# Patient Record
Sex: Female | Born: 1990 | Race: Black or African American | Hispanic: No | Marital: Single | Smoking: Former smoker
Health system: Southern US, Community
[De-identification: ages and names within clinical notes are randomized; demographics above are authoritative.]

## PROBLEM LIST (undated history)

## (undated) ENCOUNTER — Inpatient Hospital Stay (HOSPITAL_COMMUNITY): Payer: Self-pay

## (undated) DIAGNOSIS — T8859XA Other complications of anesthesia, initial encounter: Secondary | ICD-10-CM

## (undated) DIAGNOSIS — F32A Depression, unspecified: Secondary | ICD-10-CM

## (undated) DIAGNOSIS — R000 Tachycardia, unspecified: Secondary | ICD-10-CM

## (undated) DIAGNOSIS — E079 Disorder of thyroid, unspecified: Secondary | ICD-10-CM

## (undated) DIAGNOSIS — E282 Polycystic ovarian syndrome: Secondary | ICD-10-CM

## (undated) DIAGNOSIS — I10 Essential (primary) hypertension: Secondary | ICD-10-CM

## (undated) DIAGNOSIS — G4730 Sleep apnea, unspecified: Secondary | ICD-10-CM

## (undated) DIAGNOSIS — J45909 Unspecified asthma, uncomplicated: Secondary | ICD-10-CM

## (undated) DIAGNOSIS — F419 Anxiety disorder, unspecified: Secondary | ICD-10-CM

## (undated) DIAGNOSIS — J189 Pneumonia, unspecified organism: Secondary | ICD-10-CM

## (undated) DIAGNOSIS — E119 Type 2 diabetes mellitus without complications: Secondary | ICD-10-CM

## (undated) DIAGNOSIS — F329 Major depressive disorder, single episode, unspecified: Secondary | ICD-10-CM

## (undated) HISTORY — PX: TONSILLECTOMY: SUR1361

## (undated) HISTORY — DX: Type 2 diabetes mellitus without complications: E11.9

## (undated) HISTORY — PX: ADENOIDECTOMY: SUR15

## (undated) HISTORY — DX: Polycystic ovarian syndrome: E28.2

## (undated) HISTORY — DX: Disorder of thyroid, unspecified: E07.9

---
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
## 2009-01-05 ENCOUNTER — Ambulatory Visit: Payer: Self-pay | Admitting: Family Medicine

## 2009-01-05 DIAGNOSIS — L538 Other specified erythematous conditions: Secondary | ICD-10-CM | POA: Insufficient documentation

## 2009-01-05 DIAGNOSIS — N644 Mastodynia: Secondary | ICD-10-CM | POA: Insufficient documentation

## 2009-01-05 DIAGNOSIS — F339 Major depressive disorder, recurrent, unspecified: Secondary | ICD-10-CM | POA: Insufficient documentation

## 2009-08-17 ENCOUNTER — Emergency Department (HOSPITAL_COMMUNITY): Admission: EM | Admit: 2009-08-17 | Discharge: 2009-08-17 | Payer: Self-pay | Admitting: Emergency Medicine

## 2009-12-11 ENCOUNTER — Encounter: Payer: Self-pay | Admitting: Family Medicine

## 2010-01-09 ENCOUNTER — Encounter: Payer: Self-pay | Admitting: Family Medicine

## 2010-02-28 NOTE — Miscellaneous (Signed)
Summary: enalapril denied  Clinical Lists Changes Received rx request for enalapril.  As of 12/2008, but was sexually active without contraception.  Will need appt to discuss other options for antihypertensives.  Was also started on Wellbutrin but did not follow up. Will deny enalapril and ask pt to schedule an appt. Sarah Swaziland MD  December 11, 2009 11:17 AM

## 2010-02-28 NOTE — Miscellaneous (Signed)
Summary: enalapril denied  Clinical Lists Changes Receiving continued requests for enalapril.  Last fill was 12/30/08.  Pt must be seen before we can fill this med.  We need to check labs, blood pressure and should also discuss contraception with her before we can fill this medicine. Sarah Swaziland MD  January 09, 2010 8:59 AM

## 2010-03-25 ENCOUNTER — Emergency Department (HOSPITAL_COMMUNITY)
Admission: EM | Admit: 2010-03-25 | Discharge: 2010-03-25 | Disposition: A | Discharge status: Home / Self Care | Payer: BC Managed Care – PPO | Attending: Emergency Medicine | Admitting: Emergency Medicine

## 2010-03-25 ENCOUNTER — Emergency Department (HOSPITAL_COMMUNITY): Payer: BC Managed Care – PPO

## 2010-03-25 DIAGNOSIS — J040 Acute laryngitis: Secondary | ICD-10-CM | POA: Insufficient documentation

## 2010-03-25 DIAGNOSIS — R0989 Other specified symptoms and signs involving the circulatory and respiratory systems: Secondary | ICD-10-CM | POA: Insufficient documentation

## 2010-03-25 DIAGNOSIS — R059 Cough, unspecified: Secondary | ICD-10-CM | POA: Insufficient documentation

## 2010-03-25 DIAGNOSIS — R0602 Shortness of breath: Secondary | ICD-10-CM | POA: Insufficient documentation

## 2010-03-25 DIAGNOSIS — I10 Essential (primary) hypertension: Secondary | ICD-10-CM | POA: Insufficient documentation

## 2010-03-25 DIAGNOSIS — R05 Cough: Secondary | ICD-10-CM | POA: Insufficient documentation

## 2010-03-25 DIAGNOSIS — J3489 Other specified disorders of nose and nasal sinuses: Secondary | ICD-10-CM | POA: Insufficient documentation

## 2010-04-09 ENCOUNTER — Emergency Department (HOSPITAL_COMMUNITY)
Admission: EM | Admit: 2010-04-09 | Discharge: 2010-04-09 | Disposition: A | Discharge status: Home / Self Care | Payer: BC Managed Care – PPO | Attending: Emergency Medicine | Admitting: Emergency Medicine

## 2010-04-09 DIAGNOSIS — M7989 Other specified soft tissue disorders: Secondary | ICD-10-CM | POA: Insufficient documentation

## 2010-04-09 DIAGNOSIS — R609 Edema, unspecified: Secondary | ICD-10-CM | POA: Insufficient documentation

## 2010-04-09 DIAGNOSIS — I10 Essential (primary) hypertension: Secondary | ICD-10-CM | POA: Insufficient documentation

## 2010-04-13 LAB — DIFFERENTIAL
Basophils Relative: 0 % (ref 0–1)
Eosinophils Relative: 0 % (ref 0–5)
Lymphocytes Relative: 26 % (ref 12–46)
Monocytes Absolute: 1.2 10*3/uL — ABNORMAL HIGH (ref 0.1–1.0)
Monocytes Relative: 16 % — ABNORMAL HIGH (ref 3–12)
Neutrophils Relative %: 58 % (ref 43–77)

## 2010-04-13 LAB — POCT I-STAT, CHEM 8
Chloride: 105 mEq/L (ref 96–112)
Creatinine, Ser: 0.9 mg/dL (ref 0.4–1.2)
Glucose, Bld: 98 mg/dL (ref 70–99)
HCT: 42 % (ref 36.0–46.0)
Hemoglobin: 14.3 g/dL (ref 12.0–15.0)
Potassium: 4.1 mEq/L (ref 3.5–5.1)
Sodium: 138 mEq/L (ref 135–145)

## 2010-04-13 LAB — URINE CULTURE
Colony Count: NO GROWTH
Culture: NO GROWTH

## 2010-04-13 LAB — URINALYSIS, ROUTINE W REFLEX MICROSCOPIC
Glucose, UA: NEGATIVE mg/dL
Ketones, ur: NEGATIVE mg/dL
Nitrite: NEGATIVE
Protein, ur: NEGATIVE mg/dL
Urobilinogen, UA: 0.2 mg/dL (ref 0.0–1.0)

## 2010-04-13 LAB — CBC
Hemoglobin: 12.5 g/dL (ref 12.0–15.0)
Platelets: 294 10*3/uL (ref 150–400)
RBC: 5.16 MIL/uL — ABNORMAL HIGH (ref 3.87–5.11)
WBC: 7.2 10*3/uL (ref 4.0–10.5)

## 2010-04-13 LAB — URINE MICROSCOPIC-ADD ON

## 2010-05-03 LAB — CARDIAC PANEL(CRET KIN+CKTOT+MB+TROPI)
CK, MB: 1.1 ng/mL (ref 0.3–4.0)
CK, MB: 1.2 ng/mL (ref 0.3–4.0)
Relative Index: 0.8 (ref 0.0–2.5)
Relative Index: 0.9 (ref 0.0–2.5)
Total CK: 144 U/L (ref 7–177)

## 2010-05-03 LAB — WET PREP, GENITAL
Clue Cells Wet Prep HPF POC: NONE SEEN
Trich, Wet Prep: NONE SEEN

## 2010-05-03 LAB — CBC
HCT: 40 % (ref 36.0–46.0)
Hemoglobin: 12.9 g/dL (ref 12.0–15.0)
MCHC: 31.8 g/dL (ref 30.0–36.0)
MCHC: 32.4 g/dL (ref 30.0–36.0)
MCV: 77.1 fL — ABNORMAL LOW (ref 78.0–100.0)
RDW: 14.5 % (ref 11.5–15.5)
RDW: 14.7 % (ref 11.5–15.5)

## 2010-05-03 LAB — URINALYSIS, ROUTINE W REFLEX MICROSCOPIC
Nitrite: NEGATIVE
Specific Gravity, Urine: 1.023 (ref 1.005–1.030)
Urobilinogen, UA: 0.2 mg/dL (ref 0.0–1.0)
pH: 6.5 (ref 5.0–8.0)

## 2010-05-03 LAB — D-DIMER, QUANTITATIVE: D-Dimer, Quant: 0.45 ug/mL-FEU (ref 0.00–0.48)

## 2010-05-03 LAB — BASIC METABOLIC PANEL
CO2: 24 mEq/L (ref 19–32)
Calcium: 8.3 mg/dL — ABNORMAL LOW (ref 8.4–10.5)
GFR calc Af Amer: >60 mL/min (ref >60)
GFR calc non Af Amer: >60 mL/min (ref >60)
Glucose, Bld: 98 mg/dL (ref 70–99)
Potassium: 3.4 mEq/L — ABNORMAL LOW (ref 3.5–5.1)
Sodium: 133 mEq/L — ABNORMAL LOW (ref 135–145)

## 2010-05-03 LAB — POCT PREGNANCY, URINE: Preg Test, Ur: NEGATIVE

## 2010-05-03 LAB — POCT I-STAT, CHEM 8
Creatinine, Ser: 0.8 mg/dL (ref 0.4–1.2)
Glucose, Bld: 122 mg/dL — ABNORMAL HIGH (ref 70–99)
Hemoglobin: 15 g/dL (ref 12.0–15.0)
Potassium: 4.5 mEq/L (ref 3.5–5.1)

## 2010-05-03 LAB — CK TOTAL AND CKMB (NOT AT ARMC)
Relative Index: 0.6 (ref 0.0–2.5)
Total CK: 174 U/L (ref 7–177)

## 2010-05-03 LAB — DIFFERENTIAL
Basophils Absolute: 0 10*3/uL (ref 0.0–0.1)
Eosinophils Absolute: 0.1 10*3/uL (ref 0.0–0.7)
Eosinophils Relative: 2 % (ref 0–5)
Lymphocytes Relative: 35 % (ref 12–46)
Monocytes Absolute: 0.7 10*3/uL (ref 0.1–1.0)

## 2010-05-03 LAB — URINE MICROSCOPIC-ADD ON

## 2010-05-03 LAB — POCT CARDIAC MARKERS
CKMB, poc: 1.1 ng/mL (ref 1.0–8.0)
Troponin i, poc: <0.05 ng/mL (ref 0.00–0.09)
Troponin i, poc: <0.05 ng/mL (ref 0.00–0.09)

## 2010-05-03 LAB — LIPID PANEL
Cholesterol: 122 mg/dL (ref 0–169)
LDL Cholesterol: 66 mg/dL (ref 0–109)
Triglycerides: 44 mg/dL (ref <150)

## 2010-05-07 LAB — DIFFERENTIAL
Eosinophils Absolute: 0 10*3/uL (ref 0.0–0.7)
Eosinophils Relative: 1 % (ref 0–5)
Lymphocytes Relative: 13 % (ref 12–46)
Lymphs Abs: 0.9 10*3/uL (ref 0.7–4.0)
Monocytes Relative: 9 % (ref 3–12)

## 2010-05-07 LAB — POCT I-STAT, CHEM 8
BUN: 5 mg/dL — ABNORMAL LOW (ref 6–23)
Creatinine, Ser: 0.9 mg/dL (ref 0.4–1.2)
Glucose, Bld: 100 mg/dL — ABNORMAL HIGH (ref 70–99)
Hemoglobin: 13.9 g/dL (ref 12.0–15.0)
TCO2: 23 mmol/L (ref 0–100)

## 2010-05-07 LAB — CBC
HCT: 37.6 % (ref 36.0–46.0)
Hemoglobin: 12.5 g/dL (ref 12.0–15.0)
MCV: 74.9 fL — ABNORMAL LOW (ref 78.0–100.0)
RBC: 5.02 MIL/uL (ref 3.87–5.11)
WBC: 7.4 10*3/uL (ref 4.0–10.5)

## 2010-06-14 NOTE — H&P (Signed)
NAMEINDICA, MARCOTT NO.:  [id number]   MEDICAL RECORD NO.:  [id number]          PATIENT TYPE:  INP   LOCATION:  0103                          [facility]ACILITY:  [facility]   PHYSICIAN:  Lalla Brothers, MDDATE OF BIRTH:  [birth year]   DATE OF ADMISSION:  04/26/2005  DATE OF DISCHARGE:                         PSYCHIATRIC ADMISSION ASSESSMENT   IDENTIFICATION:  This 20 year old female, ninth grade student at [employer], is admitted emergently voluntarily from Access and Intake Crisis at  the [facility] where she was brought by mother for disruptive  behavior and depressed mood and dangerous to the patient and others.  Mother  had contacted the crisis line for advice and, despite constructive  assignments for the interim until next [facility]  appointment, mother brought the patient for further assessment.  The patient  disclosed in the assessment that she was having self-destructive ideation,  progressing to plan to cut her wrist with a knife to commit suicide.  She  did not disclose this to the family until present with the intake counselor  and then did disclose such to mother as well.  The patient has had extremes  of behavior from hopeless giving up to property destruction and shoving her  uncle as well as throwing a chair at her 54-year-old sister.  Although the  family is frightened of the patient, the crisis assessment was frightened  for the patient's self-injurious ideation and intent.   HISTORY OF PRESENT ILLNESS:  The patient has been in therapy with Marcelino Duster  at [facility] for the last month.  She indicates that  they address her loneliness, alienation, and her current obsessive fixation  upon a relationship with a 21 year old female tennant at the residence next  door to the family.  The patient has runaway with this boyfriend figure as  well as running to his residence and then refusing to return home  despite  his insistence.  The patient's first question of me is to ask whether her  boyfriend has been incarcerated.  Apparently, parents may be pursuing  charges for the female's interaction with the patient.  The patient does not  clarify any specific interaction herself.  She does state that she has been  switched from birth control pills to Depo-Provera injections, likely due to  noncompliance and the patient and mother acknowledge that the patient has  been noncompliant the last two nights with her enalapril 5 mg nightly for  hypertension.  The patient is significantly overweight and such along with  other factors has contributed to major medical consequences in the past.  Although tonsillectomy in July of 2002 had hoped to provide the patient  improved upper airway for respiration, the patient apparently experienced  respiratory failure and was air-lifted to [facility] where she states she was in a  coma for two weeks.  The patient may be discussing sedation necessary for  artificial ventilation but she and the family cannot clarify further.  The  patient has weight and other airway dynamic issues that may contribute.  She  was treated for bronchopneumonia in the emergency  department March 26, 2005.  The patient therefore presents numerous risk to pharmacotherapy while  at the same time having significant risk of no treatment.  The patient has  been destroying property at home.  She is threatening others in the home  including with clenched fists, especially as parents prohibit the patient  from seeing the 21 year old man next door.  The patient retaliates and  regresses with suicidality becoming hopeless at the same time that she is  frequently out of control in her behavior.  The patient therefore presents  significant risk in her current mood, agitated behavior and failing family  relations.  She has also had a significant decline in her grades at school  from B's and C's usually to  current F's.  The patient does not acknowledge  definite manic symptoms.  She does not clarify sexual activity status  particularly relative to boyfriend.  The patient is on no medications except  her enalapril and Depo-Provera with enalapril being 5 mg every bedtime.  The  patient reports a 20-pound weight gain recently despite reporting that she  has been eating only one meal daily or less, though this eating pattern  likely predisposes to binge-eating and thereby significant weight gain.  The  patient presents atypical depressive features with hypersensitivity to the  comments or actions of others, rejection sensitivity, easy anger outburst,  overeating in binges, and reactive dysphoria.  The patient presents no  definite manic or psychotic features or diathesis.  She denies the use of  alcohol or illicit drugs.  She has had no other known organic central  nervous system trauma, although she states she was in a coma for two weeks  at [facility] when transferred there for what she considers collapsed lungs  following her tonsillectomy.  The patient does not acknowledge other  specific anxiety nor post-traumatic flashbacks.   PAST MEDICAL HISTORY:  The patient is under the primary care of Dr. Orland Penman  at [facility].  She has longstanding obesity with  significant stria.  She has hypertension, treated currently with enalapril5  mg every bedtime.  She is on Depo-Provera now instead of birth control  pills.  She has mechanical knee pain which may be related to being  overweight.  She had a tonsillectomy in July of 2002, seeming to describe  respiratory failure afterward even though a tonsillectomy was partly for the  indication of improving the upper airway.  The procedure was uneventful  otherwise.  She was in a coma reportedly for two weeks at [facility] which may have  been induced while on mechanical ventilation.  The patient was in the emergency department March 26, 2005 with  complaints of chest pain and  difficulty breathing.  The patient had a chest x-ray at that time that was  largely benign except for some peribronchiolar thickening and apparently was  ultimately concluded to have bronchopneumonia.  Her electrocardiogram  performed at that time as well as a CT scan of the chest apparently revealed  no evidence of pulmonary emboli or other abnormality.  She was treated with  intervenous azithromycin to be followed by oral azithromycin and apparently  did have improvement.  The patient has no medication allergies.  She is on  no other current medical regimen except her enalapril on Depo-Provera.  She  denies seizures or syncope otherwise.  She denies heart murmur or arrhythmia  otherwise.   REVIEW OF SYSTEMS:  The patient denies difficulty with gait, gaze or  continence.  She denies exposure to communicable disease or toxins.  She  denies rash, jaundice or purpura.  There is no chest pain, palpitations or  presyncope.  There is no abdominal pain, nausea, vomiting or diarrhea.  There is no dysuria or arthralgia.  She denies memory difficulties or  coordination difficulties currently.  She has no headache or sensory loss.   IMMUNIZATIONS:  Up-to-date.   FAMILY HISTORY:  The patient lives with both parents and siblings, ages 74  and 41.  She reports support from grandparents.  She perceives that family  may press charges against the 21 year old female living as a tennant in the  residence next door.  The patient and family do not immediately acknowledge  other major psychiatric disorders.   SOCIAL AND DEVELOPMENTAL HISTORY:  The patient is a ninth grade student at  [employer].  High school transition may be stressful.  Grades have  dropped from B's and C's to F's but parents seem to attribute it to her  fixation and obsession with the female next door.  The patient does not  acknowledge alcohol or illicit drug use.  She does not acknowledge  cigarettes.   She denies other legal charges herself.   ASSETS:  The patient can be caring and sensitive.   MENTAL STATUS EXAM:  Height is 65-1/2 inches and weight is 315 pounds.  Blood pressure is 124/83 with heart rate of 100 (sitting).  The patient is  right-handed.  She is alert and oriented with speech intact.  Cranial nerves  2-12 are intact.  Muscle strengths and tone are normal.  AMRs are 0/0.  There are no pathologic reflexes or soft neurologic findings.  There are no  abnormal involuntary movements.  Gait and gaze are intact.  The patient is  most concerned about boyfriend next door becoming incarcerated.  She  acknowledges obsession and fixation with the boyfriend figure but does not  share how or why.  She has significant atypical dysphoria with reactivity to  mood, easy outbursts of anger, impulse dyscontrol and binge overeating, and  hypersensitivity to the comments or actions of others as well as rejection sensitivity.  He has significant secondary oppositional outbursts and  externalization.  She did not present definite impaired concentration or  attention.  She has no misperceptions or delirium.  She has no dissociation  or post-traumatic anxiety.  She has no loose associations or hallucinations.  She has suicide plan to cut her wrist and has been significantly assaultive  and destructive to family and destructive of property.   IMPRESSION:  AXIS I:  Depressive disorder not otherwise specified with  atypical features.  Rule out oppositional defiant disorder (provisional  diagnosis).  Rule out eating disorder not otherwise specified with binge  overeating (provisional diagnosis).  Parent-child problem.  Other specified  family circumstances.  Other interpersonal problem.  AXIS II:  Diagnosis deferred.  AXIS III:  Obesity, hypertension, history of upper airway obstructive  symptoms treated with tonsillectomy, proclivity to respiratory failure and  pneumonia due to above,  Depo-Provera.  AXIS IV:  Stressors:  Family--moderate, acute and chronic; peer relations--  severe to extreme, acute and chronic; school--moderate, acute and chronic;  phase of life--severe, acute and chronic.  AXIS V:  GAF on admission 40; highest in last year estimated at 75.   PLAN:  The patient is admitted for inpatient adolescent psychiatric and  multidisciplinary multimodal behavioral health treatment in a team-based  program at a locked psychiatric unit.  Will be seen for pharmacotherapy if  possible but, if necessary, to use an antidepressant, Wellbutrin or other  activating agent would be essential.  However, will need to monitor blood  pressure and respiratory competence with any medication.  Cognitive  behavioral therapy, anger management, desensitization, family therapy,  object relations therapy, identity consolidation, individuation separation,  and nutrition therapy will be undertaken.  The success of psychotherapy is  important to minimize the medication need and potential side effects.   ESTIMATED LENGTH OF STAY:  Five days with target symptoms for discharge  being stabilization of suicide risk and mood, stabilization of dangerous,  disruptive behavior, and generalization of the capacity for safe, effective  participation in outpatient treatment.      Lalla Brothers, MD  Electronically Signed     GEJ/MEDQ  D:  04/27/2005  T:  04/28/2005  Job:  [id number]

## 2010-06-14 NOTE — Op Note (Signed)
[place]  Patient:    Laura Benson, Laura Benson                     MRN: [id number] Proc. Date: 08/24/00 Attending:  Alfonse Flavors, M.D.                           Operative Report  INDICATIONS/JUSTIFICATIONS FOR PROCEDURE: Laura Benson is a [age]-year-old patient seen in consultation at the request of [facility].  Laura Benson had been seen in the past in March 1998.  At that time she was noted to have tonsillar and adenoid hypertrophy with nocturnal airway obstruction. Tonsillectomy and adenoidectomy was suggested.  Laura Benson continued to have loud snoring at night.  She had choking respirations and intermittent apneic periods.  She was a mouth-breather during the day.  She has obesity.  The physical examination showed 3+ tonsils.  The remainder of the physical examination was normal with the exception of obesity.  Laura Benson was felt to have significant nocturnal airway obstruction.  She was a candidate for tonsillectomy and adenoidectomy, although in view of her obesity she could continue to have nocturnal airway problems.  This was discussed with her mother, who understood that nocturnal airway obstruction could be caused by a number of different factors.  The indications and complications of the procedure were discussed in detail.  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  OPERATION/PROCEDURE: Tonsillectomy and adenoidectomy.  SURGEON: Alfonse Flavors, M.D.  ANESTHESIA: General endotracheal.  DESCRIPTION OF PROCEDURE: Laura Benson was brought to the operating room and placed supine on the operating room table.  She was induced with general anesthesia and intubated with an orotracheal tube.  The face was draped in sterile fashion.  The mouth was opened with a Crowe-Davis mouth gag, the left tonsil grasped with a tenaculum and  retracted medially, and an incision made over the anterior tonsillar pillar with suction cautery, and using a curved Dean knife, curved clamp, and suction cautery the tonsil was dissected free from the tonsillar fossae.  A similar technique was used for removal of the right tonsil.  The tonsillar fossae were debrided with a Kitner dissector and further bleeding areas were cauterized again.  Marcaine 0.50% with 2:100,000 epinephrine was injected circumferentially around the tonsillar fossa, a total of 2 cc of solution used.  The palate was elevated with red rubber catheters under visualization by a mirror, and a large adenoid was removed with adenoid curettes, adenotome, and suction cautery.  Hemostasis was obtained with suction cautery.  The pharynx was suctioned free of debris and a small nasogastric tube passed into the stomach and the gastric contents removed. Laura Benson tolerated the procedure well and was taken to the recovery care unit.  FOLLOW-UP CARE: Laura Benson will be admitted for 23 hour observation for pulmonary edema, tonsillar hemorrhage, and for intravenous hydration and analgesia.  Laura Benson will be seen for follow-up examination in two weeks.  DISCHARGE MEDICATIONS:  1. Amoxicillin.  2. Tylenol with codeine. DD:  08/24/00 TD:  08/24/00 Job: [id number]

## 2010-06-14 NOTE — Discharge Summary (Signed)
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  The patient had not been open with family about these symptoms  prior to arrival but did disclose such in mother's presence as disengagement  from conflicts with family could be temporarily secured.  The patient is  demanding that family not interrupt her relationship with a 21 year old female  who has moved in next door who has the mind of a 21 year old but is  significantly disruptive.  She reports that the 21 year old female has an  uncle who injects heroin and has no other father figure so that she has been  attempting to help his behavior and life at the same time she has fallen in  love with him.  She indicates she has talked with Detective Knott who has  been attempting to intervene and parents are wanting to press charges  against the boyfriend figure.  Whereas the patient sings in the church choir  and is close to the pastor, she has changed significantly to being defiant  and dangerous to others including property destruction, shoving her uncle  and throwing a chair at her 45-year-old sister.  The patient weighs 315  pounds and the family is frightened of her though mother is significantly  heavier than the patient.  For full details please see the typed admission  assessment.   SYNOPSIS OF PRESENT ILLNESS:  The patient has had  outpatient therapy with  Isla Pence at [facility] of the [place] for the last month.  The  patient suggests they work on loneliness, alienation and her current  fixation on the female next door.  The patient has run away with this  boyfriend as well as running to him and refusing to leave him when he  insists.  The patient has been switched from birth control pills to Depo-  Provera injections likely due to noncompliance.  The patient is noncompliant  with her antihypertensive Enalapril 5 mg nightly too often as well.  She had  respiratory failure following tonsillectomy in July2002.  A tonsillectomy  was primarily performed for obstructive airway symptoms.  The patient  indicates that her lungs collapsed but cannot be more specific about the  actual pathophysiology of why she had to be air lifted to [facility] and was in a  coma for 2 weeks.  The patient was treated in the emergency department  March 26, 2005 for bronchopneumonia with negative evaluation at that time  for right heart failure, pulmonary embolus,  or other differential diagnoses.  The patient has not had other symptoms for pulmonary hypertension.  She is  under the primary care of  Dr Alfredo Bach at [facility] family [facility].  Mother  has anxiety.  The patient lives with both parents and sisters ages 57 and  82.  There is maternal family history of diabetes, hypertension and heart  disease.  Paternal grandfather has substance abuse with alcohol.  The  patient's grades have dropped from Bs and Cs currently to [employer].   INITIAL MENTAL STATUS EXAM:  The patient has significant atypical dysphoria  at the time of admission.  She has hypersensitivity to the comments or  actions of others and marked rejection sensitivity.  She has easy outbursts  of anger and impulse dyscontrol including for binge overeating.  Her mood is  reactive with intense dysphoria at times alternating with times of adequate  emotional resource for problem  solving.  However she has become  predominantly fixated in  dysphoria preceding admission.  She is secondary  oppositional outbursts and externalizing destructiveness.  She has no  psychotic symptoms or dissociative symptoms.  She has no manic diathesis  evident clinically.  As she participates in the intake interview and access  and intake crisis, the patient opens up and discloses that she has been  considering suicide with a knife to cut her wrist.   LABORATORY FINDINGS:  CBC was normal except MCV low at 74 with reference  range 78 to 92.  She had a slight monocytosis with 14% monocytes with upper  limit of normal nine and neutrophils were low at 29% with lower limit of  normal 33 so that absolute neutrophil count was 1500 with lower limit of  normal 1600.  White count was normal at 5100, hemoglobin 11.9, reference  range 11-14.6, hematocrit 37 and platelet count 295,000.  Comprehensive  metabolic panel was normal except indirect bilirubin elevated at 1.2 with  upper limit of normal 0.9.  Sodium was normal 141, potassium 3.9, fasting  glucose 98, CO2 24, direct bilirubin 0.3, calcium nine, albumin 3.6, AST 17,  ALT 16 and GGT 23.  10-hour fasting lipid study was normal with total  cholesterol 113, HDL 35, LDL 69 and triglyceride 44.  Free T4 was normal  1.48 and TSH at 0.58.  Serum iron was normal at 40 with reference range 42-  135 but percent saturation was low at 15% with reference range 20-55% with  TIBC 332.  Urine HCG was negative.  Urine drug screen was negative with  creatinine of 344 mg/dL documenting specimen to be adequate.  Urinalysis was  normal except concentrated specimen with specific gravity of 1.031.  RPR was  nonreactive.  Urine probe for gonorrhea and chlamydia trichomatous by DNA  amplification were both negative.   HOSPITAL COURSE AND TREATMENT:  General medical exam by Mallie Darting PA-C  noted previous fractures of the right and left legs.  The patient  reported that she falls asleep in class raising concern for breathing related sleep  disorder.  She has eyeglasses.  She notes some nausea from birth control  pill.  She had menarche at age 84 and menses are regular on birth control  pills.  She has severe obesity.  She is denying any other GYN exam.  Admission height was 65-1/2 inches and weight was 315 pounds.  Initial blood  pressure was 132/85 with heart rate of 83 supine and 157/87 with heart rate  of 90 standing.  Vital signs  were otherwise normal throughout hospital stay,  except standing blood pressure elevated on the day before discharge and  160/92 with heart rate of 93 while supine blood pressure had been 142/68  with heart rate of 81 that day.  On the morning of discharge, supine blood  pressure was 145/78 with heart rate of 84.  Lowest blood pressure was 114/68  with heart rate of 68, three mornings prior to discharge.  The patient's  enalapril was given consistently at 5 mg at bedtime throughout the hospital  stay.  Multivitamin with iron was started and the patient was seen by  nutrition for consultation and education, that could be applied and  implemented during hospital stay and then generalized.  The patient reported  poor eating habits but did express interest in eating more healthily and  losing weight.  General focus was to stop skipping meals and to increase  fruits and milk for dietary history obtained.  The patient was slow to  engage in treatment but did become capable of addressing all issues  including family structure and function, obsessive fixation on boyfriend  next door, and developmental goals and course.  The patient was visited by  pastor as well as family.  The patient wanted to see Detective Knott who was  available to discuss with social work but not to see the patient.  Still  parents and Detective Clydene Pugh were addressing the status of the 21 year old  female next door and how to contain his illegal  behaviors.  Mother in some  ways seemed to expect antidepressant but in other ways acknowledged  noncompliance with antihypertensive.  As the patient gradually began to make  progress in therapy, hope was to abstain from starting antidepressant  considering the patient has had medical complications and multiple arenas  with past treatment.  Weight reduction is most imperative intervention.  The  patient was still controlling in the final family therapy session with  mother and grandmother but gradually worked through Statistician of  accomplishments from inpatient treatment to home.  Still the patient may  benefit from out of home placement if safety cannot be generalized to home  and community.  Wellbutrin would likely be most favorable antidepressant if  needed.  The patient does not have a history of purging or seizures.  Still  the coma she experienced in 2002 is not fully understood.  She is on birth  control pills and anti-hypertensive.  Care management services particularly in structuring aftercare thereby addressed with the patient and family  opportunities for weight reduction camp and program, particularly in the  summer.  The patient's suicidal ideation remitted and she was discharged in  improved condition free of suicidal ideation.  She required no seclusion or  restraint during hospital stay and her aggressiveness gradually subsided so  that she was only resistant by the time of discharge in an oppositional  fashion to mother and grandmother but not aggressive.   FINAL DIAGNOSES:  AXIS I:  1.  Depressive disorder not otherwise specified with atypical features.  2.  Oppositional defiant disorder.  3.  Psychological factors affecting physical condition including obesity      and, hypertension and obstructive respiratory symptoms.  4.  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  Parent child problem.  6.  Other interpersonal problem.  7.  Other  specified family circumstances  8.  Rule out eating disorder not otherwise specified with binge overeating      (provisional diagnosis).  AXIS  II: Diagnosis deferred.  AXIS III:  1.  Obesity.  2.  Hypertension  3.  Respiratory failure of some type following tonsillectomy for obstructive      airway symptoms.  4.  Bronchopneumonia 6 weeks ago.  5.  Depo-Provera  6.  Microcytosis with low iron saturation  AXIS IV: Stressors family moderate acute and chronic; peer relations extreme  acute; school moderate acute; phase of life severe acute and chronic;  medical moderate to severe acute and chronic  AXIS V: Global assessment of functioning on admission 40 with highest in  last year estimated at 75 and discharge global assessment of functioning was  51.   PLAN:  The patient was discharged to mother in improved condition free of  suicidal ideation.  She follows a weight control diet as per nutrition  consult and is considering with the family summer camp and treatment program  for her obesity.  She has no immediate restrictions on activity but has had  problems in the past, particularly when inactive particularly around  surgery.  Crisis and safety plans are outlined if needed.  She is discharged  on her home medications including Enalapril 5 mg every bedtime quantity #30  prescribed, multivitamin with iron daily over-the-counter, and Depo-Provera  already underway.  She continues psychotherapy including with family at  [facility] of the [place] with appointment with Isla Pence on  May 14, 2005 at 1600.  She has ongoing medical care at [facility] family  practice.  Detective Clydene Pugh continues to work with parents at the community  level for protective interventions for [place] from the 21 year old  boyfriend next door, and Fermina is more accepting of these at the time of  discharge.      Lalla Brothers, MD  Electronically Signed     GEJ/MEDQ  D:  05/05/2005  T:   05/05/2005  Job:  [id number]

## 2010-07-23 ENCOUNTER — Inpatient Hospital Stay (INDEPENDENT_AMBULATORY_CARE_PROVIDER_SITE_OTHER)
Admission: RE | Admit: 2010-07-23 | Discharge: 2010-07-23 | Disposition: A | Discharge status: Home / Self Care | Payer: BC Managed Care – PPO | Source: Ambulatory Visit | Attending: Emergency Medicine | Admitting: Emergency Medicine

## 2010-07-23 DIAGNOSIS — N760 Acute vaginitis: Secondary | ICD-10-CM

## 2010-07-23 DIAGNOSIS — A499 Bacterial infection, unspecified: Secondary | ICD-10-CM

## 2010-07-23 DIAGNOSIS — B9689 Other specified bacterial agents as the cause of diseases classified elsewhere: Secondary | ICD-10-CM

## 2010-07-23 LAB — WET PREP, GENITAL

## 2010-07-23 LAB — POCT URINALYSIS DIP (DEVICE)
Ketones, ur: NEGATIVE mg/dL
Nitrite: NEGATIVE
Protein, ur: 100 mg/dL — AB
pH: 5.5 (ref 5.0–8.0)

## 2010-07-24 LAB — URINE CULTURE: Culture  Setup Time: 201206261715

## 2010-07-24 LAB — RPR: RPR Ser Ql: NONREACTIVE

## 2010-07-24 LAB — HIV ANTIBODY (ROUTINE TESTING W REFLEX): HIV: NONREACTIVE

## 2010-07-24 LAB — GC/CHLAMYDIA PROBE AMP, GENITAL
Chlamydia, DNA Probe: POSITIVE — AB
GC Probe Amp, Genital: NEGATIVE

## 2010-07-27 ENCOUNTER — Emergency Department (HOSPITAL_COMMUNITY)
Admission: EM | Admit: 2010-07-27 | Discharge: 2010-07-28 | Disposition: A | Discharge status: Home / Self Care | Payer: BC Managed Care – PPO | Attending: Emergency Medicine | Admitting: Emergency Medicine

## 2010-07-27 DIAGNOSIS — I10 Essential (primary) hypertension: Secondary | ICD-10-CM | POA: Insufficient documentation

## 2010-07-27 DIAGNOSIS — R51 Headache: Secondary | ICD-10-CM | POA: Insufficient documentation

## 2010-07-28 LAB — URINALYSIS, ROUTINE W REFLEX MICROSCOPIC
Nitrite: NEGATIVE
Protein, ur: NEGATIVE mg/dL
Specific Gravity, Urine: 1.025 (ref 1.005–1.030)
Urobilinogen, UA: 0.2 mg/dL (ref 0.0–1.0)

## 2010-07-28 LAB — URINE MICROSCOPIC-ADD ON

## 2011-02-24 ENCOUNTER — Encounter (HOSPITAL_COMMUNITY): Payer: Self-pay | Admitting: *Deleted

## 2011-02-24 ENCOUNTER — Emergency Department (HOSPITAL_COMMUNITY)
Admission: EM | Admit: 2011-02-24 | Discharge: 2011-02-24 | Disposition: A | Discharge status: Home / Self Care | Payer: BC Managed Care – PPO | Attending: Emergency Medicine | Admitting: Emergency Medicine

## 2011-02-24 ENCOUNTER — Emergency Department (HOSPITAL_COMMUNITY): Payer: BC Managed Care – PPO

## 2011-02-24 DIAGNOSIS — J40 Bronchitis, not specified as acute or chronic: Secondary | ICD-10-CM | POA: Insufficient documentation

## 2011-02-24 DIAGNOSIS — R000 Tachycardia, unspecified: Secondary | ICD-10-CM | POA: Insufficient documentation

## 2011-02-24 DIAGNOSIS — R059 Cough, unspecified: Secondary | ICD-10-CM | POA: Insufficient documentation

## 2011-02-24 DIAGNOSIS — R0602 Shortness of breath: Secondary | ICD-10-CM | POA: Insufficient documentation

## 2011-02-24 DIAGNOSIS — G43909 Migraine, unspecified, not intractable, without status migrainosus: Secondary | ICD-10-CM | POA: Insufficient documentation

## 2011-02-24 DIAGNOSIS — R05 Cough: Secondary | ICD-10-CM | POA: Insufficient documentation

## 2011-02-24 HISTORY — DX: Essential (primary) hypertension: I10

## 2011-02-24 MED ORDER — BENZONATATE 100 MG PO CAPS
100.0000 mg | ORAL_CAPSULE | Freq: Three times a day (TID) | ORAL | Status: DC | PRN
  Administered 2011-02-24: 100 mg via ORAL
  Filled 2011-02-24: qty 1

## 2011-02-24 MED ORDER — PROMETHAZINE HCL 25 MG/ML IJ SOLN
25.0000 mg | Freq: Once | INTRAMUSCULAR | Status: AC
  Administered 2011-02-24: 25 mg via INTRAVENOUS
  Filled 2011-02-24: qty 1

## 2011-02-24 MED ORDER — KETOROLAC TROMETHAMINE 60 MG/2ML IM SOLN
60.0000 mg | Freq: Once | INTRAMUSCULAR | Status: AC
  Administered 2011-02-24: 60 mg via INTRAMUSCULAR
  Filled 2011-02-24: qty 2

## 2011-02-24 MED ORDER — PROMETHAZINE HCL 25 MG/ML IJ SOLN
25.0000 mg | Freq: Once | INTRAMUSCULAR | Status: AC
  Administered 2011-02-24: 25 mg via INTRAMUSCULAR
  Filled 2011-02-24: qty 1

## 2011-02-24 NOTE — ED Provider Notes (Cosign Needed)
History     CSN: [id number]  Arrival date & time 02/24/11  1016   First MD Initiated Contact with Patient 02/24/11 1044      Chief Complaint  Patient presents with  . URI  . Headache    (Consider location/radiation/quality/duration/timing/severity/associated sxs/prior treatment) Patient is a 21 y.o. female presenting with URI and headaches. The history is provided by the patient.  URI The primary symptoms include headaches, sore throat, cough and myalgias. Primary symptoms do not include fever, abdominal pain, nausea, vomiting or rash.  The headache is associated with photophobia. The headache is not associated with weakness.  The myalgias are not associated with weakness.  Symptoms associated with the illness include congestion. The illness is not associated with chills or rhinorrhea.  Headache  Associated symptoms include shortness of breath. Pertinent negatives include no fever, no palpitations, no nausea and no vomiting.   the patient is a 21 year old morbidly obese female, with a history of hypertension.  She presents to the emergency department complaining of nasal congestion, sore throat, productive cough with yellow sputum, and shortness of breath.  She denies nausea, vomiting, fevers, chills.  She does have myalgias.  She also complains of a throbbing left-sided headache with photophobia.  She denies fever, rash, neck pain, weakness, or paresthesias.  She does not smoke cigarettes.  Past Medical History  Diagnosis Date  . Hypertension     Past Surgical History  Procedure Date  . Tonsillectomy   . Adenoidectomy     No family history on file.  History  Substance Use Topics  . Smoking status: Former Games developer  . Smokeless tobacco: Not on file  . Alcohol Use: Yes     rarely    OB History    Grav Para Term Preterm Abortions TAB SAB Ect Mult Living                  Review of Systems  Constitutional: Negative for fever and chills.  HENT: Positive for congestion  and sore throat. Negative for rhinorrhea.   Eyes: Positive for photophobia.  Respiratory: Positive for cough and shortness of breath.   Cardiovascular: Negative for chest pain and palpitations.  Gastrointestinal: Negative for nausea, vomiting and abdominal pain.  Genitourinary: Negative for dysuria.  Musculoskeletal: Positive for myalgias.  Skin: Negative for rash.  Neurological: Positive for headaches. Negative for weakness and light-headedness.  Psychiatric/Behavioral: Negative for confusion.  All other systems reviewed and are negative.    Allergies  Review of patient's allergies indicates no known allergies.  Home Medications   Current Outpatient Rx  Name Route Sig Dispense Refill  . CLOTRIMAZOLE-BETAMETHASONE 1-0.05 % EX CREA  Apply under breasts daily, 60 GM       Pulse 111  Temp(Src) 98.7 F (37.1 C) (Oral)  Resp 20  Wt 435 lb (197.315 kg)  SpO2 100%  LMP 01/01/2011  Physical Exam  Constitutional: She is oriented to person, place, and time. No distress.       Morbidly obese  HENT:  Head: Normocephalic and atraumatic.  Mouth/Throat: Oropharynx is clear and moist. No oropharyngeal exudate.  Eyes: Conjunctivae and EOM are normal. Pupils are equal, round, and reactive to light.  Neck: Normal range of motion. Neck supple.       No nuchal rigidity  Cardiovascular: Regular rhythm.   No murmur heard.      Tachycardia  Pulmonary/Chest: Effort normal and breath sounds normal.  Abdominal: Soft. Bowel sounds are normal. There is no tenderness.  Musculoskeletal: Normal  range of motion.  Lymphadenopathy:    She has no cervical adenopathy.  Neurological: She is alert and oriented to person, place, and time. No cranial nerve deficit. Coordination normal.  Skin: Skin is warm and dry. No rash noted.  Psychiatric: She has a normal mood and affect. Judgment and thought content normal.    ED Course  Procedures (including critical care time) 21 year old morbidly obese female,  with migraine and URI type symptoms.  We will treat her with promethazine, and Toradol for her headache, now, perform a chest x-ray, since she does have a productive cough.  Labs Reviewed - No data to display No results found.   No diagnosis found.  1:23 PM Migraine, resolved.  MDM  Migraine headache- no signs of systemic illness.  CNS infection.  Neurological deficit or altered mental status.  Headache, resolved in the emergency department. Bronchitis.  No respiratory distress, hypoxia or pneumonia        Nicholes Stairs, MD 02/24/11 1323

## 2011-02-24 NOTE — ED Notes (Signed)
Patient transported to X-ray 

## 2011-02-24 NOTE — ED Notes (Signed)
Pt states "I've been having the headache, sore throat and hurting all over x 1 wk, I thought I was getting better"

## 2011-09-25 ENCOUNTER — Encounter (HOSPITAL_COMMUNITY): Payer: Self-pay

## 2011-09-25 ENCOUNTER — Emergency Department (HOSPITAL_COMMUNITY)
Admission: EM | Admit: 2011-09-25 | Discharge: 2011-09-26 | Disposition: A | Discharge status: Home / Self Care | Payer: BC Managed Care – PPO | Attending: Emergency Medicine | Admitting: Emergency Medicine

## 2011-09-25 DIAGNOSIS — Z87891 Personal history of nicotine dependence: Secondary | ICD-10-CM | POA: Insufficient documentation

## 2011-09-25 DIAGNOSIS — I10 Essential (primary) hypertension: Secondary | ICD-10-CM | POA: Insufficient documentation

## 2011-09-25 DIAGNOSIS — R071 Chest pain on breathing: Secondary | ICD-10-CM | POA: Insufficient documentation

## 2011-09-25 DIAGNOSIS — R0781 Pleurodynia: Secondary | ICD-10-CM

## 2011-09-25 LAB — CBC
HCT: 38.7 % (ref 36.0–46.0)
Hemoglobin: 12.7 g/dL (ref 12.0–15.0)
MCH: 24.1 pg — ABNORMAL LOW (ref 26.0–34.0)
MCHC: 32.8 g/dL (ref 30.0–36.0)
MCV: 73.4 fL — ABNORMAL LOW (ref 78.0–100.0)
RDW: 14.6 % (ref 11.5–15.5)

## 2011-09-25 LAB — BASIC METABOLIC PANEL
BUN: 9 mg/dL (ref 6–23)
Creatinine, Ser: 0.91 mg/dL (ref 0.50–1.10)
GFR calc non Af Amer: 90 mL/min — ABNORMAL LOW (ref >90)
Glucose, Bld: 99 mg/dL (ref 70–99)
Potassium: 3.6 mEq/L (ref 3.5–5.1)

## 2011-09-25 LAB — URINALYSIS, ROUTINE W REFLEX MICROSCOPIC
Bilirubin Urine: NEGATIVE
Glucose, UA: NEGATIVE mg/dL
Hgb urine dipstick: NEGATIVE
Nitrite: NEGATIVE
Specific Gravity, Urine: 1.036 — ABNORMAL HIGH (ref 1.005–1.030)
pH: 5.5 (ref 5.0–8.0)

## 2011-09-25 LAB — POCT I-STAT TROPONIN I

## 2011-09-25 LAB — POCT PREGNANCY, URINE: Preg Test, Ur: NEGATIVE

## 2011-09-25 MED ORDER — AMOXICILLIN-POT CLAVULANATE 875-125 MG PO TABS: 1.0000 | ORAL_TABLET | Freq: Once | ORAL | Status: DC

## 2011-09-25 NOTE — ED Provider Notes (Signed)
History     CSN: [id number]  Arrival date & time 09/25/11  Laura Benson   First MD Initiated Contact with Patient 09/25/11 2320      Chief Complaint  Patient presents with  . Chest Pain  . Abdominal Pain    (Consider location/radiation/quality/duration/timing/severity/associated sxs/prior treatment) HPI Comments: 21 year old female history of morbid obesity and hypertension who presents with a complaint of 2 weeks of a sharp chest pain which is worse at night, worse in the recumbent position and as of today became worse with deep breathing. It is sharp and stabbing, mid chest to the left side, associated with radiation to the back. She denies nausea vomiting shortness of breath cough fevers chills swelling of her legs rashes or diarrhea. She has no recent travel, no trauma, no recent surgery, she is not take hormone therapy. She has no history of hypercoagulable state nor has she ever had a venous thromboembolism.  Patient is a 21 y.o. female presenting with chest pain and abdominal pain. The history is provided by the patient.  Chest Pain Primary symptoms include abdominal pain. Pertinent negatives for primary symptoms include no fever, no shortness of breath, no cough, no nausea and no vomiting.    Abdominal Pain The primary symptoms of the illness include abdominal pain. The primary symptoms of the illness do not include fever, shortness of breath, nausea, vomiting, diarrhea or dysuria.  Additional symptoms associated with the illness include back pain. Symptoms associated with the illness do not include chills.    Past Medical History  Diagnosis Date  . Hypertension     Past Surgical History  Procedure Date  . Tonsillectomy   . Adenoidectomy     History reviewed. No pertinent family history.  History  Substance Use Topics  . Smoking status: Former Games developer  . Smokeless tobacco: Not on file  . Alcohol Use: Yes     rarely    OB History    Grav Para Term Preterm Abortions TAB  SAB Ect Mult Living                  Review of Systems  Constitutional: Negative for fever and chills.  HENT: Negative for mouth sores and neck pain.   Eyes: Negative for visual disturbance.  Respiratory: Negative for cough and shortness of breath.   Cardiovascular: Positive for chest pain.  Gastrointestinal: Positive for abdominal pain. Negative for nausea, vomiting and diarrhea.  Genitourinary: Negative for dysuria.  Musculoskeletal: Positive for back pain. Negative for joint swelling.  Skin: Negative for rash.  Neurological: Negative for headaches.  Hematological: Negative for adenopathy.    Allergies  Review of patient's allergies indicates no known allergies.  Home Medications   Current Outpatient Rx  Name Route Sig Dispense Refill  . NAPROXEN 500 MG PO TABS Oral Take 1 tablet (500 mg total) by mouth 2 (two) times daily with a meal. 30 tablet 0    BP 151/92  Pulse 103  Temp 98.4 F (36.9 C) (Oral)  Resp 20  SpO2 99%  LMP 06/19/2011  Physical Exam  Constitutional: She appears well-developed and well-nourished. No distress.  HENT:  Head: Normocephalic and atraumatic.  Mouth/Throat: Oropharynx is clear and moist.  Eyes: Conjunctivae are normal. No scleral icterus.  Neck: Normal range of motion. Neck supple. No thyromegaly present.  Cardiovascular: Normal rate, regular rhythm, normal heart sounds and intact distal pulses.   No murmur heard.      No JVD, strong pulses at radial arteries  Pulmonary/Chest: Effort  normal and breath sounds normal. No respiratory distress. She has no wheezes. She has no rales.  Abdominal: Soft. Bowel sounds are normal. She exhibits no distension. There is no tenderness. There is no rebound.  Musculoskeletal: Normal range of motion. She exhibits no edema and no tenderness.  Lymphadenopathy:    She has no cervical adenopathy.  Neurological: She is alert. Coordination normal.  Skin: Skin is warm and dry. No rash noted. She is not  diaphoretic. No erythema.    ED Course  Procedures (including critical care time)  Labs Reviewed  CBC - Abnormal; Notable for the following:    RBC 5.27 (*)     MCV 73.4 (*)     MCH 24.1 (*)     All other components within normal limits  BASIC METABOLIC PANEL - Abnormal; Notable for the following:    GFR calc non Af Amer 90 (*)     All other components within normal limits  URINALYSIS, ROUTINE W REFLEX MICROSCOPIC - Abnormal; Notable for the following:    APPearance CLOUDY (*)     Specific Gravity, Urine 1.036 (*)     Ketones, ur TRACE (*)     All other components within normal limits  POCT I-STAT TROPONIN I  POCT PREGNANCY, URINE  D-DIMER, QUANTITATIVE   Dg Chest 2 View  09/26/2011  *RADIOLOGY REPORT*  Clinical Data: Chest pain.  CHEST - 2 VIEW  Comparison: PA and lateral chest 02/24/2011.  Findings: Lungs are clear.  Heart size is normal.  No pneumothorax or pleural fluid.  IMPRESSION: Negative chest.   Original Report Authenticated By: Bernadene Bell. D'ALESSIO, M.D.      1. Pleuritic chest pain       MDM  Pt has CP which has been present for 2 weeks but much worse There is no acute findings on ECG though there is non specific T wave changes.  She has been a smoker and is morbidly obese with a sedentary lifestyle and thus I would consider pulmonary embolism. She would be low risk thus I will get a d-dimer.  ED ECG REPORT  I personally interpreted this EKG   Date: 09/26/2011   Rate: 96  Rhythm: normal sinus rhythm  QRS Axis: normal  Intervals: normal  ST/T Wave abnormalities: nonspecific T wave changes  Conduction Disutrbances:none  Narrative Interpretation:   Old EKG Reviewed: none available  D-dimer negative, patient stable for discharge, doubt cardiac source.    Laura Roller, MD 09/26/11 [phone]

## 2011-09-25 NOTE — ED Notes (Signed)
Miller, MD at bedside.  

## 2011-09-25 NOTE — ED Notes (Signed)
Pt states chest pain started today.  Abdominal pain x 2 weeks that radiates to back.  Similar to previous episode of chest pain.  Vitals stable

## 2011-09-26 ENCOUNTER — Emergency Department (HOSPITAL_COMMUNITY): Payer: BC Managed Care – PPO

## 2011-09-26 LAB — D-DIMER, QUANTITATIVE: D-Dimer, Quant: 0.31 ug/mL-FEU (ref 0.00–0.48)

## 2011-09-26 MED ORDER — NAPROXEN 500 MG PO TABS: 500.0000 mg | ORAL_TABLET | Freq: Two times a day (BID) | ORAL | Status: DC

## 2011-09-26 NOTE — ED Notes (Signed)
Patient transported to X-ray 

## 2012-02-03 ENCOUNTER — Encounter (HOSPITAL_COMMUNITY): Payer: Self-pay

## 2012-02-03 ENCOUNTER — Emergency Department (HOSPITAL_COMMUNITY)
Admission: EM | Admit: 2012-02-03 | Discharge: 2012-02-03 | Disposition: A | Discharge status: Home / Self Care | Payer: BC Managed Care – PPO | Attending: Emergency Medicine | Admitting: Emergency Medicine

## 2012-02-03 DIAGNOSIS — R3589 Other polyuria: Secondary | ICD-10-CM | POA: Insufficient documentation

## 2012-02-03 DIAGNOSIS — I10 Essential (primary) hypertension: Secondary | ICD-10-CM | POA: Insufficient documentation

## 2012-02-03 DIAGNOSIS — R112 Nausea with vomiting, unspecified: Secondary | ICD-10-CM | POA: Insufficient documentation

## 2012-02-03 DIAGNOSIS — R358 Other polyuria: Secondary | ICD-10-CM | POA: Insufficient documentation

## 2012-02-03 DIAGNOSIS — R350 Frequency of micturition: Secondary | ICD-10-CM | POA: Insufficient documentation

## 2012-02-03 DIAGNOSIS — F17200 Nicotine dependence, unspecified, uncomplicated: Secondary | ICD-10-CM | POA: Insufficient documentation

## 2012-02-03 DIAGNOSIS — R197 Diarrhea, unspecified: Secondary | ICD-10-CM | POA: Insufficient documentation

## 2012-02-03 DIAGNOSIS — E669 Obesity, unspecified: Secondary | ICD-10-CM | POA: Insufficient documentation

## 2012-02-03 DIAGNOSIS — N926 Irregular menstruation, unspecified: Secondary | ICD-10-CM | POA: Insufficient documentation

## 2012-02-03 LAB — CBC WITH DIFFERENTIAL/PLATELET
Eosinophils Relative: 2 % (ref 0–5)
MCH: 23.7 pg — ABNORMAL LOW (ref 26.0–34.0)
Monocytes Absolute: 0.5 10*3/uL (ref 0.1–1.0)
Neutrophils Relative %: 53 % (ref 43–77)
Platelets: 311 10*3/uL (ref 150–400)
RBC: 5.45 MIL/uL — ABNORMAL HIGH (ref 3.87–5.11)
WBC: 5.8 10*3/uL (ref 4.0–10.5)

## 2012-02-03 LAB — COMPREHENSIVE METABOLIC PANEL
ALT: 20 U/L (ref 0–35)
AST: 18 U/L (ref 0–37)
CO2: 28 mEq/L (ref 19–32)
Calcium: 9.2 mg/dL (ref 8.4–10.5)
GFR calc non Af Amer: >90 mL/min (ref >90)
Sodium: 136 mEq/L (ref 135–145)
Total Protein: 7.5 g/dL (ref 6.0–8.3)

## 2012-02-03 LAB — URINALYSIS, ROUTINE W REFLEX MICROSCOPIC
Bilirubin Urine: NEGATIVE
Glucose, UA: NEGATIVE mg/dL
Hgb urine dipstick: NEGATIVE
Specific Gravity, Urine: 1.019 (ref 1.005–1.030)
pH: 6.5 (ref 5.0–8.0)

## 2012-02-03 NOTE — ED Notes (Signed)
Pt c/o constant lower abdominal pain for past [redacted] weeks along with hourly urination without any smell or burning sensation.  Similar s/s occurred  In Oct. 2013.  Pt reports being unable to make it to the restroom in time at night.   Pt took a pregnancy test in Oct '13 and 2 weeks ago- all tests resulted negative.  Reports nausea, 2 episodes of vomiting and 3 episodes of diarrhea today.  Pt reports being unable to eat a whole meal, fatigue, abdominal cramping x1 and sharp intermittent breast pain. LMP was Sept. 26, 2013.

## 2012-02-03 NOTE — ED Notes (Signed)
Pt states for the past 3 weeks she's had urinary freq, denies burning, sharp lower intermittent abdominal pains, 3 episodes of diarrhea today, nausea and vomiting when she smells coffee at work. Pt states last period was in September, states sometimes she has irregular periods and sometimes they are on time, pt has taken multiple pregnancy tests and have been negative but states her mother is obese also and she did not find out she was pregnant until 5 months. Pt states she thinks she has diabetes.

## 2012-02-03 NOTE — ED Provider Notes (Signed)
History     CSN: [id number]  Arrival date & time 02/03/12  7829   First MD Initiated Contact with Patient 02/03/12 2104      Chief Complaint  Patient presents with  . Abdominal Pain    Lower abdominal pain    (Consider location/radiation/quality/duration/timing/severity/associated sxs/prior treatment) HPI Comments: Ms. Cuthrell presents ambulatory for evaluation of an increased urinary frequency and urgency over the last 2 months.  Her mother is a type II diabetic and she is concerned she may also be developing diabetes.  She reports being uncomfortable with her lifelong obesity.  She has considering programs to help her with weight loss and making changes to her diet and lifestyle.  She denies fever, NVD, hematuria, pyuria, flank/back pain, abnormal discharges, and vaginal bleeding.  She reports having irregular periods and states she has not had one since 08/2011.  She reports taking pregnancy tests 3 times over the last month and all have been negative.  Patient is a 22 y.o. female presenting with abdominal pain. The history is provided by the patient. No language interpreter was used.  Abdominal Pain The primary symptoms of the illness include abdominal pain. The primary symptoms of the illness do not include fever, fatigue, shortness of breath, nausea, vomiting, diarrhea, hematemesis, hematochezia or dysuria. The onset of the illness was gradual. The problem has not changed since onset. The patient states that she believes she is currently not pregnant. The patient has not had a change in bowel habit. Additional symptoms associated with the illness include urgency and frequency. Symptoms associated with the illness do not include chills, anorexia, diaphoresis, heartburn, constipation, hematuria or back pain.    Past Medical History  Diagnosis Date  . Hypertension     Past Surgical History  Procedure Date  . Tonsillectomy   . Adenoidectomy     Family History  Problem Relation Age  of Onset  . Hypertension Mother   . Diabetes Mother     History  Substance Use Topics  . Smoking status: Current Some Day Smoker  . Smokeless tobacco: Never Used  . Alcohol Use: Yes     Comment: rarely, special occasions    OB History    Grav Para Term Preterm Abortions TAB SAB Ect Mult Living                  Review of Systems  Constitutional: Negative for fever, chills, diaphoresis and fatigue.  Respiratory: Negative for shortness of breath.   Gastrointestinal: Positive for abdominal pain. Negative for heartburn, nausea, vomiting, diarrhea, constipation, hematochezia, anorexia and hematemesis.  Genitourinary: Positive for urgency and frequency. Negative for dysuria and hematuria.  Musculoskeletal: Negative for back pain.  All other systems reviewed and are negative.    Allergies  Review of patient's allergies indicates no known allergies.  Home Medications  No current outpatient prescriptions on file.  BP 149/84  Pulse 76  Temp 99 F (37.2 C)  Resp 20  SpO2 100%  LMP 10/23/2011  Physical Exam  Nursing note and vitals reviewed. Constitutional: She is oriented to person, place, and time. She appears well-developed and well-nourished. No distress.       Pt is morbidly obese  HENT:  Head: Normocephalic and atraumatic.  Right Ear: External ear normal.  Left Ear: External ear normal.  Nose: Nose normal.  Mouth/Throat: Oropharynx is clear and moist. No oropharyngeal exudate.  Eyes: Conjunctivae normal are normal. Pupils are equal, round, and reactive to light. Right eye exhibits no discharge. Left  eye exhibits no discharge. No scleral icterus.  Neck: Normal range of motion. Neck supple. No JVD present. No tracheal deviation present.  Cardiovascular: Normal rate, regular rhythm, normal heart sounds and intact distal pulses.  Exam reveals no gallop and no friction rub.   No murmur heard. Pulmonary/Chest: Effort normal and breath sounds normal. No stridor. No  respiratory distress. She has no wheezes. She has no rales. She exhibits no tenderness.  Abdominal: Soft. Bowel sounds are normal. She exhibits no distension and no mass. There is no tenderness. There is no rebound and no guarding.  Musculoskeletal: Normal range of motion. She exhibits no edema and no tenderness.  Lymphadenopathy:    She has no cervical adenopathy.  Neurological: She is alert and oriented to person, place, and time. No cranial nerve deficit.  Skin: Skin is warm and dry. No rash noted. She is not diaphoretic. No erythema. No pallor.  Psychiatric: She has a normal mood and affect. Her behavior is normal.    ED Course  Procedures (including critical care time)  Labs Reviewed  CBC WITH DIFFERENTIAL - Abnormal; Notable for the following:    RBC 5.45 (*)     MCV 73.9 (*)     MCH 23.7 (*)     All other components within normal limits  COMPREHENSIVE METABOLIC PANEL - Abnormal; Notable for the following:    Glucose, Bld 119 (*)     All other components within normal limits  URINALYSIS, ROUTINE W REFLEX MICROSCOPIC - Abnormal; Notable for the following:    APPearance CLOUDY (*)     All other components within normal limits   No results found.   No diagnosis found.    MDM  Pt presents for evaluation of frequent urination.  She is concerned she may have type II diabetes.  She appears nontoxic, NAD.  Routine labs obtained during the triage process are essentially normal.  She has no anemia or leukocytosis.  BMP and LFTs are wnl.  The urinalysis demonstrates no evidence of infection and there was no noted glucose in the urine.  Pt appears comfortable and has no abdominal discomfort.  Plan discharge home to follow-up closely with aprimary physician/gyn        Tobin Chad, MD 02/03/12 2251

## 2012-02-03 NOTE — ED Notes (Signed)
Attempted IV access x2, unsuccessful.

## 2012-05-06 ENCOUNTER — Emergency Department (HOSPITAL_COMMUNITY)
Admission: EM | Admit: 2012-05-06 | Discharge: 2012-05-06 | Disposition: A | Discharge status: Home / Self Care | Payer: BC Managed Care – PPO | Attending: Emergency Medicine | Admitting: Emergency Medicine

## 2012-05-06 ENCOUNTER — Encounter (HOSPITAL_COMMUNITY): Payer: Self-pay | Admitting: Family Medicine

## 2012-05-06 DIAGNOSIS — N898 Other specified noninflammatory disorders of vagina: Secondary | ICD-10-CM | POA: Insufficient documentation

## 2012-05-06 DIAGNOSIS — I10 Essential (primary) hypertension: Secondary | ICD-10-CM | POA: Insufficient documentation

## 2012-05-06 DIAGNOSIS — N920 Excessive and frequent menstruation with regular cycle: Secondary | ICD-10-CM | POA: Insufficient documentation

## 2012-05-06 DIAGNOSIS — F17200 Nicotine dependence, unspecified, uncomplicated: Secondary | ICD-10-CM | POA: Insufficient documentation

## 2012-05-06 DIAGNOSIS — F430 Acute stress reaction: Secondary | ICD-10-CM | POA: Insufficient documentation

## 2012-05-06 DIAGNOSIS — N921 Excessive and frequent menstruation with irregular cycle: Secondary | ICD-10-CM

## 2012-05-06 DIAGNOSIS — Z3202 Encounter for pregnancy test, result negative: Secondary | ICD-10-CM | POA: Insufficient documentation

## 2012-05-06 DIAGNOSIS — R109 Unspecified abdominal pain: Secondary | ICD-10-CM | POA: Insufficient documentation

## 2012-05-06 LAB — CBC
Hemoglobin: 13 g/dL (ref 12.0–15.0)
MCH: 23.9 pg — ABNORMAL LOW (ref 26.0–34.0)
MCV: 74 fL — ABNORMAL LOW (ref 78.0–100.0)
RBC: 5.43 MIL/uL — ABNORMAL HIGH (ref 3.87–5.11)
WBC: 6.8 10*3/uL (ref 4.0–10.5)

## 2012-05-06 LAB — URINALYSIS, ROUTINE W REFLEX MICROSCOPIC
Glucose, UA: NEGATIVE mg/dL
Specific Gravity, Urine: 1.021 (ref 1.005–1.030)

## 2012-05-06 LAB — WET PREP, GENITAL
Trich, Wet Prep: NONE SEEN
Yeast Wet Prep HPF POC: NONE SEEN

## 2012-05-06 LAB — PREGNANCY, URINE: Preg Test, Ur: NEGATIVE

## 2012-05-06 LAB — URINE MICROSCOPIC-ADD ON

## 2012-05-06 MED ORDER — NAPROXEN 500 MG PO TABS: 500.0000 mg | ORAL_TABLET | Freq: Two times a day (BID) | ORAL | Status: DC

## 2012-05-06 MED ORDER — NAPROXEN 500 MG PO TABS
500.0000 mg | ORAL_TABLET | Freq: Once | ORAL | Status: AC
  Administered 2012-05-06: 500 mg via ORAL
  Filled 2012-05-06: qty 1

## 2012-05-06 NOTE — ED Notes (Signed)
Patient states that she has had lower abdominal pain and vaginal bleeding for approx 2 weeks. States that she is bleeding thru her clothes and changing pads every 3 hours. States she's also passing clots.

## 2012-05-06 NOTE — ED Notes (Signed)
Pt states she has had vaginal bleeding x 2 weeks. Pt states she does not have regular periods, that she only has a period every 5 months or so. Pt also c/o LLQ abd pain x 1 week.

## 2012-05-06 NOTE — ED Provider Notes (Signed)
History     CSN: [id number]  Arrival date & time 05/06/12  0022   First MD Initiated Contact with Patient 05/06/12 0118      Chief Complaint  Patient presents with  . Abdominal Pain  . Vaginal Bleeding    (Consider location/radiation/quality/duration/timing/severity/associated sxs/prior treatment) HPI Laura Benson is a 22 y.o. female who is presenting with lower abdominal pain, bilaterally, she points to the region just superior to the inguinal ligaments bilaterally, she says this is crampy, intermittent, she says she typically doesn't like to take medicine and so hasn't taken anything for it, she's having increased vaginal bleeding with clots. She says she's changing pads about every 3 hours. He says the cramping is moderate to severe, 8/10 in intensity.  Patient has a history of irregular menstrual periods and typically is having periods about every 5 months however her last "normal" period was in February and then she got another period on 04/25/2012 and is still having that period now.  Patient is followed with OB/GYN at [facility] in the past. She is currently sexually active with her boyfriend, denies any vaginal discharge vaginal itching.  Denies nausea vomiting or diarrhea. Denies dysuria, endorses frequency. No polyuria or polydipsia.  Patient is had some increased stress recently, she has 2 jobs, and when she feels like she's not getting enough hours in the other she feels like she's on her feet too long which is about 10 hours a day.   Past Medical History  Diagnosis Date  . Hypertension     Past Surgical History  Procedure Laterality Date  . Tonsillectomy    . Adenoidectomy      Family History  Problem Relation Age of Onset  . Hypertension Mother   . Diabetes Mother     History  Substance Use Topics  . Smoking status: Current Some Day Smoker  . Smokeless tobacco: Never Used  . Alcohol Use: No    OB History   Grav Para Term Preterm Abortions TAB SAB  Ect Mult Living                  Review of Systems At least 10pt or greater review of systems completed and are negative except where specified in the HPI.  Allergies  Review of patient's allergies indicates no known allergies.  Home Medications  No current outpatient prescriptions on file.  BP 158/91  Pulse 90  Temp(Src) 98.6 F (37 C) (Oral)  Resp 20  SpO2 98%  LMP 03/04/2012  Physical Exam  Nursing notes reviewed.  Electronic medical record reviewed. VITAL SIGNS:   Filed Vitals:   05/06/12 0031 05/06/12 0349  BP: 158/91 132/94  Pulse: 90 90  Temp: 98.6 F (37 C) 98.9 F (37.2 C)  TempSrc: Oral Oral  Resp: 20 20  SpO2: 98% 100%   CONSTITUTIONAL: Awake, oriented, appears non-toxic HENT: Atraumatic, normocephalic, oral mucosa pink and moist, airway patent. Nares patent without drainage. External ears normal. EYES: Conjunctiva clear, EOMI, PERRLA NECK: Trachea midline, non-tender, supple CARDIOVASCULAR: Normal heart rate, Normal rhythm, No murmurs, rubs, gallops PULMONARY/CHEST: Clear to auscultation, no rhonchi, wheezes, or rales. Symmetrical breath sounds. Non-tender. ABDOMINAL: Non-distended, morbidly obese, soft, non-tender - no rebound or guarding.  BS normal. PELVIC EXAM: normal external genitalia, vulva, vagina, cervix, uterus and adnexa are not tender to palpation. There is some blood in the vault.  NEUROLOGIC: Non-focal, moving all four extremities, no gross sensory or motor deficits. EXTREMITIES: No clubbing, cyanosis, or edema SKIN: Warm, Dry, No  erythema, No rash  ED Course  Procedures (including critical care time)  Labs Reviewed  WET PREP, GENITAL - Abnormal; Notable for the following:    WBC, Wet Prep HPF POC RARE (*)    All other components within normal limits  URINALYSIS, ROUTINE W REFLEX MICROSCOPIC - Abnormal; Notable for the following:    Color, Urine AMBER (*)    APPearance CLOUDY (*)    Hgb urine dipstick LARGE (*)    Leukocytes, UA  SMALL (*)    All other components within normal limits  CBC - Abnormal; Notable for the following:    RBC 5.43 (*)    MCV 74.0 (*)    MCH 23.9 (*)    All other components within normal limits  GC/CHLAMYDIA PROBE AMP  PREGNANCY, URINE  URINE MICROSCOPIC-ADD ON   No results found.   1. Menometrorrhagia   2. Abdominal pain       MDM  Laura Benson is a 22 y.o. female presenting with irregular periods, and more frequent periods recently. She previously been on her control pills and is now off of those.  Patient has no urinary tract infection, she's not pregnant, she is nontoxic, afebrile, abdomen is benign. There is some blood in the vaginal vault but otherwise pelvic exam is unremarkable. Suspect patient has menometrorrhagia, we'll refer her back to [facility], patient likely would benefit from oral contraceptives versus an implant she was on before. Patient responded well to naproxen, she has not taken anything for her discomfort previously and sole prescriber when I gave her in the emergency department. She says her cramps are gone at this point.  I explained the diagnosis and have given explicit precautions to return to the ER including for any other new or worsening symptoms. The patient understands and accepts the medical plan as it's been dictated and I have answered their questions. Discharge instructions concerning home care and prescriptions have been given.  The patient is STABLE and is discharged to home in good condition.        Jones Skene, MD 05/06/12 [phone]

## 2012-05-07 LAB — GC/CHLAMYDIA PROBE AMP: CT Probe RNA: POSITIVE — AB

## 2012-05-08 ENCOUNTER — Telehealth (HOSPITAL_COMMUNITY): Payer: Self-pay | Admitting: Emergency Medicine

## 2012-05-08 NOTE — ED Notes (Signed)
+  Chlamydia. Chart sent to EDP office for review. [facility] attached. 

## 2012-05-08 NOTE — ED Notes (Signed)
Patient has +Chlamydia. 

## 2012-05-09 ENCOUNTER — Telehealth (HOSPITAL_COMMUNITY): Payer: Self-pay | Admitting: Emergency Medicine

## 2012-05-09 NOTE — ED Notes (Signed)
Chart returned from EDP office. Per Joni Reining Pisciotta PA-C, Rx Azithromycin 1 gram PO x 1. No refills.

## 2012-10-20 ENCOUNTER — Encounter (HOSPITAL_COMMUNITY): Payer: Self-pay | Admitting: Emergency Medicine

## 2012-10-20 ENCOUNTER — Emergency Department (HOSPITAL_COMMUNITY)
Admission: EM | Admit: 2012-10-20 | Discharge: 2012-10-20 | Disposition: A | Discharge status: Home / Self Care | Payer: BC Managed Care – PPO | Attending: Emergency Medicine | Admitting: Emergency Medicine

## 2012-10-20 DIAGNOSIS — I10 Essential (primary) hypertension: Secondary | ICD-10-CM | POA: Insufficient documentation

## 2012-10-20 DIAGNOSIS — K625 Hemorrhage of anus and rectum: Secondary | ICD-10-CM | POA: Diagnosis present

## 2012-10-20 DIAGNOSIS — F17200 Nicotine dependence, unspecified, uncomplicated: Secondary | ICD-10-CM | POA: Insufficient documentation

## 2012-10-20 DIAGNOSIS — Z791 Long term (current) use of non-steroidal anti-inflammatories (NSAID): Secondary | ICD-10-CM | POA: Insufficient documentation

## 2012-10-20 DIAGNOSIS — R42 Dizziness and giddiness: Secondary | ICD-10-CM | POA: Insufficient documentation

## 2012-10-20 DIAGNOSIS — Z3202 Encounter for pregnancy test, result negative: Secondary | ICD-10-CM | POA: Insufficient documentation

## 2012-10-20 DIAGNOSIS — Z8659 Personal history of other mental and behavioral disorders: Secondary | ICD-10-CM | POA: Insufficient documentation

## 2012-10-20 DIAGNOSIS — K921 Melena: Secondary | ICD-10-CM | POA: Insufficient documentation

## 2012-10-20 HISTORY — DX: Major depressive disorder, single episode, unspecified: F32.9

## 2012-10-20 HISTORY — DX: Depression, unspecified: F32.A

## 2012-10-20 LAB — POCT I-STAT, CHEM 8
BUN: 10 mg/dL (ref 6–23)
Calcium, Ion: 1.19 mmol/L (ref 1.12–1.23)
Chloride: 106 mEq/L (ref 96–112)
Creatinine, Ser: 0.8 mg/dL (ref 0.50–1.10)
Glucose, Bld: 128 mg/dL — ABNORMAL HIGH (ref 70–99)
HCT: 44 % (ref 36.0–46.0)
Hemoglobin: 15 g/dL (ref 12.0–15.0)
Sodium: 142 mEq/L (ref 135–145)

## 2012-10-20 MED ORDER — SODIUM CHLORIDE 0.9 % IV BOLUS (SEPSIS)
1000.0000 mL | INTRAVENOUS | Status: AC
  Administered 2012-10-20: 1000 mL via INTRAVENOUS

## 2012-10-20 NOTE — ED Notes (Signed)
Pt. On cardiac monitor per RN, Cammy Copa.

## 2012-10-20 NOTE — ED Notes (Signed)
Pt states that she has been having blood in her stool x 2 weeks and today when she was at work she was moving a lot in a hot room and became dizzy and SOB. No acute distress at this time. Alert and oriented.

## 2012-10-20 NOTE — ED Provider Notes (Signed)
CSN: [id number]     Arrival date & time 10/20/12  1902 History   First MD Initiated Contact with Patient 10/20/12 1910     Chief Complaint  Patient presents with  . Dizziness  . Rectal Bleeding   (Consider location/radiation/quality/duration/timing/severity/associated sxs/prior Treatment) Patient is a 22 y.o. female presenting with hematochezia. The history is provided by the patient.  Rectal Bleeding Quality:  Bright red Amount:  Scant Duration:  2 weeks Timing:  Intermittent Progression:  Unchanged Chronicity:  New Context: defecation   Context: not constipation   Similar prior episodes: no   Relieved by:  Nothing Worsened by:  Nothing tried Ineffective treatments:  None tried Associated symptoms: no abdominal pain, no dizziness, no fever, no loss of consciousness and no vomiting     Past Medical History  Diagnosis Date  . Hypertension   . Depression    Past Surgical History  Procedure Laterality Date  . Tonsillectomy    . Adenoidectomy     Family History  Problem Relation Age of Onset  . Hypertension Mother   . Diabetes Mother    History  Substance Use Topics  . Smoking status: Current Some Day Smoker  . Smokeless tobacco: Never Used  . Alcohol Use: No   OB History   Grav Para Term Preterm Abortions TAB SAB Ect Mult Living                 Review of Systems  Constitutional: Negative for fever and fatigue.  HENT: Negative for congestion, drooling and neck pain.   Eyes: Negative for pain.  Respiratory: Negative for cough and shortness of breath.   Cardiovascular: Negative for chest pain.  Gastrointestinal: Positive for hematochezia. Negative for nausea, vomiting, abdominal pain and diarrhea.  Genitourinary: Negative for dysuria and hematuria.  Musculoskeletal: Negative for back pain and gait problem.  Skin: Negative for color change.  Neurological: Negative for dizziness, loss of consciousness and headaches.  Hematological: Negative for adenopathy.   Psychiatric/Behavioral: Negative for behavioral problems.  All other systems reviewed and are negative.    Allergies  Review of patient's allergies indicates no known allergies.  Home Medications   Current Outpatient Rx  Name  Route  Sig  Dispense  Refill  . naproxen (NAPROSYN) 500 MG tablet   Oral   Take 1 tablet (500 mg total) by mouth 2 (two) times daily.   30 tablet   0    BP 156/92  Pulse 114  Temp(Src) 98.6 F (37 C) (Oral)  Resp 22  SpO2 100%  LMP 10/01/2012 Physical Exam  Nursing note and vitals reviewed. Constitutional: She is oriented to person, place, and time. She appears well-developed and well-nourished.  HENT:  Head: Normocephalic.  Mouth/Throat: No oropharyngeal exudate.  Eyes: Conjunctivae and EOM are normal. Pupils are equal, round, and reactive to light.  Neck: Normal range of motion. Neck supple.  Cardiovascular: Regular rhythm, normal heart sounds and intact distal pulses.  Exam reveals no gallop and no friction rub.   No murmur heard. HR 114  Pulmonary/Chest: Effort normal and breath sounds normal. No respiratory distress. She has no wheezes.  Abdominal: Soft. Bowel sounds are normal. There is no tenderness. There is no rebound and no guarding.  Genitourinary:  Normal appearing external rectum. No ttp, no stool burden, normal appearing brownish stool. No gross blood.   Musculoskeletal: Normal range of motion. She exhibits no edema and no tenderness.  Neurological: She is alert and oriented to person, place, and time.  Skin:  Skin is warm and dry.  Psychiatric: She has a normal mood and affect. Her behavior is normal.    ED Course  Procedures (including critical care time) Labs Review Labs Reviewed  GLUCOSE, CAPILLARY - Abnormal; Notable for the following:    Glucose-Capillary 124 (*)    All other components within normal limits  POCT I-STAT, CHEM 8 - Abnormal; Notable for the following:    Glucose, Bld 128 (*)    All other components  within normal limits  POCT PREGNANCY, URINE  OCCULT BLOOD, POC DEVICE   Imaging Review No results found.  MDM   1. Rectal bleeding    7:29 PM 22 y.o. female who presents with mild dizziness and presyncopal symptoms. She states that she has been working more at her job in [employer]. She feels like she's been so busy she has not had a lot of sleep as well. She notes her symptoms are worse when she's moving around but she is asymptomatic at rest on exam. She doesn't notes small moderate blood with stooling for the last 2 weeks. She is afebrile and mildly tachycardic per triage vitals. Will get screening labwork, rectal exam.  9:48 PM: Hemeoccult neg, H/H stable, HR has come down to 90's w/ po hydration. Will rec light duty and sleep hygiene for several days. I have discussed the diagnosis/risks/treatment options with the patient and believe the pt to be eligible for discharge home to follow-up with and establish w a pcp. We also discussed returning to the ED immediately if new or worsening sx occur. We discussed the sx which are most concerning (e.g., worsening dizziness, sob, weakness, worsening bloody stools) that necessitate immediate return. Any new prescriptions provided to the patient are listed below.  New Prescriptions   No medications on file     Junius Argyle, MD 10/21/12 1054

## 2012-11-11 ENCOUNTER — Emergency Department (HOSPITAL_COMMUNITY)
Admission: EM | Admit: 2012-11-11 | Discharge: 2012-11-11 | Disposition: A | Discharge status: Home / Self Care | Payer: BC Managed Care – PPO | Attending: Emergency Medicine | Admitting: Emergency Medicine

## 2012-11-11 ENCOUNTER — Encounter (HOSPITAL_COMMUNITY): Payer: Self-pay | Admitting: Emergency Medicine

## 2012-11-11 ENCOUNTER — Emergency Department (HOSPITAL_COMMUNITY): Payer: BC Managed Care – PPO

## 2012-11-11 DIAGNOSIS — R071 Chest pain on breathing: Secondary | ICD-10-CM | POA: Insufficient documentation

## 2012-11-11 DIAGNOSIS — R059 Cough, unspecified: Secondary | ICD-10-CM | POA: Insufficient documentation

## 2012-11-11 DIAGNOSIS — R05 Cough: Secondary | ICD-10-CM | POA: Insufficient documentation

## 2012-11-11 DIAGNOSIS — I10 Essential (primary) hypertension: Secondary | ICD-10-CM | POA: Insufficient documentation

## 2012-11-11 DIAGNOSIS — Z87891 Personal history of nicotine dependence: Secondary | ICD-10-CM | POA: Insufficient documentation

## 2012-11-11 DIAGNOSIS — J3489 Other specified disorders of nose and nasal sinuses: Secondary | ICD-10-CM | POA: Insufficient documentation

## 2012-11-11 DIAGNOSIS — F3289 Other specified depressive episodes: Secondary | ICD-10-CM | POA: Insufficient documentation

## 2012-11-11 DIAGNOSIS — J40 Bronchitis, not specified as acute or chronic: Secondary | ICD-10-CM

## 2012-11-11 DIAGNOSIS — J209 Acute bronchitis, unspecified: Secondary | ICD-10-CM | POA: Insufficient documentation

## 2012-11-11 DIAGNOSIS — F329 Major depressive disorder, single episode, unspecified: Secondary | ICD-10-CM | POA: Insufficient documentation

## 2012-11-11 LAB — POCT I-STAT, CHEM 8
BUN: 5 mg/dL — ABNORMAL LOW (ref 6–23)
Calcium, Ion: 1.2 mmol/L (ref 1.12–1.23)
Chloride: 102 mEq/L (ref 96–112)
Creatinine, Ser: 1 mg/dL (ref 0.50–1.10)
Sodium: 140 mEq/L (ref 135–145)
TCO2: 25 mmol/L (ref 0–100)

## 2012-11-11 MED ORDER — AZITHROMYCIN 250 MG PO TABS: ORAL_TABLET | ORAL | Status: DC

## 2012-11-11 MED ORDER — ALBUTEROL SULFATE HFA 108 (90 BASE) MCG/ACT IN AERS
2.0000 | INHALATION_SPRAY | RESPIRATORY_TRACT | Status: DC | PRN
  Administered 2012-11-11: 2 via RESPIRATORY_TRACT
  Filled 2012-11-11: qty 6.7

## 2012-11-11 NOTE — ED Notes (Signed)
For 2 days now has had a cough and sob, states that she has yellow phlem.

## 2012-11-11 NOTE — ED Provider Notes (Signed)
CSN: [id number]     Arrival date & time 11/11/12  9629 History   First MD Initiated Contact with Patient 11/11/12 1000     Chief Complaint  Patient presents with  . Shortness of Breath  . Cough   (Consider location/radiation/quality/duration/timing/severity/associated sxs/prior Treatment) HPI Comments: Patient presents with one-day history of shortness of breath, chest pain, productive cough. She states her shortness of breath started yesterday but it became worse this morning when she woke up in a.m. Her cough is productive of yellow mucus with streaks of red she thinks is blood. She denies any fever. She has not traveled recently she denies any history of blood clots. She denies any history of asthma or COPD he does not smoke. She denies any sick contacts. She denies any leg pain or swelling. His chest tightness with coughing and feels it is difficult with a deep breath.  The history is provided by the patient.    Past Medical History  Diagnosis Date  . Hypertension   . Depression    Past Surgical History  Procedure Laterality Date  . Tonsillectomy    . Adenoidectomy     Family History  Problem Relation Age of Onset  . Hypertension Mother   . Diabetes Mother    History  Substance Use Topics  . Smoking status: Current Some Day Smoker  . Smokeless tobacco: Never Used  . Alcohol Use: No   OB History   Grav Para Term Preterm Abortions TAB SAB Ect Mult Living                 Review of Systems  Constitutional: Negative for fever, activity change and appetite change.  HENT: Positive for rhinorrhea.   Respiratory: Positive for cough and shortness of breath.   Cardiovascular: Positive for chest pain.  Gastrointestinal: Negative for nausea, vomiting and abdominal pain.  Genitourinary: Negative for dysuria, hematuria, vaginal bleeding and vaginal discharge.  Musculoskeletal: Negative for arthralgias, back pain and myalgias.  Skin: Negative for rash.  Neurological: Negative  for dizziness, weakness and headaches.  A complete 10 system review of systems was obtained and all systems are negative except as noted in the HPI and PMH.    Allergies  Review of patient's allergies indicates no known allergies.  Home Medications   Current Outpatient Rx  Name  Route  Sig  Dispense  Refill  . DM-Doxylamine-Acetaminophen (VICKS NYQUIL COLD & FLU) 15-6.25-325 MG/15ML LIQD   Oral   Take 15 mLs by mouth at bedtime as needed (cold symptoms).         . Phenylephrine-Pheniramine-DM (THERAFLU COLD & COUGH) 11-16-18 MG PACK   Oral   Take 1 packet by mouth daily as needed (cold symptoms).         Marland Kitchen azithromycin (ZITHROMAX Z-PAK) 250 MG tablet      2 tablet PO today then 1 tablet PO daily   6 tablet   0    BP 128/85  Pulse 89  Temp(Src) 98.4 F (36.9 C) (Oral)  Resp 18  SpO2 96%  LMP 10/28/2012 Physical Exam  Constitutional: She is oriented to person, place, and time. She appears well-developed and well-nourished. No distress.  Super morbidly obese  HENT:  Head: Normocephalic and atraumatic.  Mouth/Throat: Oropharynx is clear and moist. No oropharyngeal exudate.  Eyes: Conjunctivae and EOM are normal. Pupils are equal, round, and reactive to light.  Neck: Normal range of motion. Neck supple.  Cardiovascular: Normal rate, regular rhythm and normal heart sounds.  No murmur heard. Pulmonary/Chest: Effort normal and breath sounds normal. No respiratory distress. She has no wheezes. She exhibits tenderness.  TTP anterior chest wall, no bruising.   Abdominal: Soft. There is no tenderness. There is no rebound and no guarding.  Musculoskeletal: Normal range of motion. She exhibits no edema and no tenderness.  Neurological: She is alert and oriented to person, place, and time. No cranial nerve deficit. She exhibits normal muscle tone. Coordination normal.  Skin: Skin is warm.    ED Course  Procedures (including critical care time) Labs Review Labs Reviewed   POCT I-STAT, CHEM 8 - Abnormal; Notable for the following:    BUN 5 (*)    Glucose, Bld 141 (*)    All other components within normal limits  D-DIMER, QUANTITATIVE   Imaging Review Dg Chest 2 View  11/11/2012   CLINICAL DATA:  Shortness of breath and cough  EXAM: CHEST  2 VIEW  COMPARISON:  September 26, 2011  FINDINGS: Lungs are clear. Heart size and pulmonary vascularity are normal. No adenopathy. No bone lesions.  IMPRESSION: No abnormality noted.   Electronically Signed   By: Bretta Bang M.D.   On: 11/11/2012 10:17    EKG Interpretation     Ventricular Rate:  86 PR Interval:  144 QRS Duration: 83 QT Interval:  358 QTC Calculation: 429 R Axis:   63 Text Interpretation:  Sinus rhythm Nonspecific T abnormalities, inferior leads No significant change was found            MDM   1. Bronchitis    Cough, SOB, chest pain. Patient may have seen some blood in her sputum. She most likely has bronchitis but cannot rule out pulmonary embolism given her chest pain, shortness of breath, morbid obesity and apparentt hemoptysis  Chest x-ray negative. D-dimer negative. EKG normal sinus rhythm with unchanged T wave inversions.  Patient without hypoxia or increased work of breathing in the ED. She is given an albuterol inhaler and will be treated for bronchitis. Followup with PCP. Return precautions discussed.   Glynn Octave, MD 11/11/12 1150

## 2012-11-11 NOTE — ED Notes (Signed)
Patient transported to X-ray 

## 2013-02-14 ENCOUNTER — Emergency Department (HOSPITAL_COMMUNITY): Payer: BC Managed Care – PPO

## 2013-02-14 ENCOUNTER — Emergency Department (HOSPITAL_COMMUNITY)
Admission: EM | Admit: 2013-02-14 | Discharge: 2013-02-14 | Disposition: A | Discharge status: Home / Self Care | Payer: BC Managed Care – PPO | Attending: Emergency Medicine | Admitting: Emergency Medicine

## 2013-02-14 DIAGNOSIS — Z8659 Personal history of other mental and behavioral disorders: Secondary | ICD-10-CM | POA: Insufficient documentation

## 2013-02-14 DIAGNOSIS — I10 Essential (primary) hypertension: Secondary | ICD-10-CM | POA: Insufficient documentation

## 2013-02-14 DIAGNOSIS — J111 Influenza due to unidentified influenza virus with other respiratory manifestations: Secondary | ICD-10-CM

## 2013-02-14 DIAGNOSIS — F17200 Nicotine dependence, unspecified, uncomplicated: Secondary | ICD-10-CM | POA: Insufficient documentation

## 2013-02-14 DIAGNOSIS — Z3202 Encounter for pregnancy test, result negative: Secondary | ICD-10-CM | POA: Insufficient documentation

## 2013-02-14 LAB — POCT PREGNANCY, URINE: Preg Test, Ur: NEGATIVE

## 2013-02-14 LAB — GLUCOSE, CAPILLARY: GLUCOSE-CAPILLARY: 143 mg/dL — AB (ref 70–99)

## 2013-02-14 MED ORDER — SODIUM CHLORIDE 0.9 % IV BOLUS (SEPSIS): 1000.0000 mL | Freq: Once | INTRAVENOUS | Status: DC

## 2013-02-14 MED ORDER — OSELTAMIVIR PHOSPHATE 30 MG PO CAPS: 30.0000 mg | ORAL_CAPSULE | Freq: Once | ORAL | Status: DC

## 2013-02-14 MED ORDER — ACETAMINOPHEN 325 MG PO TABS
650.0000 mg | ORAL_TABLET | Freq: Once | ORAL | Status: AC
  Administered 2013-02-14: 650 mg via ORAL
  Filled 2013-02-14: qty 2

## 2013-02-14 MED ORDER — OSELTAMIVIR PHOSPHATE 75 MG PO CAPS
75.0000 mg | ORAL_CAPSULE | Freq: Once | ORAL | Status: AC
  Administered 2013-02-14: 75 mg via ORAL
  Filled 2013-02-14: qty 1

## 2013-02-14 MED ORDER — IBUPROFEN 800 MG PO TABS: 800.0000 mg | ORAL_TABLET | Freq: Three times a day (TID) | ORAL | Status: DC

## 2013-02-14 MED ORDER — KETOROLAC TROMETHAMINE 60 MG/2ML IM SOLN
60.0000 mg | Freq: Once | INTRAMUSCULAR | Status: AC
  Administered 2013-02-14: 60 mg via INTRAMUSCULAR
  Filled 2013-02-14: qty 2

## 2013-02-14 MED ORDER — OSELTAMIVIR PHOSPHATE 75 MG PO CAPS: 75.0000 mg | ORAL_CAPSULE | Freq: Two times a day (BID) | ORAL | Status: DC

## 2013-02-14 MED ORDER — ACETAMINOPHEN 325 MG PO TABS
325.0000 mg | ORAL_TABLET | Freq: Once | ORAL | Status: AC
  Administered 2013-02-14: 325 mg via ORAL
  Filled 2013-02-14: qty 1

## 2013-02-14 NOTE — ED Notes (Signed)
Patient is alert and oriented x3.  She was given DC instructions and follow up visit instructions.  Patient gave verbal understanding. She was DC ambulatory under her own power to home.  V/S stable.  He was not showing any signs of distress on DC 

## 2013-02-14 NOTE — ED Provider Notes (Signed)
CSN: [id number]     Arrival date & time 02/14/13  1752 History   First MD Initiated Contact with Patient 02/14/13 1920     No chief complaint on file.  (Consider location/radiation/quality/duration/timing/severity/associated sxs/prior Treatment) Patient is a 23 y.o. female presenting with flu symptoms. The history is provided by the patient.  Influenza Presenting symptoms: cough, fever and myalgias   Cough:    Cough characteristics:  Non-productive   Sputum characteristics:  Nondescript   Severity:  Moderate   Onset quality:  Sudden   Duration:  2 days   Timing:  Constant   Progression:  Unchanged   Chronicity:  New   Past Medical History  Diagnosis Date  . Hypertension   . Depression    Past Surgical History  Procedure Laterality Date  . Tonsillectomy    . Adenoidectomy     Family History  Problem Relation Age of Onset  . Hypertension Mother   . Diabetes Mother    History  Substance Use Topics  . Smoking status: Current Some Day Smoker  . Smokeless tobacco: Never Used  . Alcohol Use: No   OB History   Grav Para Term Preterm Abortions TAB SAB Ect Mult Living                 Review of Systems  Constitutional: Positive for fever.  Respiratory: Positive for cough.   Musculoskeletal: Positive for myalgias.  All other systems reviewed and are negative.    Allergies  Review of patient's allergies indicates no known allergies.  Home Medications  No current outpatient prescriptions on file. BP 159/93  Pulse 124  Temp(Src) 103 F (39.4 C) (Oral)  Resp 24  SpO2 97% Physical Exam  Nursing note and vitals reviewed. Constitutional: She is oriented to person, place, and time. She appears well-developed and well-nourished. No distress.  HENT:  Head: Normocephalic and atraumatic.  Eyes: EOM are normal. Pupils are equal, round, and reactive to light.  Neck: Normal range of motion. Neck supple.  Cardiovascular: Normal rate and regular rhythm.  Exam reveals no  friction rub.   No murmur heard. Pulmonary/Chest: Effort normal and breath sounds normal. No respiratory distress. She has no wheezes. She has no rales.  Abdominal: Soft. She exhibits no distension. There is no tenderness. There is no rebound.  Musculoskeletal: Normal range of motion. She exhibits no edema.  Neurological: She is alert and oriented to person, place, and time.  Skin: No rash noted. She is not diaphoretic.    ED Course  Procedures (including critical care time) Labs Review Labs Reviewed  GLUCOSE, CAPILLARY - Abnormal; Notable for the following:    Glucose-Capillary 143 (*)    All other components within normal limits   Imaging Review Dg Chest 2 View  02/14/2013   CLINICAL DATA:  Shortness of breath and cough.  EXAM: CHEST  2 VIEW  COMPARISON:  PA and lateral chest 11/11/2012.  FINDINGS: Heart size and mediastinal contours are within normal limits. Both lungs are clear. Visualized skeletal structures are unremarkable.  IMPRESSION: Negative exam.   Electronically Signed   By: Drusilla Kanner M.D.   On: 02/14/2013 20:53    EKG Interpretation   None       MDM   1. Influenza    SUBJECTIVE:  Laura Benson is a 23 y.o. female who present complaining of flu-like symptoms: fevers, chills, myalgias, congestion, sore throat and cough for 2 days. Denies dyspnea or wheezing.  OBJECTIVE: Appears moderately ill but not toxic;  temperature as noted in vitals. Ears normal. Throat and pharynx normal.  Neck supple. No adenopathy in the neck. Sinuses non tender. The chest is clear.  ASSESSMENT: Influenza  PLAN: Tamiflu, CXR to look for possible PNA. CXR clear. Symptomatic therapy suggested: rest, increase fluids and call prn if symptoms persist or worsen. Call or return to clinic prn if these symptoms worsen or fail to improve as anticipated.     Dagmar HaitWilliam Eland Lamantia, MD 02/14/13 [phone]

## 2013-02-14 NOTE — Discharge Instructions (Signed)

## 2013-02-17 ENCOUNTER — Emergency Department (HOSPITAL_COMMUNITY)
Admission: EM | Admit: 2013-02-17 | Discharge: 2013-02-17 | Disposition: A | Discharge status: Home / Self Care | Payer: BC Managed Care – PPO | Attending: Emergency Medicine | Admitting: Emergency Medicine

## 2013-02-17 ENCOUNTER — Encounter (HOSPITAL_COMMUNITY): Payer: Self-pay | Admitting: Emergency Medicine

## 2013-02-17 DIAGNOSIS — J069 Acute upper respiratory infection, unspecified: Secondary | ICD-10-CM

## 2013-02-17 DIAGNOSIS — Z791 Long term (current) use of non-steroidal anti-inflammatories (NSAID): Secondary | ICD-10-CM | POA: Insufficient documentation

## 2013-02-17 DIAGNOSIS — R197 Diarrhea, unspecified: Secondary | ICD-10-CM | POA: Insufficient documentation

## 2013-02-17 DIAGNOSIS — I10 Essential (primary) hypertension: Secondary | ICD-10-CM | POA: Insufficient documentation

## 2013-02-17 DIAGNOSIS — R52 Pain, unspecified: Secondary | ICD-10-CM | POA: Insufficient documentation

## 2013-02-17 DIAGNOSIS — Z8659 Personal history of other mental and behavioral disorders: Secondary | ICD-10-CM | POA: Insufficient documentation

## 2013-02-17 DIAGNOSIS — Z3202 Encounter for pregnancy test, result negative: Secondary | ICD-10-CM | POA: Insufficient documentation

## 2013-02-17 DIAGNOSIS — R112 Nausea with vomiting, unspecified: Secondary | ICD-10-CM

## 2013-02-17 DIAGNOSIS — R0789 Other chest pain: Secondary | ICD-10-CM | POA: Insufficient documentation

## 2013-02-17 DIAGNOSIS — Z79899 Other long term (current) drug therapy: Secondary | ICD-10-CM | POA: Insufficient documentation

## 2013-02-17 DIAGNOSIS — F17200 Nicotine dependence, unspecified, uncomplicated: Secondary | ICD-10-CM | POA: Insufficient documentation

## 2013-02-17 LAB — URINE MICROSCOPIC-ADD ON

## 2013-02-17 LAB — URINALYSIS, ROUTINE W REFLEX MICROSCOPIC
Bilirubin Urine: NEGATIVE
GLUCOSE, UA: NEGATIVE mg/dL
Ketones, ur: NEGATIVE mg/dL
LEUKOCYTES UA: NEGATIVE
NITRITE: NEGATIVE
PROTEIN: 30 mg/dL — AB
Specific Gravity, Urine: 1.008 (ref 1.005–1.030)
UROBILINOGEN UA: 0.2 mg/dL (ref 0.0–1.0)
pH: 6 (ref 5.0–8.0)

## 2013-02-17 LAB — POCT I-STAT, CHEM 8
BUN: 10 mg/dL (ref 6–23)
CALCIUM ION: 1.17 mmol/L (ref 1.12–1.23)
Chloride: 105 mEq/L (ref 96–112)
Creatinine, Ser: 1.1 mg/dL (ref 0.50–1.10)
GLUCOSE: 112 mg/dL — AB (ref 70–99)
HCT: 46 % (ref 36.0–46.0)
HEMOGLOBIN: 15.6 g/dL — AB (ref 12.0–15.0)
POTASSIUM: 4.1 meq/L (ref 3.7–5.3)
Sodium: 143 mEq/L (ref 137–147)
TCO2: 27 mmol/L (ref 0–100)

## 2013-02-17 LAB — GLUCOSE, CAPILLARY: Glucose-Capillary: 110 mg/dL — ABNORMAL HIGH (ref 70–99)

## 2013-02-17 LAB — POCT PREGNANCY, URINE: Preg Test, Ur: NEGATIVE

## 2013-02-17 MED ORDER — ENALAPRIL MALEATE 5 MG PO TABS: 10.0000 mg | ORAL_TABLET | Freq: Every day | ORAL | Status: DC

## 2013-02-17 MED ORDER — PROMETHAZINE HCL 25 MG PO TABS: 25.0000 mg | ORAL_TABLET | Freq: Four times a day (QID) | ORAL | Status: DC | PRN

## 2013-02-17 MED ORDER — HYDROCODONE-ACETAMINOPHEN 5-325 MG PO TABS: 1.0000 | ORAL_TABLET | ORAL | Status: DC | PRN

## 2013-02-17 NOTE — ED Notes (Signed)
Initial contact - pt called this RN to room, sts "i coughed up blood".  Noted to have scant amt blood in emesis bag.  EDPA made aware.  Nad.

## 2013-02-17 NOTE — ED Provider Notes (Signed)
CSN: [id number]     Arrival date & time 02/17/13  1857 History  This chart was scribed for non-physician practitioner Kyung Bacca, PA-C working with Richardean Canal, MD by Joaquin Music, ED Scribe. This patient was seen in room WTR5/WTR5 and the patient's care was started at 8:47 PM .   Chief Complaint  Patient presents with  . Generalized Body Aches  . Sore Throat   The history is provided by the patient. No language interpreter was used.   HPI Comments: Laura Benson is a 23 y.o. female with hx of borderline DM and HTN who presents to the Emergency Department complaining of ongoing chills, generalized body aches, productive cough with yellow sputum and streaks of blood and sore throat with associated nausea, rhinorrhea, and trouble swallowing that began 6 days ago . Pt was recently seen in the ED for cough and chills and states she was discharged with Tamiflu. She states she was unable to fill her prescription due to cost but states she has been taking Ibuprofen and drinking plenty of fluids. Pt states she has been having chest tightness, wheezing, rib pain, and SOB that worsens when walking that began 3 days ago. She states she has also been having frequency and states she needs to urinate about every 15 mins. Pt states her last dose of OTC Ibuprofen was last night. Pt states her cough can be productive at night and may keep her up at night. 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 Pt denies extremity pain, swelling, fever, and abd pain.  Pt states she is "suppose" to be taking BP medication but states she is unable to afford her medications. She states the last time she took her BP medication was about 5 years ago.  Past Medical History  Diagnosis Date  . Hypertension   . Depression    Past Surgical History  Procedure Laterality Date  . Tonsillectomy    . Adenoidectomy     Family History  Problem Relation Age of  Onset  . Hypertension Mother   . Diabetes Mother    History  Substance Use Topics  . Smoking status: Current Some Day Smoker  . Smokeless tobacco: Never Used  . Alcohol Use: No   OB History   Grav Para Term Preterm Abortions TAB SAB Ect Mult Living                 Review of Systems  All other systems reviewed and are negative.   Allergies  Review of patient's allergies indicates no known allergies.  Home Medications   Current Outpatient Rx  Name  Route  Sig  Dispense  Refill  . ibuprofen (ADVIL,MOTRIN) 800 MG tablet   Oral   Take 1 tablet (800 mg total) by mouth 3 (three) times daily.   21 tablet   0   . oseltamivir (TAMIFLU) 75 MG capsule   Oral   Take 1 capsule (75 mg total) by mouth 2 (two) times daily.   10 capsule   0    BP 160/101  Pulse 94  Temp(Src) 98.9 F (37.2 C) (Oral)  Resp 20  SpO2 96%  LMP 02/08/2013  Physical Exam  Nursing note and vitals reviewed. Constitutional: She is oriented to person, place, and time. She appears well-developed and well-nourished. No distress.  Morbidly obese  HENT:  Head: Normocephalic and atraumatic.  Tonsils surgically absent.  Mild erythema posterior pharynx and soft palate.  No trismus.  Uvula mid-line  Eyes:  Normal appearance  Neck: Normal range of motion. Neck supple.  Cardiovascular: Normal rate and regular rhythm.   Pulmonary/Chest: Effort normal and breath sounds normal. No respiratory distress. She has no wheezes.  Abdominal: Soft. Bowel sounds are normal. She exhibits no distension and no mass. There is no rebound and no guarding.  Mildly, diffusely ttp  Genitourinary:  No CVA tenderness  Musculoskeletal: Normal range of motion.  No edema or tenderness of calves  Lymphadenopathy:    She has no cervical adenopathy.  Neurological: She is alert and oriented to person, place, and time.  Skin: Skin is warm and dry. No rash noted.  Psychiatric: She has a normal mood and affect. Her behavior is normal.     ED Course  Procedures DIAGNOSTIC STUDIES: Oxygen Saturation is 96% on RA, normal by my interpretation.    COORDINATION OF CARE: 8:53 PM-Discussed treatment plan which includes advised pt to take OTC Ibuprofen, stay hydrated, and get plenty of rest. Pt agreed to plan.   10:40 PM- Informed pt of lab findings. Will discharge pt with BP medication and cough medicine. Advised pt to F/U in 2 weeks. Reccommended pt to get a PCP. Pt agreed to plan.  Labs Review Labs Reviewed - No data to display Imaging Review No results found.  EKG Interpretation   None      MDM   1. Viral URI   2. Nausea vomiting and diarrhea    22yo morbidly obese F w/ poorly controlled HTN, presents with sore throat, cough w/ mid-line CP, exertional SOB, body aches, nausea, diarrhea and increased urinary freq.  Presented w/ same 4 days ago, had a neg CXR, was diagnosed w/ influenza and d/c'd home w/ tamiflu.  She was unable to afford, but has been taking ibuprofen and drinking plenty of fluids.  On exam today, afebrile, VS otherwise w/in nml range, non-toxic appearing, unremarkable ENT, lungs clear, abd soft/non-distended but diffusely, mildly ttp.  Cbg and urinalysis pending.  I have low suspicion for pna; VS w/in nml range, lungs clear, sx most consistent w/ viral infection.  Will prescribe hydrocodone for pain and cough suppression and  recommended coricidin, fluids and rest.  Will also prescribe promethazine and 2 week supply of enalapril. which is what the patient used to take and its on the $4 list.  Pt referred to primary care and instructed to f/u w/ [facility] for BP and Cr recheck.   Pt does not have diabetes based on current cbg.  U/A negative for infection.  Cr w/in nml range.  All results discussed w/ patient.  BP improved and pt otherwise stable at time of discharge.    I personally performed the services described in this documentation, which was scribed in my presence. The recorded information has been  reviewed and is accurate.    Otilio Miuatherine E Alexius Hangartner, PA-C 02/18/13 [phone]

## 2013-02-17 NOTE — Discharge Instructions (Signed)
Take vicodin as prescribed for severe pain and cough suppression.  Do not drive within four hours of taking this medication (may cause drowsiness or confusion).    Take promethazine as prescribed for nausea.   If you have frequent diarrhea, you can take over the counter imodium.  Try coricidin for nasal congestion and post-nasal drip.  Take ibuprofen for fever and body aches, but only 200mg  three times a day.  You can also take tylenol, but be aware that there is tylenol in your vicodin, and the max amount you can take per day is 3000mg .  Take your blood pressure medication as prescribed.  Follow up with a primary care doctor asap for blood pressure and kidney function recheck.  [facility] [phone]; [address]) if you can not see a doctor within 2 weeks.  Return to the ER if you have increased chest pain or shortness of breath or worsening abdominal pain or uncontrolled vomiting.

## 2013-02-17 NOTE — ED Notes (Signed)
Pt reports that she was recently seen on 02/14/13 and diagnosed with influenza. Pt states that she has now developed a sore throat, which began on Tuesday. Pt has an oxygen saturation of 96%. Pt is A/O x4 and in NAD.

## 2013-02-18 NOTE — ED Provider Notes (Signed)
Medical screening examination/treatment/procedure(s) were performed by non-physician practitioner and as supervising physician I was immediately available for consultation/collaboration.  EKG Interpretation   None         David H Yao, MD 02/18/13 1458 

## 2013-02-19 LAB — URINE CULTURE
COLONY COUNT: NO GROWTH
CULTURE: NO GROWTH

## 2013-02-21 ENCOUNTER — Emergency Department (HOSPITAL_COMMUNITY)
Admission: EM | Admit: 2013-02-21 | Discharge: 2013-02-21 | Disposition: A | Discharge status: Home / Self Care | Payer: BC Managed Care – PPO | Attending: Emergency Medicine | Admitting: Emergency Medicine

## 2013-02-21 ENCOUNTER — Encounter (HOSPITAL_COMMUNITY): Payer: Self-pay | Admitting: Emergency Medicine

## 2013-02-21 DIAGNOSIS — F17200 Nicotine dependence, unspecified, uncomplicated: Secondary | ICD-10-CM | POA: Insufficient documentation

## 2013-02-21 DIAGNOSIS — R059 Cough, unspecified: Secondary | ICD-10-CM | POA: Insufficient documentation

## 2013-02-21 DIAGNOSIS — R05 Cough: Secondary | ICD-10-CM | POA: Insufficient documentation

## 2013-02-21 DIAGNOSIS — F329 Major depressive disorder, single episode, unspecified: Secondary | ICD-10-CM | POA: Insufficient documentation

## 2013-02-21 DIAGNOSIS — Z91199 Patient's noncompliance with other medical treatment and regimen due to unspecified reason: Secondary | ICD-10-CM | POA: Insufficient documentation

## 2013-02-21 DIAGNOSIS — Z3202 Encounter for pregnancy test, result negative: Secondary | ICD-10-CM | POA: Insufficient documentation

## 2013-02-21 DIAGNOSIS — I10 Essential (primary) hypertension: Secondary | ICD-10-CM | POA: Insufficient documentation

## 2013-02-21 DIAGNOSIS — Z9119 Patient's noncompliance with other medical treatment and regimen: Secondary | ICD-10-CM | POA: Insufficient documentation

## 2013-02-21 DIAGNOSIS — F3289 Other specified depressive episodes: Secondary | ICD-10-CM | POA: Insufficient documentation

## 2013-02-21 LAB — URINALYSIS, ROUTINE W REFLEX MICROSCOPIC
BILIRUBIN URINE: NEGATIVE
Glucose, UA: NEGATIVE mg/dL
KETONES UR: NEGATIVE mg/dL
Leukocytes, UA: NEGATIVE
NITRITE: NEGATIVE
Protein, ur: NEGATIVE mg/dL
Specific Gravity, Urine: 1.009 (ref 1.005–1.030)
Urobilinogen, UA: 0.2 mg/dL (ref 0.0–1.0)
pH: 6.5 (ref 5.0–8.0)

## 2013-02-21 LAB — URINE MICROSCOPIC-ADD ON

## 2013-02-21 LAB — PREGNANCY, URINE: Preg Test, Ur: NEGATIVE

## 2013-02-21 MED ORDER — ENALAPRIL MALEATE 5 MG PO TABS: 10.0000 mg | ORAL_TABLET | Freq: Every day | ORAL | Status: DC

## 2013-02-21 NOTE — Discharge Instructions (Signed)
Arterial Hypertension Call today to schedule appointment with the wellness Center for within the next 2 weeks to get your blood pressure recheck. If you're still coughing significantly in 3 days get rechecked at the wellness Center. Return if you feel worse for any reason Arterial hypertension (high blood pressure) is a condition of elevated pressure in your blood vessels. Hypertension over a long period of time is a risk factor for strokes, heart attacks, and heart failure. It is also the leading cause of kidney (renal) failure.  CAUSES   In Adults -- Over 90% of all hypertension has no known cause. This is called essential or primary hypertension. In the other 10% of people with hypertension, the increase in blood pressure is caused by another disorder. This is called secondary hypertension. Important causes of secondary hypertension are:  Heavy alcohol use.  Obstructive sleep apnea.  Hyperaldosterosim (Conn's syndrome).  Steroid use.  Chronic kidney failure.  Hyperparathyroidism.  Medications.  Renal artery stenosis.  Pheochromocytoma.  Cushing's disease.  Coarctation of the aorta.  Scleroderma renal crisis.  Licorice (in excessive amounts).  Drugs (cocaine, methamphetamine). Your caregiver can explain any items above that apply to you.  In Children -- Secondary hypertension is more common and should always be considered.  Pregnancy -- Few women of childbearing age have high blood pressure. However, up to 10% of them develop hypertension of pregnancy. Generally, this will not harm the woman. It may be a sign of 3 complications of pregnancy: preeclampsia, HELLP syndrome, and eclampsia. Follow up and control with medication is necessary. SYMPTOMS   This condition normally does not produce any noticeable symptoms. It is usually found during a routine exam.  Malignant hypertension is a late problem of high blood pressure. It may have the following  symptoms:  Headaches.  Blurred vision.  End-organ damage (this means your kidneys, heart, lungs, and other organs are being damaged).  Stressful situations can increase the blood pressure. If a person with normal blood pressure has their blood pressure go up while being seen by their caregiver, this is often termed "white coat hypertension." Its importance is not known. It may be related with eventually developing hypertension or complications of hypertension.  Hypertension is often confused with mental tension, stress, and anxiety. DIAGNOSIS  The diagnosis is made by 3 separate blood pressure measurements. They are taken at least 1 week apart from each other. If there is organ damage from hypertension, the diagnosis may be made without repeat measurements. Hypertension is usually identified by having blood pressure readings:  Above 140/90 mmHg measured in both arms, at 3 separate times, over a couple weeks.  Over 130/80 mmHg should be considered a risk factor and may require treatment in patients with diabetes. Blood pressure readings over 120/80 mmHg are called "pre-hypertension" even in non-diabetic patients. To get a true blood pressure measurement, use the following guidelines. Be aware of the factors that can alter blood pressure readings.  Take measurements at least 1 hour after caffeine.  Take measurements 30 minutes after smoking and without any stress. This is another reason to quit smoking  it raises your blood pressure.  Use a proper cuff size. Ask your caregiver if you are not sure about your cuff size.  Most home blood pressure cuffs are automatic. They will measure systolic and diastolic pressures. The systolic pressure is the pressure reading at the start of sounds. Diastolic pressure is the pressure at which the sounds disappear. If you are elderly, measure pressures in multiple postures.  Try sitting, lying or standing.  Sit at rest for a minimum of 5 minutes before  taking measurements.  You should not be on any medications like decongestants. These are found in many cold medications.  Record your blood pressure readings and review them with your caregiver. If you have hypertension:  Your caregiver may do tests to be sure you do not have secondary hypertension (see "causes" above).  Your caregiver may also look for signs of metabolic syndrome. This is also called Syndrome X or Insulin Resistance Syndrome. You may have this syndrome if you have type 2 diabetes, abdominal obesity, and abnormal blood lipids in addition to hypertension.  Your caregiver will take your medical and family history and perform a physical exam.  Diagnostic tests may include blood tests (for glucose, cholesterol, potassium, and kidney function), a urinalysis, or an EKG. Other tests may also be necessary depending on your condition. PREVENTION  There are important lifestyle issues that you can adopt to reduce your chance of developing hypertension:  Maintain a normal weight.  Limit the amount of salt (sodium) in your diet.  Exercise often.  Limit alcohol intake.  Get enough potassium in your diet. Discuss specific advice with your caregiver.  Follow a DASH diet (dietary approaches to stop hypertension). This diet is rich in fruits, vegetables, and low-fat dairy products, and avoids certain fats. PROGNOSIS  Essential hypertension cannot be cured. Lifestyle changes and medical treatment can lower blood pressure and reduce complications. The prognosis of secondary hypertension depends on the underlying cause. Many people whose hypertension is controlled with medicine or lifestyle changes can live a normal, healthy life.  RISKS AND COMPLICATIONS  While high blood pressure alone is not an illness, it often requires treatment due to its short- and long-term effects on many organs. Hypertension increases your risk for:  CVAs or strokes (cerebrovascular accident).  Heart failure  due to chronically high blood pressure (hypertensive cardiomyopathy).  Heart attack (myocardial infarction).  Damage to the retina (hypertensive retinopathy).  Kidney failure (hypertensive nephropathy). Your caregiver can explain list items above that apply to you. Treatment of hypertension can significantly reduce the risk of complications. TREATMENT   For overweight patients, weight loss and regular exercise are recommended. Physical fitness lowers blood pressure.  Mild hypertension is usually treated with diet and exercise. A diet rich in fruits and vegetables, fat-free dairy products, and foods low in fat and salt (sodium) can help lower blood pressure. Decreasing salt intake decreases blood pressure in a 1/3 of people.  Stop smoking if you are a smoker. The steps above are highly effective in reducing blood pressure. While these actions are easy to suggest, they are difficult to achieve. Most patients with moderate or severe hypertension end up requiring medications to bring their blood pressure down to a normal level. There are several classes of medications for treatment. Blood pressure pills (antihypertensives) will lower blood pressure by their different actions. Lowering the blood pressure by 10 mmHg may decrease the risk of complications by as much as 25%. The goal of treatment is effective blood pressure control. This will reduce your risk for complications. Your caregiver will help you determine the best treatment for you according to your lifestyle. What is excellent treatment for one person, may not be for you. HOME CARE INSTRUCTIONS   Do not smoke.  Follow the lifestyle changes outlined in the "Prevention" section.  If you are on medications, follow the directions carefully. Blood pressure medications must be taken as prescribed. Skipping  doses reduces their benefit. It also puts you at risk for problems.  Follow up with your caregiver, as directed.  If you are asked to  monitor your blood pressure at home, follow the guidelines in the "Diagnosis" section above. SEEK MEDICAL CARE IF:   You think you are having medication side effects.  You have recurrent headaches or lightheadedness.  You have swelling in your ankles.  You have trouble with your vision. SEEK IMMEDIATE MEDICAL CARE IF:   You have sudden onset of chest pain or pressure, difficulty breathing, or other symptoms of a heart attack.  You have a severe headache.  You have symptoms of a stroke (such as sudden weakness, difficulty speaking, difficulty walking). MAKE SURE YOU:   Understand these instructions.  Will watch your condition.  Will get help right away if you are not doing well or get worse. Document Released: 01/13/2005 Document Revised: 04/07/2011 Document Reviewed: 08/13/2006 [facility] Patient Information 2014 [place] [place].

## 2013-02-21 NOTE — ED Notes (Signed)
Pt diagnosed with flu last wk; had pain/nausea on Thursday--seen again; today c/o blood in urine; denies dysuria; pt states clots in toilet; states has irregular periods-- last one 02/08/13

## 2013-02-21 NOTE — ED Provider Notes (Addendum)
CSN: [id number]     Arrival date & time 02/21/13  0818 History   First MD Initiated Contact with Patient 02/21/13 [phone]     Chief Complaint  Patient presents with  . Hematuria   (Consider location/radiation/quality/duration/timing/severity/associated sxs/prior Treatment) HPI Complaint of blood in urine onset 3 days ago. No other associated symptoms. No burning with urination no fever no vomiting. Patient seen here  Past Medical History  Diagnosis Date  . Hypertension   . Depression    Past Surgical History  Procedure Laterality Date  . Tonsillectomy    . Adenoidectomy     Family History  Problem Relation Age of Onset  . Hypertension Mother   . Diabetes Mother    ex-smoker quit 6 months ago no alcohol no drugs History  Substance Use Topics  . Smoking status: Current Some Day Smoker  . Smokeless tobacco: Never Used  . Alcohol Use: No   history of present illness continued: Patient was seen here 02/17/2013 for cough nausea vomiting diarrhea. She reports that she continues to cough. No other associated symptoms. OB History   Grav Para Term Preterm Abortions TAB SAB Ect Mult Living                 Review of Systems  Respiratory: Positive for cough.   Genitourinary: Positive for hematuria.       Irregular menses  All other systems reviewed and are negative.    Allergies  Review of patient's allergies indicates no known allergies.  Home Medications   Current Outpatient Rx  Name  Route  Sig  Dispense  Refill  . diphenhydrAMINE (BENADRYL) 25 mg capsule   Oral   Take 25 mg by mouth every 6 (six) hours as needed for allergies.         Marland Kitchen guaiFENesin (ROBITUSSIN) 100 MG/5ML liquid   Oral   Take 200 mg by mouth 3 (three) times daily as needed for cough.         Marland Kitchen ibuprofen (ADVIL,MOTRIN) 800 MG tablet   Oral   Take 1 tablet (800 mg total) by mouth 3 (three) times daily.   21 tablet   0   . Pseudoeph-Doxylamine-DM-APAP (NYQUIL PO)   Oral   Take 30 mLs by mouth  at bedtime.         . enalapril (VASOTEC) 5 MG tablet   Oral   Take 2 tablets (10 mg total) by mouth daily.   14 tablet   0   . HYDROcodone-acetaminophen (NORCO/VICODIN) 5-325 MG per tablet   Oral   Take 1 tablet by mouth every 4 (four) hours as needed for moderate pain.   20 tablet   0   . promethazine (PHENERGAN) 25 MG tablet   Oral   Take 1 tablet (25 mg total) by mouth every 6 (six) hours as needed for nausea or vomiting.   20 tablet   0    BP 159/106  Pulse 77  Temp(Src) 97.8 F (36.6 C) (Oral)  Resp 20  SpO2 95%  LMP 02/08/2013 Physical Exam  Nursing note and vitals reviewed. Constitutional: She appears well-developed and well-nourished.  HENT:  Head: Normocephalic and atraumatic.  Eyes: Conjunctivae are normal. Pupils are equal, round, and reactive to light.  Neck: Neck supple. No tracheal deviation present. No thyromegaly present.  Cardiovascular: Normal rate and regular rhythm.   No murmur heard. Pulmonary/Chest: Effort normal and breath sounds normal.  Abdominal: Soft. Bowel sounds are normal. She exhibits no distension. There is no  tenderness.  Morbidly obese  Musculoskeletal: Normal range of motion. She exhibits no edema and no tenderness.  Neurological: She is alert. Coordination normal.  Skin: Skin is warm and dry. No rash noted.  Psychiatric: She has a normal mood and affect.    ED Course  Procedures (including critical care time) Labs Review Labs Reviewed  PREGNANCY, URINE  URINALYSIS, ROUTINE W REFLEX MICROSCOPIC   Imaging Review No results found.  EKG Interpretation    Date/Time:  Monday February 21 2013 09:03:46 EST Ventricular Rate:  76 PR Interval:  140 QRS Duration: 88 QT Interval:  390 QTC Calculation: 438 R Axis:   66 Text Interpretation:  Sinus rhythm Nonspecific T abnormalities, inferior leads No significant change since last tracing Confirmed by Ethelda ChickJACUBOWITZ  MD, Gabriele Zwilling ([id number]) on 02/21/2013 11:32:14 AM           Results  for orders placed during the hospital encounter of 02/21/13  URINALYSIS, ROUTINE W REFLEX MICROSCOPIC      Result Value Range   Color, Urine YELLOW  YELLOW   APPearance CLOUDY (*) CLEAR   Specific Gravity, Urine 1.009  1.005 - 1.030   pH 6.5  5.0 - 8.0   Glucose, UA NEGATIVE  NEGATIVE mg/dL   Hgb urine dipstick LARGE (*) NEGATIVE   Bilirubin Urine NEGATIVE  NEGATIVE   Ketones, ur NEGATIVE  NEGATIVE mg/dL   Protein, ur NEGATIVE  NEGATIVE mg/dL   Urobilinogen, UA 0.2  0.0 - 1.0 mg/dL   Nitrite NEGATIVE  NEGATIVE   Leukocytes, UA NEGATIVE  NEGATIVE  PREGNANCY, URINE      Result Value Range   Preg Test, Ur NEGATIVE  NEGATIVE  URINE MICROSCOPIC-ADD ON      Result Value Range   Squamous Epithelial / LPF FEW (*) RARE   WBC, UA 0-2  <3 WBC/hpf   RBC / HPF 0-2  <3 RBC/hpf   Bacteria, UA FEW (*) RARE   Urine-Other AMORPHOUS URATES/PHOSPHATES     Dg Chest 2 View  02/14/2013   CLINICAL DATA:  Shortness of breath and cough.  EXAM: CHEST  2 VIEW  COMPARISON:  PA and lateral chest 11/11/2012.  FINDINGS: Heart size and mediastinal contours are within normal limits. Both lungs are clear. Visualized skeletal structures are unremarkable.  IMPRESSION: Negative exam.   Electronically Signed   By: Drusilla Kannerhomas  Dalessio M.D.   On: 02/14/2013 20:53    Patient has run out of her enalapril. MDM  No diagnosis found. There is no evidence of hematuria. Plan prescription enalapril she will [facility] if continues to cough in 3 days. Diagnosis #1 hypertension #2 medication noncompliance #3 morbid obesity #4 cough    Doug SouSam Alexzandria Massman, MD 02/21/13 1117  Doug SouSam Maral Lampe, MD 02/21/13 1119  Doug SouSam Caidence Kaseman, MD 02/21/13 [id number]

## 2013-02-21 NOTE — ED Notes (Signed)
Pt c/o chest pain from coughing

## 2013-06-28 ENCOUNTER — Ambulatory Visit (INDEPENDENT_AMBULATORY_CARE_PROVIDER_SITE_OTHER): Payer: BC Managed Care – PPO | Admitting: Nurse Practitioner

## 2013-06-28 ENCOUNTER — Encounter: Payer: Self-pay | Admitting: Nurse Practitioner

## 2013-06-28 VITALS — BP 132/82 | HR 88 | Ht 65.5 in | Wt >= 6400 oz

## 2013-06-28 DIAGNOSIS — R739 Hyperglycemia, unspecified: Secondary | ICD-10-CM

## 2013-06-28 DIAGNOSIS — Z113 Encounter for screening for infections with a predominantly sexual mode of transmission: Secondary | ICD-10-CM

## 2013-06-28 DIAGNOSIS — N912 Amenorrhea, unspecified: Secondary | ICD-10-CM

## 2013-06-28 DIAGNOSIS — I10 Essential (primary) hypertension: Secondary | ICD-10-CM

## 2013-06-28 DIAGNOSIS — R7309 Other abnormal glucose: Secondary | ICD-10-CM

## 2013-06-28 DIAGNOSIS — Z0000 Encounter for general adult medical examination without abnormal findings: Secondary | ICD-10-CM

## 2013-06-28 DIAGNOSIS — N390 Urinary tract infection, site not specified: Secondary | ICD-10-CM

## 2013-06-28 DIAGNOSIS — Z01419 Encounter for gynecological examination (general) (routine) without abnormal findings: Secondary | ICD-10-CM

## 2013-06-28 LAB — POCT URINALYSIS DIPSTICK
BILIRUBIN UA: NEGATIVE
GLUCOSE UA: 100
Ketones, UA: NEGATIVE
Nitrite, UA: POSITIVE
PH UA: 5
Protein, UA: NEGATIVE
RBC UA: NEGATIVE
UROBILINOGEN UA: NEGATIVE

## 2013-06-28 LAB — POCT URINE PREGNANCY: Preg Test, Ur: NEGATIVE

## 2013-06-28 LAB — HEMOGLOBIN, FINGERSTICK: HEMOGLOBIN, FINGERSTICK: 13 g/dL (ref 12.0–16.0)

## 2013-06-28 MED ORDER — METFORMIN HCL 500 MG PO TABS: 500.0000 mg | ORAL_TABLET | Freq: Every day | ORAL | Status: DC

## 2013-06-28 MED ORDER — NITROFURANTOIN MONOHYD MACRO 100 MG PO CAPS: 100.0000 mg | ORAL_CAPSULE | Freq: Two times a day (BID) | ORAL | Status: DC

## 2013-06-28 MED ORDER — ENALAPRIL MALEATE 5 MG PO TABS: 10.0000 mg | ORAL_TABLET | Freq: Every day | ORAL | Status: DC

## 2013-06-28 NOTE — Progress Notes (Signed)
Patient ID: Laura RossettiKimberly Benson, female   DOB: Jan 20, 1991, 23 y.o.   MRN: [id number] 23 y.o. G0P0 Single African American Fe here for NGYN annual exam.  Menses have always been irregular since age 89.  Went on OCP for about a year.  Then was placed on Depo Provera from age 23 22-16 and stopped secondary to  weight gain.  Since then she has not used any method of birth control and has very irregular cycles at every 2-3 months.  SA with same partner X 2 years.  No method of birth control.  2 years ago wt in 400 lb range. Menses is heavy and longer after a period of amenorrhea.   Example:  Jan 13-18 normal heavy menses        Jan 31- March 11 th - heavy the entire time         April 11 - May 7 th.          Spotting on May 11-14 She feels that she is not well in that she can sleep all the time.  Feels fatigued.  When she is at work she has the energy to do her normal job at [employer]. She has nocturia 3-4 times a night.  [facility] in the past for HTN and now off med's. (I think secondary to cost).     Patient's last menstrual period was 06/06/2013.          Sexually active: yes  The current method of family planning is none.    Exercising: yes  walking everyday with dog Smoker:  no  Health Maintenance: Pap:  2 years ago but not really sure-  ASCUS pap TDaP:  Unknown Gardasil:  Does not ever remember getting this Labs: HB:  13.0  Urine:  Trace WBC, nitrate pos, glucose 100 POCT UPT:  Negative  Random BS = 201   reports that she has quit smoking. She has never used smokeless tobacco. She reports that she does not drink alcohol or use illicit drugs.  Past Medical History  Diagnosis Date  . Hypertension   . Depression     Past Surgical History  Procedure Laterality Date  . Tonsillectomy    . Adenoidectomy      Current Outpatient Prescriptions  Medication Sig Dispense Refill  . enalapril (VASOTEC) 5 MG tablet Take 2 tablets (10 mg total) by mouth  daily.  60 tablet  1  . metFORMIN (GLUCOPHAGE) 500 MG tablet Take 1 tablet (500 mg total) by mouth daily with breakfast.  30 tablet  1  . nitrofurantoin, macrocrystal-monohydrate, (MACROBID) 100 MG capsule Take 1 capsule (100 mg total) by mouth 2 (two) times daily.  14 capsule  0   No current facility-administered medications for this visit.    Family History  Problem Relation Age of Onset  . Hypertension Mother   . Diabetes Mother     ROS:  Pertinent items are noted in HPI.  Otherwise, a comprehensive ROS was negative.  Exam:   BP 132/82  Pulse 88  Ht 5' 5.5" (1.664 m)  Wt 485 lb (219.995 kg)  BMI 79.45 kg/m2  LMP 06/06/2013 Height: 5' 5.5" (166.4 cm)  Ht Readings from Last 3 Encounters:  06/28/13 5' 5.5" (1.664 m)  01/05/09 5' 5.25" (1.657 m) (65%*, Z = 0.38)  09/20/08 5' 5.25" (1.657 m) (65%*, Z = 0.38)   * Growth percentiles are based on CDC 2-20 Years data.    General  appearance: alert, cooperative and appears stated age Head: Normocephalic, without obvious abnormality, atraumatic Neck: no adenopathy, supple, symmetrical, trachea midline and thyroid normal to inspection and palpation Lungs: clear to auscultation bilaterally Breasts: normal appearance, no masses or tenderness, large and dense Heart: regular rate and rhythm Abdomen: soft, non-tender; no masses,  no organomegaly Extremities: extremities normal, atraumatic, no cyanosis or edema Skin: Skin color, texture, turgor normal. No rashes or lesions, no hirsutism Lymph nodes: Cervical, supraclavicular, and axillary nodes normal. No abnormal inguinal nodes palpated Neurologic: Grossly normal   Pelvic: External genitalia:  no lesions              Urethra:  normal appearing urethra with no masses, tenderness or lesions              Bartholin's and Skene's: normal                 Vagina: normal appearing vagina with normal color and discharge, no lesions              Cervix: anteverted              Pap taken:  yes Bimanual Exam:  Uterus:  normal size, contour, position, consistency, mobility, non-tender and very limited exam due to body habitus              Adnexa: no mass, fullness, tenderness               Rectovaginal: Confirms               Anus:  normal sphincter tone, no lesions  A:  Well Woman with normal exam  History of irregular menses - most likely PCOS  History of HTN - noncompliant  Glycosuria and elevated random glucose   History of menorrhagia with current amenorrhea  No method of birth control  R/O STD's  R/O UTI  History of ASCUS pap  P:   Reviewed health and wellness pertinent to exam  Pap smear taken today  Refill on Vasotec 5 mg daily initially, then may need to increase back up to her usual dose of 10 mg.  RX of Glucophage 500 mg daily with breakfast  Rx for Macrobid 100 mg and follow with urine culture  She will return for fasting labs  Discussed  that she may need bariatric surgery   After labs will also get consult with Dr. Carmelina Dane on breast self exam, STD prevention, family planning choices, adequate intake of calcium and vitamin D, diet and exercise return annually or prn  An After Visit Summary was printed and given to the patient.  Mother is Meg Juanetta Gosling

## 2013-06-29 ENCOUNTER — Other Ambulatory Visit (INDEPENDENT_AMBULATORY_CARE_PROVIDER_SITE_OTHER): Payer: BC Managed Care – PPO

## 2013-06-29 DIAGNOSIS — N912 Amenorrhea, unspecified: Secondary | ICD-10-CM

## 2013-06-29 LAB — URINALYSIS, MICROSCOPIC ONLY
Bacteria, UA: NONE SEEN
Casts: NONE SEEN
Crystals: NONE SEEN

## 2013-06-29 LAB — HEMOGLOBIN A1C
HEMOGLOBIN A1C: 9.2 % — AB (ref <5.7)
MEAN PLASMA GLUCOSE: 217 mg/dL — AB (ref <117)

## 2013-06-29 LAB — IPS PAP TEST WITH REFLEX TO HPV

## 2013-06-29 LAB — STD PANEL
HEP B S AG: NEGATIVE
HIV 1&2 Ab, 4th Generation: NONREACTIVE

## 2013-06-29 LAB — HCG, SERUM, QUALITATIVE: PREG SERUM: NEGATIVE

## 2013-06-29 MED ORDER — ENALAPRIL MALEATE 5 MG PO TABS: 10.0000 mg | ORAL_TABLET | Freq: Every day | ORAL | Status: DC

## 2013-06-30 LAB — IPS N GONORRHOEA AND CHLAMYDIA BY PCR

## 2013-06-30 LAB — LIPID PANEL
Cholesterol: 166 mg/dL (ref 0–200)
HDL: 40 mg/dL (ref >39)
LDL CALC: 104 mg/dL — AB (ref 0–99)
Total CHOL/HDL Ratio: 4.2 Ratio
Triglycerides: 108 mg/dL (ref <150)
VLDL: 22 mg/dL (ref 0–40)

## 2013-06-30 LAB — THYROID PANEL WITH TSH
Free Thyroxine Index: 4.2 — ABNORMAL HIGH (ref 1.0–3.9)
T3 Uptake: 38.9 % — ABNORMAL HIGH (ref 22.5–37.0)
T4, Total: 10.9 ug/dL (ref 5.0–12.5)
TSH: 1.059 u[IU]/mL (ref 0.350–4.500)

## 2013-06-30 LAB — URINE CULTURE

## 2013-06-30 LAB — FSH/LH
FSH: 4.8 m[IU]/mL
LH: 5.9 m[IU]/mL

## 2013-06-30 LAB — DHEA-SULFATE: DHEA SO4: 341 ug/dL (ref 35–430)

## 2013-06-30 LAB — INSULIN, FASTING: Insulin fasting, serum: 64 u[IU]/mL — ABNORMAL HIGH (ref 3–28)

## 2013-07-01 ENCOUNTER — Telehealth: Payer: Self-pay | Admitting: Emergency Medicine

## 2013-07-01 ENCOUNTER — Other Ambulatory Visit: Payer: Self-pay | Admitting: Nurse Practitioner

## 2013-07-01 MED ORDER — AZITHROMYCIN 250 MG PO TABS: ORAL_TABLET | ORAL | Status: DC

## 2013-07-01 NOTE — Progress Notes (Signed)
Encounter reviewed by Dr. Conley Simmonds. Our office will check to see if there is a [facility] that offers comprehensive care for morbidly obese patients.

## 2013-07-03 LAB — 17-HYDROXYPROGESTERONE: 17-OH-Progesterone, LC/MS/MS: 17 ng/dL

## 2013-07-04 ENCOUNTER — Telehealth: Payer: Self-pay | Admitting: Emergency Medicine

## 2013-07-04 DIAGNOSIS — N949 Unspecified condition associated with female genital organs and menstrual cycle: Secondary | ICD-10-CM

## 2013-07-04 DIAGNOSIS — N912 Amenorrhea, unspecified: Secondary | ICD-10-CM

## 2013-07-04 DIAGNOSIS — N925 Other specified irregular menstruation: Secondary | ICD-10-CM

## 2013-07-04 DIAGNOSIS — N938 Other specified abnormal uterine and vaginal bleeding: Secondary | ICD-10-CM

## 2013-07-04 NOTE — Telephone Encounter (Signed)
Message copied by Joeseph Amor on Mon Jul 04, 2013  9:51 AM ------      Message from: Ria Comment R      Created: Fri Jul 01, 2013  8:12 AM       Let patient know positive chlamydia and needs treatment with Zithromax 250 mg X 4 tabs.  Health department card, notify partner(S).  Also TOC in 8 weeks.  Please verify that she also got e-mail about her labs and plan to find her a specialist to help with diabetes, HTN, obesity, PCOS.  Order is placed for meds. ------

## 2013-07-04 NOTE — Telephone Encounter (Signed)
Message left to return call to Dianna Ewald at [phone].    

## 2013-07-04 NOTE — Telephone Encounter (Signed)
Message copied by Joeseph Amor on Mon Jul 04, 2013  8:48 AM ------      Message from: Ria Comment R      Created: Fri Jul 01, 2013  8:12 AM       Let patient know positive chlamydia and needs treatment with Zithromax 250 mg X 4 tabs.  Health department card, notify partner(S).  Also TOC in 8 weeks.  Please verify that she also got e-mail about her labs and plan to find her a specialist to help with diabetes, HTN, obesity, PCOS.  Order is placed for meds. ------

## 2013-07-04 NOTE — Telephone Encounter (Signed)
Message from provider given and advised of positive Chlamydia Results.  Advised rx was sent to pharmacy, should take treatment as directed, ensure to take with food. Azithromycin 1 gram PO take at once with food sent to pharmacy of choice.   Advised will need to complete treatment and notify partner(s). Stressed need to notify partner and abstain until treatment. To wait seven days after they have completed treatment. Patient states she has had positive chlamydia testing before, 05/06/12 but she did not complete treatment, but that her partner did. She felt that was adequate. Advised patient that treating only one partner was not adequate and that both partners needed to complete treatment as directed and abstain until 7 days after both have completed treatment. Patient denies symptoms at this time. Lauro Franklin, FNP notified after phone call of this with no further orders received.   Scheduled appointment for 8 week test of cure.  Rescreen scheduled for 08/30/13, patient is agreeable.  Advised reportable condition and that report would be sent to health department.Confidential communicable disease report-Part one completed and faxed to [facility] Department, form [facility]124, faxed with fax confirmation received and original sent to medical records.   Advised to call back with any, she is agreeable and verbalized understanding for very important need for treatment and follow up.   At the time of this call, patient states she has received emails from [place], FNP and understands need for follow up for chronic health conditions. Advised I spoke with [facility], Consuella Lose, and that it was suggested that patient start with primary care and appointment received for new patient appointment with Dr. Dimple Nanas at [facility] at [facility] for 07/21/13 at 11:45, phone and address given, advised patient she will need to call if and r/s if cannot make this  appointment. She states she placed the appointment on her calendar and is agreeable to obtain this primary care appointment.  Routing to provider for final review. Patient agreeable to disposition. Will close encounter

## 2013-07-11 ENCOUNTER — Telehealth: Payer: Self-pay | Admitting: Emergency Medicine

## 2013-07-11 NOTE — Telephone Encounter (Signed)
Calling patient to schedule Pelvic Ultrasound. She is agreeable to scheduled for evaluation of irregular periods. Currently on her cycle at this time and would like to wait until she is done with cycle to schedule.  She is on her way to the pharmacy now to obtain treatment for her recent chlamydia dx. Stressed importance of completing medication as instructed and importance of abstaining until 7 days after treatment.  She has a new PCP appointment with [facility] for 6/25 at 1145 and she is agreeable to scheduling pelvic ultrasound with Dr. Hyacinth MeekerMiller for 07/21/13 at 1400. Pelvic U/S scheduled and patient aware/agreeable to time.  Patient verbalized understanding of the U/S appointment cancellation policy. Advised will need to cancel within 72 business hours (3 business days) or will have $100.00 no show fee placed to account.    Routing to provider for final review. Patient agreeable to disposition. Will close encounter

## 2013-07-21 ENCOUNTER — Encounter: Payer: Self-pay | Admitting: Obstetrics & Gynecology

## 2013-07-21 ENCOUNTER — Ambulatory Visit (INDEPENDENT_AMBULATORY_CARE_PROVIDER_SITE_OTHER): Payer: BC Managed Care – PPO | Admitting: Obstetrics & Gynecology

## 2013-07-21 ENCOUNTER — Other Ambulatory Visit: Payer: Self-pay | Admitting: Obstetrics & Gynecology

## 2013-07-21 ENCOUNTER — Ambulatory Visit (INDEPENDENT_AMBULATORY_CARE_PROVIDER_SITE_OTHER): Payer: BC Managed Care – PPO

## 2013-07-21 VITALS — BP 118/72 | HR 72 | Ht 65.5 in | Wt >= 6400 oz

## 2013-07-21 DIAGNOSIS — N949 Unspecified condition associated with female genital organs and menstrual cycle: Secondary | ICD-10-CM

## 2013-07-21 DIAGNOSIS — A749 Chlamydial infection, unspecified: Secondary | ICD-10-CM

## 2013-07-21 DIAGNOSIS — N912 Amenorrhea, unspecified: Secondary | ICD-10-CM

## 2013-07-21 DIAGNOSIS — N938 Other specified abnormal uterine and vaginal bleeding: Secondary | ICD-10-CM

## 2013-07-21 DIAGNOSIS — N925 Other specified irregular menstruation: Secondary | ICD-10-CM

## 2013-07-21 MED ORDER — NORETHINDRONE 0.35 MG PO TABS: 1.0000 | ORAL_TABLET | Freq: Every day | ORAL | Status: DC

## 2013-07-21 MED ORDER — BUPROPION HCL ER (XL) 150 MG PO TB24: 150.0000 mg | ORAL_TABLET | Freq: Every day | ORAL | Status: DC

## 2013-07-21 MED ORDER — ENALAPRIL MALEATE 5 MG PO TABS: 5.0000 mg | ORAL_TABLET | Freq: Every day | ORAL | Status: DC

## 2013-07-21 NOTE — Progress Notes (Addendum)
23 y.o.Singlefemale here for a pelvic ultrasound.   Pt with hx of morbid obesity and DUB.  Has been on OCPs and DepoProvera.  Had worsening weight gain with DepoProvera so stopped it.  Pt states she would like to have a baby as her boyfriend is 1430 and he is interested in having children.  VERY FRANK discussion with pt regarding her weight and risks with pregnancy.  I HIGHLY recommend that she proceed with evaluation for bariatric surgery.  She is young and has lots of time for pregnancy.  Risks of hypertension with pregnancy, preterm delivery, NICU stay, preeclampsia and eclampsia all d/w pt.  Pregnancy is really not the best option for her.  She is going to the next bariatric information session.  Advised pt she really needs to loose 200 pounds before contemplating pregnancy.  She is receptive to the idea of surgically achieving weight loss.  She does have some fear of boyfriend leaving if she doesn't get pregnant.  D/W pt her current positive STD status and whether partner is best person for her anyway.  Reports she knew about the +STD.  She just never went and got it treated, where he did.  Highly advised, pt for him to be retreated.  She states he has.  D/W pt risks of tubal disease with chronic gonorrhea or chlamydia.  She voices clear understanding.  She has taken the medication for treatment.  Recommend testing today.  Aware this is [facility] but I feel appropriate.  Patient's last menstrual period was 07/07/2013.  Sexually active:  yes  Contraception: no method  FINDINGS: UTERUS: 6.5 x 4.6 x 2.9cm EMS: 3.798mm ADNEXA:   Left ovary 3.0 x 1.9 x 1.8cm   Right ovary 3.4 x 1.7 x 1.4cm CUL DE SAC: no free fluid  Assessment: DUB due to morbid obesity and probable anovulation H/O + chalmydia Hypertension Diabetes  Plan: Highly recommend some treatment for cycles.  As pt has both hypertension and diabetes, I feel should start with progestin only pills.  As pt is  bleeding now, this is a good time to start.  Instructions provided.  Risks/side effects discussed.  GC/CHL testing today.  If positive, will retreat.  If not, can cancel f/u appt  Highly encouraged pt to proceed with getting information regarding bariatric surgery and weight loss options but to also make a decision and proceed.  She states she is motivated to proceed.  ~30 minutes spent with patient >50% of time was in face to face discussion of above.

## 2013-07-22 LAB — GC/CHLAMYDIA PROBE AMP, URINE
CHLAMYDIA, SWAB/URINE, PCR: NEGATIVE
GC Probe Amp, Urine: NEGATIVE

## 2013-07-25 NOTE — Telephone Encounter (Signed)
Encounter opened erroneously.   Closed encounter.   

## 2013-08-11 ENCOUNTER — Ambulatory Visit: Payer: BC Managed Care – PPO

## 2013-08-18 ENCOUNTER — Ambulatory Visit: Payer: BC Managed Care – PPO

## 2013-08-22 LAB — LAB REPORT - SCANNED
Free T4: 0.93 ng/dL
TSH: 2.44 (ref 0.41–5.90)

## 2013-08-23 ENCOUNTER — Encounter: Payer: BC Managed Care – PPO | Attending: Family Medicine

## 2013-08-23 VITALS — Ht 66.0 in | Wt >= 6400 oz

## 2013-08-23 DIAGNOSIS — E119 Type 2 diabetes mellitus without complications: Secondary | ICD-10-CM

## 2013-08-23 DIAGNOSIS — Z713 Dietary counseling and surveillance: Secondary | ICD-10-CM | POA: Insufficient documentation

## 2013-08-23 NOTE — Progress Notes (Signed)
Patient was seen on 08/23/13 for the first of a series of three diabetes self-management courses at [facility].  Patient Education Plan per assessed needs and concerns is to attend four course education program for Diabetes Self Management Education.  Current HbA1c: 9.2%  The following learning objectives were met by the patient during this class:  Describe diabetes  State some common risk factors for diabetes  Defines the role of glucose and insulin  Identifies type of diabetes and pathophysiology  Describe the relationship between diabetes and cardiovascular risk  State the members of the Healthcare Team  States the rationale for glucose monitoring  State when to test glucose  State their individual Target Range  State the importance of logging glucose readings  Describe how to interpret glucose readings  Identifies A1C target  Explain the correlation between A1c and eAG values  State symptoms and treatment of high blood glucose  State symptoms and treatment of low blood glucose  Explain proper technique for glucose testing  Identifies proper sharps disposal  Handouts given during class include:  Living Well with Diabetes book  Carb Counting and Meal Planning book  Meal Plan Card  Carbohydrate guide  Meal planning worksheet  Low Sodium Flavoring Tips  The diabetes portion plate  T2T to eAG Conversion Chart  Diabetes Medications  Diabetes Recommended Care Schedule  Support Group  Diabetes Success Plan  Core Class Satisfaction Survey  Follow-Up Plan:  Attend core 2

## 2013-08-25 ENCOUNTER — Ambulatory Visit: Payer: BC Managed Care – PPO

## 2013-08-30 ENCOUNTER — Encounter: Payer: BC Managed Care – PPO | Attending: Family Medicine

## 2013-08-30 ENCOUNTER — Ambulatory Visit: Payer: BC Managed Care – PPO | Admitting: Nurse Practitioner

## 2013-08-30 DIAGNOSIS — E119 Type 2 diabetes mellitus without complications: Secondary | ICD-10-CM

## 2013-08-30 DIAGNOSIS — Z713 Dietary counseling and surveillance: Secondary | ICD-10-CM | POA: Insufficient documentation

## 2013-08-30 NOTE — Progress Notes (Signed)

## 2013-09-06 ENCOUNTER — Telehealth: Payer: Self-pay | Admitting: Obstetrics & Gynecology

## 2013-09-06 ENCOUNTER — Ambulatory Visit: Payer: BC Managed Care – PPO

## 2013-09-06 DIAGNOSIS — N938 Other specified abnormal uterine and vaginal bleeding: Secondary | ICD-10-CM

## 2013-09-06 NOTE — Telephone Encounter (Signed)
Spoke with patient. She states she has been having vaginal bleeding for 62 days. She was seen by her pcp [facility] two weeks ago and advised her of ongoing vaginal bleeding as she was having some abdominal pain at that time as well. She states that Dr. Ihor DowNnodi advised her to stop her Micronor and start on a new medication. Patient is unsure of that medication but states it starts with an M, patient states she does not feel that medication is helping like it should. Patient states that the bleeding has increased since stopping her Micoronor and is having to change pads 4 times per day, but wearing multiple pads. Denies pain at this time. Taking iron supplementation per patient report that was ordered by Dr. Leward QuanNdnodi's office.   Reviewed with Dr. Hyacinth MeekerMiller, orders received for scheduling appointment on Thursday and order endometrial biopsy.    Spoke with patient and she is agreeable to the appointment with Dr. Hyacinth MeekerMiller for Thursday at 1415. Advised Dr. Hyacinth MeekerMiller is planning for endometrial biopsy and instructions to take Motrin 800 mg po x 1, one hour before appointment with food and water.  Order placed for precert.   Called [facility] and requested records from last visit. [facility]

## 2013-09-06 NOTE — Telephone Encounter (Signed)
Pt says she has been bleeding for 62 days and on birth control.

## 2013-09-06 NOTE — Telephone Encounter (Signed)
Records received. To file in Dr. Rondel BatonMiller's Workstation.

## 2013-09-06 NOTE — Telephone Encounter (Signed)
Pt says she has been bleeding for 62 days on birth control pill.

## 2013-09-08 ENCOUNTER — Ambulatory Visit (INDEPENDENT_AMBULATORY_CARE_PROVIDER_SITE_OTHER): Payer: BC Managed Care – PPO | Admitting: Obstetrics & Gynecology

## 2013-09-08 VITALS — BP 124/78 | HR 80 | Resp 20 | Wt >= 6400 oz

## 2013-09-08 DIAGNOSIS — N949 Unspecified condition associated with female genital organs and menstrual cycle: Secondary | ICD-10-CM

## 2013-09-08 DIAGNOSIS — N925 Other specified irregular menstruation: Secondary | ICD-10-CM

## 2013-09-08 DIAGNOSIS — N938 Other specified abnormal uterine and vaginal bleeding: Secondary | ICD-10-CM

## 2013-09-08 DIAGNOSIS — N920 Excessive and frequent menstruation with regular cycle: Secondary | ICD-10-CM

## 2013-09-08 DIAGNOSIS — N921 Excessive and frequent menstruation with irregular cycle: Secondary | ICD-10-CM

## 2013-09-08 MED ORDER — MEGESTROL ACETATE 40 MG PO TABS
ORAL_TABLET | ORAL | Status: DC
Start: 2013-09-08 — End: 2013-09-08

## 2013-09-08 MED ORDER — MEGESTROL ACETATE 40 MG PO TABS: ORAL_TABLET | ORAL | Status: DC

## 2013-09-12 ENCOUNTER — Other Ambulatory Visit: Payer: Self-pay | Admitting: *Deleted

## 2013-09-12 MED ORDER — METFORMIN HCL 500 MG PO TABS: 500.0000 mg | ORAL_TABLET | Freq: Every day | ORAL | Status: DC

## 2013-09-12 NOTE — Telephone Encounter (Signed)
Fax From: [facility] for Metformin HCL 500 mg  Last Refilled/AEX: 06/28/13 #30/1 refill Recheck scheduled for 10/21/13 Aex Scheduled: 06/30/14 with Ms. Patty  Please advise.

## 2013-09-12 NOTE — Telephone Encounter (Signed)
Faxed fax stating that rx has been sent electronically. 

## 2013-09-13 ENCOUNTER — Telehealth: Payer: Self-pay

## 2013-09-13 NOTE — Telephone Encounter (Signed)
Left message to call Laura Benson at [phone]. 

## 2013-09-13 NOTE — Telephone Encounter (Signed)
Message copied by Jannet AskewHINES, KAITLYN E on Tue Sep 13, 2013  3:35 PM ------      Message from: Jerene BearsMILLER, MARY S      Created: Tue Sep 13, 2013  5:18 AM       Please call pt and inform endometrial biopsy was negative for abnormal cells.  Can you see how she is doing from a bleeding standpoint?  Also, she was going to let me know what her hemoglobin was and the date from her PCP. ------

## 2013-09-15 ENCOUNTER — Encounter: Payer: Self-pay | Admitting: Obstetrics & Gynecology

## 2013-09-15 NOTE — Progress Notes (Addendum)
Subjective:     Patient ID: Laura RossettiKimberly Benson, female   DOB: 05-Sep-1990, 23 y.o.   MRN: [id number]  HPI 23 yo G0 morbidly obese AAF here with increased bleeding.  Pt started on micronor at last visit without much change in bleeding.  It wasn't heavy but didn't stop, either.  Pt recently saw PCP who told her to stop the micronor and do a trial of (probably) Provera.  Pt is unsure of name of medication.  She was advised to take this for 10 days.  Bleeding improved a little but within a couple of days of starting the provera, she starting having much heavier bleeding.  Is wearing two pads at a time, passing some clots, and is worried about how much bleeding she is having.    Although, she has a boyfriend, she states she hasn't been sexually active in several weeks due to all the bleeding.  Denies feeling weak.  Reports she had a hemoglobin with her PCP and the lab result is in her car.  She can't remember the result but was told it was "just a little low".  No palpitations or SOB.   Review of Systems  All other systems reviewed and are negative.      Objective:   Physical Exam  Constitutional: She appears well-developed and well-nourished.  Abdominal: Soft. Bowel sounds are normal. She exhibits no distension. There is no tenderness. There is no rebound and no guarding.  Genitourinary: There is no rash, tenderness or lesion on the right labia. There is no rash, tenderness or lesion on the left labia. Uterus is not deviated, not enlarged and not fixed. Right adnexum displays no mass and no tenderness. Left adnexum displays no mass and no tenderness. There is bleeding (dark and moderate amount, minimal clots) around the vagina.  Lymphadenopathy:       Right: No inguinal adenopathy present.       Left: No inguinal adenopathy present.  Skin: Skin is warm and dry.   Endometrial biopsy recommended due to amount of bleeding she has experienced.  Discussed with patient.  Verbal and written consent  obtained.    Procedure:  Speculum placed.  Cervix visualized and cleansed with betadine prep.  A single toothed tenaculum was applied to the anterior lip of the cervix.  Endometrial pipelle was advanced through the cervix into the endometrial cavity without difficulty.  Pipelle passed to 9cm.  Suction applied and pipelle removed with good tissue sample obtained.  Tenculum removed.  No bleeding noted.  Patient tolerated procedure well.      Assessment:     Menorrhagia without regular cycles     Plan:     Biopsy pending Pt is going to call right back and give me hb value.  May need to repeat blood work. Megace 40mg  TID until bleeding stops, then take bid, then daily.  F/U 1 month.  Already scheduled from prior visit. Daily iron if does not have constipation with this. May be able to use Mirena IUD in this pt as I can see cervix easily on physical exam.      10/06/13 labs obtained from Dr. Campbell StallAkaku Nnodi.  Hb 12.4  Scanned into EPIC.

## 2013-09-15 NOTE — Telephone Encounter (Signed)
Spoke with patient. Results given. Patient agreeable. Patient states that her bleeding has stopped. Patient has hemoglobin level written down in car. Is unable to get to it before our office closes for the day. Patient would like to send Dr.Miller a mychart message later tonight with that information. Advised patient would let Dr.Miller know. Patient agreeable.  Routing to provider for final review. Patient agreeable to disposition. Will close encounter

## 2013-10-07 ENCOUNTER — Encounter: Payer: Self-pay | Admitting: Obstetrics & Gynecology

## 2013-10-21 ENCOUNTER — Ambulatory Visit: Payer: BC Managed Care – PPO | Admitting: Obstetrics & Gynecology

## 2013-10-31 ENCOUNTER — Encounter: Payer: Self-pay | Admitting: Obstetrics & Gynecology

## 2013-10-31 ENCOUNTER — Ambulatory Visit: Payer: BC Managed Care – PPO | Admitting: Obstetrics & Gynecology

## 2013-10-31 ENCOUNTER — Telehealth: Payer: Self-pay | Admitting: Obstetrics & Gynecology

## 2013-10-31 NOTE — Telephone Encounter (Signed)
[name] her appointment with Dr. Hyacinth MeekerMiller today. I called the patient and left message for her to call back to reschedule her 3-4 month recheck.

## 2013-11-03 NOTE — Telephone Encounter (Signed)
Calling patient to r/s missed appointment for follow up dub and +Chlamydia.   Message left to return call to [place] at [phone].

## 2013-11-04 NOTE — Telephone Encounter (Signed)
Message left to return call to Chayson Charters at [phone].    

## 2013-11-15 NOTE — Telephone Encounter (Signed)
OK to close encounter. 

## 2013-11-15 NOTE — Telephone Encounter (Signed)
Message left to return call to [place] at [phone].   Dr. Hyacinth MeekerMiller, patient has be contacted x 4 with no response.  Was to have follow up for DUB and Chlamydia.   Okay to close or needs letter?

## 2013-12-08 ENCOUNTER — Telehealth: Payer: Self-pay | Admitting: Nurse Practitioner

## 2013-12-08 NOTE — Telephone Encounter (Signed)
Return call to patient. Complains of increased heavy bleeding and pain over last two weeks. Midol no longer helps. Advil and Tylenol have never helped. Pain 9/10 but patient is at work. States pad change twice per shift at work from 1045a-5p. Not using contraception and initially denies possibility of pregnancy. Upon questioning, admits she is sexually active and not preventing pregnancy. Menses 10-24 to 10-30 and 11-2 to present. Requesting appointment in am or Monday before she goes to work.  Upon review of previous notes, patient was given Megace at last OV to take 3 times daily till bleeding stops then QD and return in one month. Patient states she ran out of Megace in September and could not come in due to work. Advised will need to review with Dr Hyacinth MeekerMiller and call her back.  Instructs me to leave detailed message on VM because she may not be able to answer phone since she is at work.

## 2013-12-08 NOTE — Telephone Encounter (Signed)
OK to put on schedule at 8:15am tomorrow.

## 2013-12-08 NOTE — Telephone Encounter (Signed)
Patient notified and agreeable to appointment in am. Encounter closed.

## 2013-12-08 NOTE — Telephone Encounter (Signed)
Pt is having a lot of pain and has been bleeding for 2 weeks. Pt wants to see Dr. Hyacinth MeekerMiller.

## 2013-12-09 ENCOUNTER — Ambulatory Visit (INDEPENDENT_AMBULATORY_CARE_PROVIDER_SITE_OTHER): Payer: BC Managed Care – PPO | Admitting: Obstetrics & Gynecology

## 2013-12-09 VITALS — BP 128/80 | HR 78 | Resp 16 | Ht 66.0 in | Wt >= 6400 oz

## 2013-12-09 DIAGNOSIS — N946 Dysmenorrhea, unspecified: Secondary | ICD-10-CM

## 2013-12-09 MED ORDER — MEGESTROL ACETATE 40 MG PO TABS: ORAL_TABLET | ORAL | Status: DC

## 2013-12-09 NOTE — Patient Instructions (Signed)
  Patient will need to call [phone] to schedule in person seminar for bariatrics, also online can schedule online seminar  [email or web address]

## 2013-12-09 NOTE — Progress Notes (Signed)
Megace rx printed.  Called in Megace 40 mg #60/3 rfs Take BID until bleeding stops and then continue daily.   Patient is aware.

## 2013-12-09 NOTE — Progress Notes (Signed)
Subjective:     Patient ID: Laura Benson, female   DOB: 17-May-1990, 23 y.o.   MRN: [id number]  HPI 23 yo G0 morbidly obese (>475#) AA female here for follow up of irregular cycles and menorrhagia.  Pt aware I feel her obesity is contributing to anovulation but when she does cycle, her flow is very heavy and long and associated with clotting and cramping.  Pt was treated with micronor which did not help her bleeding and then megace which did actually keep her from bleeding, until she ran out.  Reports when she was on it, she did not bleed at all.  Denies side effects.  Pt wants to talk about bariatric surgery.  I have spoken very seriously to pt about this in the past.  She really needs to loose a significant amount of weight that I think may be too difficult for her to do on her own.  She admits her family does not want her to do the surgery but she is beginning to have knee and hip issues and she worries about the future.  I support her with this decision.  Bariatric pathway discussed.  Information provided.  Pt aware must first go to an information session.  Registration, on-line or via phone, discussed with pt.  Review of Systems  All other systems reviewed and are negative.      Objective:   Physical Exam  Constitutional: She is oriented to person, place, and time. She appears well-developed and well-nourished.  Abdominal: Soft. Bowel sounds are normal. She exhibits no distension and no mass. There is no tenderness. There is no rebound and no guarding.  Genitourinary: Uterus normal. There is no rash or tenderness on the right labia. There is no rash or tenderness on the left labia. Cervix exhibits no discharge. Right adnexum displays no mass and no tenderness. Left adnexum displays no mass and no tenderness. There is bleeding (with small clots) in the vagina.  Exam difficult due to obesity.  Lymphadenopathy:       Right: No inguinal adenopathy present.       Left: No inguinal adenopathy  present.  Neurological: She is alert and oriented to person, place, and time.  Skin: Skin is warm and dry.  Psychiatric: She has a normal mood and affect.       Assessment:     Dysmenorrhea DUB  Morbid obesity    Plan:     Megace 40mg .  Take BID until bleeding stops then daily.  Pt knows to just stay on this.  She is clearly aware this does NOT give her contraception so she needs to use condoms.   Information regarding bariatric surgery and the bariatric pathway discussed.  Pt voices understanding.  Grateful that I was willing to talk with her again today about this.     ~30 minutes spent with patient >50% of time was in face to face discussion of above.

## 2013-12-25 ENCOUNTER — Encounter: Payer: Self-pay | Admitting: Obstetrics & Gynecology

## 2013-12-25 DIAGNOSIS — N946 Dysmenorrhea, unspecified: Secondary | ICD-10-CM | POA: Insufficient documentation

## 2014-01-11 ENCOUNTER — Encounter: Payer: Self-pay | Admitting: Obstetrics & Gynecology

## 2014-04-03 ENCOUNTER — Encounter: Payer: Self-pay | Admitting: Obstetrics & Gynecology

## 2014-06-30 ENCOUNTER — Ambulatory Visit: Payer: BC Managed Care – PPO | Admitting: Nurse Practitioner

## 2014-07-24 ENCOUNTER — Other Ambulatory Visit: Payer: Self-pay

## 2015-02-13 ENCOUNTER — Encounter (HOSPITAL_COMMUNITY): Payer: Self-pay | Admitting: Emergency Medicine

## 2015-02-13 ENCOUNTER — Emergency Department (HOSPITAL_COMMUNITY)
Admission: EM | Admit: 2015-02-13 | Discharge: 2015-02-14 | Disposition: A | Discharge status: Home / Self Care | Payer: BLUE CROSS/BLUE SHIELD | Attending: Emergency Medicine | Admitting: Emergency Medicine

## 2015-02-13 DIAGNOSIS — N939 Abnormal uterine and vaginal bleeding, unspecified: Secondary | ICD-10-CM | POA: Diagnosis not present

## 2015-02-13 DIAGNOSIS — Z7984 Long term (current) use of oral hypoglycemic drugs: Secondary | ICD-10-CM | POA: Insufficient documentation

## 2015-02-13 DIAGNOSIS — E119 Type 2 diabetes mellitus without complications: Secondary | ICD-10-CM | POA: Insufficient documentation

## 2015-02-13 DIAGNOSIS — I10 Essential (primary) hypertension: Secondary | ICD-10-CM | POA: Insufficient documentation

## 2015-02-13 DIAGNOSIS — N760 Acute vaginitis: Secondary | ICD-10-CM | POA: Insufficient documentation

## 2015-02-13 DIAGNOSIS — N898 Other specified noninflammatory disorders of vagina: Secondary | ICD-10-CM | POA: Diagnosis present

## 2015-02-13 DIAGNOSIS — Z87891 Personal history of nicotine dependence: Secondary | ICD-10-CM | POA: Insufficient documentation

## 2015-02-13 DIAGNOSIS — Z8659 Personal history of other mental and behavioral disorders: Secondary | ICD-10-CM | POA: Diagnosis not present

## 2015-02-13 DIAGNOSIS — Z79899 Other long term (current) drug therapy: Secondary | ICD-10-CM | POA: Insufficient documentation

## 2015-02-13 DIAGNOSIS — B9689 Other specified bacterial agents as the cause of diseases classified elsewhere: Secondary | ICD-10-CM

## 2015-02-13 LAB — COMPREHENSIVE METABOLIC PANEL
ALBUMIN: 4.2 g/dL (ref 3.5–5.0)
ALT: 24 U/L (ref 14–54)
AST: 23 U/L (ref 15–41)
Alkaline Phosphatase: 76 U/L (ref 38–126)
Anion gap: 12 (ref 5–15)
BUN: 8 mg/dL (ref 6–20)
CHLORIDE: 105 mmol/L (ref 101–111)
CO2: 24 mmol/L (ref 22–32)
CREATININE: 0.72 mg/dL (ref 0.44–1.00)
Calcium: 9.4 mg/dL (ref 8.9–10.3)
GFR calc Af Amer: >60 mL/min (ref >60)
GLUCOSE: 171 mg/dL — AB (ref 65–99)
Potassium: 4 mmol/L (ref 3.5–5.1)
Sodium: 141 mmol/L (ref 135–145)
Total Bilirubin: 0.8 mg/dL (ref 0.3–1.2)
Total Protein: 7.8 g/dL (ref 6.5–8.1)

## 2015-02-13 LAB — WET PREP, GENITAL
Sperm: NONE SEEN
Trich, Wet Prep: NONE SEEN
Yeast Wet Prep HPF POC: NONE SEEN

## 2015-02-13 LAB — URINE MICROSCOPIC-ADD ON

## 2015-02-13 LAB — URINALYSIS, ROUTINE W REFLEX MICROSCOPIC
Bilirubin Urine: NEGATIVE
GLUCOSE, UA: 100 mg/dL — AB
KETONES UR: NEGATIVE mg/dL
LEUKOCYTES UA: NEGATIVE
Nitrite: NEGATIVE
PH: 5.5 (ref 5.0–8.0)
Protein, ur: NEGATIVE mg/dL
Specific Gravity, Urine: 1.025 (ref 1.005–1.030)

## 2015-02-13 LAB — I-STAT BETA HCG BLOOD, ED (MC, WL, AP ONLY): I-stat hCG, quantitative: <5 m[IU]/mL (ref <5)

## 2015-02-13 LAB — CBC
HCT: 41.4 % (ref 36.0–46.0)
Hemoglobin: 13.1 g/dL (ref 12.0–15.0)
MCH: 24 pg — AB (ref 26.0–34.0)
MCHC: 31.6 g/dL (ref 30.0–36.0)
MCV: 76 fL — AB (ref 78.0–100.0)
Platelets: 288 10*3/uL (ref 150–400)
RBC: 5.45 MIL/uL — ABNORMAL HIGH (ref 3.87–5.11)
RDW: 14.5 % (ref 11.5–15.5)
WBC: 6.1 10*3/uL (ref 4.0–10.5)

## 2015-02-13 LAB — LIPASE, BLOOD: Lipase: 23 U/L (ref 11–51)

## 2015-02-13 NOTE — ED Notes (Signed)
Pt states left sided flank/abdominal pain with pink vaginal discharge x 2 days. Pt states there is a chance she could be pregnant and has had sex with new unprotected partner recently. Denies any difficulty with urination. States her last period was two weeks ago and was much shorter than her normal period. Also complaining of diarrhea, denies N/V, fever/chills.

## 2015-02-13 NOTE — ED Provider Notes (Signed)
CSN: [id number]     Arrival date & time 02/13/15  1710 History   First MD Initiated Contact with Patient 02/13/15 2144     Chief Complaint  Patient presents with  . Flank Pain  . Abdominal Pain  . Vaginal Discharge     (Consider location/radiation/quality/duration/timing/severity/associated sxs/prior Treatment) HPI Comments: Patient presents to the emergency department with chief complaint of lower abdominal pain and pink vaginal discharge 2 days. She states that the pain radiates to her left flank. She denies any dysuria. She denies any fevers chills. Denies any nausea or vomiting. She states that she has had unprotected sex with new partner recently. She states that she has a history of abnormal uterine bleeding, and takes Megace for this. She states that her last period was about 2 weeks ago, but was much shorter than her normal period. She has tried taking ibuprofen with no relief. There are no aggravating or alleviating factors.  The history is provided by the patient. No language interpreter was used.    Past Medical History  Diagnosis Date  . Hypertension   . Depression   . Diabetes mellitus without complication [facility])    Past Surgical History  Procedure Laterality Date  . Tonsillectomy    . Adenoidectomy     Family History  Problem Relation Age of Onset  . Hypertension Mother   . Diabetes Mother    Social History  Substance Use Topics  . Smoking status: Former Games developer  . Smokeless tobacco: Never Used  . Alcohol Use: No   OB History    Gravida Para Term Preterm AB TAB SAB Ectopic Multiple Living   0              Review of Systems  Constitutional: Negative for fever and chills.  Respiratory: Negative for shortness of breath.   Cardiovascular: Negative for chest pain.  Gastrointestinal: Negative for nausea, vomiting, diarrhea and constipation.  Genitourinary: Positive for vaginal bleeding. Negative for dysuria.  All other systems reviewed and are  negative.     Allergies  Review of patient's allergies indicates no known allergies.  Home Medications   Prior to Admission medications   Medication Sig Start Date End Date Taking? Authorizing Provider  enalapril (VASOTEC) 5 MG tablet Take 1 tablet (5 mg total) by mouth daily. 07/21/13  Yes Jerene Bears, MD  guaiFENesin-dextromethorphan (ROBITUSSIN DM) 100-10 MG/5ML syrup Take 5 mLs by mouth every 4 (four) hours as needed for cough.   Yes Historical Provider, MD  metFORMIN (GLUCOPHAGE) 500 MG tablet Take 1 tablet (500 mg total) by mouth daily with breakfast. 09/12/13  Yes Ria Comment, FNP  megestrol (MEGACE) 40 MG tablet Take BID until bleeding stops and then continue daily. Patient taking differently: 40 mg daily.  12/09/13   Jerene Bears, MD   BP 129/92 mmHg  Pulse 101  Temp(Src) 98.1 F (36.7 C) (Oral)  Resp 24  SpO2 94%  LMP 01/30/2015 Physical Exam  Constitutional: She is oriented to person, place, and time. She appears well-developed and well-nourished.  HENT:  Head: Normocephalic and atraumatic.  Eyes: Conjunctivae and EOM are normal. Pupils are equal, round, and reactive to light.  Neck: Normal range of motion. Neck supple.  Cardiovascular: Normal rate and regular rhythm.  Exam reveals no gallop and no friction rub.   No murmur heard. Pulmonary/Chest: Effort normal and breath sounds normal. No respiratory distress. She has no wheezes. She has no rales. She exhibits no tenderness.  Abdominal: Soft. Bowel sounds  are normal. She exhibits no distension and no mass. There is no tenderness. There is no rebound and no guarding.  Musculoskeletal: Normal range of motion. She exhibits no edema or tenderness.  Neurological: She is alert and oriented to person, place, and time.  Skin: Skin is warm and dry.  Psychiatric: She has a normal mood and affect. Her behavior is normal. Judgment and thought content normal.  Nursing note and vitals reviewed.   ED Course  Procedures  (including critical care time) Results for orders placed or performed during the hospital encounter of 02/13/15  Wet prep, genital  Result Value Ref Range   Yeast Wet Prep HPF POC NONE SEEN NONE SEEN   Trich, Wet Prep NONE SEEN NONE SEEN   Clue Cells Wet Prep HPF POC FEW (A) NONE SEEN   WBC, Wet Prep HPF POC RARE (A) NONE SEEN   Sperm NONE SEEN   Lipase, blood  Result Value Ref Range   Lipase 23 11 - 51 U/L  Comprehensive metabolic panel  Result Value Ref Range   Sodium 141 135 - 145 mmol/L   Potassium 4.0 3.5 - 5.1 mmol/L   Chloride 105 101 - 111 mmol/L   CO2 24 22 - 32 mmol/L   Glucose, Bld 171 (H) 65 - 99 mg/dL   BUN 8 6 - 20 mg/dL   Creatinine, Ser 1.61 0.44 - 1.00 mg/dL   Calcium 9.4 8.9 - 09.6 mg/dL   Total Protein 7.8 6.5 - 8.1 g/dL   Albumin 4.2 3.5 - 5.0 g/dL   AST 23 15 - 41 U/L   ALT 24 14 - 54 U/L   Alkaline Phosphatase 76 38 - 126 U/L   Total Bilirubin 0.8 0.3 - 1.2 mg/dL   GFR calc non Af Amer >60 >60 mL/min   GFR calc Af Amer >60 >60 mL/min   Anion gap 12 5 - 15  CBC  Result Value Ref Range   WBC 6.1 4.0 - 10.5 K/uL   RBC 5.45 (H) 3.87 - 5.11 MIL/uL   Hemoglobin 13.1 12.0 - 15.0 g/dL   HCT 04.5 40.9 - 81.1 %   MCV 76.0 (L) 78.0 - 100.0 fL   MCH 24.0 (L) 26.0 - 34.0 pg   MCHC 31.6 30.0 - 36.0 g/dL   RDW 91.4 78.2 - 95.6 %   Platelets 288 150 - 400 K/uL  Urinalysis, Routine w reflex microscopic (not at [facility])  Result Value Ref Range   Color, Urine AMBER (A) YELLOW   APPearance CLOUDY (A) CLEAR   Specific Gravity, Urine 1.025 1.005 - 1.030   pH 5.5 5.0 - 8.0   Glucose, UA 100 (A) NEGATIVE mg/dL   Hgb urine dipstick LARGE (A) NEGATIVE   Bilirubin Urine NEGATIVE NEGATIVE   Ketones, ur NEGATIVE NEGATIVE mg/dL   Protein, ur NEGATIVE NEGATIVE mg/dL   Nitrite NEGATIVE NEGATIVE   Leukocytes, UA NEGATIVE NEGATIVE  Urine microscopic-add on  Result Value Ref Range   Squamous Epithelial / LPF 6-30 (A) NONE SEEN   WBC, UA 0-5 0 - 5 WBC/hpf   RBC / HPF TOO  NUMEROUS TO COUNT 0 - 5 RBC/hpf   Bacteria, UA MANY (A) NONE SEEN   Urine-Other MUCOUS PRESENT   I-Stat beta hCG blood, ED ([facility], [facility], [facility] only)  Result Value Ref Range   I-stat hCG, quantitative <5.0 <5 mIU/mL   Comment 3           No results found.  I have personally reviewed and  evaluated these images and lab results as part of my medical decision-making.    MDM   Final diagnoses:  Abnormal uterine bleeding  Vaginal discharge  BV (bacterial vaginosis)    Patient with lower abdominal discomfort. Abdomen is soft and nontender on exam, no evidence of acute abdomen. Vital signs are stable, labs are reassuring. Doubt any emergent or acute process requiring admission to the hospital. Patient does report recent unprotected sex. She is having some vaginal discharge. Will check pelvic exam. Anticipate discharge to home with OB/GYN follow-up.  Clue cells seen on wet prep. Will treat with Flagyl. Also given recent unprotected sex contact, will give Rocephin and azithromycin.     Roxy Horseman, PA-C 02/14/15 0015  Arby Barrette, MD 02/14/15 [id number]

## 2015-02-14 LAB — GC/CHLAMYDIA PROBE AMP (~~LOC~~) NOT AT ARMC
Chlamydia: NEGATIVE
Neisseria Gonorrhea: NEGATIVE

## 2015-02-14 MED ORDER — METRONIDAZOLE 500 MG PO TABS: 500.0000 mg | ORAL_TABLET | Freq: Two times a day (BID) | ORAL | Status: DC

## 2015-02-14 MED ORDER — CEFTRIAXONE SODIUM 250 MG IJ SOLR
250.0000 mg | Freq: Once | INTRAMUSCULAR | Status: AC
  Administered 2015-02-14: 250 mg via INTRAMUSCULAR
  Filled 2015-02-14: qty 250

## 2015-02-14 MED ORDER — AZITHROMYCIN 250 MG PO TABS
1000.0000 mg | ORAL_TABLET | Freq: Once | ORAL | Status: AC
  Administered 2015-02-14: 1000 mg via ORAL
  Filled 2015-02-14: qty 4

## 2015-02-14 MED ORDER — STERILE WATER FOR INJECTION IJ SOLN
INTRAMUSCULAR | Status: AC
  Filled 2015-02-14: qty 10

## 2015-02-14 NOTE — Discharge Instructions (Signed)
Abnormal Uterine Bleeding °Abnormal uterine bleeding can affect women at various stages in life, including teenagers, women in their reproductive years, pregnant women, and women who have reached menopause. Several kinds of uterine bleeding are considered abnormal, including: °· Bleeding or spotting between periods.   °· Bleeding after sexual intercourse.   °· Bleeding that is heavier or more than normal.   °· Periods that last longer than usual. °· Bleeding after menopause.   °Many cases of abnormal uterine bleeding are minor and simple to treat, while others are more serious. Any type of abnormal bleeding should be evaluated by your health care provider. Treatment will depend on the cause of the bleeding. °HOME CARE INSTRUCTIONS °Monitor your condition for any changes. The following actions may help to alleviate any discomfort you are experiencing: °· Avoid the use of tampons and douches as directed by your health care provider. °· Change your pads frequently. °You should get regular pelvic exams and Pap tests. Keep all follow-up appointments for diagnostic tests as directed by your health care provider.  °SEEK MEDICAL CARE IF:  °· Your bleeding lasts more than 1 week.   °· You feel dizzy at times.   °SEEK IMMEDIATE MEDICAL CARE IF:  °· You pass out.   °· You are changing pads every 15 to 30 minutes.   °· You have abdominal pain. °· You have a fever.   °· You become sweaty or weak.   °· You are passing large blood clots from the vagina.   °· You start to feel nauseous and vomit. °MAKE SURE YOU:  °· Understand these instructions. °· Will watch your condition. °· Will get help right away if you are not doing well or get worse. °  °This information is not intended to replace advice given to you by your health care provider. Make sure you discuss any questions you have with your health care provider. °  °Document Released: 01/13/2005 Document Revised: 01/18/2013 Document Reviewed: 08/12/2012 °Elsevier Interactive  Patient Education ©2016 [employer] Inc[employer] °Bacterial Vaginosis °Bacterial vaginosis is a vaginal infection that occurs when the normal balance of bacteria in the vagina is disrupted. It results from an overgrowth of certain bacteria. This is the most common vaginal infection in women of childbearing age. Treatment is important to prevent complications, especially in pregnant women, as it can cause a premature delivery. °CAUSES  °Bacterial vaginosis is caused by an increase in harmful bacteria that are normally present in smaller amounts in the vagina. Several different kinds of bacteria can cause bacterial vaginosis. However, the reason that the condition develops is not fully understood. °RISK FACTORS °Certain activities or behaviors can put you at an increased risk of developing bacterial vaginosis, including: °· Having a new sex partner or multiple sex partners. °· Douching. °· Using an intrauterine device (IUD) for contraception. °Women do not get bacterial vaginosis from toilet seats, bedding, swimming pools, or contact with objects around them. °SIGNS AND SYMPTOMS  °Some women with bacterial vaginosis have no signs or symptoms. Common symptoms include: °· Grey vaginal discharge. °· A fishlike odor with discharge, especially after sexual intercourse. °· Itching or burning of the vagina and vulva. °· Burning or pain with urination. °DIAGNOSIS  °Your health care provider will take a medical history and examine the vagina for signs of bacterial vaginosis. A sample of vaginal fluid may be taken. Your health care provider will look at this sample under a microscope to check for bacteria and abnormal cells. A vaginal pH test may also be done.  °TREATMENT  °Bacterial vaginosis may be treated   with antibiotic medicines. These may be given in the form of a pill or a vaginal cream. A second round of antibiotics may be prescribed if the condition comes back after treatment. Because bacterial vaginosis increases your risk for  sexually transmitted diseases, getting treated can help reduce your risk for chlamydia, gonorrhea, HIV, and herpes. °HOME CARE INSTRUCTIONS  °· Only take over-the-counter or prescription medicines as directed by your health care provider. °· If antibiotic medicine was prescribed, take it as directed. Make sure you finish it even if you start to feel better. °· Tell all sexual partners that you have a vaginal infection. They should see their health care provider and be treated if they have problems, such as a mild rash or itching. °· During treatment, it is important that you follow these instructions: °¨ Avoid sexual activity or use condoms correctly. °¨ Do not douche. °¨ Avoid alcohol as directed by your health care provider. °¨ Avoid breastfeeding as directed by your health care provider. °SEEK MEDICAL CARE IF:  °· Your symptoms are not improving after 3 days of treatment. °· You have increased discharge or pain. °· You have a fever. °MAKE SURE YOU:  °· Understand these instructions. °· Will watch your condition. °· Will get help right away if you are not doing well or get worse. °FOR MORE INFORMATION  °Centers for Disease Control and Prevention, Division of STD Prevention: www.cdc.gov/std °American Sexual Health Association (ASHA): www.ashastd.org  °  °This information is not intended to replace advice given to you by your health care provider. Make sure you discuss any questions you have with your health care provider. °  °Document Released: 01/13/2005 Document Revised: 02/03/2014 Document Reviewed: 08/25/2012 °Elsevier Interactive Patient Education ©2016 Elsevier Inc. ° °

## 2015-05-23 ENCOUNTER — Inpatient Hospital Stay (EMERGENCY_DEPARTMENT_HOSPITAL)
Admission: AD | Admit: 2015-05-23 | Discharge: 2015-05-23 | Disposition: A | Discharge status: Home / Self Care | Payer: BLUE CROSS/BLUE SHIELD | Source: Ambulatory Visit | Attending: Family Medicine | Admitting: Family Medicine

## 2015-05-23 ENCOUNTER — Encounter (HOSPITAL_COMMUNITY): Payer: Self-pay | Admitting: *Deleted

## 2015-05-23 ENCOUNTER — Emergency Department (HOSPITAL_COMMUNITY): Payer: BLUE CROSS/BLUE SHIELD

## 2015-05-23 ENCOUNTER — Emergency Department (HOSPITAL_COMMUNITY)
Admission: EM | Admit: 2015-05-23 | Discharge: 2015-05-23 | Disposition: A | Discharge status: Home / Self Care | Payer: BLUE CROSS/BLUE SHIELD | Attending: Emergency Medicine | Admitting: Emergency Medicine

## 2015-05-23 DIAGNOSIS — J029 Acute pharyngitis, unspecified: Secondary | ICD-10-CM | POA: Insufficient documentation

## 2015-05-23 DIAGNOSIS — R197 Diarrhea, unspecified: Secondary | ICD-10-CM | POA: Insufficient documentation

## 2015-05-23 DIAGNOSIS — F329 Major depressive disorder, single episode, unspecified: Secondary | ICD-10-CM | POA: Insufficient documentation

## 2015-05-23 DIAGNOSIS — R0602 Shortness of breath: Secondary | ICD-10-CM | POA: Diagnosis not present

## 2015-05-23 DIAGNOSIS — I10 Essential (primary) hypertension: Secondary | ICD-10-CM

## 2015-05-23 DIAGNOSIS — A084 Viral intestinal infection, unspecified: Secondary | ICD-10-CM

## 2015-05-23 DIAGNOSIS — Z87891 Personal history of nicotine dependence: Secondary | ICD-10-CM | POA: Insufficient documentation

## 2015-05-23 DIAGNOSIS — R1010 Upper abdominal pain, unspecified: Secondary | ICD-10-CM | POA: Insufficient documentation

## 2015-05-23 DIAGNOSIS — R42 Dizziness and giddiness: Secondary | ICD-10-CM

## 2015-05-23 DIAGNOSIS — F419 Anxiety disorder, unspecified: Secondary | ICD-10-CM | POA: Diagnosis not present

## 2015-05-23 DIAGNOSIS — Z7984 Long term (current) use of oral hypoglycemic drugs: Secondary | ICD-10-CM | POA: Diagnosis not present

## 2015-05-23 DIAGNOSIS — Z9889 Other specified postprocedural states: Secondary | ICD-10-CM | POA: Insufficient documentation

## 2015-05-23 DIAGNOSIS — R4781 Slurred speech: Secondary | ICD-10-CM | POA: Insufficient documentation

## 2015-05-23 DIAGNOSIS — E119 Type 2 diabetes mellitus without complications: Secondary | ICD-10-CM | POA: Insufficient documentation

## 2015-05-23 DIAGNOSIS — Z79899 Other long term (current) drug therapy: Secondary | ICD-10-CM | POA: Insufficient documentation

## 2015-05-23 LAB — COMPREHENSIVE METABOLIC PANEL
ALBUMIN: 3.9 g/dL (ref 3.5–5.0)
ALT: 18 U/L (ref 14–54)
ANION GAP: 6 (ref 5–15)
AST: 16 U/L (ref 15–41)
Alkaline Phosphatase: 72 U/L (ref 38–126)
BUN: 9 mg/dL (ref 6–20)
CALCIUM: 9 mg/dL (ref 8.9–10.3)
CHLORIDE: 105 mmol/L (ref 101–111)
CO2: 27 mmol/L (ref 22–32)
Creatinine, Ser: 0.79 mg/dL (ref 0.44–1.00)
GFR calc non Af Amer: >60 mL/min (ref >60)
GLUCOSE: 191 mg/dL — AB (ref 65–99)
POTASSIUM: 3.8 mmol/L (ref 3.5–5.1)
SODIUM: 138 mmol/L (ref 135–145)
Total Bilirubin: 0.8 mg/dL (ref 0.3–1.2)
Total Protein: 7.5 g/dL (ref 6.5–8.1)

## 2015-05-23 LAB — CBC WITH DIFFERENTIAL/PLATELET
BASOS PCT: 0 %
Basophils Absolute: 0 10*3/uL (ref 0.0–0.1)
EOS ABS: 0.1 10*3/uL (ref 0.0–0.7)
EOS PCT: 2 %
HCT: 40.6 % (ref 36.0–46.0)
HEMOGLOBIN: 13.1 g/dL (ref 12.0–15.0)
LYMPHS ABS: 2.3 10*3/uL (ref 0.7–4.0)
Lymphocytes Relative: 39 %
MCH: 23.9 pg — AB (ref 26.0–34.0)
MCHC: 32.3 g/dL (ref 30.0–36.0)
MCV: 74.2 fL — ABNORMAL LOW (ref 78.0–100.0)
MONO ABS: 0.5 10*3/uL (ref 0.1–1.0)
MONOS PCT: 8 %
NEUTROS PCT: 51 %
Neutro Abs: 3 10*3/uL (ref 1.7–7.7)
PLATELETS: 270 10*3/uL (ref 150–400)
RBC: 5.47 MIL/uL — ABNORMAL HIGH (ref 3.87–5.11)
RDW: 14.4 % (ref 11.5–15.5)
WBC: 5.7 10*3/uL (ref 4.0–10.5)

## 2015-05-23 LAB — URINALYSIS, ROUTINE W REFLEX MICROSCOPIC
Bilirubin Urine: NEGATIVE
GLUCOSE, UA: NEGATIVE mg/dL
Hgb urine dipstick: NEGATIVE
KETONES UR: NEGATIVE mg/dL
LEUKOCYTES UA: NEGATIVE
Nitrite: NEGATIVE
PH: 6 (ref 5.0–8.0)
Protein, ur: NEGATIVE mg/dL
SPECIFIC GRAVITY, URINE: 1.02 (ref 1.005–1.030)

## 2015-05-23 LAB — WET PREP, GENITAL
Clue Cells Wet Prep HPF POC: NONE SEEN
SPERM: NONE SEEN
TRICH WET PREP: NONE SEEN
Yeast Wet Prep HPF POC: NONE SEEN

## 2015-05-23 LAB — POCT PREGNANCY, URINE: Preg Test, Ur: NEGATIVE

## 2015-05-23 LAB — GLUCOSE, CAPILLARY: Glucose-Capillary: 155 mg/dL — ABNORMAL HIGH (ref 65–99)

## 2015-05-23 LAB — HIV ANTIBODY (ROUTINE TESTING W REFLEX): HIV Screen 4th Generation wRfx: NONREACTIVE

## 2015-05-23 LAB — RPR: RPR: NONREACTIVE

## 2015-05-23 MED ORDER — METFORMIN HCL 500 MG PO TABS: 500.0000 mg | ORAL_TABLET | Freq: Every day | ORAL | Status: DC

## 2015-05-23 MED ORDER — HYDROMORPHONE HCL 1 MG/ML IJ SOLN
1.0000 mg | Freq: Once | INTRAMUSCULAR | Status: AC
  Administered 2015-05-23: 1 mg via INTRAMUSCULAR
  Filled 2015-05-23: qty 1

## 2015-05-23 MED ORDER — OXYCODONE-ACETAMINOPHEN 5-325 MG PO TABS: 1.0000 | ORAL_TABLET | Freq: Four times a day (QID) | ORAL | Status: DC | PRN

## 2015-05-23 MED ORDER — ENALAPRIL MALEATE 5 MG PO TABS: 5.0000 mg | ORAL_TABLET | Freq: Every day | ORAL | Status: DC

## 2015-05-23 MED ORDER — ENALAPRIL MALEATE 5 MG PO TABS
5.0000 mg | ORAL_TABLET | Freq: Once | ORAL | Status: AC
  Administered 2015-05-23: 5 mg via ORAL
  Filled 2015-05-23: qty 1

## 2015-05-23 NOTE — MAU Note (Addendum)
PT SAYS  SHE STARTED FEELING  SHARP PAIN 1 WEEK AGO  ON 1 SIDE LOWER ABD- THEN MOVES  TO OTHER SIDE.   HAS NAUSEA- X2 WEEKS-   NO VOMITING.     DIARRHEA-  WATERY  - STARTED Tuesday AM.  NO BIRTH CONTROL.  LAST SEX-  4-16.   ALSO DIABETIC- TAKES METAFORMIN-   STOPPED   2 MTHS  AGO.-  HAS NOT CHECKED   BS

## 2015-05-23 NOTE — ED Notes (Signed)
Patient transported to X-ray 

## 2015-05-23 NOTE — Discharge Instructions (Signed)
Food Choices to Help Relieve Diarrhea, Adult °When you have diarrhea, the foods you eat and your eating habits are very important. Choosing the right foods and drinks can help relieve diarrhea. Also, because diarrhea can last up to 7 days, you need to replace lost fluids and electrolytes (such as sodium, potassium, and chloride) in order to help prevent dehydration.  °WHAT GENERAL GUIDELINES DO I NEED TO FOLLOW? °· Slowly drink 1 cup (8 oz) of fluid for each episode of diarrhea. If you are getting enough fluid, your urine will be clear or pale yellow. °· Eat starchy foods. Some good choices include white rice, white toast, pasta, low-fiber cereal, baked potatoes (without the skin), saltine crackers, and bagels. °· Avoid large servings of any cooked vegetables. °· Limit fruit to two servings per day. A serving is ½ cup or 1 small piece. °· Choose foods with less than 2 g of fiber per serving. °· Limit fats to less than 8 tsp (38 g) per day. °· Avoid fried foods. °· Eat foods that have probiotics in them. Probiotics can be found in certain dairy products. °· Avoid foods and beverages that may increase the speed at which food moves through the stomach and intestines (gastrointestinal tract). Things to avoid include: °¨ High-fiber foods, such as dried fruit, raw fruits and vegetables, nuts, seeds, and whole grain foods. °¨ Spicy foods and high-fat foods. °¨ Foods and beverages sweetened with high-fructose corn syrup, honey, or sugar alcohols such as xylitol, sorbitol, and mannitol. °WHAT FOODS ARE RECOMMENDED? °Grains °White rice. White, French, or pita breads (fresh or toasted), including plain rolls, buns, or bagels. White pasta. Saltine, soda, or graham crackers. Pretzels. Low-fiber cereal. Cooked cereals made with water (such as cornmeal, farina, or cream cereals). Plain muffins. Matzo. Melba toast. Zwieback.  °Vegetables °Potatoes (without the skin). Strained tomato and vegetable juices. Most well-cooked and canned  vegetables without seeds. Tender lettuce. °Fruits °Cooked or canned applesauce, apricots, cherries, fruit cocktail, grapefruit, peaches, pears, or plums. Fresh bananas, apples without skin, cherries, grapes, cantaloupe, grapefruit, peaches, oranges, or plums.  °Meat and Other Protein Products °Baked or boiled chicken. Eggs. Tofu. Fish. Seafood. Smooth peanut butter. Ground or well-cooked tender beef, ham, veal, lamb, pork, or poultry.  °Dairy °Plain yogurt, kefir, and unsweetened liquid yogurt. Lactose-free milk, buttermilk, or soy milk. Plain hard cheese. °Beverages °Sport drinks. Clear broths. Diluted fruit juices (except prune). Regular, caffeine-free sodas such as ginger ale. Water. Decaffeinated teas. Oral rehydration solutions. Sugar-free beverages not sweetened with sugar alcohols. °Other °Bouillon, broth, or soups made from recommended foods.  °The items listed above may not be a complete list of recommended foods or beverages. Contact your dietitian for more options. °WHAT FOODS ARE NOT RECOMMENDED? °Grains °Whole grain, whole wheat, bran, or rye breads, rolls, pastas, crackers, and cereals. Wild or brown rice. Cereals that contain more than 2 g of fiber per serving. Corn tortillas or taco shells. Cooked or dry oatmeal. Granola. Popcorn. °Vegetables °Raw vegetables. Cabbage, broccoli, Brussels sprouts, artichokes, baked beans, beet greens, corn, kale, legumes, peas, sweet potatoes, and yams. Potato skins. Cooked spinach and cabbage. °Fruits °Dried fruit, including raisins and dates. Raw fruits. Stewed or dried prunes. Fresh apples with skin, apricots, mangoes, pears, raspberries, and strawberries.  °Meat and Other Protein Products °Chunky peanut butter. Nuts and seeds. Beans and lentils. Bacon.  °Dairy °High-fat cheeses. Milk, chocolate milk, and beverages made with milk, such as milk shakes. Cream. Ice cream. °Sweets and Desserts °Sweet rolls, doughnuts, and sweet breads.   Pancakes and waffles. Fats and  Oils Butter. Cream sauces. Margarine. Salad oils. Plain salad dressings. Olives. Avocados.  Beverages Caffeinated beverages (such as coffee, tea, soda, or energy drinks). Alcoholic beverages. Fruit juices with pulp. Prune juice. Soft drinks sweetened with high-fructose corn syrup or sugar alcohols. Other Coconut. Hot sauce. Chili powder. Mayonnaise. Gravy. Cream-based or milk-based soups.  The items listed above may not be a complete list of foods and beverages to avoid. Contact your dietitian for more information. WHAT SHOULD I DO IF I BECOME DEHYDRATED? Diarrhea can sometimes lead to dehydration. Signs of dehydration include dark urine and dry mouth and skin. If you think you are dehydrated, you should rehydrate with an oral rehydration solution. These solutions can be purchased at pharmacies, retail stores, or online.  Drink -1 cup (120-240 mL) of oral rehydration solution each time you have an episode of diarrhea. If drinking this amount makes your diarrhea worse, try drinking smaller amounts more often. For example, drink 1-3 tsp (5-15 mL) every 5-10 minutes.  A general rule for staying hydrated is to drink 1-2 L of fluid per day. Talk to your health care provider about the specific amount you should be drinking each day. Drink enough fluids to keep your urine clear or pale yellow.   This information is not intended to replace advice given to you by your health care provider. Make sure you discuss any questions you have with your health care provider.   Document Released: 04/05/2003 Document Revised: 02/03/2014 Document Reviewed: 12/06/2012 Elsevier Interactive Patient Education 2016 [employer]  DASH Eating Plan DASH stands for "Dietary Approaches to Stop Hypertension." The DASH eating plan is a healthy eating plan that has been shown to reduce high blood pressure (hypertension). Additional health benefits may include reducing the risk of type 2 diabetes mellitus, heart disease, and  stroke. The DASH eating plan may also help with weight loss. WHAT DO I NEED TO KNOW ABOUT THE DASH EATING PLAN? For the DASH eating plan, you will follow these general guidelines:  Choose foods with a percent daily value for sodium of less than 5% (as listed on the food label).  Use salt-free seasonings or herbs instead of table salt or sea salt.  Check with your health care provider or pharmacist before using salt substitutes.  Eat lower-sodium products, often labeled as "lower sodium" or "no salt added."  Eat fresh foods.  Eat more vegetables, fruits, and low-fat dairy products.  Choose whole grains. Look for the word "whole" as the first word in the ingredient list.  Choose fish and skinless chicken or [place] more often than red meat. Limit fish, poultry, and meat to 6 oz (170 g) each day.  Limit sweets, desserts, sugars, and sugary drinks.  Choose heart-healthy fats.  Limit cheese to 1 oz (28 g) per day.  Eat more home-cooked food and less restaurant, buffet, and fast food.  Limit fried foods.  Cook foods using methods other than frying.  Limit canned vegetables. If you do use them, rinse them well to decrease the sodium.  When eating at a restaurant, ask that your food be prepared with less salt, or no salt if possible. WHAT FOODS CAN I EAT? Seek help from a dietitian for individual calorie needs. Grains Whole grain or whole wheat bread. Brown rice. Whole grain or whole wheat pasta. Quinoa, bulgur, and whole grain cereals. Low-sodium cereals. Corn or whole wheat flour tortillas. Whole grain cornbread. Whole grain crackers. Low-sodium crackers. Vegetables Fresh or frozen vegetables (  raw, steamed, roasted, or grilled). Low-sodium or reduced-sodium tomato and vegetable juices. Low-sodium or reduced-sodium tomato sauce and paste. Low-sodium or reduced-sodium canned vegetables.  Fruits All fresh, canned (in natural juice), or frozen fruits. Meat and Other Protein  Products Ground beef (85% or leaner), grass-fed beef, or beef trimmed of fat. Skinless chicken or [place]. Ground chicken or [place]. Pork trimmed of fat. All fish and seafood. Eggs. Dried beans, peas, or lentils. Unsalted nuts and seeds. Unsalted canned beans. Dairy Low-fat dairy products, such as skim or 1% milk, 2% or reduced-fat cheeses, low-fat ricotta or cottage cheese, or plain low-fat yogurt. Low-sodium or reduced-sodium cheeses. Fats and Oils Tub margarines without trans fats. Light or reduced-fat mayonnaise and salad dressings (reduced sodium). Avocado. Safflower, olive, or canola oils. Natural peanut or almond butter. Other Unsalted popcorn and pretzels. The items listed above may not be a complete list of recommended foods or beverages. Contact your dietitian for more options. WHAT FOODS ARE NOT RECOMMENDED? Grains White bread. White pasta. White rice. Refined cornbread. Bagels and croissants. Crackers that contain trans fat. Vegetables Creamed or fried vegetables. Vegetables in a cheese sauce. Regular canned vegetables. Regular canned tomato sauce and paste. Regular tomato and vegetable juices. Fruits Dried fruits. Canned fruit in light or heavy syrup. Fruit juice. Meat and Other Protein Products Fatty cuts of meat. Ribs, chicken wings, bacon, sausage, bologna, salami, chitterlings, fatback, hot dogs, bratwurst, and packaged luncheon meats. Salted nuts and seeds. Canned beans with salt. Dairy Whole or 2% milk, cream, half-and-half, and cream cheese. Whole-fat or sweetened yogurt. Full-fat cheeses or blue cheese. Nondairy creamers and whipped toppings. Processed cheese, cheese spreads, or cheese curds. Condiments Onion and garlic salt, seasoned salt, table salt, and sea salt. Canned and packaged gravies. Worcestershire sauce. Tartar sauce. Barbecue sauce. Teriyaki sauce. Soy sauce, including reduced sodium. Steak sauce. Fish sauce. Oyster sauce. Cocktail sauce. Horseradish. Ketchup and  mustard. Meat flavorings and tenderizers. Bouillon cubes. Hot sauce. Tabasco sauce. Marinades. Taco seasonings. Relishes. Fats and Oils Butter, stick margarine, lard, shortening, ghee, and bacon fat. Coconut, palm kernel, or palm oils. Regular salad dressings. Other Pickles and olives. Salted popcorn and pretzels. The items listed above may not be a complete list of foods and beverages to avoid. Contact your dietitian for more information. WHERE CAN I FIND MORE INFORMATION? National Heart, Lung, and Blood Institute: CableProm[employer].itwww.nhlbi.nih.gov/health/health-topics/topics/dash/   This information is not intended to replace advice given to you by your health care provider. Make sure you discuss any questions you have with your health care provider.   Document Released: 01/02/2011 Document Revised: 02/03/2014 Document Reviewed: 11/17/2012 Elsevier Interactive Patient Education 2016 [employer] How to Avoid Diabetes Problems You can do a lot to prevent or slow down diabetes problems. Following your diabetes plan and taking care of yourself can reduce your risk of serious or life-threatening complications. Below, you will find certain things you can do to prevent diabetes problems. MANAGE YOUR DIABETES Follow your health care provider's, nurse educator's, and dietitian's instructions for managing your diabetes. They will teach you the basics of diabetes care. They can help answer questions you may have. Learn about diabetes and make healthy choices regarding eating and physical activity. Monitor your blood glucose level regularly. Your health care provider will help you decide how often to check your blood glucose level depending on your treatment goals and how well you are meeting them.  DO NOT USE NICOTINE Nicotine and diabetes are a dangerous combination. Nicotine raises your risk for diabetes  problems. If you quit using nicotine, you will lower your risk for heart attack, stroke, nerve disease, and kidney  disease. Your cholesterol and your blood pressure levels may improve. Your blood circulation will also improve. Do not use any tobacco products, including cigarettes, chewing tobacco, or electronic cigarettes. If you need help quitting, ask your health care provider. KEEP YOUR BLOOD PRESSURE UNDER CONTROL Your health care provider will determine your individualized target blood pressure based on your age, your medicines, how long you have had diabetes, and any other medical conditions you have. Blood pressure consists of two numbers. Generally, the goal is to keep your top number (systolic pressure) at or below 130, and your bottom number (diastolic pressure) at or below 80. Your health care provider may recommend a lower target blood pressure reading, if appropriate. Meal planning, medicines, and exercise can help you reach your target blood pressure. Make sure your health care provider checks your blood pressure at every visit. KEEP YOUR CHOLESTEROL UNDER CONTROL Normal cholesterol levels will help prevent heart disease and stroke. These are the biggest health problems for people with diabetes. Keeping cholesterol levels under control can also help with blood flow. Have your cholesterol level checked at least once a year. Your health care provider may prescribe a medicine known as a statin. Statins lower your cholesterol. If you are not taking a statin, ask your health care provider if you should be. Meal planning, exercise, and medicines can help you reach your cholesterol targets.  SCHEDULE AND KEEP YOUR ANNUAL PHYSICAL EXAMS AND EYE EXAMS Your health care provider will tell you how often he or she wants to see you depending on your plan of treatment. It is important that you keep these appointments so that possible problems can be identified early and complications can be avoided or treated.  Every visit with your health care provider should include your weight, blood pressure, and an evaluation of your  blood glucose control.  Your hemoglobin A1c should be checked:  At least twice a year if you are at your goal.  Every 3 months if there are changes in treatment.  If you are not meeting your goals.  Your blood lipids should be checked yearly. You should also be checked yearly to see if you have protein in your urine (microalbumin).  Schedule a dilated eye exam within 5 years of your diagnosis if you have type 1 diabetes, and then yearly. Schedule a dilated eye exam at diagnosis if you have type 2 diabetes, and then yearly. All exams thereafter can be extended to every 2 to 3 years if one or more exams have been normal. KEEP YOUR VACCINES CURRENT It is recommended that you receive a flu (influenza) vaccine every year. It is also recommended that you receive a pneumonia (pneumococcal) vaccine. If you are 46 years of age or older and have never received a pneumonia vaccine, this vaccine may be given as a series of two separate shots. Ask your health care provider which additional vaccines may be recommended. TAKE CARE OF YOUR FEET  Diabetes may cause you to have a poor blood supply (circulation) to your legs and feet. Because of this, the skin may be thinner, break easier, and heal more slowly. You also may have nerve damage in your legs and feet, causing decreased feeling. You may not notice minor injuries to your feet that could lead to serious problems or infections. Taking care of your feet is very important. Visual foot exams are performed at  every routine medical visit. The exams check for cuts, injuries, or other problems with the feet. A comprehensive foot exam should be done yearly. This includes visual inspection as well as assessing foot pulses and testing for loss of sensation. You should also do the following:  Inspect your feet daily for cuts, calluses, blisters, ingrown toenails, and signs of infection, such as redness, swelling, or pus.  Wash and dry your feet thoroughly, especially  between the toes.  Avoid soaking your feet regularly in hot water baths.  Moisturize dry skin with lotion, avoiding areas between your toes.  Cut toenails straight across and file the edges.  Avoid shoes that do not fit well or have areas that irritate your skin.  Avoid going barefooted or wearing only socks. Your feet need protection. TAKE CARE OF YOUR TEETH People with poorly controlled diabetes are more likely to have gum (periodontal) disease. These infections make diabetes harder to control. Periodontal diseases, if left untreated, can lead to tooth loss. Brush your teeth twice a day, floss, and see your dentist for checkups and cleaning every 6 months, or 2 times a year. ASK YOUR HEALTH CARE PROVIDER ABOUT TAKING ASPIRIN Taking aspirin daily is recommended to help prevent cardiovascular disease in people with and without diabetes. Ask your health care provider if this would benefit you and what dose he or she would recommend. DRINK RESPONSIBLY Moderate amounts of alcohol (less than 1 drink per day for adult women and less than 2 drinks per day for adult men) have a minimal effect on blood glucose if ingested with food. It is important to eat food with alcohol to avoid hypoglycemia. People should avoid alcohol if they have a history of alcohol abuse or dependence, if they are pregnant, and if they have liver disease, pancreatitis, advanced neuropathy, or severe hypertriglyceridemia. LESSEN STRESS Living with diabetes can be stressful. When you are under stress, your blood glucose may be affected in two ways:  Stress hormones may cause your blood glucose to rise.  You may be distracted from taking good care of yourself. It is a good idea to be aware of your stress level and make changes that are necessary to help you better manage challenging situations. Support groups, planned relaxation, a hobby you enjoy, meditation, healthy relationships, and exercise all work to lower your stress  level. If your efforts do not seem to be helping, get help from your health care provider or a trained mental health professional.   This information is not intended to replace advice given to you by your health care provider. Make sure you discuss any questions you have with your health care provider.   Document Released: 10/01/2010 Document Revised: 02/03/2014 Document Reviewed: 03/09/2013 Elsevier Interactive Patient Education [employer]

## 2015-05-23 NOTE — ED Provider Notes (Signed)
CSN: [id number]     Arrival date & time 05/23/15  0911 History   First MD Initiated Contact with Patient 05/23/15 1107     Chief Complaint  Patient presents with  . Dizziness  . Anxiety     (Consider location/radiation/quality/duration/timing/severity/associated sxs/prior Treatment) HPI Comments: Patient is a 25 year old female history of hypertension and depression who presents today with fatigue and lightheadedness. Patient states she was [facility] for abdominal pain and diarrhea. At that time she was told her blood pressure was high and that it was a stroke level. The patient's blood pressure was treated with enalapril and did decrease before discharge. The patient was discharged with a refill of her medicines, that she has not been taking. [facility] and began feeling lightheaded and she noticed a difference in her speech, any type of slurring or stuttering. The patient was not able to completely explain it. The patient has also been short of breath since Thursday when she states she has had a sore throat and nasal congestion. She denies cough. She's been taking DayQuil and NyQuil. Patient states the illness improved and was better Monday but now it is back and worse than before. She states she has a metallic taste in her mouth. She also reports right foot paresthesias that began after her visit forms hospital today. Patient states she has been doing a lot of stress in her life and it is concerned she may have anxiety. Patient denies chest pain, abdominal pain, nausea, vomiting, dysuria. Patient has history of tonsillectomy and adenoidectomy.  Patient is a 25 y.o. female presenting with dizziness and anxiety. The history is provided by the patient.  Dizziness Associated symptoms: shortness of breath   Associated symptoms: no chest pain, no headaches, no nausea and no vomiting   Anxiety Associated symptoms include congestion and a sore throat.  Pertinent negatives include no abdominal pain, chest pain, chills, coughing, fever, headaches, nausea, rash or vomiting.    Past Medical History  Diagnosis Date  . Hypertension   . Depression   . Diabetes mellitus without complication [facility](HCC)    Past Surgical History  Procedure Laterality Date  . Tonsillectomy    . Adenoidectomy     Family History  Problem Relation Age of Onset  . Hypertension Mother   . Diabetes Mother    Social History  Substance Use Topics  . Smoking status: Former Games developermoker  . Smokeless tobacco: Never Used  . Alcohol Use: No   OB History    Gravida Para Term Preterm AB TAB SAB Ectopic Multiple Living   0              Review of Systems  Constitutional: Negative for fever and chills.  HENT: Positive for congestion and sore throat. Negative for facial swelling.   Respiratory: Positive for shortness of breath. Negative for cough.   Cardiovascular: Negative for chest pain.  Gastrointestinal: Negative for nausea, vomiting and abdominal pain.  Genitourinary: Negative for dysuria.  Musculoskeletal: Negative for back pain.  Skin: Negative for rash and wound.  Neurological: Positive for light-headedness. Negative for dizziness and headaches.  Psychiatric/Behavioral: The patient is not nervous/anxious.       Allergies  Review of patient's allergies indicates no known allergies.  Home Medications   Prior to Admission medications   Medication Sig Start Date End Date Taking? Authorizing Provider  albuterol (PROVENTIL HFA;VENTOLIN HFA) 108 (90 Base) MCG/ACT inhaler Inhale 1-2 puffs into the lungs every 6 (six)  hours as needed for wheezing or shortness of breath.   Yes Historical Provider, MD  DM-Doxylamine-Acetaminophen (VICKS NYQUIL COLD & FLU) 15-6.25-325 MG CAPS Take 2 capsules by mouth at bedtime as needed (for cold/sleep).   Yes Historical Provider, MD  DM-Phenylephrine-Acetaminophen (VICKS DAYQUIL COLD & FLU) 10-5-325 MG CAPS Take 2 capsules by mouth daily  as needed (for cold).   Yes Historical Provider, MD  ibuprofen (ADVIL,MOTRIN) 200 MG tablet Take 400 mg by mouth every 6 (six) hours as needed for fever, headache, mild pain, moderate pain or cramping.   Yes Historical Provider, MD  megestrol (MEGACE) 40 MG tablet Take BID until bleeding stops and then continue daily. Patient taking differently: Take 40 mg by mouth daily as needed (for heavy bleeding).  12/09/13  Yes Jerene Bears, MD  enalapril (VASOTEC) 5 MG tablet Take 1 tablet (5 mg total) by mouth daily. 05/23/15   Marny Lowenstein, PA-C  metFORMIN (GLUCOPHAGE) 500 MG tablet Take 1 tablet (500 mg total) by mouth daily with breakfast. 05/23/15   Marny Lowenstein, PA-C  metroNIDAZOLE (FLAGYL) 500 MG tablet Take 1 tablet (500 mg total) by mouth 2 (two) times daily. Patient not taking: Reported on 05/23/2015 02/14/15   Roxy Horseman, PA-C  oxyCODONE-acetaminophen (PERCOCET/ROXICET) 5-325 MG tablet Take 1 tablet by mouth every 6 (six) hours as needed for severe pain. 05/23/15   Marny Lowenstein, PA-C   BP 130/68 mmHg  Pulse 98  Temp(Src) 98.4 F (36.9 C) (Oral)  Resp 18  Ht  (1.702 m)  Wt 223.17 kg  BMI 77.04 kg/m2  SpO2 98%  LMP 03/11/2015 Physical Exam  Constitutional: She appears well-developed and well-nourished. No distress.  Morbidly obese  HENT:  Head: Normocephalic and atraumatic.  Mouth/Throat: Mucous membranes are normal. Posterior oropharyngeal erythema present. No oropharyngeal exudate or posterior oropharyngeal edema.  Eyes: Conjunctivae and EOM are normal. Pupils are equal, round, and reactive to light. Right eye exhibits no discharge. Left eye exhibits no discharge. No scleral icterus.  Neck: Normal range of motion. Neck supple. No thyromegaly present.  Cardiovascular: Normal rate, regular rhythm, normal heart sounds and intact distal pulses.  Exam reveals no gallop and no friction rub.   No murmur heard. Pulmonary/Chest: Effort normal and breath sounds normal. No stridor. No  respiratory distress. She has no wheezes.  Difficult to auscultate due to patient's morbid obesity, possible rales heard in the left middle lung  Abdominal: Soft. Bowel sounds are normal. She exhibits no distension. There is no tenderness. There is no rebound and no guarding.  Musculoskeletal: She exhibits no edema.  Lymphadenopathy:    She has no cervical adenopathy.  Neurological: She is alert. Coordination normal.  CN 3-12 intact, normal sensation throughout with the exception of paresthesia of the right foot; 5/5 strength throughout; unaware of baseline, patient does seem to have mild speech stuttering  Skin: Skin is warm and dry. No rash noted. She is not diaphoretic. No pallor.  Psychiatric: She has a normal mood and affect.  Nursing note and vitals reviewed.   ED Course  Procedures (including critical care time) Labs Review Labs Reviewed - No data to display  Imaging Review Dg Chest 2 View  05/23/2015  CLINICAL DATA:  New onset of shortness of breath. Mid upper abdominal pain this morning. EXAM: CHEST  2 VIEW COMPARISON:  02/14/2013 FINDINGS: The heart size and mediastinal contours are within normal limits. Both lungs are clear. The visualized skeletal structures are unremarkable. IMPRESSION: No active cardiopulmonary  disease. Electronically Signed   By: Richarda Overlie M.D.   On: 05/23/2015 12:03   I have personally reviewed and evaluated these images and lab results as part of my medical decision-making.   EKG Interpretation   Date/Time:    Ventricular Rate:    PR Interval:    QRS Duration:   QT Interval:    QTC Calculation:   R Axis:     Text Interpretation:        MDM   Patient noted to be hypertensive in the emergency departmentBut improved throughout visit.  No signs of hypertensive urgency. Patient's lightheadedness and speech change resolved throughout ED stay. Lightheadedness likely due to and enalapril administration at [facility]. Labs were done at [facility], and patient noted to have high blood glucose, 191. Chest x-ray negative. EKG shows NSR. Discussed with patient to follow-up with a primary care physician if her shortness of breath does not resolve with her upper respiratory infection. Discussed with patient the need for close follow-up and management by a primary care physician and the importance of taking her blood pressure medication, weight loss, management of diabetes. Patient also evaluated by Dr. Donnetta Hutching who is in agreement with plan. Patient vitals stable and discharged in satisfactory condition. Patient blood pressure 130/68 at discharge, with resolved lightheadedness and speech change. Patient understands and is in agreement with plan.   Final diagnoses:  Essential hypertension  Lightheadedness       Emi Holes, PA-C 05/23/15 1440  Donnetta Hutching, MD 05/24/15 [phone]

## 2015-05-23 NOTE — MAU Provider Note (Signed)
History     CSN: [id number]  Arrival date and time: 05/23/15 0600   First Provider Initiated Contact with Patient 05/23/15 [phone]      Chief Complaint  Patient presents with  . Abdominal Pain   HPI Laura Benson is a 25 y.o. G0P0 who presents to MAU today with complaint of abdominal pain x 2 weeks. The patient has a history of HTN and DM and has discontinued her medications without consulting a MD. She states that she has not been checking her glucose at home since stopping the Metformin. She endorses weakness, fatigue, polydipsia and polyuria. She was taking Enalavil for HTN. She denies headache, vision changes or chest pain today. She states abdominal pain rated at 7/10 now. She was taking Aleve without relief. She denies vaginal bleeding, abnormal discharge or fever. She has had occasional nausea without vomiting. She also endorses watery diarrhea since yesterday. She has BCBS but does not have PCP. She states that she can't afford the copays.   OB History    Gravida Para Term Preterm AB TAB SAB Ectopic Multiple Living   0               Past Medical History  Diagnosis Date  . Hypertension   . Depression   . Diabetes mellitus without complication [facility](HCC)     Past Surgical History  Procedure Laterality Date  . Tonsillectomy    . Adenoidectomy      Family History  Problem Relation Age of Onset  . Hypertension Mother   . Diabetes Mother     Social History  Substance Use Topics  . Smoking status: Former Games developermoker  . Smokeless tobacco: Never Used  . Alcohol Use: No    Allergies: No Known Allergies  Prescriptions prior to admission  Medication Sig Dispense Refill Last Dose  . enalapril (VASOTEC) 5 MG tablet Take 1 tablet (5 mg total) by mouth daily. 60 tablet 1 Over a month ago at Unknown time  . guaiFENesin-dextromethorphan (ROBITUSSIN DM) 100-10 MG/5ML syrup Take 5 mLs by mouth every 4 (four) hours as needed for cough.   Past Month at Unknown time  . megestrol  (MEGACE) 40 MG tablet Take BID until bleeding stops and then continue daily. (Patient taking differently: 40 mg daily. ) 60 tablet 3   . metFORMIN (GLUCOPHAGE) 500 MG tablet Take 1 tablet (500 mg total) by mouth daily with breakfast. 90 tablet 0 Over a month ago  . metroNIDAZOLE (FLAGYL) 500 MG tablet Take 1 tablet (500 mg total) by mouth 2 (two) times daily. 14 tablet 0     Review of Systems  Constitutional: Negative for fever and malaise/fatigue.  Gastrointestinal: Positive for nausea, abdominal pain and diarrhea. Negative for vomiting and constipation.  Genitourinary: Negative for dysuria, urgency and frequency.       Neg - vaginal bleeding, discharge   Physical Exam   Blood pressure 153/80, pulse 89, temperature 99 F (37.2 C), temperature source Oral, resp. rate 18, height 5\' 7"  (1.702 m), weight 492 lb 8 oz (223.397 kg), last menstrual period 03/11/2015, SpO2 97 %.  Physical Exam  Nursing note and vitals reviewed. Constitutional: She is oriented to person, place, and time. She appears well-developed and well-nourished. No distress.  HENT:  Head: Normocephalic and atraumatic.  Cardiovascular: Normal rate.   Respiratory: Effort normal.  GI: Soft. Bowel sounds are normal. She exhibits no distension and no mass. There is tenderness (diffuse mild abdominal tenderness to palpation). There is no rebound and no  guarding.  Genitourinary: There is no rash or tenderness on the right labia. There is no rash or tenderness on the left labia. No bleeding in the vagina. Vaginal discharge (small amount of thin, white discharge noted) found.  Exam limited by patient body habitus  Neurological: She is alert and oriented to person, place, and time.  Skin: Skin is warm and dry. No erythema.  Psychiatric: She has a normal mood and affect.    Results for orders placed or performed during the hospital encounter of 05/23/15 (from the past 24 hour(s))  Urinalysis, Routine w reflex microscopic (not at  [facility])     Status: None   Collection Time: 05/23/15  6:17 AM  Result Value Ref Range   Color, Urine YELLOW YELLOW   APPearance CLEAR CLEAR   Specific Gravity, Urine 1.020 1.005 - 1.030   pH 6.0 5.0 - 8.0   Glucose, UA NEGATIVE NEGATIVE mg/dL   Hgb urine dipstick NEGATIVE NEGATIVE   Bilirubin Urine NEGATIVE NEGATIVE   Ketones, ur NEGATIVE NEGATIVE mg/dL   Protein, ur NEGATIVE NEGATIVE mg/dL   Nitrite NEGATIVE NEGATIVE   Leukocytes, UA NEGATIVE NEGATIVE  Pregnancy, urine POC     Status: None   Collection Time: 05/23/15  6:21 AM  Result Value Ref Range   Preg Test, Ur NEGATIVE NEGATIVE  CBC with Differential/Platelet     Status: Abnormal (Preliminary result)   Collection Time: 05/23/15  7:02 AM  Result Value Ref Range   WBC 5.7 4.0 - 10.5 K/uL   RBC 5.47 (H) 3.87 - 5.11 MIL/uL   Hemoglobin 13.1 12.0 - 15.0 g/dL   HCT 45.4 09.8 - 11.9 %   MCV 74.2 (L) 78.0 - 100.0 fL   MCH 23.9 (L) 26.0 - 34.0 pg   MCHC 32.3 30.0 - 36.0 g/dL   RDW 14.7 82.9 - 56.2 %   Platelets 270 150 - 400 K/uL   Other PENDING %   Neutrophils Relative % 51 %   Neutro Abs 3.0 1.7 - 7.7 K/uL   Lymphocytes Relative 39 %   Lymphs Abs 2.3 0.7 - 4.0 K/uL   Monocytes Relative 8 %   Monocytes Absolute 0.5 0.1 - 1.0 K/uL   Eosinophils Relative 2 %   Eosinophils Absolute 0.1 0.0 - 0.7 K/uL   Basophils Relative 0 %   Basophils Absolute 0.0 0.0 - 0.1 K/uL  Comprehensive metabolic panel     Status: Abnormal   Collection Time: 05/23/15  7:02 AM  Result Value Ref Range   Sodium 138 135 - 145 mmol/L   Potassium 3.8 3.5 - 5.1 mmol/L   Chloride 105 101 - 111 mmol/L   CO2 27 22 - 32 mmol/L   Glucose, Bld 191 (H) 65 - 99 mg/dL   BUN 9 6 - 20 mg/dL   Creatinine, Ser 1.30 0.44 - 1.00 mg/dL   Calcium 9.0 8.9 - 86.5 mg/dL   Total Protein 7.5 6.5 - 8.1 g/dL   Albumin 3.9 3.5 - 5.0 g/dL   AST 16 15 - 41 U/L   ALT 18 14 - 54 U/L   Alkaline Phosphatase 72 38 - 126 U/L   Total Bilirubin 0.8 0.3 - 1.2 mg/dL   GFR calc non Af  Amer >60 >60 mL/min   GFR calc Af Amer >60 >60 mL/min   Anion gap 6 5 - 15  Wet prep, genital     Status: Abnormal   Collection Time: 05/23/15  7:15 AM  Result Value Ref Range  Yeast Wet Prep HPF POC NONE SEEN NONE SEEN   Trich, Wet Prep NONE SEEN NONE SEEN   Clue Cells Wet Prep HPF POC NONE SEEN NONE SEEN   WBC, Wet Prep HPF POC FEW (A) NONE SEEN   Sperm NONE SEEN   Glucose, capillary     Status: Abnormal   Collection Time: 05/23/15  7:27 AM  Result Value Ref Range   Glucose-Capillary 155 (H) 65 - 99 mg/dL    MAU Course  Procedures None  MDM UPT - negative UA, wet prep, GC/[place], CBC, HIV and RPR today  CBG ordered 5 mg Vasotec given for BP, 1 mg Dilaudid given for pain.  Blood pressure improved significantly with Vasotec Pain is improving after Dilaudid as well.  Patient is stable for discharge at this time.  Assessment and Plan  A: Abdominal pain, most likely secondary to GI symptoms CHTN, uncontrolled DM II   P:  Discharge home Patient advised to restart Vasotec and Metformin Patient advised to start checking CBG at home regularly Warning signs for worsening condition discussed Diet for diarrhea included on AVS, patient advised to try Imodium OTC PRN for diarrhea Patient advised to follow-up with PCP of choice for continued care of chronic medical problems Patient may return to MAU as needed or if her condition were to change or worsen, however since these issues are not GYN and patient is not pregnant, worsening condition would be best evaluated at [facility], [facility] or Urgent Care  Marny Lowenstein, PA-C  05/23/2015, 7:56 AM

## 2015-05-23 NOTE — Discharge Instructions (Signed)
Your lightheadedness today was most likely caused by your drop in blood pressure from the treatment you received [facility]. Begin taking your en[facility]april that was [facility] to control your blood pressure. Please follow-up with a primary care provider that can be found by calling the number listed on your discharge paperwork to establish care and further evaluation of your high blood pressure and diabetes. Please return to emergency department if he develop any new or worsening symptoms, such as fever, chest pain, changes in mental status, or your shortness of breath does not improve when your upper respiratory illness has resolved. You may take ibuprofen or Tylenol for your throat pain, or use over-the-counter throat spray.   Hypertension Hypertension, commonly called high blood pressure, is when the force of blood pumping through your arteries is too strong. Your arteries are the blood vessels that carry blood from your heart throughout your body. A blood pressure reading consists of a higher number over a lower number, such as 110/72. The higher number (systolic) is the pressure inside your arteries when your heart pumps. The lower number (diastolic) is the pressure inside your arteries when your heart relaxes. Ideally you want your blood pressure below 120/80. Hypertension forces your heart to work harder to pump blood. Your arteries may become narrow or stiff. Having untreated or uncontrolled hypertension can cause heart attack, stroke, kidney disease, and other problems. RISK FACTORS Some risk factors for high blood pressure are controllable. Others are not.  Risk factors you cannot control include:   Race. You may be at higher risk if you are African American.  Age. Risk increases with age.  Gender. Men are at higher risk than women before age 26 years. After age 26, women are at higher risk than men. Risk factors you can control include:  Not getting enough  exercise or physical activity.  Being overweight.  Getting too much fat, sugar, calories, or salt in your diet.  Drinking too much alcohol. SIGNS AND SYMPTOMS Hypertension does not usually cause signs or symptoms. Extremely high blood pressure (hypertensive crisis) may cause headache, anxiety, shortness of breath, and nosebleed. DIAGNOSIS To check if you have hypertension, your health care provider will measure your blood pressure while you are seated, with your arm held at the level of your heart. It should be measured at least twice using the same arm. Certain conditions can cause a difference in blood pressure between your right and left arms. A blood pressure reading that is higher than normal on one occasion does not mean that you need treatment. If it is not clear whether you have high blood pressure, you may be asked to return on a different day to have your blood pressure checked again. Or, you may be asked to monitor your blood pressure at home for 1 or more weeks. TREATMENT Treating high blood pressure includes making lifestyle changes and possibly taking medicine. Living a healthy lifestyle can help lower high blood pressure. You may need to change some of your habits. Lifestyle changes may include:  Following the DASH diet. This diet is high in fruits, vegetables, and whole grains. It is low in salt, red meat, and added sugars.  Keep your sodium intake below 2,300 mg per day.  Getting at least 30-45 minutes of aerobic exercise at least 4 times per week.  Losing weight if necessary.  Not smoking.  Limiting alcoholic beverages.  Learning ways to reduce stress. Your health care provider may prescribe medicine if lifestyle  changes are not enough to get your blood pressure under control, and if one of the following is true:  You are 38-6 years of age and your systolic blood pressure is above 140.  You are 77 years of age or older, and your systolic blood pressure is above  150.  Your diastolic blood pressure is above 90.  You have diabetes, and your systolic blood pressure is over 140 or your diastolic blood pressure is over 90.  You have kidney disease and your blood pressure is above 140/90.  You have heart disease and your blood pressure is above 140/90. Your personal target blood pressure may vary depending on your medical conditions, your age, and other factors. HOME CARE INSTRUCTIONS  Have your blood pressure rechecked as directed by your health care provider.   Take medicines only as directed by your health care provider. Follow the directions carefully. Blood pressure medicines must be taken as prescribed. The medicine does not work as well when you skip doses. Skipping doses also puts you at risk for problems.  Do not smoke.   Monitor your blood pressure at home as directed by your health care provider. SEEK MEDICAL CARE IF:   You think you are having a reaction to medicines taken.  You have recurrent headaches or feel dizzy.  You have swelling in your ankles.  You have trouble with your vision. SEEK IMMEDIATE MEDICAL CARE IF:  You develop a severe headache or confusion.  You have unusual weakness, numbness, or feel faint.  You have severe chest or abdominal pain.  You vomit repeatedly.  You have trouble breathing. MAKE SURE YOU:   Understand these instructions.  Will watch your condition.  Will get help right away if you are not doing well or get worse.   This information is not intended to replace advice given to you by your health care provider. Make sure you discuss any questions you have with your health care provider.   Document Released: 01/13/2005 Document Revised: 05/30/2014 Document Reviewed: 11/05/2012 Elsevier Interactive Patient Education [employer]

## 2015-05-23 NOTE — ED Notes (Addendum)
Patient states she ws having abdominal pain and [facility]. Patient states she began having dizziness and weakness, anxiety, and abdominal pain. Patient tearful in triage.

## 2015-05-24 LAB — GC/CHLAMYDIA PROBE AMP (~~LOC~~) NOT AT ARMC
Chlamydia: NEGATIVE
Neisseria Gonorrhea: NEGATIVE

## 2015-09-01 ENCOUNTER — Inpatient Hospital Stay (HOSPITAL_COMMUNITY)
Admission: AD | Admit: 2015-09-01 | Discharge: 2015-09-02 | Disposition: A | Discharge status: Home / Self Care | Payer: BLUE CROSS/BLUE SHIELD | Source: Ambulatory Visit | Attending: Family Medicine | Admitting: Family Medicine

## 2015-09-01 ENCOUNTER — Encounter (HOSPITAL_COMMUNITY): Payer: Self-pay | Admitting: *Deleted

## 2015-09-01 DIAGNOSIS — E119 Type 2 diabetes mellitus without complications: Secondary | ICD-10-CM | POA: Insufficient documentation

## 2015-09-01 DIAGNOSIS — N946 Dysmenorrhea, unspecified: Secondary | ICD-10-CM | POA: Diagnosis not present

## 2015-09-01 DIAGNOSIS — N938 Other specified abnormal uterine and vaginal bleeding: Secondary | ICD-10-CM | POA: Diagnosis not present

## 2015-09-01 DIAGNOSIS — N939 Abnormal uterine and vaginal bleeding, unspecified: Secondary | ICD-10-CM

## 2015-09-01 DIAGNOSIS — Z87891 Personal history of nicotine dependence: Secondary | ICD-10-CM | POA: Insufficient documentation

## 2015-09-01 DIAGNOSIS — Z7984 Long term (current) use of oral hypoglycemic drugs: Secondary | ICD-10-CM | POA: Insufficient documentation

## 2015-09-01 LAB — URINE MICROSCOPIC-ADD ON

## 2015-09-01 LAB — CBC
HCT: 41.1 % (ref 36.0–46.0)
Hemoglobin: 13.4 g/dL (ref 12.0–15.0)
MCH: 23.8 pg — AB (ref 26.0–34.0)
MCHC: 32.6 g/dL (ref 30.0–36.0)
MCV: 73 fL — AB (ref 78.0–100.0)
PLATELETS: 291 10*3/uL (ref 150–400)
RBC: 5.63 MIL/uL — AB (ref 3.87–5.11)
RDW: 14.5 % (ref 11.5–15.5)
WBC: 6.9 10*3/uL (ref 4.0–10.5)

## 2015-09-01 LAB — URINALYSIS, ROUTINE W REFLEX MICROSCOPIC
GLUCOSE, UA: 250 mg/dL — AB
Ketones, ur: 15 mg/dL — AB
Leukocytes, UA: NEGATIVE
NITRITE: NEGATIVE
PH: 5.5 (ref 5.0–8.0)
Protein, ur: 30 mg/dL — AB

## 2015-09-01 LAB — POCT PREGNANCY, URINE: Preg Test, Ur: NEGATIVE

## 2015-09-01 NOTE — MAU Note (Signed)
Vag bleeding for 5 days. Severe lower abd cramping. Blood clots size of fifty cent piece

## 2015-09-01 NOTE — MAU Note (Signed)
Patient endorses not having taken medications today for her blood pressure or diabetes. NP made aware.

## 2015-09-01 NOTE — MAU Provider Note (Signed)
History     CSN: [id number]  Arrival date and time: 09/01/15 2229   None     Chief Complaint  Patient presents with  . Vaginal Bleeding   Gweneth Fredlund is a 25 y.o. G0P0 female who presents for heavy vaginal bleeding. PMH significant for chronic hypertension, T2DM, & AUB. Pt reports irregular periods that are heavy. Current bleeding started 5 days ago. Also reports menstrual like abdominal cramps that are worse than normal. Rates pain 7/10. Has not treated pain. No aggravating or alleviating factors. Currently not going to PCP or gyn for treatment of any of her chronic illnesses.    Vaginal Bleeding  The patient's primary symptoms include pelvic pain and vaginal bleeding. The patient's pertinent negatives include no genital itching, genital lesions or genital odor. This is a recurrent problem. Episode onset: x 5 days. The problem occurs constantly. The problem has been unchanged. The pain is moderate. The problem affects both sides. She is not pregnant. Associated symptoms include abdominal pain. Pertinent negatives include no anorexia, chills, constipation, diarrhea, dysuria, fever, nausea or vomiting. The vaginal bleeding is heavier than menses. She has not been passing clots. She has not been passing tissue. Nothing aggravates the symptoms. She has tried nothing for the symptoms. She is sexually active. No, her partner does not have an STD. She uses nothing for contraception. Her menstrual history has been irregular (hx of AUB). Her past medical history is significant for menorrhagia and metrorrhagia. There is no history of PID or an STD.    OB History    Gravida Para Term Preterm AB Living   0             SAB TAB Ectopic Multiple Live Births                  Past Medical History:  Diagnosis Date  . Depression   . Diabetes mellitus without complication (HCC)   . Hypertension     Past Surgical History:  Procedure Laterality Date  . ADENOIDECTOMY    . TONSILLECTOMY       Family History  Problem Relation Age of Onset  . Hypertension Mother   . Diabetes Mother     Social History  Substance Use Topics  . Smoking status: Former Games developer  . Smokeless tobacco: Never Used  . Alcohol use No    Allergies: No Known Allergies  Prescriptions Prior to Admission  Medication Sig Dispense Refill Last Dose  . albuterol (PROVENTIL HFA;VENTOLIN HFA) 108 (90 Base) MCG/ACT inhaler Inhale 1-2 puffs into the lungs every 6 (six) hours as needed for wheezing or shortness of breath.   unknown  . DM-Doxylamine-Acetaminophen (VICKS NYQUIL COLD & FLU) 15-6.25-325 MG CAPS Take 2 capsules by mouth at bedtime as needed (for cold/sleep).   Past Week at Unknown time  . DM-Phenylephrine-Acetaminophen (VICKS DAYQUIL COLD & FLU) 10-5-325 MG CAPS Take 2 capsules by mouth daily as needed (for cold).   05/21/2015  . enalapril (VASOTEC) 5 MG tablet Take 1 tablet (5 mg total) by mouth daily. 60 tablet 0 hasn't started back yet  . ibuprofen (ADVIL,MOTRIN) 200 MG tablet Take 400 mg by mouth every 6 (six) hours as needed for fever, headache, mild pain, moderate pain or cramping.   05/22/2015 at Unknown time  . megestrol (MEGACE) 40 MG tablet Take BID until bleeding stops and then continue daily. (Patient taking differently: Take 40 mg by mouth daily as needed (for heavy bleeding). ) 60 tablet 3 01/2015  . metFORMIN (  GLUCOPHAGE) 500 MG tablet Take 1 tablet (500 mg total) by mouth daily with breakfast. 90 tablet 0 hasn't start back yet  . metroNIDAZOLE (FLAGYL) 500 MG tablet Take 1 tablet (500 mg total) by mouth 2 (two) times daily. (Patient not taking: Reported on 05/23/2015) 14 tablet 0 Not Taking at Unknown time  . oxyCODONE-acetaminophen (PERCOCET/ROXICET) 5-325 MG tablet Take 1 tablet by mouth every 6 (six) hours as needed for severe pain. 6 tablet 0 hasn't picked up yet    Review of Systems  Constitutional: Negative.  Negative for chills and fever.  HENT: Negative.   Respiratory: Negative  for shortness of breath.   Cardiovascular: Negative for chest pain.  Gastrointestinal: Positive for abdominal pain. Negative for anorexia, constipation, diarrhea, nausea and vomiting.  Genitourinary: Positive for menorrhagia, pelvic pain and vaginal bleeding. Negative for dysuria.       + vaginal bleeding. Has used 5 large pads today.    Physical Exam   Blood pressure 152/95, pulse 92, temperature 98.3 F (36.8 C), resp. rate 18, height  (1.702 m), weight (!) 484 lb (219.5 kg), last menstrual period 06/17/2015.  Physical Exam  Nursing note and vitals reviewed. Constitutional: She is oriented to person, place, and time. She appears well-developed and well-nourished. She is cooperative. No distress.  Morbidly obese  HENT:  Head: Normocephalic and atraumatic.  Eyes: Conjunctivae are normal. Right eye exhibits no discharge. Left eye exhibits no discharge. No scleral icterus.  Neck: Normal range of motion.  Cardiovascular: Normal rate, regular rhythm and normal heart sounds.   No murmur heard. Respiratory: Effort normal and breath sounds normal. No respiratory distress. She has no wheezes.  Genitourinary:  Genitourinary Comments: Large pad in place for 2+ hours half filled with dark red blood.   Neurological: She is alert and oriented to person, place, and time.  Skin: Skin is warm and dry. She is not diaphoretic.  Psychiatric: She has a normal mood and affect. Her behavior is normal. Judgment and thought content normal.    MAU Course  Procedures Results for orders placed or performed during the hospital encounter of 09/01/15 (from the past 72 hour(s))  Urinalysis, Routine w reflex microscopic (not at [facility])     Status: Abnormal   Collection Time: 09/01/15 10:51 PM  Result Value Ref Range   Color, Urine BROWN (A) YELLOW    Comment: BIOCHEMICALS MAY BE AFFECTED BY COLOR   APPearance TURBID (A) CLEAR   Specific Gravity, Urine >1.030 (H) 1.005 - 1.030   pH 5.5 5.0 - 8.0   Glucose,  UA 250 (A) NEGATIVE mg/dL   Hgb urine dipstick LARGE (A) NEGATIVE   Bilirubin Urine SMALL (A) NEGATIVE   Ketones, ur 15 (A) NEGATIVE mg/dL   Protein, ur 30 (A) NEGATIVE mg/dL   Nitrite NEGATIVE NEGATIVE   Leukocytes, UA NEGATIVE NEGATIVE  Urine microscopic-add on     Status: Abnormal   Collection Time: 09/01/15 10:51 PM  Result Value Ref Range   Squamous Epithelial / LPF 0-5 (A) NONE SEEN   WBC, UA 0-5 0 - 5 WBC/hpf   RBC / HPF TOO NUMEROUS TO COUNT 0 - 5 RBC/hpf   Bacteria, UA FEW (A) NONE SEEN   Urine-Other MUCOUS PRESENT   Pregnancy, urine POC     Status: None   Collection Time: 09/01/15 11:10 PM  Result Value Ref Range   Preg Test, Ur NEGATIVE NEGATIVE    Comment:        THE SENSITIVITY OF THIS METHODOLOGY  IS >24 mIU/mL   CBC     Status: Abnormal   Collection Time: 09/01/15 11:13 PM  Result Value Ref Range   WBC 6.9 4.0 - 10.5 K/uL   RBC 5.63 (H) 3.87 - 5.11 MIL/uL   Hemoglobin 13.4 12.0 - 15.0 g/dL   HCT 89.2 11.9 - 41.7 %   MCV 73.0 (L) 78.0 - 100.0 fL   MCH 23.8 (L) 26.0 - 34.0 pg   MCHC 32.6 30.0 - 36.0 g/dL   RDW 40.8 14.4 - 81.8 %   Platelets 291 150 - 400 K/uL   MDM UPT negative BP initially elevated; pt denies chest pain or SOB. Known chronic HTN not on meds, repeat BP lower.  CBC -- hemoglobin WNL Pt declines pelvic exam or pain medication  Discussed importance of regular visits with PCP to manage her HTN & DM.  Assessment and Plan  A: 1. Abnormal uterine bleeding (AUB)   2. Dysmenorrhea     P: Discharge home Ob/gyn provider list given -- make appointment to manage AUB Start routine care with previous PCP or find new PCP & establish care ASAP Discussed reasons to return to MAU vs ED   Judeth Horn 09/01/2015, 11:39 PM

## 2015-09-02 DIAGNOSIS — N939 Abnormal uterine and vaginal bleeding, unspecified: Secondary | ICD-10-CM

## 2015-09-02 NOTE — Discharge Instructions (Signed)
Abnormal Uterine Bleeding °Abnormal uterine bleeding can affect women at various stages in life, including teenagers, women in their reproductive years, pregnant women, and women who have reached menopause. Several kinds of uterine bleeding are considered abnormal, including: °· Bleeding or spotting between periods.   °· Bleeding after sexual intercourse.   °· Bleeding that is heavier or more than normal.   °· Periods that last longer than usual. °· Bleeding after menopause.   °Many cases of abnormal uterine bleeding are minor and simple to treat, while others are more serious. Any type of abnormal bleeding should be evaluated by your health care provider. Treatment will depend on the cause of the bleeding. °HOME CARE INSTRUCTIONS °Monitor your condition for any changes. The following actions may help to alleviate any discomfort you are experiencing: °· Avoid the use of tampons and douches as directed by your health care provider. °· Change your pads frequently. °You should get regular pelvic exams and Pap tests. Keep all follow-up appointments for diagnostic tests as directed by your health care provider.  °SEEK MEDICAL CARE IF:  °· Your bleeding lasts more than 1 week.   °· You feel dizzy at times.   °SEEK IMMEDIATE MEDICAL CARE IF:  °· You pass out.   °· You are changing pads every 15 to 30 minutes.   °· You have abdominal pain. °· You have a fever.   °· You become sweaty or weak.   °· You are passing large blood clots from the vagina.   °· You start to feel nauseous and vomit. °MAKE SURE YOU:  °· Understand these instructions. °· Will watch your condition. °· Will get help right away if you are not doing well or get worse. °  °This information is not intended to replace advice given to you by your health care provider. Make sure you discuss any questions you have with your health care provider. °  °Document Released: 01/13/2005 Document Revised: 01/18/2013 Document Reviewed: 08/12/2012 °Elsevier Interactive  Patient Education ©2016 [employer] ° °Dysmenorrhea °Menstrual cramps (dysmenorrhea) are caused by the muscles of the uterus tightening (contracting) during a menstrual period. For some women, this discomfort is merely bothersome. For others, dysmenorrhea can be severe enough to interfere with everyday activities for a few days each month. °Primary dysmenorrhea is menstrual cramps that last a couple of days when you start having menstrual periods or soon after. This often begins after a teenager starts having her period. As a woman gets older or has a baby, the cramps will usually lessen or disappear. Secondary dysmenorrhea begins later in life, lasts longer, and the pain may be stronger than primary dysmenorrhea. The pain may start before the period and last a few days after the period.  °CAUSES  °Dysmenorrhea is usually caused by an underlying problem, such as: °· The tissue lining the uterus grows outside of the uterus in other areas of the body (endometriosis). °· The endometrial tissue, which normally lines the uterus, is found in or grows into the muscular walls of the uterus (adenomyosis). °· The pelvic blood vessels are engorged with blood just before the menstrual period (pelvic congestive syndrome). °· Overgrowth of cells (polyps) in the lining of the uterus or cervix. °· Falling down of the uterus (prolapse) because of loose or stretched ligaments. °· Depression. °· Bladder problems, infection, or inflammation. °· Problems with the intestine, a tumor, or irritable bowel syndrome. °· Cancer of the female organs or bladder. °· A severely tipped uterus. °· A very tight opening or closed cervix. °· Noncancerous tumors of the uterus (  fibroids). °· Pelvic inflammatory disease (PID). °· Pelvic scarring (adhesions) from a previous surgery. °· Ovarian cyst. °· An intrauterine device (IUD) used for birth control. °RISK FACTORS °You may be at greater risk of dysmenorrhea if: °· You are younger than age 30. °· You  started puberty early. °· You have irregular or heavy bleeding. °· You have never given birth. °· You have a family history of this problem. °· You are a smoker. °SIGNS AND SYMPTOMS  °· Cramping or throbbing pain in your lower abdomen. °· Headaches. °· Lower back pain. °· Nausea or vomiting. °· Diarrhea. °· Sweating or dizziness. °· Loose stools. °DIAGNOSIS  °A diagnosis is based on your history, symptoms, physical exam, diagnostic tests, or procedures. Diagnostic tests or procedures may include: °· Blood tests. °· Ultrasonography. °· An examination of the lining of the uterus (dilation and curettage, D&C). °· An examination inside your abdomen or pelvis with a scope (laparoscopy). °· X-rays. °· CT scan. °· MRI. °· An examination inside the bladder with a scope (cystoscopy). °· An examination inside the intestine or stomach with a scope (colonoscopy, gastroscopy). °TREATMENT  °Treatment depends on the cause of the dysmenorrhea. Treatment may include: °· Pain medicine prescribed by your health care provider. °· Birth control pills or an IUD with progesterone hormone in it. °· Hormone replacement therapy. °· Nonsteroidal anti-inflammatory drugs (NSAIDs). These may help stop the production of prostaglandins. °· Surgery to remove adhesions, endometriosis, ovarian cyst, or fibroids. °· Removal of the uterus (hysterectomy). °· Progesterone shots to stop the menstrual period. °· Cutting the nerves on the sacrum that go to the female organs (presacral neurectomy). °· Electric current to the sacral nerves (sacral nerve stimulation). °· Antidepressant medicine. °· Psychiatric therapy, counseling, or group therapy. °· Exercise and physical therapy. °· Meditation and yoga therapy. °· Acupuncture. °HOME CARE INSTRUCTIONS  °· Only take over-the-counter or prescription medicines as directed by your health care provider. °· Place a heating pad or hot water bottle on your lower back or abdomen. Do not sleep with the heating  pad. °· Use aerobic exercises, walking, swimming, biking, and other exercises to help lessen the cramping. °· Massage to the lower back or abdomen may help. °· Stop smoking. °· Avoid alcohol and caffeine. °SEEK MEDICAL CARE IF:  °· Your pain does not get better with medicine. °· You have pain with sexual intercourse. °· Your pain increases and is not controlled with medicines. °· You have abnormal vaginal bleeding with your period. °· You develop nausea or vomiting with your period that is not controlled with medicine. °SEEK IMMEDIATE MEDICAL CARE IF:  °You pass out.  °  °This information is not intended to replace advice given to you by your health care provider. Make sure you discuss any questions you have with your health care provider. °  °Document Released: 01/13/2005 Document Revised: 09/15/2012 Document Reviewed: 07/01/2012 °Elsevier Interactive Patient Education ©2016 [employer] Inc. ° °

## 2015-12-04 ENCOUNTER — Emergency Department (HOSPITAL_COMMUNITY): Payer: BLUE CROSS/BLUE SHIELD

## 2015-12-04 ENCOUNTER — Emergency Department (HOSPITAL_COMMUNITY)
Admission: EM | Admit: 2015-12-04 | Discharge: 2015-12-04 | Disposition: A | Discharge status: Home / Self Care | Payer: BLUE CROSS/BLUE SHIELD | Attending: Emergency Medicine | Admitting: Emergency Medicine

## 2015-12-04 ENCOUNTER — Encounter (HOSPITAL_COMMUNITY): Payer: Self-pay

## 2015-12-04 DIAGNOSIS — R0789 Other chest pain: Secondary | ICD-10-CM | POA: Diagnosis not present

## 2015-12-04 DIAGNOSIS — Z7984 Long term (current) use of oral hypoglycemic drugs: Secondary | ICD-10-CM | POA: Insufficient documentation

## 2015-12-04 DIAGNOSIS — E1165 Type 2 diabetes mellitus with hyperglycemia: Secondary | ICD-10-CM | POA: Diagnosis not present

## 2015-12-04 DIAGNOSIS — Z87891 Personal history of nicotine dependence: Secondary | ICD-10-CM | POA: Insufficient documentation

## 2015-12-04 DIAGNOSIS — H9201 Otalgia, right ear: Secondary | ICD-10-CM | POA: Diagnosis not present

## 2015-12-04 DIAGNOSIS — R0602 Shortness of breath: Secondary | ICD-10-CM | POA: Diagnosis present

## 2015-12-04 DIAGNOSIS — R739 Hyperglycemia, unspecified: Secondary | ICD-10-CM

## 2015-12-04 DIAGNOSIS — I10 Essential (primary) hypertension: Secondary | ICD-10-CM | POA: Insufficient documentation

## 2015-12-04 LAB — I-STAT CHEM 8, ED
BUN: 12 mg/dL (ref 6–20)
CALCIUM ION: 1.11 mmol/L — AB (ref 1.15–1.40)
CHLORIDE: 101 mmol/L (ref 101–111)
CREATININE: 0.7 mg/dL (ref 0.44–1.00)
GLUCOSE: 313 mg/dL — AB (ref 65–99)
HCT: 44 % (ref 36.0–46.0)
Hemoglobin: 15 g/dL (ref 12.0–15.0)
Potassium: 3.8 mmol/L (ref 3.5–5.1)
Sodium: 137 mmol/L (ref 135–145)
TCO2: 24 mmol/L (ref 0–100)

## 2015-12-04 LAB — I-STAT BETA HCG BLOOD, ED (MC, WL, AP ONLY)

## 2015-12-04 MED ORDER — KETOROLAC TROMETHAMINE 30 MG/ML IJ SOLN
10.0000 mg | Freq: Once | INTRAMUSCULAR | Status: DC
  Filled 2015-12-04: qty 1

## 2015-12-04 MED ORDER — KETOROLAC TROMETHAMINE 30 MG/ML IJ SOLN: 10.0000 mg | Freq: Once | INTRAMUSCULAR | Status: DC

## 2015-12-04 MED ORDER — ALBUTEROL SULFATE (2.5 MG/3ML) 0.083% IN NEBU
5.0000 mg | INHALATION_SOLUTION | Freq: Once | RESPIRATORY_TRACT | Status: AC
  Administered 2015-12-04: 5 mg via RESPIRATORY_TRACT
  Filled 2015-12-04: qty 6

## 2015-12-04 MED ORDER — CIPROFLOXACIN-DEXAMETHASONE 0.3-0.1 % OT SUSP
4.0000 [drp] | Freq: Once | OTIC | Status: AC
  Administered 2015-12-04: 4 [drp] via OTIC
  Filled 2015-12-04: qty 7.5

## 2015-12-04 MED ORDER — ONDANSETRON 8 MG PO TBDP
8.0000 mg | ORAL_TABLET | Freq: Once | ORAL | Status: AC
  Administered 2015-12-04: 8 mg via ORAL
  Filled 2015-12-04: qty 1

## 2015-12-04 NOTE — ED Provider Notes (Signed)
[facility]-EMERGENCY DEPT Provider Note   CSN: [id number] Arrival date & time: 12/04/15  [id number]     History   Chief Complaint Chief Complaint  Patient presents with  . Shortness of Breath  . Generalized Body Aches    HPI Laura Benson is a 25 y.o. female.  HPI Patient presents with multiple complaints. Patient has history of hypertension, diabetes, denies history of respiratory disease. Laura Benson notes that after having a social event 3 nights ago, Laura Benson awoke with "hangover" symptoms.  Laura Benson was transiently better the following day, but subsequently has developed dyspnea, weakness, body aches, dizziness, right ear discomfort, nausea. No relief with OTC medication. No confusion, no disorientation, no fever.    Past Medical History:  Diagnosis Date  . Depression   . Diabetes mellitus without complication (HCC)   . Hypertension     Patient Active Problem List   Diagnosis Date Noted  . Dysmenorrhea 12/25/2013  . Rectal bleeding 10/20/2012  . DEPRESSION 01/05/2009  . BREAST PAIN, BILATERAL 01/05/2009  . INTERTRIGO, CANDIDAL 01/05/2009  . ABSENCE OF MENSTRUATION 09/20/2008  . DELAYED MENSES 08/25/2008  . ASCUS PAP 05/05/2008  . OBESITY, CLASS III 03/26/2006  . HYPERTENSION, BENIGN SYSTEMIC 03/26/2006    Past Surgical History:  Procedure Laterality Date  . ADENOIDECTOMY    . TONSILLECTOMY      OB History    Gravida Para Term Preterm AB Living   0             SAB TAB Ectopic Multiple Live Births                   Home Medications    Prior to Admission medications   Medication Sig Start Date End Date Taking? Authorizing Provider  albuterol (PROVENTIL HFA;VENTOLIN HFA) 108 (90 Base) MCG/ACT inhaler Inhale 1-2 puffs into the lungs every 6 (six) hours as needed for wheezing or shortness of breath.   Yes Historical Provider, MD  ibuprofen (ADVIL,MOTRIN) 200 MG tablet Take 400 mg by mouth every 6 (six) hours as needed for fever, headache, mild pain, moderate pain or cramping.    Yes Historical Provider, MD  enalapril (VASOTEC) 5 MG tablet Take 1 tablet (5 mg total) by mouth daily. Patient not taking: Reported on 12/04/2015 05/23/15   Marny LowensteinJulie N Wenzel, PA-C  metFORMIN (GLUCOPHAGE) 500 MG tablet Take 1 tablet (500 mg total) by mouth daily with breakfast. Patient not taking: Reported on 12/04/2015 05/23/15   Marny LowensteinJulie N Wenzel, PA-C  oxyCODONE-acetaminophen (PERCOCET/ROXICET) 5-325 MG tablet Take 1 tablet by mouth every 6 (six) hours as needed for severe pain. Patient not taking: Reported on 12/04/2015 05/23/15   Marny LowensteinJulie N Wenzel, PA-C    Family History Family History  Problem Relation Age of Onset  . Hypertension Mother   . Diabetes Mother     Social History Social History  Substance Use Topics  . Smoking status: Former Games developermoker  . Smokeless tobacco: Never Used  . Alcohol use No     Allergies   Patient has no known allergies.   Review of Systems Review of Systems  Constitutional:       Per HPI, otherwise negative  HENT:       Per HPI, otherwise negative  Respiratory:       Per HPI, otherwise negative  Cardiovascular:       Per HPI, otherwise negative  Gastrointestinal: Positive for nausea and vomiting.  Endocrine:       Negative aside from HPI  Genitourinary:  Neg aside from HPI   Musculoskeletal:       Per HPI, otherwise negative  Skin: Negative.   Allergic/Immunologic: Negative for immunocompromised state.  Neurological: Positive for weakness. Negative for syncope.     Physical Exam Updated Vital Signs BP 152/72   Pulse 95   Temp 98 F (36.7 C) (Oral)   Resp 24   SpO2 96%   Physical Exam  Constitutional: Laura Benson is oriented to person, place, and time. Laura Benson appears well-developed and well-nourished. No distress.  Morbidly obese young female awake, alert, speaking clearly  HENT:  Head: Normocephalic and atraumatic.  Right Ear: External ear normal.  Left Ear: Tympanic membrane, external ear and ear canal normal.  Ears:  Eyes: Conjunctivae  and EOM are normal.  Cardiovascular: Normal rate and regular rhythm.   Pulmonary/Chest: Effort normal and breath sounds normal. No stridor. No respiratory distress.  Abdominal: Laura Benson exhibits no distension.  Musculoskeletal: Laura Benson exhibits no edema.  Neurological: Laura Benson is alert and oriented to person, place, and time. No cranial nerve deficit.  Skin: Skin is warm and dry.  Psychiatric: Laura Benson has a normal mood and affect.  Nursing note and vitals reviewed.    ED Treatments / Results  Labs (all labs ordered are listed, but only abnormal results are displayed) Labs Reviewed  I-STAT CHEM 8, ED - Abnormal; Notable for the following:       Result Value   Glucose, Bld 313 (*)    Calcium, Ion 1.11 (*)    All other components within normal limits  I-STAT BETA HCG BLOOD, ED ([facility], [facility], [facility] ONLY)    EKG with sinus rhythm, rate 92, T-wave inversions in multiple leads, unchanged from prior, abnormal   Radiology Dg Chest 2 View  Result Date: 12/04/2015 CLINICAL DATA:  Shortness of breath and dizziness EXAM: CHEST  2 VIEW COMPARISON:  May 23, 2015 FINDINGS: Lungs are clear. Heart size and pulmonary vascularity are normal. No adenopathy. No bone lesions. IMPRESSION: No edema or consolidation. Electronically Signed   By: Bretta BangWilliam  Woodruff III M.D.   On: 12/04/2015 07:19    Procedures Procedures (including critical care time)  Medications Ordered in ED Medications  ketorolac (TORADOL) 30 MG/ML injection 9.9 mg (9.9 mg Intramuscular Refused 12/04/15 0830)  albuterol (PROVENTIL) (2.5 MG/3ML) 0.083% nebulizer solution 5 mg (5 mg Nebulization Given 12/04/15 0731)  ondansetron (ZOFRAN-ODT) disintegrating tablet 8 mg (8 mg Oral Given 12/04/15 0830)  ciprofloxacin-dexamethasone (CIPRODEX) 0.3-0.1 % otic suspension 4 drop (4 drops Right Ear Given 12/04/15 0830)     Initial Impression / Assessment and Plan / ED Course  I have reviewed the triage vital signs and the nursing notes.  Pertinent labs & imaging  results that were available during my care of the patient were reviewed by me and considered in my medical decision making (see chart for details). On repeat exam the patient is in no distress. We discussed all findings, including hyperglycemia. We discussed the importance of following up with primary care, using the provided otic medication as directed.  This obese young female presents with several days of generalized discomfort, nausea. Patient also has chest pain. Evaluation here reassuring, with no evidence for pneumonia, ACS, hemodynamically instability, bacteremia, sepsis.  Symptoms maybe secondary to the patient's medical condition versus recent alcohol intake. Patient does have some evidence for early ear infection, and was provided appropriate medication for this.     Gerhard Munchobert Asenath Balash, MD 12/04/15 [id number]

## 2015-12-04 NOTE — ED Triage Notes (Signed)
Pt complains of being short of breath, dizzy, and generalized body aches for two days

## 2015-12-04 NOTE — Discharge Instructions (Signed)
As discussed, your evaluation today has been largely reassuring.  But, it is important that you monitor your condition carefully, and do not hesitate to return to the ED if you develop new, or concerning changes in your condition.  Please use the provided ear drops twice daily for the next 5 days.    Otherwise, please follow-up with your physician for appropriate ongoing care.

## 2015-12-31 ENCOUNTER — Encounter (HOSPITAL_COMMUNITY): Payer: Self-pay | Admitting: Oncology

## 2015-12-31 ENCOUNTER — Emergency Department (HOSPITAL_COMMUNITY)
Admission: EM | Admit: 2015-12-31 | Discharge: 2015-12-31 | Disposition: A | Discharge status: Home / Self Care | Payer: BLUE CROSS/BLUE SHIELD | Attending: Emergency Medicine | Admitting: Emergency Medicine

## 2015-12-31 DIAGNOSIS — N390 Urinary tract infection, site not specified: Secondary | ICD-10-CM

## 2015-12-31 DIAGNOSIS — I10 Essential (primary) hypertension: Secondary | ICD-10-CM | POA: Insufficient documentation

## 2015-12-31 DIAGNOSIS — A5901 Trichomonal vulvovaginitis: Secondary | ICD-10-CM

## 2015-12-31 DIAGNOSIS — Z87891 Personal history of nicotine dependence: Secondary | ICD-10-CM | POA: Insufficient documentation

## 2015-12-31 DIAGNOSIS — Z7984 Long term (current) use of oral hypoglycemic drugs: Secondary | ICD-10-CM | POA: Insufficient documentation

## 2015-12-31 DIAGNOSIS — E119 Type 2 diabetes mellitus without complications: Secondary | ICD-10-CM | POA: Insufficient documentation

## 2015-12-31 DIAGNOSIS — Z79899 Other long term (current) drug therapy: Secondary | ICD-10-CM | POA: Insufficient documentation

## 2015-12-31 HISTORY — DX: Morbid (severe) obesity due to excess calories: E66.01

## 2015-12-31 LAB — CBC WITH DIFFERENTIAL/PLATELET
BASOS ABS: 0 10*3/uL (ref 0.0–0.1)
BASOS PCT: 0 %
EOS ABS: 0.1 10*3/uL (ref 0.0–0.7)
Eosinophils Relative: 1 %
HCT: 41.4 % (ref 36.0–46.0)
Hemoglobin: 13.4 g/dL (ref 12.0–15.0)
LYMPHS PCT: 41 %
Lymphs Abs: 2.9 10*3/uL (ref 0.7–4.0)
MCH: 24.1 pg — AB (ref 26.0–34.0)
MCHC: 32.4 g/dL (ref 30.0–36.0)
MCV: 74.5 fL — ABNORMAL LOW (ref 78.0–100.0)
MONO ABS: 0.6 10*3/uL (ref 0.1–1.0)
Monocytes Relative: 8 %
NEUTROS PCT: 50 %
Neutro Abs: 3.4 10*3/uL (ref 1.7–7.7)
PLATELETS: 272 10*3/uL (ref 150–400)
RBC: 5.56 MIL/uL — AB (ref 3.87–5.11)
RDW: 14.1 % (ref 11.5–15.5)
WBC: 7 10*3/uL (ref 4.0–10.5)

## 2015-12-31 LAB — COMPREHENSIVE METABOLIC PANEL
ALBUMIN: 3.9 g/dL (ref 3.5–5.0)
ALT: 18 U/L (ref 14–54)
AST: 17 U/L (ref 15–41)
Alkaline Phosphatase: 70 U/L (ref 38–126)
Anion gap: 9 (ref 5–15)
BUN: 7 mg/dL (ref 6–20)
CHLORIDE: 102 mmol/L (ref 101–111)
CO2: 26 mmol/L (ref 22–32)
CREATININE: 0.76 mg/dL (ref 0.44–1.00)
Calcium: 8.8 mg/dL — ABNORMAL LOW (ref 8.9–10.3)
GFR calc non Af Amer: >60 mL/min (ref >60)
GLUCOSE: 191 mg/dL — AB (ref 65–99)
Potassium: 3.8 mmol/L (ref 3.5–5.1)
SODIUM: 137 mmol/L (ref 135–145)
Total Bilirubin: 1 mg/dL (ref 0.3–1.2)
Total Protein: 7.5 g/dL (ref 6.5–8.1)

## 2015-12-31 LAB — I-STAT BETA HCG BLOOD, ED (MC, WL, AP ONLY)

## 2015-12-31 LAB — WET PREP, GENITAL
Clue Cells Wet Prep HPF POC: NONE SEEN
SPERM: NONE SEEN
Yeast Wet Prep HPF POC: NONE SEEN

## 2015-12-31 LAB — URINALYSIS, ROUTINE W REFLEX MICROSCOPIC
Bilirubin Urine: NEGATIVE
Glucose, UA: NEGATIVE mg/dL
HGB URINE DIPSTICK: NEGATIVE
Ketones, ur: NEGATIVE mg/dL
NITRITE: NEGATIVE
Protein, ur: NEGATIVE mg/dL
SPECIFIC GRAVITY, URINE: 1.023 (ref 1.005–1.030)
pH: 5.5 (ref 5.0–8.0)

## 2015-12-31 LAB — URINE MICROSCOPIC-ADD ON

## 2015-12-31 LAB — LIPASE, BLOOD: Lipase: 16 U/L (ref 11–51)

## 2015-12-31 MED ORDER — CEPHALEXIN 500 MG PO CAPS: 500.0000 mg | ORAL_CAPSULE | Freq: Four times a day (QID) | ORAL | 0 refills | Status: DC

## 2015-12-31 MED ORDER — METRONIDAZOLE 500 MG PO TABS
2000.0000 mg | ORAL_TABLET | Freq: Once | ORAL | Status: AC
  Administered 2015-12-31: 2000 mg via ORAL
  Filled 2015-12-31: qty 4

## 2015-12-31 NOTE — ED Provider Notes (Signed)
[facility]-EMERGENCY DEPT Provider Note   CSN: [id number] Arrival date & time: 12/31/15  0214     History   Chief Complaint Chief Complaint  Patient presents with  . Abdominal Pain    HPI Laura RossettiKimberly Klostermann is a 25 y.o. female.  Patient with history of DM, HTN, morbid obesity presents with lower abdominal pain x 1 day. No fever, nausea, vomiting or change in bowel habits. She reports vaginal irritation and a vaginal discharge. No dysuria or hematuria. She states her menses has been irregular for the past several months, which has been evaluated without finding an answer. She states it is unknown if she is pregnant.    The history is provided by the patient. No language interpreter was used.    Past Medical History:  Diagnosis Date  . Depression   . Diabetes mellitus without complication (HCC)   . Hypertension   . Morbid obesity [facility](HCC)     Patient Active Problem List   Diagnosis Date Noted  . Dysmenorrhea 12/25/2013  . Rectal bleeding 10/20/2012  . DEPRESSION 01/05/2009  . BREAST PAIN, BILATERAL 01/05/2009  . INTERTRIGO, CANDIDAL 01/05/2009  . ABSENCE OF MENSTRUATION 09/20/2008  . DELAYED MENSES 08/25/2008  . ASCUS PAP 05/05/2008  . OBESITY, CLASS III 03/26/2006  . HYPERTENSION, BENIGN SYSTEMIC 03/26/2006    Past Surgical History:  Procedure Laterality Date  . ADENOIDECTOMY    . TONSILLECTOMY      OB History    Gravida Para Term Preterm AB Living   0             SAB TAB Ectopic Multiple Live Births                   Home Medications    Prior to Admission medications   Medication Sig Start Date End Date Taking? Authorizing Provider  albuterol (PROVENTIL HFA;VENTOLIN HFA) 108 (90 Base) MCG/ACT inhaler Inhale 1-2 puffs into the lungs every 6 (six) hours as needed for wheezing or shortness of breath.   Yes Historical Provider, MD  ibuprofen (ADVIL,MOTRIN) 200 MG tablet Take 400 mg by mouth every 6 (six) hours as needed for fever, headache, mild pain, moderate pain  or cramping.   Yes Historical Provider, MD  enalapril (VASOTEC) 5 MG tablet Take 1 tablet (5 mg total) by mouth daily. Patient not taking: Reported on 12/31/2015 05/23/15   Marny LowensteinJulie N Wenzel, PA-C  metFORMIN (GLUCOPHAGE) 500 MG tablet Take 1 tablet (500 mg total) by mouth daily with breakfast. Patient not taking: Reported on 12/31/2015 05/23/15   Marny LowensteinJulie N Wenzel, PA-C    Family History Family History  Problem Relation Age of Onset  . Hypertension Mother   . Diabetes Mother     Social History Social History  Substance Use Topics  . Smoking status: Former Games developermoker  . Smokeless tobacco: Never Used  . Alcohol use No     Allergies   Patient has no known allergies.   Review of Systems Review of Systems  Constitutional: Negative for chills and fever.  HENT: Negative.   Respiratory: Negative.  Negative for shortness of breath.   Cardiovascular: Negative.  Negative for chest pain.  Gastrointestinal: Positive for abdominal pain. Negative for nausea and vomiting.  Genitourinary: Positive for menstrual problem, vaginal discharge and vaginal pain. Negative for dysuria and hematuria.  Musculoskeletal: Negative.  Negative for myalgias.  Skin: Negative.   Neurological: Negative.      Physical Exam Updated Vital Signs BP (!) 156/106 (BP Location: Right Arm)  Pulse 91   Temp 98.4 F (36.9 C) (Oral)   Resp 22   Ht 5\' 7"  (1.702 m)   Wt (!) 219.5 kg   LMP 10/25/2015 (Exact Date)   SpO2 97%   BMI 75.81 kg/m   Physical Exam  Constitutional: She is oriented to person, place, and time. She appears well-developed and well-nourished.  Neck: Normal range of motion.  Pulmonary/Chest: Effort normal.  Abdominal: Soft.  Morbidly obese abdomen is non-tender to light and deep palpation.  Genitourinary:  Genitourinary Comments: External vagina unremarkable, without redness, rash or blistering. There is a small amount of white, thin vaginal discharge. No CMT, adnexal mass or tenderness.     Musculoskeletal: Normal range of motion.  Neurological: She is alert and oriented to person, place, and time.  Skin: Skin is warm and dry.     ED Treatments / Results  Labs (all labs ordered are listed, but only abnormal results are displayed) Labs Reviewed  WET PREP, GENITAL - Abnormal; Notable for the following:       Result Value   Trich, Wet Prep PRESENT (*)    WBC, Wet Prep HPF POC FEW (*)    All other components within normal limits  CBC WITH DIFFERENTIAL/PLATELET - Abnormal; Notable for the following:    RBC 5.56 (*)    MCV 74.5 (*)    MCH 24.1 (*)    All other components within normal limits  COMPREHENSIVE METABOLIC PANEL - Abnormal; Notable for the following:    Glucose, Bld 191 (*)    Calcium 8.8 (*)    All other components within normal limits  URINALYSIS, ROUTINE W REFLEX MICROSCOPIC (NOT AT [facility]) - Abnormal; Notable for the following:    APPearance CLOUDY (*)    Leukocytes, UA LARGE (*)    All other components within normal limits  URINE MICROSCOPIC-ADD ON - Abnormal; Notable for the following:    Squamous Epithelial / LPF 6-30 (*)    Bacteria, UA RARE (*)    All other components within normal limits  LIPASE, BLOOD  I-STAT BETA HCG BLOOD, ED ([facility], [facility], [facility] ONLY)  GC/CHLAMYDIA PROBE AMP ([place]) NOT AT [facility]    EKG  EKG Interpretation None       Radiology No results found.  Procedures Procedures (including critical care time)  Medications Ordered in ED Medications - No data to display   Initial Impression / Assessment and Plan / ED Course  I have reviewed the triage vital signs and the nursing notes.  Pertinent labs & imaging results that were available during my care of the patient were reviewed by me and considered in my medical decision making (see chart for details).  Clinical Course     Patient presents to ED for evaluation of abdominal and vaginal pain. Does not know if she's pregnant. No fever, N, V.   On exam, abdomen is benign  and non-tender. There is evidence of trichomonas and a UTI on lab testing. She is well appearing and comfortable. Will treat with abx, Flagyl.   The patient states she comes to the emergency department for routine medical care and treatment for her diabetes. Discussed the importance of establishing with an outpatient PCP.   Final Clinical Impressions(s) / ED Diagnoses   Final diagnoses:  None   1. UTI 2. Trichomonas vaginalis  New Prescriptions New Prescriptions   No medications on file     Danne HarborShari Lanique Gonzalo, PA-C 12/31/15 [phone]    April Palumbo, MD 12/31/15 [phone]

## 2015-12-31 NOTE — ED Triage Notes (Signed)
Pt c/o lower abdominal pain as well as vaginal "discomfort."  Pt states sx have been present x 3 days.  Possibility pt may be pregnant however all the home tests she has taken were negative.  +frequency & urgency.

## 2016-01-01 LAB — GC/CHLAMYDIA PROBE AMP (~~LOC~~) NOT AT ARMC
Chlamydia: NEGATIVE
Neisseria Gonorrhea: NEGATIVE

## 2016-01-07 ENCOUNTER — Ambulatory Visit: Payer: BLUE CROSS/BLUE SHIELD | Admitting: Family Medicine

## 2016-01-23 ENCOUNTER — Encounter: Payer: Self-pay | Admitting: Family Medicine

## 2016-01-23 ENCOUNTER — Ambulatory Visit: Payer: BLUE CROSS/BLUE SHIELD | Attending: Family Medicine | Admitting: Family Medicine

## 2016-01-23 VITALS — BP 151/76 | HR 97 | Temp 98.5°F | Resp 18 | Ht 67.0 in | Wt >= 6400 oz

## 2016-01-23 DIAGNOSIS — E119 Type 2 diabetes mellitus without complications: Secondary | ICD-10-CM | POA: Diagnosis not present

## 2016-01-23 DIAGNOSIS — Z7689 Persons encountering health services in other specified circumstances: Secondary | ICD-10-CM

## 2016-01-23 DIAGNOSIS — Z79899 Other long term (current) drug therapy: Secondary | ICD-10-CM | POA: Insufficient documentation

## 2016-01-23 DIAGNOSIS — O119 Pre-existing hypertension with pre-eclampsia, unspecified trimester: Secondary | ICD-10-CM | POA: Insufficient documentation

## 2016-01-23 DIAGNOSIS — O10919 Unspecified pre-existing hypertension complicating pregnancy, unspecified trimester: Secondary | ICD-10-CM | POA: Insufficient documentation

## 2016-01-23 DIAGNOSIS — Z8759 Personal history of other complications of pregnancy, childbirth and the puerperium: Secondary | ICD-10-CM | POA: Insufficient documentation

## 2016-01-23 DIAGNOSIS — I10 Essential (primary) hypertension: Secondary | ICD-10-CM | POA: Diagnosis not present

## 2016-01-23 DIAGNOSIS — Z7984 Long term (current) use of oral hypoglycemic drugs: Secondary | ICD-10-CM | POA: Insufficient documentation

## 2016-01-23 LAB — GLUCOSE, POCT (MANUAL RESULT ENTRY): POC GLUCOSE: 181 mg/dL — AB (ref 70–99)

## 2016-01-23 LAB — POCT URINE PREGNANCY: PREG TEST UR: NEGATIVE

## 2016-01-23 LAB — POCT HEMOGLOBIN: Hemoglobin: 10.3 g/dL — AB (ref 12.2–16.2)

## 2016-01-23 MED ORDER — LISINOPRIL 10 MG PO TABS: 10.0000 mg | ORAL_TABLET | Freq: Every day | ORAL | 2 refills | Status: DC

## 2016-01-23 MED ORDER — METFORMIN HCL 500 MG PO TABS: 500.0000 mg | ORAL_TABLET | Freq: Two times a day (BID) | ORAL | 2 refills | Status: DC

## 2016-01-23 MED ORDER — HYDROCHLOROTHIAZIDE 25 MG PO TABS: 25.0000 mg | ORAL_TABLET | Freq: Every day | ORAL | 2 refills | Status: DC

## 2016-01-23 MED ORDER — ALBUTEROL SULFATE HFA 108 (90 BASE) MCG/ACT IN AERS: 2.0000 | INHALATION_SPRAY | Freq: Four times a day (QID) | RESPIRATORY_TRACT | 2 refills | Status: DC | PRN

## 2016-01-23 MED ORDER — TRUE METRIX METER W/DEVICE KIT: 1.0000 | PACK | Freq: Once | 0 refills | Status: AC

## 2016-01-23 MED ORDER — GLUCOSE BLOOD VI STRP: ORAL_STRIP | 12 refills | Status: DC

## 2016-01-23 MED ORDER — TRUEPLUS LANCETS 28G MISC: 1.0000 | Freq: Once | 12 refills | Status: AC

## 2016-01-23 MED FILL — TRUEplus LANCETS 28G MISC: 25 days supply | Qty: 100 | Fill #0

## 2016-01-23 MED FILL — TRUE METRIX BLOOD GLUCOSE M: W/DEVICE | 1 days supply | Qty: 1 | Fill #0

## 2016-01-23 MED FILL — HYDROCHLOROTHIAZIDE 25 MG T: 25 | 30 days supply | Qty: 30 | Fill #0

## 2016-01-23 MED FILL — !VENTOLIN HFA INHALER: 108 (90 BAS | 25 days supply | Qty: 18 | Fill #0

## 2016-01-23 MED FILL — LISINOPRIL 10 MG TABLET: 10 | 30 days supply | Qty: 30 | Fill #0

## 2016-01-23 MED FILL — ?METFORMIN HCL 500MG TABLET: 500 | 30 days supply | Qty: 60 | Fill #0

## 2016-01-23 NOTE — Progress Notes (Signed)
Patient is here for F/up on Diabetes and Hypertension  Patient declined the flu shot today.

## 2016-01-23 NOTE — Patient Instructions (Addendum)
Come back in 1 week for BP check with the nurse.  Type 2 Diabetes Mellitus, Self Care, Adult When you have type 2 diabetes (type 2 diabetes mellitus), you must keep your blood sugar (glucose) under control. You can do this with:  Nutrition.  Exercise.  Lifestyle changes.  Medicines or insulin, if needed.  Support from your doctors and others. How do I manage my blood sugar?  Check your blood sugar level every day, as often as told.  Call your doctor if your blood sugar is above your goal numbers for 2 tests in a row.  Have your A1c (hemoglobin A1c) level checked at least two times a year. Have it checked more often if your doctor tells you to. Your doctor will set treatment goals for you. Generally, you should have these blood sugar levels:  Before meals (preprandial): 80-130 mg/dL (4.4-7.2 mmol/L).  After meals (postprandial): lower than 180 mg/dL (10 mmol/L).  A1c level: less than 7%. What do I need to know about high blood sugar? High blood sugar is called hyperglycemia. Know the signs of high blood sugar. Signs may include:  Feeling:  Thirsty.  Hungry.  Very tired.  Needing to pee (urinate) more than usual.  Blurry vision. What do I need to know about low blood sugar? Low blood sugar is called hypoglycemia. This is when blood sugar is at or below 70 mg/dL (3.9 mmol/L). Symptoms may include:  Feeling:  Hungry.  Worried or nervous (anxious).  Sweaty and clammy.  Confused.  Dizzy.  Sleepy.  Sick to your stomach (nauseous).  Having:  A fast heartbeat (palpitations).  A headache.  A change in your vision.  Jerky movements that you cannot control (seizure).  Nightmares.  Tingling or no feeling (numbness) around the mouth, lips, or tongue.  Having trouble with:  Talking.  Paying attention (concentrating).  Moving (coordination).  Sleeping.  Shaking.  Passing out (fainting).  Getting upset easily (irritability). Treating low blood  sugar  To treat low blood sugar, eat or drink something sugary right away. If you can think clearly and swallow safely, follow the 15:15 rule:  Take 15 grams of a fast-acting carb (carbohydrate). Some fast-acting carbs are:  1 tube of glucose gel.  3 sugar tablets (glucose pills).  6-8 pieces of hard candy.  4 oz (120 mL) of fruit juice.  4 oz (120 mL) regular (not diet) soda.  Check your blood sugar 15 minutes after you take the carb.  If your blood sugar is still at or below 70 mg/dL (3.9 mmol/L), take 15 grams of a carb again.  If your blood sugar does not go above 70 mg/dL (3.9 mmol/L) after 3 tries, get help right away.  After your blood sugar goes back to normal, eat a meal or a snack within 1 hour. Treating very low blood sugar  If your blood sugar is at or below 54 mg/dL (3 mmol/L), you have very low blood sugar (severe hypoglycemia). This is an emergency. Do not wait to see if the symptoms will go away. Get medical help right away. Call your local emergency services (911 in the [place] Do not drive yourself to the hospital. If you have very low blood sugar and you cannot eat or drink, you may need a glucagon shot (injection). A family member or friend should learn how to check your blood sugar and how to give you a glucagon shot. Ask your doctor if you need to have a glucagon shot kit at home. What  else is important to manage my diabetes? Medicine  Follow these instructions about insulin and diabetes medicines:  Take them as told by your doctor.  Adjust them as told by your doctor.  Do not run out of them. Having diabetes can raise your risk for other long-term conditions. These include heart or kidney disease. Your doctor may prescribe medicines to help prevent problems from diabetes. Food   Make healthy food choices. These include:  Chicken, fish, egg whites, and beans.  Oats, whole wheat, bulgur, brown rice, quinoa, and millet.  Fresh fruits and  vegetables.  Low-fat dairy products.  Nuts, avocado, olive oil, and canola oil.  Make a food plan with a specialist (dietitian).  Follow instructions from your doctor about what you cannot eat or drink.  Drink enough fluid to keep your pee (urine) clear or pale yellow.  Eat healthy snacks between healthy meals.  Keep track of carbs that you eat. Read food labels. Learn food serving sizes.  Follow your sick day plan when you cannot eat or drink normally. Make this plan with your doctor so it is ready to use. Activity  Exercise at least 3 times a week.  Do not go more than 2 days without exercising.  Talk with your doctor before you start a new exercise. Your doctor may need to adjust your insulin, medicines, or food. Lifestyle   Do not use any tobacco products. These include cigarettes, chewing tobacco, and e-cigarettes.If you need help quitting, ask your doctor.  Ask your doctor how much alcohol is safe for you.  Learn to deal with stress. If you need help with this, ask your doctor. Body care  Stay up to date with your shots (immunizations).  Have your eyes and feet checked by a doctor as often as told.  Check your skin and feet every day. Check for cuts, bruises, redness, blisters, or sores.  Brush your teeth and gums two times a day.  Floss at least one time a day.  Go to the dentist least one time every 6 months.  Stay at a healthy weight. General instructions   Take over-the-counter and prescription medicines only as told by your doctor.  Share your diabetes care plan with:  Your work or school.  People you live with.  Check your pee (urine) for ketones:  When you are sick.  As told by your doctor.  Carry a card or wear jewelry that says that you have diabetes.  Ask your doctor:  Do I need to meet with a diabetes educator?  Where can I find a support group for people with diabetes?  Keep all follow-up visits as told by your doctor. This is  important. Where to find more information: To learn more about diabetes, visit:  American Diabetes Association: www.diabetes.org  American Association of Diabetes Educators: www.diabeteseducator.org/patient-resources This information is not intended to replace advice given to you by your health care provider. Make sure you discuss any questions you have with your health care provider. Document Released: 05/07/2015 Document Revised: 06/21/2015 Document Reviewed: 02/16/2015 Elsevier Interactive Patient Education  2017 [employer]. Exercising to [employer] Introduction Exercising can help you to lose weight. In order to lose weight through exercise, you need to do vigorous-intensity exercise. You can tell that you are exercising with vigorous intensity if you are breathing very hard and fast and cannot hold a conversation while exercising. Moderate-intensity exercise helps to maintain your current weight. You can tell that you are exercising at a moderate level  if you have a higher heart rate and faster breathing, but you are still able to hold a conversation. How often should I exercise? Choose an activity that you enjoy and set realistic goals. Your health care provider can help you to make an activity plan that works for you. Exercise regularly as directed by your health care provider. This may include:  Doing resistance training twice each week, such as:  Push-ups.  Sit-ups.  Lifting weights.  Using resistance bands.  Doing a given intensity of exercise for a given amount of time. Choose from these options:  150 minutes of moderate-intensity exercise every week.  75 minutes of vigorous-intensity exercise every week.  A mix of moderate-intensity and vigorous-intensity exercise every week. Children, pregnant women, people who are out of shape, people who are overweight, and older adults may need to consult a health care provider for individual recommendations. If you have any sort  of medical condition, be sure to consult your health care provider before starting a new exercise program. What are some activities that can help me to lose weight?  Walking at a rate of at least 4.5 miles an hour.  Jogging or running at a rate of 5 miles per hour.  Biking at a rate of at least 10 miles per hour.  Lap swimming.  Roller-skating or in-line skating.  Cross-country skiing.  Vigorous competitive sports, such as football, basketball, and soccer.  Jumping rope.  Aerobic dancing. How can I be more active in my day-to-day activities?  Use the stairs instead of the elevator.  Take a walk during your lunch break.  If you drive, park your car farther away from work or school.  If you take public transportation, get off one stop early and walk the rest of the way.  Make all of your phone calls while standing up and walking around.  Get up, stretch, and walk around every 30 minutes throughout the day. What guidelines should I follow while exercising?  Do not exercise so much that you hurt yourself, feel dizzy, or get very short of breath.  Consult your health care provider prior to starting a new exercise program.  Wear comfortable clothes and shoes with good support.  Drink plenty of water while you exercise to prevent dehydration or heat stroke. Body water is lost during exercise and must be replaced.  Work out until you breathe faster and your heart beats faster. This information is not intended to replace advice given to you by your health care provider. Make sure you discuss any questions you have with your health care provider. Document Released: 02/15/2010 Document Revised: 06/21/2015 Document Reviewed: 06/16/2013  2017 Elsevier Managing Your Hypertension Hypertension is commonly called high blood pressure. Blood pressure is a measurement of how strongly your blood is pressing against the walls of your arteries. Arteries are blood vessels that carry blood  from your heart throughout your body. Blood pressure does not stay the same. It rises when you are active, excited, or nervous. It lowers when you are sleeping or relaxed. If the numbers that measure your blood pressure stay above normal most of the time, you are at risk for health problems. Hypertension is a long-term (chronic) condition in which blood pressure is elevated. This condition often has no signs or symptoms. The cause of the condition is usually not known. What are blood pressure readings? A blood pressure reading is recorded as two numbers, such as "120 over 80" (or 120/80). The first ("top") number is called  the systolic pressure. It is a measure of the pressure in your arteries as the heart beats. The second ("bottom") number is called the diastolic pressure. It is a measure of the pressure in your arteries as the heart relaxes between beats. What does my blood pressure reading mean? Blood pressure is classified into four stages. Based on your blood pressure reading, your health care provider may use the following stages to determine what type of treatment, if any, is needed. Systolic pressure and diastolic pressure are measured in a unit called mm Hg. Normal  Systolic pressure: below 342.  Diastolic pressure: below 80. Prehypertension  Systolic pressure: 876-811.  Diastolic pressure: 57-26. Hypertension stage 1  Systolic pressure: 203-559.  Diastolic pressure: 74-16. Hypertension stage 2  Systolic pressure: 384 or above.  Diastolic pressure: 536 or above. What health risks are associated with hypertension? Managing your hypertension is an important responsibility. Uncontrolled hypertension can lead to:  A heart attack.  A stroke.  A weakened blood vessel (aneurysm).  Heart failure.  Kidney damage.  Eye damage.  Metabolic syndrome.  Memory and concentration problems. What changes can I make to manage my hypertension? Hypertension can be managed effectively  by making lifestyle changes and possibly by taking medicines. Your health care provider will help you come up with a plan to bring your blood pressure within a normal range. Your plan should include the following: Monitoring  Monitor your blood pressure at home as told by your health care provider. Your personal target blood pressure may vary depending on your medical conditions, your age, and other factors.  Have your blood pressure rechecked as told by your health care provider. Lifestyle  Lose weight if necessary.  Get at least 30-45 minutes of aerobic exercise at least 4 times a week.  Do not use any products that contain nicotine or tobacco, such as cigarettes and e-cigarettes. If you need help quitting, ask your health care provider.  Learn ways to reduce stress.  Control any chronic conditions, such as high cholesterol or diabetes. Eating and drinking  Follow the DASH diet. This diet is high in fruits, vegetables, and whole grains. It is low in salt, red meat, and added sugars.  Keep your sodium intake below 2,300 mg per day.  Limit alcoholic beverages. Communication  Review all the medicines you take with your health care provider because there may be side effects or interactions.  Talk with your health care provider about your diet, exercise habits, and other lifestyle factors that may be contributing to hypertension.  See your health care provider regularly. Your health care provider can help you create and adjust your plan for managing hypertension. Will I need medicine to control my blood pressure? Your health care provider may prescribe medicine if lifestyle changes are not enough to get your blood pressure under control, and if one of the following is true:  You are 29-60 years of age, and your systolic blood pressure is 140 or higher.  You are 85 years of age or older, and your systolic blood pressure is 150 or higher.  Your diastolic blood pressure is 90 or  higher.  You have diabetes, and your systolic blood pressure is over 468 or your diastolic blood pressure is over 90.  You have kidney disease, and your blood pressure is above 140/90.  You have heart disease or a history of stroke, and your blood pressure is 140/90 or higher. Take medicines only as told by your health care provider. Follow the directions  carefully. Blood pressure medicines must be taken as prescribed. The medicine does not work as well when you skip doses. Skipping doses also puts you at risk for problems. Contact a health care provider if:  You think you are having a reaction to medicines you have taken.  You have repeated (recurrent) headaches.  You feel dizzy.  You have swelling in your ankles.  You have trouble with your vision. Get help right away if:  You develop a severe headache or confusion.  You have unusual weakness or numbness, or you feel faint.  You have severe pain in your chest or abdomen.  You vomit repeatedly.  You have trouble breathing. This information is not intended to replace advice given to you by your health care provider. Make sure you discuss any questions you have with your health care provider. Document Released: 10/08/2011 Document Revised: 09/18/2015 Document Reviewed: 04/13/2015 Elsevier Interactive Patient Education  2017 [employer]. Lisinopril tablets What is this medicine? LISINOPRIL (lyse [place] pril) is an ACE inhibitor. This medicine is used to treat high blood pressure and heart failure. It is also used to protect the heart immediately after a heart attack. This medicine may be used for other purposes; ask your health care provider or pharmacist if you have questions. COMMON BRAND NAME(S): Prinivil, Zestril What should I tell my health care provider before I take this medicine? They need to know if you have any of these conditions: -diabetes -heart or blood vessel disease -kidney disease -low blood  pressure -previous swelling of the tongue, face, or lips with difficulty breathing, difficulty swallowing, hoarseness, or tightening of the throat -an unusual or allergic reaction to lisinopril, other ACE inhibitors, insect venom, foods, dyes, or preservatives -pregnant or trying to get pregnant -breast-feeding How should I use this medicine? Take this medicine by mouth with a glass of water. Follow the directions on your prescription label. You may take this medicine with or without food. If it upsets your stomach, take it with food. Take your medicine at regular intervals. Do not take it more often than directed. Do not stop taking except on your doctor's advice. Talk to your pediatrician regarding the use of this medicine in children. Special care may be needed. While this drug may be prescribed for children as young as 28 years of age for selected conditions, precautions do apply. Overdosage: If you think you have taken too much of this medicine contact a poison control center or emergency room at once. NOTE: This medicine is only for you. Do not share this medicine with others. What if I miss a dose? If you miss a dose, take it as soon as you can. If it is almost time for your next dose, take only that dose. Do not take double or extra doses. What may interact with this medicine? Do not take this medicine with any of the following medications: -hymenoptera venom -sacubitril; valsartan This medicines may also interact with the following medications: -aliskiren -angiotensin receptor blockers, like losartan or valsartan -certain medicines for diabetes -diuretics -everolimus -gold compounds -lithium -NSAIDs, medicines for pain and inflammation, like ibuprofen or naproxen -potassium salts or supplements -salt substitutes -sirolimus -temsirolimus This list may not describe all possible interactions. Give your health care provider a list of all the medicines, herbs, non-prescription drugs,  or dietary supplements you use. Also tell them if you smoke, drink alcohol, or use illegal drugs. Some items may interact with your medicine. What should I watch for while using this medicine?  Visit your doctor or health care professional for regular check ups. Check your blood pressure as directed. Ask your doctor what your blood pressure should be, and when you should contact him or her. Do not treat yourself for coughs, colds, or pain while you are using this medicine without asking your doctor or health care professional for advice. Some ingredients may increase your blood pressure. Women should inform their doctor if they wish to become pregnant or think they might be pregnant. There is a potential for serious side effects to an unborn child. Talk to your health care professional or pharmacist for more information. Check with your doctor or health care professional if you get an attack of severe diarrhea, nausea and vomiting, or if you sweat a lot. The loss of too much body fluid can make it dangerous for you to take this medicine. You may get drowsy or dizzy. Do not drive, use machinery, or do anything that needs mental alertness until you know how this drug affects you. Do not stand or sit up quickly, especially if you are an older patient. This reduces the risk of dizzy or fainting spells. Alcohol can make you more drowsy and dizzy. Avoid alcoholic drinks. Avoid salt substitutes unless you are told otherwise by your doctor or health care professional. What side effects may I notice from receiving this medicine? Side effects that you should report to your doctor or health care professional as soon as possible: -allergic reactions like skin rash, itching or hives, swelling of the hands, feet, face, lips, throat, or tongue -breathing problems -signs and symptoms of kidney injury like trouble passing urine or change in the amount of urine -signs and symptoms of increased potassium like muscle  weakness; chest pain; or fast, irregular heartbeat -signs and symptoms of liver injury like dark yellow or brown urine; general ill feeling or flu-like symptoms; light-colored stools; loss of appetite; nausea; right upper belly pain; unusually weak or tired; yellowing of the eyes or skin -signs and symptoms of low blood pressure like dizziness; feeling faint or lightheaded, falls; unusually weak or tired -stomach pain with or without nausea and vomiting Side effects that usually do not require medical attention (report to your doctor or health care professional if they continue or are bothersome): -changes in taste -cough -dizziness -fever -headache -sensitivity to light This list may not describe all possible side effects. Call your doctor for medical advice about side effects. You may report side effects to FDA at 1-800-FDA-1088. Where should I keep my medicine? Keep out of the reach of children. Store at room temperature between 15 and 30 degrees C (59 and 86 degrees F). Protect from moisture. Keep container tightly closed. Throw away any unused medicine after the expiration date. NOTE: This sheet is a summary. It may not cover all possible information. If you have questions about this medicine, talk to your doctor, pharmacist, or health care provider.  2017 Elsevier/[employer] Standard (2015-03-05 12:52:35)

## 2016-01-23 NOTE — Progress Notes (Signed)
Subjective:  Patient ID: Laura Benson, female    DOB: 22-May-1990  Age: 25 y.o. MRN: [id number]  CC: New Patient (Initial Visit)  HPI Laura Benson presents establish care for diabetes, hypertension, and medication refills. She reports not being able to get her medications due to losing her job and health insurance in the past. She denies any CP, SOB, or swelling of the extremities.  She does report increased thirst and increased urination.She denies having access to a glucose meter or supplies at home. She reports a desire to lose weight to become  pregnant in the future. She already reports losing 28 lbs with eliminating fried food from her diet.  Outpatient Medications Prior to Visit  Medication Sig Dispense Refill  . albuterol (PROVENTIL HFA;VENTOLIN HFA) 108 (90 Base) MCG/ACT inhaler Inhale 1-2 puffs into the lungs every 6 (six) hours as needed for wheezing or shortness of breath.    . cephALEXin (KEFLEX) 500 MG capsule Take 1 capsule (500 mg total) by mouth 4 (four) times daily. 20 capsule 0  . enalapril (VASOTEC) 5 MG tablet Take 1 tablet (5 mg total) by mouth daily. (Patient not taking: Reported on 12/31/2015) 60 tablet 0  . ibuprofen (ADVIL,MOTRIN) 200 MG tablet Take 400 mg by mouth every 6 (six) hours as needed for fever, headache, mild pain, moderate pain or cramping.    . metFORMIN (GLUCOPHAGE) 500 MG tablet Take 1 tablet (500 mg total) by mouth daily with breakfast. (Patient not taking: Reported on 12/31/2015) 90 tablet 0   No facility-administered medications prior to visit.    ROS Review of Systems  Respiratory: Negative.   Cardiovascular: Negative.   Endocrine: Positive for polydipsia and polyuria.  Skin: Negative.   Neurological: Negative for dizziness and light-headedness.   Objective:  LMP 10/25/2015 (Exact Date)   BP/Weight 12/31/2015 41/07/4079 05/01/8183  Systolic BP 631 497 026  Diastolic BP 68 72 93  Wt. (Lbs) 484 - -  BMI 75.81 - -   Physical Exam    Constitutional: She is oriented to person, place, and time. She appears well-developed and well-nourished.  Eyes: Pupils are equal, round, and reactive to light.  Neck: Normal range of motion. No JVD present.  Cardiovascular: Normal rate, regular rhythm, normal heart sounds and intact distal pulses.   Pulmonary/Chest: Breath sounds normal. No respiratory distress.  Abdominal: Soft. Bowel sounds are normal. She exhibits no distension and no mass. There is no tenderness.  Obese abdomen.  Neurological: She is alert and oriented to person, place, and time.  Skin: Skin is warm and dry.  Monofilament exam wnl.   Assessment & Plan:   Problem List Items Addressed This Visit      Cardiovascular and Mediastinum   Hypertension   Relevant Medications   hydrochlorothiazide (HYDRODIURIL) 25 MG tablet   lisinopril (PRINIVIL,ZESTRIL) 10 MG tablet     Endocrine   Diabetes mellitus without complication (HCC)   Relevant Medications   lisinopril (PRINIVIL,ZESTRIL) 10 MG tablet   metFORMIN (GLUCOPHAGE) 500 MG tablet   glucose blood (TRUE METRIX BLOOD GLUCOSE TEST) test strip   -Patient advised that if she plans on becoming pregnant in the near future to notify     provider so that alternative to lisinopril can be Benson.    Other Relevant Orders   Hemoglobin (Completed)   Glucose (CBG) (Completed)   Microalbumin / creatinine urine ratio (Completed)   Ambulatory referral to Nutrition and Diabetic Education   Lipid panel    Other Visit Diagnoses  Encounter before starting medication    -  Primary   Relevant Orders   POCT urine pregnancy (Completed)     Meds ordered this encounter  Medications  . hydrochlorothiazide (HYDRODIURIL) 25 MG tablet    Sig: Take 1 tablet (25 mg total) by mouth daily.    Dispense:  90 tablet    Refill:  2    Order Specific Question:   Supervising Provider    Answer:   Tresa Garter [id number]  . lisinopril (PRINIVIL,ZESTRIL) 10 MG tablet    Sig: Take 1  tablet (10 mg total) by mouth daily.    Dispense:  90 tablet    Refill:  2    Order Specific Question:   Supervising Provider    Answer:   Tresa Garter [id number]  . metFORMIN (GLUCOPHAGE) 500 MG tablet    Sig: Take 1 tablet (500 mg total) by mouth 2 (two) times daily with a meal.    Dispense:  60 tablet    Refill:  2    Order Specific Question:   Supervising Provider    Answer:   Tresa Garter [id number]  . Blood Glucose Monitoring Suppl (TRUE METRIX METER) w/Device KIT    Sig: 1 Device by Does not apply route once.    Dispense:  1 kit    Refill:  0    Order Specific Question:   Supervising Provider    Answer:   Tresa Garter [id number]  . glucose blood (TRUE METRIX BLOOD GLUCOSE TEST) test strip    Sig: Use as instructed    Dispense:  100 each    Refill:  12    Order Specific Question:   Supervising Provider    Answer:   Tresa Garter [[id number]]  . TRUEPLUS LANCETS 28G MISC    Sig: 1 kit by Does not apply route once.    Dispense:  100 each    Refill:  12    Order Specific Question:   Supervising Provider    Answer:   Tresa Garter [id number]  . albuterol (PROVENTIL HFA;VENTOLIN HFA) 108 (90 Base) MCG/ACT inhaler    Sig: Inhale 2 puffs into the lungs every 6 (six) hours as needed for wheezing or shortness of breath.    Dispense:  1 Inhaler    Refill:  2    Order Specific Question:   Supervising Provider    Answer:   Tresa Garter [[id number]]    Follow-up: Return in about 3 months (around 04/22/2016) for Diabetes & Hypertension.   Alfonse Spruce FNP

## 2016-01-24 LAB — MICROALBUMIN / CREATININE URINE RATIO
CREATININE, URINE: 275 mg/dL (ref 20–320)
MICROALB UR: 2.6 mg/dL
Microalb Creat Ratio: 9 mcg/mg creat (ref <30)

## 2016-01-25 ENCOUNTER — Telehealth: Payer: Self-pay | Admitting: *Deleted

## 2016-01-25 NOTE — Telephone Encounter (Signed)
-----   Message from Mandesia R Hairston, FNP sent at 01/25/2016  9:12 AM EST ----- Microalbumin/creatinine ratio level was normal. This tests for protein in your urine that can indicate early signs of kidney damage.  Continue to take your diabetic medications. Start eating a carbohydrate modified diet. Limit your intake of starches, white sugar, white rice and white bread.  Follow up for lipid (cholesterol) screening.    

## 2016-01-25 NOTE — Telephone Encounter (Signed)
-----   Message from Lizbeth BarkMandesia R Hairston, [place]NP sent at 01/25/2016  9:12 AM EST ----- Microalbumin/creatinine ratio level was normal. This tests for protein in your urine that can indicate early signs of kidney damage.  Continue to take your diabetic medications. Start eating a carbohydrate modified diet. Limit your intake of starches, white sugar, white rice and white bread.  Follow up for lipid (cholesterol) screening.

## 2016-01-25 NOTE — Telephone Encounter (Signed)
Mailbox is full and MA in unable to leave a voice message.

## 2016-01-25 NOTE — Telephone Encounter (Signed)
MA informed patient of kidney function being normal. MA also informed patient of needing to implement some healthy diet changes and to continue taking her diabetic medications as prescribed. MA requested patient come in for fasting lipid check. Any questions patient may contact the office at [phone].

## 2016-03-13 ENCOUNTER — Encounter (HOSPITAL_COMMUNITY): Payer: Self-pay | Admitting: Emergency Medicine

## 2016-03-13 ENCOUNTER — Emergency Department (HOSPITAL_COMMUNITY)
Admission: EM | Admit: 2016-03-13 | Discharge: 2016-03-14 | Disposition: A | Discharge status: Home / Self Care | Payer: Self-pay | Attending: Emergency Medicine | Admitting: Emergency Medicine

## 2016-03-13 DIAGNOSIS — J45909 Unspecified asthma, uncomplicated: Secondary | ICD-10-CM | POA: Insufficient documentation

## 2016-03-13 DIAGNOSIS — L03311 Cellulitis of abdominal wall: Secondary | ICD-10-CM | POA: Insufficient documentation

## 2016-03-13 DIAGNOSIS — E119 Type 2 diabetes mellitus without complications: Secondary | ICD-10-CM | POA: Insufficient documentation

## 2016-03-13 DIAGNOSIS — Z7984 Long term (current) use of oral hypoglycemic drugs: Secondary | ICD-10-CM | POA: Insufficient documentation

## 2016-03-13 DIAGNOSIS — Z87891 Personal history of nicotine dependence: Secondary | ICD-10-CM | POA: Insufficient documentation

## 2016-03-13 DIAGNOSIS — Z79899 Other long term (current) drug therapy: Secondary | ICD-10-CM | POA: Insufficient documentation

## 2016-03-13 DIAGNOSIS — I10 Essential (primary) hypertension: Secondary | ICD-10-CM | POA: Insufficient documentation

## 2016-03-13 HISTORY — DX: Unspecified asthma, uncomplicated: J45.909

## 2016-03-13 LAB — CBC
HEMATOCRIT: 38.2 % (ref 36.0–46.0)
Hemoglobin: 12.2 g/dL (ref 12.0–15.0)
MCH: 23.3 pg — AB (ref 26.0–34.0)
MCHC: 31.9 g/dL (ref 30.0–36.0)
MCV: 72.9 fL — AB (ref 78.0–100.0)
PLATELETS: 251 10*3/uL (ref 150–400)
RBC: 5.24 MIL/uL — ABNORMAL HIGH (ref 3.87–5.11)
RDW: 15.5 % (ref 11.5–15.5)
WBC: 4.5 10*3/uL (ref 4.0–10.5)

## 2016-03-13 LAB — COMPREHENSIVE METABOLIC PANEL
ALBUMIN: 4 g/dL (ref 3.5–5.0)
ALT: 26 U/L (ref 14–54)
AST: 25 U/L (ref 15–41)
Alkaline Phosphatase: 55 U/L (ref 38–126)
Anion gap: 5 (ref 5–15)
BUN: 10 mg/dL (ref 6–20)
CHLORIDE: 107 mmol/L (ref 101–111)
CO2: 26 mmol/L (ref 22–32)
CREATININE: 0.79 mg/dL (ref 0.44–1.00)
Calcium: 9.3 mg/dL (ref 8.9–10.3)
GFR calc Af Amer: >60 mL/min (ref >60)
Glucose, Bld: 180 mg/dL — ABNORMAL HIGH (ref 65–99)
POTASSIUM: 3.9 mmol/L (ref 3.5–5.1)
Sodium: 138 mmol/L (ref 135–145)
Total Bilirubin: 1.1 mg/dL (ref 0.3–1.2)
Total Protein: 7.2 g/dL (ref 6.5–8.1)

## 2016-03-13 LAB — I-STAT BETA HCG BLOOD, ED (MC, WL, AP ONLY): I-stat hCG, quantitative: <5 m[IU]/mL (ref <5)

## 2016-03-13 LAB — LIPASE, BLOOD: LIPASE: 19 U/L (ref 11–51)

## 2016-03-13 NOTE — ED Provider Notes (Signed)
[facility]-EMERGENCY DEPT Provider Note   CSN: [id number] Arrival date & time: 03/13/16  2050 By signing my name below, I, Bridgette Habermann, attest that this documentation has been prepared under the direction and in the presence of Fayrene Helper, PA-C. Electronically Signed: Bridgette Habermann, ED Scribe. 03/13/16. 11:32 PM.  History   Chief Complaint Chief Complaint  Patient presents with  . Abdominal Pain    HPI The history is provided by the patient. No language interpreter was used.   HPI Comments: Laura Benson is a 26 y.o. female with h/o asthma, DM, and HTN, who presents to the Emergency Department complaining of swelling and rash to lower abdomen onset 2 weeks ago, worsening the past couple of days. Pt also has associated subjective fever, diarrhea, vomiting, and mild headache. She states that she has abdominal pain that is reproducible with palpation to the area. She has not tried any OTC medications PTA. Pt has h/o DM which she states is hard to control, she does not check her CBG regularly. Denies any new sexual partners, denies h/o STDs.  Pt further denies chills, dysuria, hematuria, polydipsia, or any other associated symptoms.   Past Medical History:  Diagnosis Date  . Asthma   . Depression   . Diabetes mellitus without complication (HCC)   . Hypertension   . Morbid obesity [facility])     Patient Active Problem List   Diagnosis Date Noted  . Diabetes mellitus without complication (HCC) 01/23/2016  . Hypertension 01/23/2016  . Dysmenorrhea 12/25/2013  . Rectal bleeding 10/20/2012  . DEPRESSION 01/05/2009  . BREAST PAIN, BILATERAL 01/05/2009  . INTERTRIGO, CANDIDAL 01/05/2009  . ABSENCE OF MENSTRUATION 09/20/2008  . DELAYED MENSES 08/25/2008  . ASCUS PAP 05/05/2008  . OBESITY, CLASS III 03/26/2006  . HYPERTENSION, BENIGN SYSTEMIC 03/26/2006    Past Surgical History:  Procedure Laterality Date  . ADENOIDECTOMY    . TONSILLECTOMY      OB History    Gravida Para Term Preterm AB  Living   0             SAB TAB Ectopic Multiple Live Births                   Home Medications    Prior to Admission medications   Medication Sig Start Date End Date Taking? Authorizing Provider  albuterol (PROVENTIL HFA;VENTOLIN HFA) 108 (90 Base) MCG/ACT inhaler Inhale 2 puffs into the lungs every 6 (six) hours as needed for wheezing or shortness of breath. 01/23/16  Yes Lizbeth Bark, FNP  hydrochlorothiazide (HYDRODIURIL) 25 MG tablet Take 1 tablet (25 mg total) by mouth daily. 01/23/16  Yes Lizbeth Bark, FNP  ibuprofen (ADVIL,MOTRIN) 200 MG tablet Take 400 mg by mouth every 6 (six) hours as needed for fever, headache, mild pain, moderate pain or cramping.   Yes Historical Provider, MD  lisinopril (PRINIVIL,ZESTRIL) 10 MG tablet Take 1 tablet (10 mg total) by mouth daily. 01/23/16  Yes Lizbeth Bark, FNP  metFORMIN (GLUCOPHAGE) 500 MG tablet Take 1 tablet (500 mg total) by mouth 2 (two) times daily with a meal. 01/23/16  Yes Mandesia R Hairston, FNP  cephALEXin (KEFLEX) 500 MG capsule Take 1 capsule (500 mg total) by mouth 4 (four) times daily. Patient not taking: Reported on 01/23/2016 12/31/15   Elpidio Anis, PA-C  glucose blood (TRUE METRIX BLOOD GLUCOSE TEST) test strip Use as instructed 01/23/16   Lizbeth Bark, FNP    Family History Family History  Problem Relation Age  of Onset  . Hypertension Mother   . Diabetes Mother     Social History Social History  Substance Use Topics  . Smoking status: Former Smoker    Types: Cigarettes  . Smokeless tobacco: Never Used  . Alcohol use No     Allergies   Patient has no known allergies.   Review of Systems Review of Systems  Constitutional: Positive for fever. Negative for chills.  Gastrointestinal: Positive for abdominal distention, abdominal pain, diarrhea and vomiting.  Endocrine: Negative for polydipsia.  Genitourinary: Negative for dysuria and hematuria.  Skin: Positive for rash.    Neurological: Positive for headaches.     Physical Exam Updated Vital Signs BP (!) 181/116 (BP Location: Left Arm)   Pulse 105   Temp 98.9 F (37.2 C) (Oral)   Resp 20   SpO2 96%   Physical Exam  Constitutional: She appears well-developed and well-nourished.  Morbidly obese female nontoxic in appearance  HENT:  Head: Normocephalic.  Eyes: Conjunctivae are normal.  Cardiovascular: Normal rate, regular rhythm and normal heart sounds.  Exam reveals no gallop and no friction rub.   No murmur heard. Pulmonary/Chest: Effort normal and breath sounds normal. No respiratory distress. She has no wheezes. She has no rales.  Abdominal: Soft. Bowel sounds are normal. She exhibits no distension. There is tenderness.  Musculoskeletal: Normal range of motion.  Neurological: She is alert.  Skin: Skin is warm and dry. Rash (Lower panus of abdomen: Moderate amount of faint skin erythema with warmth and mild induration noted to the anterior lower panus nontender to palpation with without any obvious abscess.) noted.  Psychiatric: She has a normal mood and affect. Her behavior is normal.  Nursing note and vitals reviewed.  ED Treatments / Results  DIAGNOSTIC STUDIES: Oxygen Saturation is 96% on RA, adequate by my interpretation.    COORDINATION OF CARE: 11:30 PM Discussed treatment plan with pt at bedside and pt agreed to plan.  Labs (all labs ordered are listed, but only abnormal results are displayed) Labs Reviewed  COMPREHENSIVE METABOLIC PANEL - Abnormal; Notable for the following:       Result Value   Glucose, Bld 180 (*)    All other components within normal limits  CBC - Abnormal; Notable for the following:    RBC 5.24 (*)    MCV 72.9 (*)    MCH 23.3 (*)    All other components within normal limits  LIPASE, BLOOD  URINALYSIS, ROUTINE W REFLEX MICROSCOPIC  I-STAT BETA HCG BLOOD, ED ([facility], [facility], [facility] ONLY)    EKG  EKG Interpretation None       Radiology No results  found.  Procedures Procedures (including critical care time)  Medications Ordered in ED Medications - No data to display   Initial Impression / Assessment and Plan / ED Course  I have reviewed the triage vital signs and the nursing notes.  Pertinent labs & imaging results that were available during my care of the patient were reviewed by me and considered in my medical decision making (see chart for details).     BP 168/89 (BP Location: Right Arm) Comment (BP Location): Lower arm with large cuff   Pulse 82   Temp 98.7 F (37.1 C) (Oral)   Resp 20   SpO2 97%  The patient was noted to be hypertensive today in the emergency department. I have spoken with the patient regarding hypertension and the need for improved management. I instructed the patient to followup with the Primary  care doctor within 4 days to improve the management of the patient's hypertension. I also counseled the patient regarding the signs and symptoms which would require an emergent visit to an emergency department for hypertensive urgency and/or hypertensive emergency. The patient understood the need for improved hypertensive management.   Final Clinical Impressions(s) / ED Diagnoses   Final diagnoses:  Cellulitis of abdominal wall    New Prescriptions New Prescriptions   SULFAMETHOXAZOLE-TRIMETHOPRIM (BACTRIM DS,SEPTRA DS) 800-160 MG TABLET    Take 1 tablet by mouth 2 (two) times daily.   I personally performed the services described in this documentation, which was scribed in my presence. The recorded information has been reviewed and is accurate.     12:02 AM This is a morbidly obese female with history of diabetes here with abdominal wall discomfort. It appears she is developing cutaneous cellulitis to the lower panus without any obvious abscess. Doubt syphilis or meningococcal rash.  Patient will benefit from antibiotic. Her blood pressure is elevated at 181/116. Does have a mild headache but no  meningismal sign concerning for meningitis. Encourage patient to have it rechecked by her PCP promptly. I also encouraged patient to take her medication as prescribed. Today CBG is 180 without any signs of DKA. Her pregnancy test is normal. Normal white count. Encourage patient to return in 48 hours after taking abx for reassessment of her cellulitis.    Fayrene HelperBowie Cadince Hilscher, PA-C 03/14/16 0112    April Palumbo, MD 03/14/16 [id number]

## 2016-03-13 NOTE — ED Triage Notes (Signed)
Pt reports abdominal swelling and rash to lower abdomen. Reports pain when touching for past 2 weeks. No vomiting or diarrhea. Reports minor headache.

## 2016-03-14 MED ORDER — CEPHALEXIN 500 MG PO CAPS: 1000.0000 mg | ORAL_CAPSULE | Freq: Two times a day (BID) | ORAL | 0 refills | Status: DC

## 2016-03-14 MED ORDER — SULFAMETHOXAZOLE-TRIMETHOPRIM 800-160 MG PO TABS: 1.0000 | ORAL_TABLET | Freq: Two times a day (BID) | ORAL | 0 refills | Status: AC

## 2016-03-14 NOTE — Discharge Instructions (Signed)
Your abdominal pain is likely due to skin infection (cellulitis).  Please take antibiotics as prescribed for the full duration.  Follow up with your doctor or return in 48 hrs for recheck if no improvement.

## 2016-09-12 ENCOUNTER — Ambulatory Visit: Payer: Self-pay | Admitting: Family Medicine

## 2016-10-01 ENCOUNTER — Encounter: Payer: Self-pay | Admitting: Family Medicine

## 2016-10-01 ENCOUNTER — Ambulatory Visit (INDEPENDENT_AMBULATORY_CARE_PROVIDER_SITE_OTHER): Payer: Self-pay | Admitting: Family Medicine

## 2016-10-01 VITALS — BP 132/100 | HR 90 | Temp 97.9°F | Ht 65.5 in | Wt >= 6400 oz

## 2016-10-01 DIAGNOSIS — R0683 Snoring: Secondary | ICD-10-CM

## 2016-10-01 DIAGNOSIS — R5383 Other fatigue: Secondary | ICD-10-CM | POA: Diagnosis not present

## 2016-10-01 DIAGNOSIS — J45909 Unspecified asthma, uncomplicated: Secondary | ICD-10-CM

## 2016-10-01 DIAGNOSIS — I10 Essential (primary) hypertension: Secondary | ICD-10-CM | POA: Diagnosis not present

## 2016-10-01 DIAGNOSIS — Z6841 Body Mass Index (BMI) 40.0 and over, adult: Secondary | ICD-10-CM

## 2016-10-01 DIAGNOSIS — E119 Type 2 diabetes mellitus without complications: Secondary | ICD-10-CM

## 2016-10-01 DIAGNOSIS — Z7689 Persons encountering health services in other specified circumstances: Secondary | ICD-10-CM | POA: Diagnosis not present

## 2016-10-01 LAB — COMPREHENSIVE METABOLIC PANEL
ALBUMIN: 4.1 g/dL (ref 3.5–5.2)
ALK PHOS: 65 U/L (ref 39–117)
ALT: 16 U/L (ref 0–35)
AST: 13 U/L (ref 0–37)
BUN: 7 mg/dL (ref 6–23)
CHLORIDE: 101 meq/L (ref 96–112)
CO2: 26 mEq/L (ref 19–32)
Calcium: 9.4 mg/dL (ref 8.4–10.5)
Creatinine, Ser: 0.7 mg/dL (ref 0.40–1.20)
GFR: 129.31 mL/min (ref >60.00)
Glucose, Bld: 272 mg/dL — ABNORMAL HIGH (ref 70–99)
POTASSIUM: 4.3 meq/L (ref 3.5–5.1)
Sodium: 136 mEq/L (ref 135–145)
TOTAL PROTEIN: 7 g/dL (ref 6.0–8.3)
Total Bilirubin: 0.6 mg/dL (ref 0.2–1.2)

## 2016-10-01 LAB — TSH: TSH: 1.82 u[IU]/mL (ref 0.35–4.50)

## 2016-10-01 LAB — LIPID PANEL
CHOLESTEROL: 192 mg/dL (ref 0–200)
HDL: 38.3 mg/dL — ABNORMAL LOW (ref >39.00)
LDL Cholesterol: 133 mg/dL — ABNORMAL HIGH (ref 0–99)
NonHDL: 153.38
TRIGLYCERIDES: 102 mg/dL (ref 0.0–149.0)
Total CHOL/HDL Ratio: 5
VLDL: 20.4 mg/dL (ref 0.0–40.0)

## 2016-10-01 LAB — CBC WITH DIFFERENTIAL/PLATELET
BASOS PCT: 0.6 % (ref 0.0–3.0)
Basophils Absolute: 0 10*3/uL (ref 0.0–0.1)
EOS ABS: 0.1 10*3/uL (ref 0.0–0.7)
Eosinophils Relative: 1.3 % (ref 0.0–5.0)
HEMATOCRIT: 44.8 % (ref 36.0–46.0)
Hemoglobin: 14.1 g/dL (ref 12.0–15.0)
Lymphocytes Relative: 30.9 % (ref 12.0–46.0)
Lymphs Abs: 1.6 10*3/uL (ref 0.7–4.0)
MCHC: 31.4 g/dL (ref 30.0–36.0)
MCV: 75.7 fl — ABNORMAL LOW (ref 78.0–100.0)
MONOS PCT: 8.7 % (ref 3.0–12.0)
Monocytes Absolute: 0.5 10*3/uL (ref 0.1–1.0)
Neutro Abs: 3.1 10*3/uL (ref 1.4–7.7)
Neutrophils Relative %: 58.5 % (ref 43.0–77.0)
PLATELETS: 282 10*3/uL (ref 150.0–400.0)
RBC: 5.92 Mil/uL — ABNORMAL HIGH (ref 3.87–5.11)
RDW: 15.2 % (ref 11.5–15.5)
WBC: 5.3 10*3/uL (ref 4.0–10.5)

## 2016-10-01 LAB — HEMOGLOBIN A1C: HEMOGLOBIN A1C: 10.3 % — AB (ref 4.6–6.5)

## 2016-10-01 LAB — HCG, QUANTITATIVE, PREGNANCY

## 2016-10-01 LAB — T4, FREE: FREE T4: 1.06 ng/dL (ref 0.60–1.60)

## 2016-10-01 MED ORDER — HYDROCHLOROTHIAZIDE 25 MG PO TABS: 25.0000 mg | ORAL_TABLET | Freq: Every day | ORAL | 2 refills | Status: DC

## 2016-10-01 MED ORDER — METFORMIN HCL 500 MG PO TABS: 500.0000 mg | ORAL_TABLET | Freq: Two times a day (BID) | ORAL | 2 refills | Status: DC

## 2016-10-01 NOTE — Progress Notes (Signed)
Patient presents to clinic today to establish care.  SUBJECTIVE: PMH: Pt is a 26 yo AAF, with pmh sig for asthma, DM II, HTN, here to establish care.  She was formerly seen by [facility] on [address]  Pt endorses noncompliance with all meds, but states she is here to do better/take control of her health.  Asthma:  Pt endorses having an albuterol inhaler she uses when wheezing.  Wheezing noted when walking long distances or when "sick".  Pt states she may use albuterol 5 x per wk, but also attributes her SOB to her weight.  DM II:  Pt was formerly on Metformin 500 mg BID.  She endorsed diarrhea when taking the med.  Pt states she has never checked her fsbs at home 2/2 not wanting to have to stick herself/not having all the supplies.  HTN:  H/o HTN.  Was on HCTZ, but is not taking.  Does not take bp at home.  Tired:   Pt endorses snoring and apnea while sleeping.  States had a sleep study done when she was a child.  She then had a tonsillectomy and adenoidectomy.  She endorses feeling tired all the time and having difficulty sleeping.  Pt works 3rd shift 2/2 trouble sleeping.  Weight:  Pt concerned about her weight.  States was scared she would be 500lbs today.  Pt does endorse loosing 15lbs after decreasing sodas and not eating after a certain time.  Allergies: NKDA  PSurgHx: -tonsillectomy and adenoidectomy  Social Hx: -Pt is a single female who lives with her dog prince.  Pt works 3 jobs.  She is employed by [employer] and [employer].  Pt is a former smoker.  She quit 8 months ago.  She started smoking at 19 when stressed.  She does not drink EtOH or use drugs.  FMHx: Mom- HTN, DM, 2 back surgeries Dad-Colon Ca Older sister: DM, asthma Younger sister: possible DM PGM: DM, Parkinson's dz.  Health Maintenance: Vision -- needs vision check Immunizations -- declines flu shot    Past Medical History:  Diagnosis Date  . Asthma   .  Depression   . Diabetes mellitus without complication (HCC)   . Hypertension   . Morbid obesity (HCC)   . Thyroid disease     Past Surgical History:  Procedure Laterality Date  . ADENOIDECTOMY    . TONSILLECTOMY      Current Outpatient Prescriptions on File Prior to Visit  Medication Sig Dispense Refill  . albuterol (PROVENTIL HFA;VENTOLIN HFA) 108 (90 Base) MCG/ACT inhaler Inhale 2 puffs into the lungs every 6 (six) hours as needed for wheezing or shortness of breath. 1 Inhaler 2  . ibuprofen (ADVIL,MOTRIN) 200 MG tablet Take 400 mg by mouth every 6 (six) hours as needed for fever, headache, mild pain, moderate pain or cramping.    Marland Kitchen glucose blood (TRUE METRIX BLOOD GLUCOSE TEST) test strip Use as instructed (Patient not taking: Reported on 10/01/2016) 100 each 12  . lisinopril (PRINIVIL,ZESTRIL) 10 MG tablet Take 1 tablet (10 mg total) by mouth daily. (Patient not taking: Reported on 10/01/2016) 90 tablet 2   No current facility-administered medications on file prior to visit.     No Known Allergies  Family History  Problem Relation Age of Onset  . Hypertension Mother   . Diabetes Mother   . Colon cancer Father   . Hypertension Maternal Grandmother   . Hypertension Paternal Grandmother   .  Diabetes Paternal Grandmother   . Breast cancer Cousin     Social History   Social History  . Marital status: Single    Spouse name: N/A  . Number of children: 0  . Years of education: N/A   Occupational History  .  [employer]   Social History Main Topics  . Smoking status: Former Smoker    Types: Cigarettes  . Smokeless tobacco: Never Used  . Alcohol use No  . Drug use: No  . Sexual activity: Yes    Birth control/ protection: None     Comment: No form of birth control used.   Other Topics Concern  . Not on file   Social History Narrative  . No narrative on file    ROS  General: Denies fever, chills, night sweats, changes in appetite  +wt gain HEENT: Denies headaches, ear  pain, changes in vision, rhinorrhea, sore throat CV: Denies CP, palpitations, orthopnea  +SOB Pulm: Denies cough     +SOB, wheezing, snoring GI: Denies abdominal pain, nausea, vomiting, diarrhea, constipation GU: Denies dysuria, hematuria, frequency, vaginal discharge Msk: Denies muscle cramps, joint pains Neuro: Denies weakness, numbness, tingling Skin: Denies rashes, bruising Psych: Denies depression, anxiety, hallucinations   BP (!) 132/100 (BP Location: Right Arm, Patient Position: Sitting, Cuff Size: Large) Comment: Forearm  Pulse 90   Temp 97.9 F (36.6 C) (Oral)   Ht 5' 5.5" (1.664 m)   Wt (!) 484 lb 6.4 oz (219.7 kg)   LMP 08/19/2016 (Exact Date)   SpO2 97%   BMI 79.38 kg/m   Physical Exam  Gen. Pleasant, well developed, well-nourished, in NAD, obese HEENT - [place]/AT, PERRL, EOMI, no scleral icterus, no nasal drainage, pharynx without erythema or exudate. Neck: No JVD, no thyromegaly, no carotid bruits Lungs: no accessory muscle use, posterior lower lung fields difficult to assess 2/2 body habitus,  otherwise CTAB, no wheezes, rales or rhonchi Cardiovascular: RRR, No r/g/m, no peripheral edema Abdomen: BS present, soft, nontender,nondistended, no hepatosplenomegaly Musculoskeletal: No deformities, moves all four extremities, no cyanosis or clubbing, normal tone Neuro:  A&Ox3, CN II-XII intact, normal gait Skin:  Warm, dry, intact, no lesions, tattoos on chest Psych: normal affect, mood appropriate  No results found for this or any previous visit (from the past 2160 hour(s)).  Assessment/Plan: Diabetes mellitus without complication (HCC)  -noncompliant - Plan: Hemoglobin A1c, Protein / Creatinine Ratio, Urine, Ambulatory referral to diabetic education, metFORMIN (GLUCOPHAGE) 500 MG tablet -lifestyle modifications encouraged. -F/U in 2 wks  Essential hypertension  - Plan: Comprehensive metabolic panel, hydrochlorothiazide (HYDRODIURIL) 25 MG tablet  BMI 70 and over,  adult (HCC)  -Pt congratulated on 15 lb wt loss. -Encouraged to increase physical activity and make dietary changes. -will refer to nutrition - Plan: CBC with Differential/Platelet, HCG, Quant, Pregnancy, Lipid panel, TSH, T4, Free  Snoring -STOP BANG score: 6= High risk of OSA - Plan: Nocturnal polysomnography (NPSG)   Uncomplicated asthma, unspecified asthma severity, unspecified whether persistent -Discussed some sx of SOB likely related to wt/deconditioning. -continue Albuterol inhaler prn. -consider PFTs in future.  Encounter to establish care with new doctor -pt to sign records release  Lethargy  - Plan: Vitamin D, 25-hydroxy, sleep study

## 2016-10-01 NOTE — Patient Instructions (Addendum)
Diabetes Mellitus and Exercise Exercising regularly is important for your overall health, especially when you have diabetes (diabetes mellitus). Exercising is not only about losing weight. It has many health benefits, such as increasing muscle strength and bone density and reducing body fat and stress. This leads to improved fitness, flexibility, and endurance, all of which result in better overall health. Exercise has additional benefits for people with diabetes, including:  Reducing appetite.  Helping to lower and control blood glucose.  Lowering blood pressure.  Helping to control amounts of fatty substances (lipids) in the blood, such as cholesterol and triglycerides.  Helping the body to respond better to insulin (improving insulin sensitivity).  Reducing how much insulin the body needs.  Decreasing the risk for heart disease by: ? Lowering cholesterol and triglyceride levels. ? Increasing the levels of good cholesterol. ? Lowering blood glucose levels.  What is my activity plan? Your health care provider or certified diabetes educator can help you make a plan for the type and frequency of exercise (activity plan) that works for you. Make sure that you:  Do at least 150 minutes of moderate-intensity or vigorous-intensity exercise each week. This could be brisk walking, biking, or water aerobics. ? Do stretching and strength exercises, such as yoga or weightlifting, at least 2 times a week. ? Spread out your activity over at least 3 days of the week.  Get some form of physical activity every day. ? Do not go more than 2 days in a row without some kind of physical activity. ? Avoid being inactive for more than 90 minutes at a time. Take frequent breaks to walk or stretch.  Choose a type of exercise or activity that you enjoy, and set realistic goals.  Start slowly, and gradually increase the intensity of your exercise over time.  What do I need to know about managing my  diabetes?  Check your blood glucose before and after exercising. ? If your blood glucose is higher than 240 mg/dL (13.3 mmol/L) before you exercise, check your urine for ketones. If you have ketones in your urine, do not exercise until your blood glucose returns to normal.  Know the symptoms of low blood glucose (hypoglycemia) and how to treat it. Your risk for hypoglycemia increases during and after exercise. Common symptoms of hypoglycemia can include: ? Hunger. ? Anxiety. ? Sweating and feeling clammy. ? Confusion. ? Dizziness or feeling light-headed. ? Increased heart rate or palpitations. ? Blurry vision. ? Tingling or numbness around the mouth, lips, or tongue. ? Tremors or shakes. ? Irritability.  Keep a rapid-acting carbohydrate snack available before, during, and after exercise to help prevent or treat hypoglycemia.  Avoid injecting insulin into areas of the body that are going to be exercised. For example, avoid injecting insulin into: ? The arms, when playing tennis. ? The legs, when jogging.  Keep records of your exercise habits. Doing this can help you and your health care provider adjust your diabetes management plan as needed. Write down: ? Food that you eat before and after you exercise. ? Blood glucose levels before and after you exercise. ? The type and amount of exercise you have done. ? When your insulin is expected to peak, if you use insulin. Avoid exercising at times when your insulin is peaking.  When you start a new exercise or activity, work with your health care provider to make sure the activity is safe for you, and to adjust your insulin, medicines, or food intake as needed.    Drink plenty of water while you exercise to prevent dehydration or heat stroke. Drink enough fluid to keep your urine clear or pale yellow. This information is not intended to replace advice given to you by your health care provider. Make sure you discuss any questions you have with  your health care provider. Document Released: 04/05/2003 Document Revised: 08/03/2015 Document Reviewed: 06/25/2015 Elsevier Interactive Patient Education  2018 [employer]  Hypertension Hypertension, commonly called high blood pressure, is when the force of blood pumping through the arteries is too strong. The arteries are the blood vessels that carry blood from the heart throughout the body. Hypertension forces the heart to work harder to pump blood and may cause arteries to become narrow or stiff. Having untreated or uncontrolled hypertension can cause heart attacks, strokes, kidney disease, and other problems. A blood pressure reading consists of a higher number over a lower number. Ideally, your blood pressure should be below 120/80. The first ("top") number is called the systolic pressure. It is a measure of the pressure in your arteries as your heart beats. The second ("bottom") number is called the diastolic pressure. It is a measure of the pressure in your arteries as the heart relaxes. What are the causes? The cause of this condition is not known. What increases the risk? Some risk factors for high blood pressure are under your control. Others are not. Factors you can change  Smoking.  Having type 2 diabetes mellitus, high cholesterol, or both.  Not getting enough exercise or physical activity.  Being overweight.  Having too much fat, sugar, calories, or salt (sodium) in your diet.  Drinking too much alcohol. Factors that are difficult or impossible to change  Having chronic kidney disease.  Having a family history of high blood pressure.  Age. Risk increases with age.  Race. You may be at higher risk if you are African-American.  Gender. Men are at higher risk than women before age 27. After age 27, women are at higher risk than men.  Having obstructive sleep apnea.  Stress. What are the signs or symptoms? Extremely high blood pressure (hypertensive crisis) may  cause:  Headache.  Anxiety.  Shortness of breath.  Nosebleed.  Nausea and vomiting.  Severe chest pain.  Jerky movements you cannot control (seizures).  How is this diagnosed? This condition is diagnosed by measuring your blood pressure while you are seated, with your arm resting on a surface. The cuff of the blood pressure monitor will be placed directly against the skin of your upper arm at the level of your heart. It should be measured at least twice using the same arm. Certain conditions can cause a difference in blood pressure between your right and left arms. Certain factors can cause blood pressure readings to be lower or higher than normal (elevated) for a short period of time:  When your blood pressure is higher when you are in a health care provider's office than when you are at home, this is called white coat hypertension. Most people with this condition do not need medicines.  When your blood pressure is higher at home than when you are in a health care provider's office, this is called masked hypertension. Most people with this condition may need medicines to control blood pressure.  If you have a high blood pressure reading during one visit or you have normal blood pressure with other risk factors:  You may be asked to return on a different day to have your blood pressure checked  again.  You may be asked to monitor your blood pressure at home for 1 week or longer.  If you are diagnosed with hypertension, you may have other blood or imaging tests to help your health care provider understand your overall risk for other conditions. How is this treated? This condition is treated by making healthy lifestyle changes, such as eating healthy foods, exercising more, and reducing your alcohol intake. Your health care provider may prescribe medicine if lifestyle changes are not enough to get your blood pressure under control, and if:  Your systolic blood pressure is above  130.  Your diastolic blood pressure is above 80.  Your personal target blood pressure may vary depending on your medical conditions, your age, and other factors. Follow these instructions at home: Eating and drinking  Eat a diet that is high in fiber and potassium, and low in sodium, added sugar, and fat. An example eating plan is called the DASH (Dietary Approaches to Stop Hypertension) diet. To eat this way: ? Eat plenty of fresh fruits and vegetables. Try to fill half of your plate at each meal with fruits and vegetables. ? Eat whole grains, such as whole wheat pasta, brown rice, or whole grain bread. Fill about one quarter of your plate with whole grains. ? Eat or drink low-fat dairy products, such as skim milk or low-fat yogurt. ? Avoid fatty cuts of meat, processed or cured meats, and poultry with skin. Fill about one quarter of your plate with lean proteins, such as fish, chicken without skin, beans, eggs, and tofu. ? Avoid premade and processed foods. These tend to be higher in sodium, added sugar, and fat.  Reduce your daily sodium intake. Most people with hypertension should eat less than 1,500 mg of sodium a day.  Limit alcohol intake to no more than 1 drink a day for nonpregnant women and 2 drinks a day for men. One drink equals 12 oz of beer, 5 oz of wine, or 1 oz of hard liquor. Lifestyle  Work with your health care provider to maintain a healthy body weight or to lose weight. Ask what an ideal weight is for you.  Get at least 30 minutes of exercise that causes your heart to beat faster (aerobic exercise) most days of the week. Activities may include walking, swimming, or biking.  Include exercise to strengthen your muscles (resistance exercise), such as pilates or lifting weights, as part of your weekly exercise routine. Try to do these types of exercises for 30 minutes at least 3 days a week.  Do not use any products that contain nicotine or tobacco, such as cigarettes and  e-cigarettes. If you need help quitting, ask your health care provider.  Monitor your blood pressure at home as told by your health care provider.  Keep all follow-up visits as told by your health care provider. This is important. Medicines  Take over-the-counter and prescription medicines only as told by your health care provider. Follow directions carefully. Blood pressure medicines must be taken as prescribed.  Do not skip doses of blood pressure medicine. Doing this puts you at risk for problems and can make the medicine less effective.  Ask your health care provider about side effects or reactions to medicines that you should watch for. Contact a health care provider if:  You think you are having a reaction to a medicine you are taking.  You have headaches that keep coming back (recurring).  You feel dizzy.  You have swelling in your  ankles.  You have trouble with your vision. Get help right away if:  You develop a severe headache or confusion.  You have unusual weakness or numbness.  You feel faint.  You have severe pain in your chest or abdomen.  You vomit repeatedly.  You have trouble breathing. Summary  Hypertension is when the force of blood pumping through your arteries is too strong. If this condition is not controlled, it may put you at risk for serious complications.  Your personal target blood pressure may vary depending on your medical conditions, your age, and other factors. For most people, a normal blood pressure is less than 120/80.  Hypertension is treated with lifestyle changes, medicines, or a combination of both. Lifestyle changes include weight loss, eating a healthy, low-sodium diet, exercising more, and limiting alcohol. This information is not intended to replace advice given to you by your health care provider. Make sure you discuss any questions you have with your health care provider. Document Released: 01/13/2005 Document Revised: 12/12/2015  Document Reviewed: 12/12/2015 Elsevier Interactive Patient Education  2018 [employer] Inc.  Sleep Apnea Sleep apnea is a condition that affects breathing. People with sleep apnea have moments during sleep when their breathing pauses briefly or gets shallow. Sleep apnea can cause these symptoms:  Trouble staying asleep.  Sleepiness or tiredness during the day.  Irritability.  Loud snoring.  Morning headaches.  Trouble concentrating.  Forgetting things.  Less interest in sex.  Being sleepy for no reason.  Mood swings.  Personality changes.  Depression.  Waking up a lot during the night to pee (urinate).  Dry mouth.  Sore throat.  Follow these instructions at home:  Make any changes in your routine that your doctor recommends.  Eat a healthy, well-balanced diet.  Take over-the-counter and prescription medicines only as told by your doctor.  Avoid using alcohol, calming medicines (sedatives), and narcotic medicines.  Take steps to lose weight if you are overweight.  If you were given a machine (device) to use while you sleep, use it only as told by your doctor.  Do not use any tobacco products, such as cigarettes, chewing tobacco, and e-cigarettes. If you need help quitting, ask your doctor.  Keep all follow-up visits as told by your doctor. This is important. Contact a doctor if:  The machine that you were given to use during sleep is uncomfortable or does not seem to be working.  Your symptoms do not get better.  Your symptoms get worse. Get help right away if:  Your chest hurts.  You have trouble breathing in enough air (shortness of breath).  You have an uncomfortable feeling in your back, arms, or stomach.  You have trouble talking.  One side of your body feels weak.  A part of your face is hanging down (drooping). These symptoms may be an emergency. Do not wait to see if the symptoms will go away. Get medical help right away. Call your local  emergency services (911 in the [place] Do not drive yourself to the hospital. This information is not intended to replace advice given to you by your health care provider. Make sure you discuss any questions you have with your health care provider. Document Released: 10/23/2007 Document Revised: 09/09/2015 Document Reviewed: 10/23/2014 Elsevier Interactive Patient Education  [employer].

## 2016-10-02 ENCOUNTER — Telehealth: Payer: Self-pay | Admitting: Family Medicine

## 2016-10-02 LAB — PROTEIN / CREATININE RATIO, URINE
Creatinine, Urine: 201 mg/dL (ref 20–320)
Protein/Creat Ratio: 134 mg/g creat (ref 21–161)
Total Protein, Urine: 27 mg/dL — ABNORMAL HIGH (ref 5–24)

## 2016-10-02 LAB — EXTRA URINE SPECIMEN

## 2016-10-02 NOTE — Telephone Encounter (Signed)
Pt would like blood work results °

## 2016-10-13 ENCOUNTER — Encounter: Payer: Self-pay | Admitting: Family Medicine

## 2016-10-15 ENCOUNTER — Ambulatory Visit (INDEPENDENT_AMBULATORY_CARE_PROVIDER_SITE_OTHER): Payer: 59 | Admitting: Family Medicine

## 2016-10-15 ENCOUNTER — Encounter: Payer: Self-pay | Admitting: Family Medicine

## 2016-10-15 VITALS — BP 140/100 | HR 109 | Temp 98.6°F | Wt >= 6400 oz

## 2016-10-15 DIAGNOSIS — E119 Type 2 diabetes mellitus without complications: Secondary | ICD-10-CM

## 2016-10-15 DIAGNOSIS — F321 Major depressive disorder, single episode, moderate: Secondary | ICD-10-CM | POA: Diagnosis not present

## 2016-10-15 DIAGNOSIS — I10 Essential (primary) hypertension: Secondary | ICD-10-CM | POA: Diagnosis not present

## 2016-10-15 LAB — GLUCOSE, POCT (MANUAL RESULT ENTRY): POC GLUCOSE: 233 mg/dL — AB (ref 70–99)

## 2016-10-15 MED ORDER — PEN NEEDLES 31G X 8 MM MISC: 2 refills | Status: DC

## 2016-10-15 MED ORDER — BLOOD GLUCOSE MONITOR KIT: PACK | 0 refills | Status: DC

## 2016-10-15 MED ORDER — FLUOXETINE HCL 20 MG PO TABS: 20.0000 mg | ORAL_TABLET | Freq: Every morning | ORAL | 3 refills | Status: DC

## 2016-10-15 MED ORDER — DULAGLUTIDE 0.75 MG/0.5ML ~~LOC~~ SOAJ: 0.7500 mg | SUBCUTANEOUS | 5 refills | Status: DC

## 2016-10-15 MED ORDER — BLOOD PRESSURE MONITOR/L CUFF MISC: 0 refills | Status: DC

## 2016-10-15 NOTE — Progress Notes (Signed)
Subjective:    Patient ID: Laura Benson, female    DOB: 1990-10-01, 26 y.o.   MRN: [id number]  Chief Complaint  Patient presents with  . Follow-up    HPI Patient was seen today for f/u on DM and HTN.  DM: -Patient has not been taking metformin 2/2 diarrhea. -Last hemoglobin A1c 10.3. -Patient has not been checking FSBS.  States does not know where her meter is. -Patient had a [employer]. -Patient states she got a Physiological scientist. To start working out later this week.  HTN: -Patient has not been taking hydrochlorothiazide. -Patient states after last appointment felt bad and did not want to take anything. -Does not check blood pressure at home.  Depression: -Patient states had issues in teenage years with depression. -Was on Wellbutrin in the past. Not sure it helped. -Patient states has been feeling down, not wanting to be around people. -At times patient has had suicidal ideations. Had a plan to take all of her medications at work. -Patient denies current SI/HI. States does not feel like she needs to be hospitalized. -Patient was working 3 jobs but quit 2 of them.  R leg soreness: -pt states she bumped into something at work -small bruise on R Lateral calf -Tried Ibuprofen for discomfort   Outpatient Medications Prior to Visit  Medication Sig Dispense Refill  . albuterol (PROVENTIL HFA;VENTOLIN HFA) 108 (90 Base) MCG/ACT inhaler Inhale 2 puffs into the lungs every 6 (six) hours as needed for wheezing or shortness of breath. 1 Inhaler 2  . ibuprofen (ADVIL,MOTRIN) 200 MG tablet Take 400 mg by mouth every 6 (six) hours as needed for fever, headache, mild pain, moderate pain or cramping.    Marland Kitchen glucose blood (TRUE METRIX BLOOD GLUCOSE TEST) test strip Use as instructed (Patient not taking: Reported on 10/01/2016) 100 each 12  . hydrochlorothiazide (HYDRODIURIL) 25 MG tablet Take 1 tablet (25 mg total) by mouth daily. (Patient not taking: Reported on 10/15/2016) 90 tablet 2   . lisinopril (PRINIVIL,ZESTRIL) 10 MG tablet Take 1 tablet (10 mg total) by mouth daily. (Patient not taking: Reported on 10/01/2016) 90 tablet 2  . metFORMIN (GLUCOPHAGE) 500 MG tablet Take 1 tablet (500 mg total) by mouth 2 (two) times daily with a meal. (Patient not taking: Reported on 10/15/2016) 60 tablet 2   No facility-administered medications prior to visit.     No Known Allergies  ROS General: Denies fever, chills, night sweats, changes in weight, changes in appetite HEENT: Denies headaches, ear pain, changes in vision, rhinorrhea, sore throat CV: Denies CP, palpitations, SOB, orthopnea Pulm: Denies SOB, cough, wheezing GI: Denies abdominal pain, nausea, vomiting, diarrhea, constipation GU: Denies dysuria, hematuria, frequency, vaginal discharge Msk: Denies muscle cramps, joint pains Neuro: Denies weakness, numbness, tingling Skin: Denies rashes, bruising Psych: Denies anxiety, hallucinations + depression     Objective:    Blood pressure (!) 140/100, pulse (!) 109, temperature 98.6 F (37 C), temperature source Oral, weight (!) 496 lb 11.2 oz (225.3 kg).   Gen. Pleasant, well-nourished, obese, in no distress, normal affect. HEENT: [place]/AT, face symmetric, no scleral icterus, PERRLA Lungs: no accessory muscle use, CTAB, no wheezes or rales Cardiovascular: RRR, no m/r/g, no peripheral edema Abdomen: soft and non-tender, no hepatosplenomegaly, BS normal Neuro:  A&Ox3, CN II-XII intact, normal gait Skin:  Warm, no lesions/ rash.  Faint bruise on R lateral calf. Psych: Decreased mood, normal affect.   Lab Results  Component Value Date   WBC 5.3 10/01/2016   HGB  14.1 10/01/2016   HCT 44.8 10/01/2016   PLT 282.0 10/01/2016   GLUCOSE 272 (H) 10/01/2016   CHOL 192 10/01/2016   TRIG 102.0 10/01/2016   HDL 38.30 (L) 10/01/2016   LDLCALC 133 (H) 10/01/2016   ALT 16 10/01/2016   AST 13 10/01/2016   NA 136 10/01/2016   K 4.3 10/01/2016   CL 101 10/01/2016   CREATININE  0.70 10/01/2016   BUN 7 10/01/2016   CO2 26 10/01/2016   TSH 1.82 10/01/2016   INR 1.0 10/11/2008   HGBA1C 10.3 (H) 10/01/2016   MICROALBUR 2.6 01/23/2016    Assessment/Plan:  Diabetes mellitus without complication ([place])  -Patient to attempt metformin 500 mg daily -Emphasized diet, exercise, weight loss -Patient to check fsbs daily, more frequently for hyper or hypoglycemia -Discussed eliminating sodas. -Patient will to try trulicity.  Will likely need additional medication. Patient understanding. - Plan: POCT glucose (manual entry), blood glucose meter kit and supplies KIT, Dulaglutide (TRULICITY) 4.58 PF/2.9WK SOPN -Follow-up in 2 weeks.  Essential hypertension  -Patient advised to start hydrochlorothiazide. -Patient to check blood pressure at home, provided with Rx for monitor - Plan: Blood Pressure Monitoring (BLOOD PRESSURE MONITOR/L CUFF) MISC  Depression, major, single episode, moderate (HCC) -PHQ9 score 20 -Discussed starting counseling -Return to clinic precautions Benson.  Advised medication may take several weeks before effect seen. Patient expressed understanding.  - Plan: FLUoxetine (PROZAC) 20 MG tablet

## 2016-10-16 ENCOUNTER — Encounter: Payer: Self-pay | Admitting: Family Medicine

## 2016-11-05 ENCOUNTER — Other Ambulatory Visit: Payer: Self-pay | Admitting: Emergency Medicine

## 2016-11-05 ENCOUNTER — Ambulatory Visit: Payer: 59 | Admitting: Registered"

## 2016-11-05 MED ORDER — ONETOUCH DELICA LANCETS FINE MISC: 11 refills | Status: DC

## 2016-11-26 ENCOUNTER — Ambulatory Visit (HOSPITAL_BASED_OUTPATIENT_CLINIC_OR_DEPARTMENT_OTHER): Payer: 59 | Attending: Family Medicine | Admitting: Internal Medicine

## 2016-11-26 VITALS — Ht 65.5 in | Wt >= 6400 oz

## 2016-11-26 DIAGNOSIS — R0683 Snoring: Secondary | ICD-10-CM

## 2016-11-26 DIAGNOSIS — G4733 Obstructive sleep apnea (adult) (pediatric): Secondary | ICD-10-CM | POA: Insufficient documentation

## 2016-11-27 LAB — HM DIABETES EYE EXAM

## 2016-12-03 ENCOUNTER — Encounter: Payer: Self-pay | Admitting: Family Medicine

## 2016-12-06 DIAGNOSIS — R0683 Snoring: Secondary | ICD-10-CM

## 2016-12-06 NOTE — Procedures (Signed)
Patient Name: Laura Benson, [place] Study Date: 11/26/2016 Gender: Female D.O.B: [birth year] Age (years): 25 Referring Provider: Abbe AmsterdamShannon Banks MD Height (inches): 66 Interpreting Physician: Jetty Duhamellinton Young MD, ABSM Weight (lbs): 496 RPSGT: Shelah LewandowskyGregory, [place] BMI: 81 MRN: [id number] Neck Size: 20.00 CLINICAL INFORMATION Sleep Study Type: NPSG  Indication for sleep study: Daytime Fatigue, Depression, Diabetes, Fatigue, Hypertension, Insomnia, Morbid Obesity, Non-refreshing Sleep, Obesity, Re-Evaluation, Snoring, Witnesses Apnea / Gasping During Sleep  Epworth Sleepiness Score: 14  SLEEP STUDY TECHNIQUE As per the AASM Manual for the Scoring of Sleep and Associated Events v2.3 (April 2016) with a hypopnea requiring 4% desaturations.  The channels recorded and monitored were frontal, central and occipital EEG, electrooculogram (EOG), submentalis EMG (chin), nasal and oral airflow, thoracic and abdominal wall motion, anterior tibialis EMG, snore microphone, electrocardiogram, and pulse oximetry.  MEDICATIONS Medications self-administered by patient taken the night of the study : none reported  SLEEP ARCHITECTURE The study was initiated at 10:45:29 PM and ended at 5:23:03 AM.  Sleep onset time was 45.0 minutes and the sleep efficiency was 80.0%. The total sleep time was 318.0 minutes.  Stage REM latency was 97.0 minutes.  The patient spent 5.03% of the night in stage N1 sleep, 75.63% in stage N2 sleep, 0.31% in stage N3 and 19.03% in REM.  Alpha intrusion was absent.  Supine sleep was 77.82%.  RESPIRATORY PARAMETERS The overall apnea/hypopnea index (AHI) was 20.8 per hour. There were 12 total apneas, including 11 obstructive, 1 central and 0 mixed apneas. There were 98 hypopneas and 2 RERAs.  The AHI during Stage REM sleep was 60.5 per hour.  AHI while supine was 26.2 per hour.  The mean oxygen saturation was 87.42%. The minimum SpO2 during sleep was 64.00%.  moderate snoring was  noted during this study.  CARDIAC DATA The 2 lead EKG demonstrated sinus rhythm. The mean heart rate was 91.02 beats per minute. Other EKG findings include: PVCs.  LEG MOVEMENT DATA The total PLMS were 0 with a resulting PLMS index of 0.00. Associated arousal with leg movement index was 0.0 .  IMPRESSIONS - Moderate obstructive sleep apnea occurred during this study (AHI = 20.8/h). - No significant central sleep apnea occurred during this study (CAI = 0.2/h). - Severe oxygen desaturation was noted during this study (Min O2 = 64.00%, Mean 87.4%). - The patient snored with moderate snoring volume. - EKG findings include PVCs. - Clinically significant periodic limb movements did not occur during sleep. No significant associated arousals.  DIAGNOSIS - Obstructive Sleep Apnea (327.23 [G47.33 ICD-10]) - Nocturnal Hypoxemia (327.26 [G47.36 ICD-10])  RECOMMENDATIONS - CPAP titration to determine therapeutic pressure, and to assess correction of hypoxemia. - Positional therapy avoiding supine position during sleep. - Be careful with alcohol, sedatives and other CNS depressants that may worsen sleep apnea and disrupt normal sleep architecture. - Sleep hygiene should be reviewed to assess factors that may improve sleep quality. - Weight management and regular exercise should be initiated or continued if appropriate.  [Electronically signed] 12/06/2016 03:03 PM  Jetty Duhamellinton Young MD, ABSM Diplomate, American Board of Sleep Medicine   NPI: [id number]

## 2016-12-10 ENCOUNTER — Encounter: Payer: Self-pay | Admitting: Family Medicine

## 2016-12-10 NOTE — Progress Notes (Signed)
error 

## 2016-12-11 ENCOUNTER — Encounter: Payer: Self-pay | Admitting: Internal Medicine

## 2016-12-11 ENCOUNTER — Ambulatory Visit (INDEPENDENT_AMBULATORY_CARE_PROVIDER_SITE_OTHER): Payer: 59 | Admitting: Internal Medicine

## 2016-12-11 VITALS — BP 130/76 | HR 102 | Ht 65.0 in | Wt >= 6400 oz

## 2016-12-11 DIAGNOSIS — G4733 Obstructive sleep apnea (adult) (pediatric): Secondary | ICD-10-CM | POA: Diagnosis not present

## 2016-12-11 NOTE — Patient Instructions (Signed)
Order- new DME (moving to [facility]), new CPAP auto 5-20, mask of choice, humidifier, supplies, AirView  Dx OSA  Please call as needed  Phone number for Bariatric Program:

## 2016-12-11 NOTE — Assessment & Plan Note (Signed)
Moderately severe obstructive sleep apnea.  We discussed basic sleep habits, medical concerns of sleep apnea, treatment options, responsibility to drive safely and importance of body weight. Plan-begin with CPAP auto titration

## 2016-12-11 NOTE — Assessment & Plan Note (Signed)
Sadly overweight with significant impact on her quality of life and medical health issues. Plan-she is given phone number for bariatric program

## 2016-12-11 NOTE — Progress Notes (Signed)
12/11/16-26 year old female former smoker for sleep evaluation. NPSG 11/26/16-AHI 20.8/hour, oxygen saturation mean 87%, minimum 64%, body weight 496 pounds Medical problem list includes DM 2, depression, HBP, morbid obesity Complains of not breathing in her sleep, restless and nonrestorative sleep, wakes tired and often with morning headache. ENT surgery-tonsils, septoplasty.  Denies lung or heart problems.  Father uses CPAP. Epworth Score 13  Prior to Admission medications   Medication Sig Start Date End Date Taking? Authorizing Provider  albuterol (PROVENTIL HFA;VENTOLIN HFA) 108 (90 Base) MCG/ACT inhaler Inhale 2 puffs into the lungs every 6 (six) hours as needed for wheezing or shortness of breath. 01/23/16  Yes Hairston, Maylon Peppers, FNP  blood glucose meter kit and supplies KIT Dispense based on patient and insurance preference. Use up to four times daily as directed. (FOR ICD-9 250.00, 250.01). 10/15/16  Yes Billie Ruddy, MD  FLUoxetine (PROZAC) 20 MG tablet Take 1 tablet (20 mg total) by mouth every morning. 10/15/16  Yes Billie Ruddy, MD  glucose blood (TRUE METRIX BLOOD GLUCOSE TEST) test strip Use as instructed 01/23/16  Yes Hairston, Mandesia R, FNP  hydrochlorothiazide (HYDRODIURIL) 25 MG tablet Take 1 tablet (25 mg total) by mouth daily. 10/01/16  Yes Billie Ruddy, MD  ibuprofen (ADVIL,MOTRIN) 200 MG tablet Take 400 mg by mouth every 6 (six) hours as needed for fever, headache, mild pain, moderate pain or cramping.   Yes [provider]  lisinopril (PRINIVIL,ZESTRIL) 10 MG tablet Take 1 tablet (10 mg total) by mouth daily. 01/23/16  Yes Hairston, Maylon Peppers, FNP  metFORMIN (GLUCOPHAGE) 500 MG tablet Take 1 tablet (500 mg total) by mouth 2 (two) times daily with a meal. Patient taking differently: Take 500 mg daily with breakfast by mouth.  10/01/16  Yes Billie Ruddy, MD  [place] Patient is to test up to four times daily. Dx E11.9 11/05/16   Yes Billie Ruddy, MD  Blood Pressure Monitoring (BLOOD PRESSURE MONITOR/L CUFF) MISC Check your blood pressure daily. Patient not taking: Reported on 12/11/2016 10/15/16   Billie Ruddy, MD  Dulaglutide (TRULICITY) 6.16 WV/3.7TG SOPN Inject 0.75 mg into the skin once a week. Patient not taking: Reported on 12/11/2016 10/15/16   Billie Ruddy, MD   Past Surgical History:  Procedure Laterality Date  . ADENOIDECTOMY    . TONSILLECTOMY     Past Medical History:  Diagnosis Date  . Asthma   . Depression   . Diabetes mellitus without complication ([place])   . Hypertension   . Morbid obesity ([place])   . Thyroid disease    Social History   Socioeconomic History  . Marital status: Single    Spouse name: Not on file  . Number of children: 0  . Years of education: Not on file  . Highest education level: Not on file  Social Needs  . Financial resource strain: Not on file  . Food insecurity - worry: Not on file  . Food insecurity - inability: Not on file  . Transportation needs - medical: Not on file  . Transportation needs - non-medical: Not on file  Occupational History    Employer: [employer]  Tobacco Use  . Smoking status: Former Smoker    Types: Cigarettes  . Smokeless tobacco: Never Used  Substance and Sexual Activity  . Alcohol use: No  . Drug use: No  . Sexual activity: Yes    Birth control/protection: None    Comment: No form of birth control used.  Other Topics Concern  . Not on file  Social History Narrative  . Not on file   Breast Cancer-relatedfamily history includes Breast cancer in her cousin.   ROS-see HPI   Negative unless + = positive Constitutional:    weight loss, night sweats, fevers, chills, + fatigue, lassitude. HEENT:    headaches, difficulty swallowing, tooth/dental problems, sore throat,       sneezing, itching, ear ache, nasal congestion, post nasal drip, snoring CV:    chest pain, orthopnea, PND, swelling in lower extremities, anasarca,                                   dizziness, palpitations Resp:   shortness of breath with exertion or at rest.                productive cough,   non-productive cough, coughing up of blood.              change in color of mucus.  wheezing.   Skin:    rash or lesions. GI:  No-   heartburn, indigestion, abdominal pain, nausea, vomiting, diarrhea,                 change in bowel habits, loss of appetite GU: dysuria, change in color of urine, no urgency or frequency.   flank pain. MS:   joint pain, stiffness, decreased range of motion, back pain. Neuro-     nothing unusual Psych:  change in mood or affect.  depression or anxiety.   memory loss.  OBJ- Physical Exam General- Alert, Oriented, Affect-appropriate, Distress- none acute + morbidly obese Skin- rash-none, lesions- none, excoriation- none Lymphadenopathy- none Head- atraumatic            Eyes- Gross vision intact, PERRLA, conjunctivae and secretions clear            Ears- Hearing, canals-normal            Nose- Clear, no-Septal dev, mucus, polyps, erosion, perforation             Throat- Mallampati III , mucosa clear , drainage- none, tonsils- atrophic Neck- flexible , trachea midline, no stridor , thyroid nl, carotid no bruit Chest - symmetrical excursion , unlabored           Heart/CV- RRR , no murmur , no gallop  , no rub, nl s1 s2                           - JVD- none , edema- none, stasis changes- none, varices- none           Lung- clear to P&A, wheeze- none, cough- none , dullness-none, rub- none           Chest wall-  Abd-  Br/ Gen/ Rectal- Not done, not indicated Extrem- cyanosis- none, clubbing, none, atrophy- none, strength- nl Neuro- grossly intact to observation

## 2016-12-12 ENCOUNTER — Telehealth: Payer: Self-pay | Admitting: Family Medicine

## 2016-12-12 MED ORDER — ALBUTEROL SULFATE HFA 108 (90 BASE) MCG/ACT IN AERS: 2.0000 | INHALATION_SPRAY | Freq: Four times a day (QID) | RESPIRATORY_TRACT | 2 refills | Status: DC | PRN

## 2016-12-12 NOTE — Telephone Encounter (Signed)
Copied from CRM [phone]. Topic: General - Other >> Dec 12, 2016 11:16 AM Raquel SarnaHayes, Teresa G wrote: Inhaler - Buterol medication - 2 pumps left

## 2016-12-26 ENCOUNTER — Telehealth: Payer: Self-pay | Admitting: Emergency Medicine

## 2016-12-26 NOTE — Telephone Encounter (Signed)
Left a VM for patient to give the office a call back regarding making a follow up appointment so she can receive a refill for the Fluoxetine. No further refills until appointment is made per Dr. Salomon FickBanks.

## 2016-12-30 ENCOUNTER — Telehealth: Payer: Self-pay | Admitting: Emergency Medicine

## 2016-12-30 DIAGNOSIS — F321 Major depressive disorder, single episode, moderate: Secondary | ICD-10-CM

## 2016-12-30 MED ORDER — FLUOXETINE HCL 20 MG PO TABS: 20.0000 mg | ORAL_TABLET | Freq: Every morning | ORAL | 0 refills | Status: DC

## 2016-12-30 NOTE — Telephone Encounter (Signed)
Patient has been scheduled to come in for a f/u visit with PCP. 90 day supply for Prozac has been sent to patient pharmacy.

## 2017-01-09 ENCOUNTER — Ambulatory Visit: Payer: 59 | Admitting: Family Medicine

## 2017-01-22 ENCOUNTER — Other Ambulatory Visit: Payer: Self-pay | Admitting: Emergency Medicine

## 2017-01-22 DIAGNOSIS — E119 Type 2 diabetes mellitus without complications: Secondary | ICD-10-CM

## 2017-01-22 MED ORDER — METFORMIN HCL 500 MG PO TABS: 500.0000 mg | ORAL_TABLET | Freq: Two times a day (BID) | ORAL | 0 refills | Status: DC

## 2017-02-16 ENCOUNTER — Telehealth: Payer: Self-pay | Admitting: Emergency Medicine

## 2017-02-16 NOTE — Telephone Encounter (Signed)
Please would like to switch PCP. Please if okay with Dr. Carmelia RollerWendling.

## 2017-02-16 NOTE — Telephone Encounter (Signed)
Copied from CRM [phone]. Topic: Appointment Scheduling - Scheduling Inquiry for Clinic >> Feb 16, 2017  8:05 AM Crist InfanteHarrald, Kathy J wrote: Reason for CRM: pt states she moved to [place] and requesting to switch to the [place] office. Would like to switch to Dr Carmelia RollerWendling, if OK. OK with you DR Salomon FickBanks?

## 2017-02-16 NOTE — Telephone Encounter (Signed)
That's fine

## 2017-02-16 NOTE — Telephone Encounter (Signed)
Spoke with patient is she aware that Dr. Carmelia RollerWendling did say it was ok to transfer care. Patient is aware to call the office and set up an appointment. Nothing further needed.

## 2017-02-16 NOTE — Telephone Encounter (Signed)
OK w me.  

## 2017-02-21 ENCOUNTER — Emergency Department (HOSPITAL_COMMUNITY): Payer: 59

## 2017-02-21 ENCOUNTER — Encounter (HOSPITAL_COMMUNITY): Payer: Self-pay | Admitting: Emergency Medicine

## 2017-02-21 ENCOUNTER — Emergency Department (HOSPITAL_COMMUNITY)
Admission: EM | Admit: 2017-02-21 | Discharge: 2017-02-21 | Disposition: A | Discharge status: Home / Self Care | Payer: 59 | Attending: Emergency Medicine | Admitting: Emergency Medicine

## 2017-02-21 DIAGNOSIS — K429 Umbilical hernia without obstruction or gangrene: Secondary | ICD-10-CM | POA: Insufficient documentation

## 2017-02-21 DIAGNOSIS — Z87891 Personal history of nicotine dependence: Secondary | ICD-10-CM | POA: Diagnosis not present

## 2017-02-21 DIAGNOSIS — R1084 Generalized abdominal pain: Secondary | ICD-10-CM | POA: Diagnosis present

## 2017-02-21 DIAGNOSIS — Z79899 Other long term (current) drug therapy: Secondary | ICD-10-CM | POA: Diagnosis not present

## 2017-02-21 DIAGNOSIS — J45909 Unspecified asthma, uncomplicated: Secondary | ICD-10-CM | POA: Diagnosis not present

## 2017-02-21 DIAGNOSIS — I10 Essential (primary) hypertension: Secondary | ICD-10-CM | POA: Diagnosis not present

## 2017-02-21 DIAGNOSIS — E119 Type 2 diabetes mellitus without complications: Secondary | ICD-10-CM | POA: Insufficient documentation

## 2017-02-21 DIAGNOSIS — Z7984 Long term (current) use of oral hypoglycemic drugs: Secondary | ICD-10-CM | POA: Insufficient documentation

## 2017-02-21 LAB — I-STAT BETA HCG BLOOD, ED (MC, WL, AP ONLY): I-stat hCG, quantitative: <5 m[IU]/mL (ref <5)

## 2017-02-21 LAB — COMPREHENSIVE METABOLIC PANEL
ALBUMIN: 4 g/dL (ref 3.5–5.0)
ALT: 21 U/L (ref 14–54)
AST: 16 U/L (ref 15–41)
Alkaline Phosphatase: 56 U/L (ref 38–126)
Anion gap: 8 (ref 5–15)
BUN: 12 mg/dL (ref 6–20)
CHLORIDE: 101 mmol/L (ref 101–111)
CO2: 26 mmol/L (ref 22–32)
Calcium: 9.1 mg/dL (ref 8.9–10.3)
Creatinine, Ser: 0.75 mg/dL (ref 0.44–1.00)
GFR calc Af Amer: >60 mL/min (ref >60)
GLUCOSE: 227 mg/dL — AB (ref 65–99)
Potassium: 3.4 mmol/L — ABNORMAL LOW (ref 3.5–5.1)
SODIUM: 135 mmol/L (ref 135–145)
Total Bilirubin: 1 mg/dL (ref 0.3–1.2)
Total Protein: 7.7 g/dL (ref 6.5–8.1)

## 2017-02-21 LAB — CBC
HEMATOCRIT: 39.8 % (ref 36.0–46.0)
Hemoglobin: 13.1 g/dL (ref 12.0–15.0)
MCH: 24.5 pg — AB (ref 26.0–34.0)
MCHC: 32.9 g/dL (ref 30.0–36.0)
MCV: 74.5 fL — AB (ref 78.0–100.0)
PLATELETS: 288 10*3/uL (ref 150–400)
RBC: 5.34 MIL/uL — ABNORMAL HIGH (ref 3.87–5.11)
RDW: 14.4 % (ref 11.5–15.5)
WBC: 10 10*3/uL (ref 4.0–10.5)

## 2017-02-21 LAB — URINALYSIS, ROUTINE W REFLEX MICROSCOPIC
BILIRUBIN URINE: NEGATIVE
Glucose, UA: >=500 mg/dL — AB
Hgb urine dipstick: NEGATIVE
Ketones, ur: 5 mg/dL — AB
LEUKOCYTES UA: NEGATIVE
Nitrite: NEGATIVE
Protein, ur: NEGATIVE mg/dL
SPECIFIC GRAVITY, URINE: 1.025 (ref 1.005–1.030)
pH: 5 (ref 5.0–8.0)

## 2017-02-21 LAB — LIPASE, BLOOD: LIPASE: 26 U/L (ref 11–51)

## 2017-02-21 MED ORDER — DICYCLOMINE HCL 20 MG PO TABS: 20.0000 mg | ORAL_TABLET | Freq: Two times a day (BID) | ORAL | 0 refills | Status: DC

## 2017-02-21 MED ORDER — IOPAMIDOL (ISOVUE-300) INJECTION 61%
INTRAVENOUS | Status: AC
  Administered 2017-02-21: 100 mL
  Filled 2017-02-21: qty 100

## 2017-02-21 NOTE — Discharge Instructions (Signed)
Your CT scan today shows that you have a small umbilical hernia. You will need to follow up with your doctor for the hernia and for your blood sugar and abdominal pain. We are giving you medication to take but if the pain increases before your follow up return here.

## 2017-02-21 NOTE — ED Triage Notes (Signed)
Patient c/o abd pain that started yesterday and been constant. Reports this morning had dysuria and urine was dark color. Denies any n/v/d since abd started but vomited couple days ago at work. Patient reports that pain makes her walk different

## 2017-02-21 NOTE — ED Provider Notes (Signed)
[facility] DEPT Provider Note   CSN: [id number] Arrival date & time: 02/21/17  1154     History   Chief Complaint Chief Complaint  Patient presents with  . Abdominal Pain  . Dysuria    HPI Laura Benson is a 27 y.o.morbidly obese female who presents to the ED for abdominal pain. Patient reports that the pain started yesterday and has been constant.  This morning patient reports that her urine was dark. Patient denies n/v/d today but reports vomiting once a couple days ago while at work. Patient unsure if she has had fever.  The history is provided by the patient. No language interpreter was used.  Abdominal Pain   This is a new problem. The current episode started yesterday. The problem occurs constantly. The problem has not changed since onset.The pain is located in the generalized abdominal region. The pain is at a severity of 10/10. Associated symptoms include nausea and frequency. Pertinent negatives include dysuria. Fever: ? Nothing aggravates the symptoms. Nothing relieves the symptoms.  Dysuria   Associated symptoms include nausea and frequency.    Past Medical History:  Diagnosis Date  . Asthma   . Depression   . Diabetes mellitus without complication ([place])   . Hypertension   . Morbid obesity ([place])   . Thyroid disease     Patient Active Problem List   Diagnosis Date Noted  . Obstructive sleep apnea 12/11/2016  . Diabetes mellitus without complication ([place]) 65/03/5463  . Hypertension 01/23/2016  . Dysmenorrhea 12/25/2013  . Rectal bleeding 10/20/2012  . DEPRESSION 01/05/2009  . BREAST PAIN, BILATERAL 01/05/2009  . INTERTRIGO, CANDIDAL 01/05/2009  . ABSENCE OF MENSTRUATION 09/20/2008  . DELAYED MENSES 08/25/2008  . ASCUS PAP 05/05/2008  . OBESITY, CLASS III 03/26/2006  . HYPERTENSION, BENIGN SYSTEMIC 03/26/2006    Past Surgical History:  Procedure Laterality Date  . ADENOIDECTOMY    . TONSILLECTOMY      OB History    Gravida Para Term Preterm AB Living   0             SAB TAB Ectopic Multiple Live Births                   Home Medications    Prior to Admission medications   Medication Sig Start Date End Date Taking? Authorizing Provider  albuterol (PROVENTIL HFA;VENTOLIN HFA) 108 (90 Base) MCG/ACT inhaler Inhale 2 puffs every 6 (six) hours as needed into the lungs for wheezing or shortness of breath. 12/12/16   Billie Ruddy, MD  blood glucose meter kit and supplies KIT Dispense based on patient and insurance preference. Use up to four times daily as directed. (FOR ICD-9 250.00, 250.01). 10/15/16   Billie Ruddy, MD  Blood Pressure Monitoring (BLOOD PRESSURE MONITOR/L CUFF) MISC Check your blood pressure daily. Patient not taking: Reported on 12/11/2016 10/15/16   Billie Ruddy, MD  dicyclomine (BENTYL) 20 MG tablet Take 1 tablet (20 mg total) by mouth 2 (two) times daily. 02/21/17   Ashley Murrain, NP  Dulaglutide (TRULICITY) 6.81 EX/5.1ZG SOPN Inject 0.75 mg into the skin once a week. Patient not taking: Reported on 12/11/2016 10/15/16   Billie Ruddy, MD  FLUoxetine (PROZAC) 20 MG tablet Take 1 tablet (20 mg total) by mouth every morning. 12/30/16   Billie Ruddy, MD  glucose blood (TRUE METRIX BLOOD GLUCOSE TEST) test strip Use as instructed 01/23/16   Alfonse Spruce, FNP  hydrochlorothiazide (HYDRODIURIL) 25 MG  tablet Take 1 tablet (25 mg total) by mouth daily. 10/01/16   Billie Ruddy, MD  ibuprofen (ADVIL,MOTRIN) 200 MG tablet Take 400 mg by mouth every 6 (six) hours as needed for fever, headache, mild pain, moderate pain or cramping.    [provider]  lisinopril (PRINIVIL,ZESTRIL) 10 MG tablet Take 1 tablet (10 mg total) by mouth daily. 01/23/16   Alfonse Spruce, FNP  metFORMIN (GLUCOPHAGE) 500 MG tablet Take 1 tablet (500 mg total) by mouth 2 (two) times daily with a meal. 01/22/17   Billie Ruddy, MD  [place] Patient is to test up to  four times daily. Dx E11.9 11/05/16   Billie Ruddy, MD    Family History Family History  Problem Relation Age of Onset  . Hypertension Mother   . Diabetes Mother   . Colon cancer Father   . Hypertension Maternal Grandmother   . Hypertension Paternal Grandmother   . Diabetes Paternal Grandmother   . Breast cancer Cousin     Social History Social History   Tobacco Use  . Smoking status: Former Smoker    Types: Cigarettes  . Smokeless tobacco: Never Used  Substance Use Topics  . Alcohol use: No  . Drug use: No     Allergies   Patient has no known allergies.   Review of Systems Review of Systems  Constitutional: Fever: ?  Gastrointestinal: Positive for abdominal pain and nausea.  Genitourinary: Positive for frequency. Negative for dysuria.  All other systems reviewed and are negative.    Physical Exam Updated Vital Signs BP (!) 160/113 (BP Location: Right Arm)   Pulse 100   Temp 98.2 F (36.8 C) (Oral)   Resp 19   LMP 02/07/2017   SpO2 99%   Physical Exam  Constitutional: No distress.  Morbidly obese  HENT:  Head: Normocephalic and atraumatic.  Eyes: EOM are normal.  Neck: Neck supple.  Cardiovascular: Normal rate and regular rhythm.  Pulmonary/Chest: Effort normal and breath sounds normal.  Abdominal: Soft. Bowel sounds are normal. There is generalized tenderness. There is no rebound, no guarding and no CVA tenderness.  Musculoskeletal: Normal range of motion.  Neurological: She is alert.  Skin: Skin is warm and dry.  Psychiatric: She has a normal mood and affect.  Nursing note and vitals reviewed.    ED Treatments / Results  Labs (all labs ordered are listed, but only abnormal results are displayed) Labs Reviewed  COMPREHENSIVE METABOLIC PANEL - Abnormal; Notable for the following components:      Result Value   Potassium 3.4 (*)    Glucose, Bld 227 (*)    All other components within normal limits  CBC - Abnormal; Notable for the  following components:   RBC 5.34 (*)    MCV 74.5 (*)    MCH 24.5 (*)    All other components within normal limits  URINALYSIS, ROUTINE W REFLEX MICROSCOPIC - Abnormal; Notable for the following components:   Glucose, UA >=500 (*)    Ketones, ur 5 (*)    Bacteria, UA FEW (*)    Squamous Epithelial / LPF 0-5 (*)    All other components within normal limits  LIPASE, BLOOD  I-STAT BETA HCG BLOOD, ED ([facility], [facility], [facility] ONLY)  Radiology Ct Abdomen Pelvis W Contrast  Result Date: 02/21/2017 CLINICAL DATA:  Abdominal pain since yesterday, constant, associated dysuria with dark-colored urine, history of asthma, diabetes mellitus, hypertension, former smoker EXAM: CT ABDOMEN AND PELVIS  WITH CONTRAST TECHNIQUE: Multidetector CT imaging of the abdomen and pelvis was performed using the standard protocol following bolus administration of intravenous contrast. Sagittal and coronal MPR images reconstructed from axial data set. CONTRAST:  189m ISOVUE-300 IOPAMIDOL (ISOVUE-300) INJECTION 61% IV. No oral contrast administered. COMPARISON:  None FINDINGS: Lower chest: Lung bases clear Hepatobiliary: Gallbladder and liver normal appearance Pancreas: Normal appearance Spleen: Normal appearance Adrenals/Urinary Tract: Adrenal glands, kidneys, ureters, and bladder normal appearance Stomach/Bowel: Normal appendix. Stomach and bowel loops normal appearance for technique Vascular/Lymphatic: Vascular structures normal appearance. No adenopathy. Reproductive: Uterus and adnexa normal appearance Other: Small umbilical hernia containing fat. No free air, free fluid, or acute inflammatory process. Musculoskeletal: Normal appearance IMPRESSION: Small umbilical hernia containing fat. Otherwise negative exam. Electronically Signed   By: MLavonia DanaM.D.   On: 02/21/2017 20:27    Procedures Procedures (including critical care time)  Medications Ordered in ED Medications  iopamidol (ISOVUE-300) 61 % injection (100 mLs  Contrast Given  02/21/17 1941)     Initial Impression / Assessment and Plan / ED Course  I have reviewed the triage vital signs and the nursing notes.  27y.o. female with generalized abdominal pain stable for d/c with only small hernia visualized on CT scan. No obstruction, appendicitis or other acute findings. No guarding or rebound on exam. Discussed with the patient clinical and CT and lab findings and plan of care. Discussed f/u for elevated blood sugar and for umbilical hernia. Patient agrees with plan. Return precautions discussed. Patient appears comfortable at d/c.  Final Clinical Impressions(s) / ED Diagnoses   Final diagnoses:  Generalized abdominal pain  Umbilical hernia without obstruction and without gangrene    ED Discharge Orders        Ordered    dicyclomine (BENTYL) 20 MG tablet  2 times daily     02/21/17 2054       NDebroah BallerMPark Center NWisconsin01/26/19 2107    [place], CGwenyth Allegra MD 02/21/17 [phone]

## 2017-02-25 ENCOUNTER — Ambulatory Visit: Payer: Self-pay | Admitting: *Deleted

## 2017-02-25 ENCOUNTER — Ambulatory Visit (INDEPENDENT_AMBULATORY_CARE_PROVIDER_SITE_OTHER): Payer: 59 | Admitting: Family Medicine

## 2017-02-25 ENCOUNTER — Encounter: Payer: Self-pay | Admitting: Family Medicine

## 2017-02-25 VITALS — BP 128/90 | HR 96 | Temp 98.4°F | Ht 65.5 in | Wt >= 6400 oz

## 2017-02-25 DIAGNOSIS — K429 Umbilical hernia without obstruction or gangrene: Secondary | ICD-10-CM | POA: Diagnosis not present

## 2017-02-25 NOTE — Telephone Encounter (Signed)
FYI

## 2017-02-25 NOTE — Progress Notes (Signed)
Chief Complaint  Patient presents with  . Abdominal Pain  . Nausea  . Dizziness       New Patient Visit SUBJECTIVE: HPI: Laura Benson is an 27 y.o.female who is being seen for establishing care.  The patient was previously seen at [facility]- she just moved into area.  8 days has been having dizziness, nausea, and umbilical hernia. It is painful over belly button region. She has to lift soda crates for work and thinks this may have started it. She does not feel a bulge. +nausea and decreased PO intake. She has subsequent light headedness and HA. No numbness, tingling, weakness, fevers, bowel changes, bleeding.  No Known Allergies  Past Medical History:  Diagnosis Date  . Asthma   . Depression   . Diabetes mellitus without complication ([place])   . Hypertension   . Morbid obesity ([place])   . Thyroid disease    Past Surgical History:  Procedure Laterality Date  . ADENOIDECTOMY    . TONSILLECTOMY     Social History   Socioeconomic History  . Marital status: Single  Occupational History    Employer: [employer]  Tobacco Use  . Smoking status: Former Smoker    Types: Cigarettes  . Smokeless tobacco: Never Used  Substance and Sexual Activity  . Alcohol use: No  . Drug use: No  . Sexual activity: Yes    Birth control/protection: None    Comment: No form of birth control used.   Family History  Problem Relation Age of Onset  . Hypertension Mother   . Diabetes Mother   . Colon cancer Father   . Hypertension Maternal Grandmother   . Hypertension Paternal Grandmother   . Diabetes Paternal Grandmother   . Breast cancer Cousin     Current Outpatient Medications:  .  albuterol (PROVENTIL HFA;VENTOLIN HFA) 108 (90 Base) MCG/ACT inhaler, Inhale 2 puffs every 6 (six) hours as needed into the lungs for wheezing or shortness of breath., Disp: 1 Inhaler, Rfl: 2 .  blood glucose meter kit and supplies KIT, Dispense based on patient and insurance preference. Use up to four times  daily as directed. (FOR ICD-9 250.00, 250.01)., Disp: 1 each, Rfl: 0 .  Blood Pressure Monitoring (BLOOD PRESSURE MONITOR/L CUFF) MISC, Check your blood pressure daily., Disp: 1 each, Rfl: 0 .  dicyclomine (BENTYL) 20 MG tablet, Take 1 tablet (20 mg total) by mouth 2 (two) times daily., Disp: 20 tablet, Rfl: 0 .  FLUoxetine (PROZAC) 20 MG tablet, Take 1 tablet (20 mg total) by mouth every morning., Disp: 90 tablet, Rfl: 0 .  glucose blood (TRUE METRIX BLOOD GLUCOSE TEST) test strip, Use as instructed, Disp: 100 each, Rfl: 12 .  hydrochlorothiazide (HYDRODIURIL) 25 MG tablet, Take 1 tablet (25 mg total) by mouth daily., Disp: 90 tablet, Rfl: 2 .  ibuprofen (ADVIL,MOTRIN) 200 MG tablet, Take 400 mg by mouth every 6 (six) hours as needed for fever, headache, mild pain, moderate pain or cramping., Disp: , Rfl:  .  lisinopril (PRINIVIL,ZESTRIL) 10 MG tablet, Take 1 tablet (10 mg total) by mouth daily., Disp: 90 tablet, Rfl: 2 .  metFORMIN (GLUCOPHAGE) 500 MG tablet, Take 1 tablet (500 mg total) by mouth 2 (two) times daily with a meal., Disp: 60 tablet, Rfl: 0 .  ONETOUCH DELICA LANCETS FINE MISC, Patient is to test up to four times daily. Dx E11.9, Disp: 100 each, Rfl: 11  Patient's last menstrual period was 02/07/2017.  ROS Const: Denies fevers  GI: As noted  in HPI   OBJECTIVE: BP 128/90 (BP Location: Left Arm, Patient Position: Sitting, Cuff Size: Large)   Pulse 96   Temp 98.4 F (36.9 C) (Oral)   Ht 5' 5.5" (1.664 m)   Wt (!) 481 lb 8 oz (218.4 kg)   LMP 02/07/2017   SpO2 97%   BMI 78.91 kg/m   Constitutional: -  VS reviewed -  Well developed, well nourished, appears stated age -  No apparent distress  Psychiatric: -  Oriented to person, place, and time -  Memory intact -  Affect and mood normal -  Fluent conversation, good eye contact -  Judgment and insight age appropriate  Eye: -  Conjunctivae clear, no discharge -  Pupils symmetric, round, reactive to light  ENMT: -  MMM     Pharynx moist, no exudate, no erythema  Neck: -  No gross swelling, no palpable masses -  Thyroid midline, not enlarged, mobile, no palpable masses  Cardiovascular: -  RRR, decreased heart sounds -  No LE edema  Respiratory: -  Normal respiratory effort, no accessory muscle use, no retraction -  Breath sounds equal, though diffusely decreased, no wheezes, no ronchi, no crackles  Gastrointestinal: -  Bowel sounds normal -  TTP over umbilical region, no obvious bulging appreciated, no distention, no guarding, no masses  Musculoskeletal: -  No clubbing, no cyanosis -  Gait normal for habitus  Skin: -  No significant lesion on inspection -  Warm and dry to palpation   ASSESSMENT/PLAN: Umbilical hernia without obstruction and without gangrene - Plan: Ambulatory referral to General Surgery  Morbid obesity ([place]) - Plan: Amb Ref to Medical Weight Management  Refer gen surg for CT proven umbilical hernia that is symptomatic. Refer to non surgical med wt loss. [place]al[place]y diet handout given. Counseled on diet and exercise. HA and lightheadedness likely 2/2 decreased PO intake.  Patient should return as originally scheduled in 1 week. The patient voiced understanding and agreement to the plan.   [place], DO 02/25/17  2:54 PM

## 2017-02-25 NOTE — Telephone Encounter (Signed)
Laura BradfordKimberly phoned with c/o dizziness and nausea that she associates with an umbilical hernia which she was recently diagnosed with on 02/21/17 in the ER. She has an appointment with Dr. Carmelia RollerWendling 2/6 to establish PCP care but needs to be seen today for an acute visit.   Reason for Disposition . [1] Dizziness (vertigo) present now AND [2] one or more stroke risk factors (i.e., hypertension, diabetes, prior stroke/TIA/heart attack)  (Exception: prior physician evaluation for this AND no different/worse than usual)  Answer Assessment - Initial Assessment Questions 1. DESCRIPTION: "Describe your dizziness."     Comes on while standing, room spins, nausea. It comes and goes now for a week. When she sits down, it goes away.  2. LIGHTHEADED: "Do you feel lightheaded?" (e.g., somewhat faint, woozy, weak upon standing)    no 3. VERTIGO: "Do you feel like either you or the room is spinning or tilting?" (i.e. vertigo)    yes 4. SEVERITY: "How bad is it?"  "Do you feel like you are going to faint?" "Can you stand and walk?"   - MILD - walking normally   - MODERATE - interferes with normal activities (e.g., work, school)    - SEVERE - unable to stand, requires support to walk, feels like passing out now.    moderate 5. ONSET:  "When did the dizziness begin?"     Last Tuesday, spells will last about 15 minutes 6. AGGRAVATING FACTORS: "Does anything make it worse?" (e.g., standing, change in head position)  no, just starts out of the blue. 7. HEART RATE: "Can you tell me your heart rate?" "How many beats in 15 seconds?"  (Note: not all patients can do this)      Can't do right now, at work but she denies heart racing and palpitations 8. CAUSE: "What do you think is causing the dizziness?"   Maybe the pain from the hernia.  9. RECURRENT SYMPTOM: "Have you had dizziness before?" If so, ask: "When was the last time?" "What happened that time?"    no 10. OTHER SYMPTOMS: "Do you have any other symptoms?" (e.g.,  fever, chest pain, vomiting, diarrhea, bleeding)       Mild fevers on and off this week. 11. PREGNANCY: "Is there any chance you are pregnant?" "When was your last menstrual period?"       no  Protocols used: DIZZINESS - VERTIGO-A-AH, DIZZINESS - [facility] AIGHTHEADEDNESS-A-AH

## 2017-02-25 NOTE — Progress Notes (Signed)
Pre visit review using our clinic review tool, if applicable. No additional management support is needed unless otherwise documented below in the visit note. 

## 2017-02-25 NOTE — Addendum Note (Signed)
Addended by: Radene GunningWENDLING, Ingris Pasquarella P on: 02/25/2017 02:57 PM   Modules accepted: Level of Service

## 2017-02-25 NOTE — Patient Instructions (Addendum)
If you do not hear anything about your referral in the next 1-2 weeks, call our office and ask for an update.  Things to look out for: fevers, worsening pain, bowel changes.   Healthy Eating Plan Many factors influence your heart health, including eating and exercise habits. Heart (coronary) risk increases with abnormal blood fat (lipid) levels. Heart-healthy meal planning includes limiting unhealthy fats, increasing healthy fats, and making other small dietary changes. This includes maintaining a healthy body weight to help keep lipid levels within a normal range.  WHAT IS MY PLAN?  Your health care provider recommends that you:  Drink a glass of water before meals to help with satiety.  Eat slowly.  An alternative to the water is to add Metamucil. This will help with satiety as well. It does contain calories, unlike water.  WHAT TYPES OF FAT SHOULD I CHOOSE?  Choose healthy fats more often. Choose monounsaturated and polyunsaturated fats, such as olive oil and canola oil, flaxseeds, walnuts, almonds, and seeds.  Eat more omega-3 fats. Good choices include salmon, mackerel, sardines, tuna, flaxseed oil, and ground flaxseeds. Aim to eat fish at least two times each week.  Avoid foods with partially hydrogenated oils in them. These contain trans fats. Examples of foods that contain trans fats are stick margarine, some tub margarines, cookies, crackers, and other baked goods. If you are going to avoid a fat, this is the one to avoid!  WHAT GENERAL GUIDELINES DO I NEED TO FOLLOW?  Check food labels carefully to identify foods with trans fats. Avoid these types of options when possible.  Fill one half of your plate with vegetables and green salads. Eat 4-5 servings of vegetables per day. A serving of vegetables equals 1 cup of raw leafy vegetables,  cup of raw or cooked cut-up vegetables, or  cup of vegetable juice.  Fill one fourth of your plate with whole grains. Look for the word  "whole" as the first word in the ingredient list.  Fill one fourth of your plate with lean protein foods.  Eat 4-5 servings of fruit per day. A serving of fruit equals one medium whole fruit,  cup of dried fruit,  cup of fresh, frozen, or canned fruit. Try to avoid fruits in cups/syrups as the sugar content can be high.  Eat more foods that contain soluble fiber. Examples of foods that contain this type of fiber are apples, broccoli, carrots, beans, peas, and barley. Aim to get 20-30 g of fiber per day.  Eat more home-cooked food and less restaurant, buffet, and fast food.  Limit or avoid alcohol.  Limit foods that are high in starch and sugar.  Avoid fried foods when able.  Cook foods by using methods other than frying. Baking, boiling, grilling, and broiling are all great options. Other fat-reducing suggestions include: ? Removing the skin from poultry. ? Removing all visible fats from meats. ? Skimming the fat off of stews, soups, and gravies before serving them. ? Steaming vegetables in water or broth.  Lose weight if you are overweight. Losing just 5-10% of your initial body weight can help your overall health and prevent diseases such as diabetes and heart disease.  Increase your consumption of nuts, legumes, and seeds to 4-5 servings per week. One serving of dried beans or legumes equals  cup after being cooked, one serving of nuts equals 1 ounces, and one serving of seeds equals  ounce or 1 tablespoon.  WHAT ARE GOOD FOODS CAN I EAT? Grains  Grainy breads (try to find bread that is 3 g of fiber per slice or greater), oatmeal, light popcorn. Whole-grain cereals. Rice and pasta, including brown rice and those that are made with whole wheat. Edamame pasta is a great alternative to grain pasta. It has a higher protein content. Try to avoid significant consumption of white bread, sugary cereals, or pastries/baked goods.  Vegetables All vegetables. Cooked white potatoes do not  count as vegetables.  Fruits All fruits, but limit pineapple and bananas as these fruits have a higher sugar content.  Meats and Other Protein Sources Lean, well-trimmed beef, veal, pork, and lamb. Chicken and [place] without skin. All fish and shellfish. Wild duck, rabbit, pheasant, and venison. Egg whites or low-cholesterol egg substitutes. Dried beans, peas, lentils, and tofu.Seeds and most nuts.  Dairy Low-fat or nonfat cheeses, including ricotta, string, and mozzarella. Skim or 1% milk that is liquid, powdered, or evaporated. Buttermilk that is made with low-fat milk. Nonfat or low-fat yogurt. Soy/Almond milk are good alternatives if you cannot handle dairy.  Beverages Water is the best for you. Sports drinks with less sugar are more desirable unless you are a highly active athlete.  Sweets and Desserts Sherbets and fruit ices. Honey, jam, marmalade, jelly, and syrups. Dark chocolate.  Eat all sweets and desserts in moderation.  Fats and Oils Nonhydrogenated (trans-free) margarines. Vegetable oils, including soybean, sesame, sunflower, olive, peanut, safflower, corn, canola, and cottonseed. Salad dressings or mayonnaise that are made with a vegetable oil. Limit added fats and oils that you use for cooking, baking, salads, and as spreads.  Other Cocoa powder. Coffee and tea. Most condiments.  The items listed above may not be a complete list of recommended foods or beverages. Contact your dietitian for more options.

## 2017-03-03 ENCOUNTER — Telehealth: Payer: Self-pay | Admitting: Family Medicine

## 2017-03-03 NOTE — Telephone Encounter (Signed)
Copied from CRM [id number]. Topic: General - Other >> Mar 03, 2017  2:22 PM Oneal GroutSebastian, Jennifer S wrote: Reason for CRM: Would like to speak with nurse regarding hernia, is the hernia work related?

## 2017-03-04 ENCOUNTER — Ambulatory Visit: Payer: 59 | Admitting: Family Medicine

## 2017-03-04 NOTE — Telephone Encounter (Signed)
Tough to definitively say. I know she does some lifting and that could have precipitated it as we had discussed in the appt. TY.

## 2017-03-04 NOTE — Telephone Encounter (Signed)
Called left message to call back 

## 2017-03-04 NOTE — Telephone Encounter (Signed)
Reiterated below response from Provider. Pt states her employer is wanting to know. Pt has upcoming appt with the surgeon and will also discuss this with him to see if she can get further clarification.

## 2017-03-26 ENCOUNTER — Ambulatory Visit: Payer: 59 | Admitting: Internal Medicine

## 2017-04-27 ENCOUNTER — Ambulatory Visit: Payer: Self-pay
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
## 2017-04-27 NOTE — Telephone Encounter (Signed)
FYI to PCP

## 2017-04-28 NOTE — Telephone Encounter (Signed)
Do not see where pt. Followed up at ED.

## 2017-04-29 NOTE — Telephone Encounter (Signed)
I would like to see if the pt went to ED and how she is doing. I think seeing her for a visit is a good idea today or before the weekend. Reinforce rec to go to ER if she is actively suicidal or if she has thoughts of harming self or others. TY.

## 2017-04-29 NOTE — Telephone Encounter (Signed)
Called the patient and she states she is doing a little better today, but still struggling.  I offered her our open appt for this morning, but she is working and could not come in and no openings in the afternoon.  Scheduled her tomorrow 04/30/17 afternoon.  She did promise me that she would go to the ED if she gets worse again.  She is concerned though about work and knows they may keep her if she goes to the hospital, she has to work.

## 2017-04-30 ENCOUNTER — Ambulatory Visit: Payer: 59 | Admitting: Family Medicine

## 2017-04-30 ENCOUNTER — Encounter: Payer: Self-pay | Admitting: Family Medicine

## 2017-04-30 VITALS — BP 132/82 | HR 103 | Temp 98.7°F | Ht 65.5 in | Wt >= 6400 oz

## 2017-04-30 DIAGNOSIS — F1010 Alcohol abuse, uncomplicated: Secondary | ICD-10-CM | POA: Diagnosis not present

## 2017-04-30 DIAGNOSIS — F339 Major depressive disorder, recurrent, unspecified: Secondary | ICD-10-CM | POA: Diagnosis not present

## 2017-04-30 MED ORDER — CHLORDIAZEPOXIDE HCL 25 MG PO CAPS: 25.0000 mg | ORAL_CAPSULE | Freq: Three times a day (TID) | ORAL | 0 refills | Status: DC | PRN

## 2017-04-30 MED ORDER — BUPROPION HCL ER (XL) 150 MG PO TB24: 150.0000 mg | ORAL_TABLET | Freq: Every day | ORAL | 2 refills | Status: DC

## 2017-04-30 NOTE — Progress Notes (Signed)
Pre visit review using our clinic review tool, if applicable. No additional management support is needed unless otherwise documented below in the visit note. 

## 2017-04-30 NOTE — Patient Instructions (Addendum)
Let's go back on the medicine.  Please consider counseling. Contact [phone] to schedule an appointment or inquire about cost/insurance coverage.  [facility] [address] [place], [place] [address] [phone]  [facility] [address] [place], [place] [address] [phone]  [facility] [address] [place], [place] [address] [phone]  Call one of these offices sooner than later as it can take 2-3 months to get a new patient appointment.   Coping skills Choose 5 that work for you:  Take a deep breath  Count to 20  Read a book  Do a puzzle  Meditate  Bake  Sing  Knit  Garden  Pray  Go outside  Call a friend  Listen to music  Take a walk  Color  Send a note  Take a bath  Watch a movie  Be alone in a quiet place  Pet an animal  Visit a friend  Journal  Exercise  Stretch   Try to avoid alcohol. Start taking the Librium 3 times daily once you stop drinking. Do this for 7 days.
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
## 2017-05-04 ENCOUNTER — Emergency Department (HOSPITAL_COMMUNITY)
Admission: EM | Admit: 2017-05-04 | Discharge: 2017-05-05 | Disposition: A | Discharge status: Other Acute Inpatient Hospital | Payer: 59 | Attending: Emergency Medicine | Admitting: Emergency Medicine

## 2017-05-04 ENCOUNTER — Encounter (HOSPITAL_COMMUNITY): Payer: Self-pay | Admitting: Nurse Practitioner

## 2017-05-04 ENCOUNTER — Other Ambulatory Visit: Payer: Self-pay

## 2017-05-04 DIAGNOSIS — F332 Major depressive disorder, recurrent severe without psychotic features: Secondary | ICD-10-CM | POA: Insufficient documentation

## 2017-05-04 DIAGNOSIS — Z87891 Personal history of nicotine dependence: Secondary | ICD-10-CM | POA: Diagnosis not present

## 2017-05-04 DIAGNOSIS — I10 Essential (primary) hypertension: Secondary | ICD-10-CM | POA: Diagnosis not present

## 2017-05-04 DIAGNOSIS — R45851 Suicidal ideations: Secondary | ICD-10-CM | POA: Insufficient documentation

## 2017-05-04 DIAGNOSIS — Z7984 Long term (current) use of oral hypoglycemic drugs: Secondary | ICD-10-CM | POA: Insufficient documentation

## 2017-05-04 DIAGNOSIS — Z79899 Other long term (current) drug therapy: Secondary | ICD-10-CM | POA: Diagnosis not present

## 2017-05-04 DIAGNOSIS — E119 Type 2 diabetes mellitus without complications: Secondary | ICD-10-CM | POA: Insufficient documentation

## 2017-05-04 DIAGNOSIS — J45909 Unspecified asthma, uncomplicated: Secondary | ICD-10-CM | POA: Diagnosis not present

## 2017-05-04 DIAGNOSIS — F329 Major depressive disorder, single episode, unspecified: Secondary | ICD-10-CM | POA: Diagnosis present

## 2017-05-04 LAB — CBC
HEMATOCRIT: 43.7 % (ref 36.0–46.0)
Hemoglobin: 13.7 g/dL (ref 12.0–15.0)
MCH: 23.9 pg — AB (ref 26.0–34.0)
MCHC: 31.4 g/dL (ref 30.0–36.0)
MCV: 76.3 fL — AB (ref 78.0–100.0)
Platelets: 325 10*3/uL (ref 150–400)
RBC: 5.73 MIL/uL — ABNORMAL HIGH (ref 3.87–5.11)
RDW: 14.9 % (ref 11.5–15.5)
WBC: 5.6 10*3/uL (ref 4.0–10.5)

## 2017-05-04 LAB — URINALYSIS, ROUTINE W REFLEX MICROSCOPIC
BILIRUBIN URINE: NEGATIVE
Glucose, UA: NEGATIVE mg/dL
HGB URINE DIPSTICK: NEGATIVE
KETONES UR: NEGATIVE mg/dL
Leukocytes, UA: NEGATIVE
NITRITE: NEGATIVE
Protein, ur: NEGATIVE mg/dL
Specific Gravity, Urine: 1.008 (ref 1.005–1.030)
pH: 5 (ref 5.0–8.0)

## 2017-05-04 LAB — COMPREHENSIVE METABOLIC PANEL
ALK PHOS: 67 U/L (ref 38–126)
ALT: 18 U/L (ref 14–54)
AST: 16 U/L (ref 15–41)
Albumin: 4.1 g/dL (ref 3.5–5.0)
Anion gap: 11 (ref 5–15)
BILIRUBIN TOTAL: 1 mg/dL (ref 0.3–1.2)
BUN: 11 mg/dL (ref 6–20)
CALCIUM: 9.3 mg/dL (ref 8.9–10.3)
CO2: 26 mmol/L (ref 22–32)
CREATININE: 0.71 mg/dL (ref 0.44–1.00)
Chloride: 101 mmol/L (ref 101–111)
GFR calc Af Amer: >60 mL/min (ref >60)
Glucose, Bld: 184 mg/dL — ABNORMAL HIGH (ref 65–99)
POTASSIUM: 3.4 mmol/L — AB (ref 3.5–5.1)
Sodium: 138 mmol/L (ref 135–145)
TOTAL PROTEIN: 8 g/dL (ref 6.5–8.1)

## 2017-05-04 LAB — RAPID URINE DRUG SCREEN, HOSP PERFORMED
Amphetamines: NOT DETECTED
BARBITURATES: NOT DETECTED
Benzodiazepines: NOT DETECTED
Cocaine: NOT DETECTED
Opiates: NOT DETECTED
Tetrahydrocannabinol: NOT DETECTED

## 2017-05-04 LAB — CBG MONITORING, ED: Glucose-Capillary: 201 mg/dL — ABNORMAL HIGH (ref 65–99)

## 2017-05-04 LAB — SALICYLATE LEVEL: Salicylate Lvl: <7 mg/dL (ref 2.8–30.0)

## 2017-05-04 LAB — ACETAMINOPHEN LEVEL: Acetaminophen (Tylenol), Serum: <10 ug/mL — ABNORMAL LOW (ref 10–30)

## 2017-05-04 LAB — TSH: TSH: 1.224 u[IU]/mL (ref 0.350–4.500)

## 2017-05-04 LAB — I-STAT BETA HCG BLOOD, ED (MC, WL, AP ONLY)

## 2017-05-04 LAB — ETHANOL

## 2017-05-04 MED ORDER — THIAMINE HCL 100 MG/ML IJ SOLN: 100.0000 mg | Freq: Every day | INTRAMUSCULAR | Status: DC

## 2017-05-04 MED ORDER — LORAZEPAM 1 MG PO TABS: 0.0000 mg | ORAL_TABLET | Freq: Four times a day (QID) | ORAL | Status: DC

## 2017-05-04 MED ORDER — LORAZEPAM 2 MG/ML IJ SOLN: 0.0000 mg | Freq: Two times a day (BID) | INTRAMUSCULAR | Status: DC

## 2017-05-04 MED ORDER — LORAZEPAM 2 MG/ML IJ SOLN: 0.0000 mg | Freq: Four times a day (QID) | INTRAMUSCULAR | Status: DC

## 2017-05-04 MED ORDER — METFORMIN HCL 500 MG PO TABS
500.0000 mg | ORAL_TABLET | Freq: Once | ORAL | Status: AC
Start: 2017-05-04 — End: 2017-05-04
  Administered 2017-05-04: 500 mg via ORAL
  Filled 2017-05-04: qty 1

## 2017-05-04 MED ORDER — LORAZEPAM 1 MG PO TABS: 0.0000 mg | ORAL_TABLET | Freq: Two times a day (BID) | ORAL | Status: DC

## 2017-05-04 MED ORDER — LISINOPRIL 10 MG PO TABS
10.0000 mg | ORAL_TABLET | Freq: Every day | ORAL | Status: DC
  Filled 2017-05-04 (×2): qty 1

## 2017-05-04 MED ORDER — METFORMIN HCL 500 MG PO TABS
500.0000 mg | ORAL_TABLET | Freq: Two times a day (BID) | ORAL | Status: DC
  Administered 2017-05-05: 500 mg via ORAL
  Filled 2017-05-04: qty 1

## 2017-05-04 MED ORDER — HYDROCHLOROTHIAZIDE 25 MG PO TABS
25.0000 mg | ORAL_TABLET | Freq: Every day | ORAL | Status: DC
  Filled 2017-05-04: qty 1

## 2017-05-04 MED ORDER — VITAMIN B-1 100 MG PO TABS
100.0000 mg | ORAL_TABLET | Freq: Every day | ORAL | Status: DC
  Administered 2017-05-04: 100 mg via ORAL
  Filled 2017-05-04: qty 1

## 2017-05-04 NOTE — ED Notes (Signed)
Per Delorise Jacksonori, [facility], pt will need to bring own CPAP.  Pt informed & stated "I will try to call a friend but it will probably be tomorrow before they can bring it."

## 2017-05-04 NOTE — ED Provider Notes (Addendum)
[facility] DEPT Provider Note   CSN: [id number] Arrival date & time: 05/04/17  1559     History   Chief Complaint No chief complaint on file.   HPI Laura Benson is a 27 y.o. female.  HPI Patient presents with depression and suicidal thoughts.  Is been seeing a psychiatrist in [facility] and recently started on Wellbutrin 3 days ago.  Now worsening suicidal thoughts.  Plans to run her car off a bridge if she needs to.  Has had suicidal thoughts for this but not as severe.  No hallucinations.  No overdose attempt.  She is diabetic. Past Medical History:  Diagnosis Date  . Asthma   . Depression   . Diabetes mellitus without complication ([place])   . Hypertension   . Morbid obesity ([place])   . Thyroid disease     Patient Active Problem List   Diagnosis Date Noted  . Alcohol abuse 04/30/2017  . Umbilical hernia without obstruction and without gangrene 02/25/2017  . Obstructive sleep apnea 12/11/2016  . Diabetes mellitus without complication ([place]) 63/78/5885  . Hypertension 01/23/2016  . Dysmenorrhea 12/25/2013  . Rectal bleeding 10/20/2012  . Depression, recurrent ([place]) 01/05/2009  . BREAST PAIN, BILATERAL 01/05/2009  . INTERTRIGO, CANDIDAL 01/05/2009  . ABSENCE OF MENSTRUATION 09/20/2008  . DELAYED MENSES 08/25/2008  . ASCUS PAP 05/05/2008  . OBESITY, CLASS III 03/26/2006  . HYPERTENSION, BENIGN SYSTEMIC 03/26/2006    Past Surgical History:  Procedure Laterality Date  . ADENOIDECTOMY    . TONSILLECTOMY       OB History    Gravida  0   Para      Term      Preterm      AB      Living        SAB      TAB      Ectopic      Multiple      Live Births               Home Medications    Prior to Admission medications   Medication Sig Start Date End Date Taking? Authorizing Provider  buPROPion (WELLBUTRIN XL) 150 MG 24 hr tablet Take 1 tablet (150 mg total) by mouth daily. 04/30/17  Yes Shelda Pal, DO    chlordiazePOXIDE (LIBRIUM) 25 MG capsule Take 1 capsule (25 mg total) by mouth 3 (three) times daily as needed for withdrawal. 04/30/17  Yes Wendling, Crosby Oyster, DO  hydrochlorothiazide (HYDRODIURIL) 25 MG tablet Take 1 tablet (25 mg total) by mouth daily. 10/01/16  Yes Billie Ruddy, MD  ibuprofen (ADVIL,MOTRIN) 200 MG tablet Take 400 mg by mouth daily as needed for headache (migraine).    Yes [provider]  metFORMIN (GLUCOPHAGE) 500 MG tablet Take 1 tablet (500 mg total) by mouth 2 (two) times daily with a meal. 01/22/17  Yes Billie Ruddy, MD  albuterol (PROVENTIL HFA;VENTOLIN HFA) 108 (90 Base) MCG/ACT inhaler Inhale 2 puffs every 6 (six) hours as needed into the lungs for wheezing or shortness of breath. Patient not taking: Reported on 05/04/2017 12/12/16   Billie Ruddy, MD  blood glucose meter kit and supplies KIT Dispense based on patient and insurance preference. Use up to four times daily as directed. (FOR ICD-9 250.00, 250.01). 10/15/16   Billie Ruddy, MD  Blood Pressure Monitoring (BLOOD PRESSURE MONITOR/L CUFF) MISC Check your blood pressure daily. 10/15/16   Billie Ruddy, MD  dicyclomine (BENTYL) 20  MG tablet Take 1 tablet (20 mg total) by mouth 2 (two) times daily. Patient not taking: Reported on 05/04/2017 02/21/17   Ashley Murrain, NP  glucose blood (TRUE METRIX BLOOD GLUCOSE TEST) test strip Use as instructed 01/23/16   Alfonse Spruce, FNP  lisinopril (PRINIVIL,ZESTRIL) 10 MG tablet Take 1 tablet (10 mg total) by mouth daily. Patient not taking: Reported on 05/04/2017 01/23/16   Alfonse Spruce, [place] Patient is to test up to four times daily. Dx E11.9 11/05/16   Billie Ruddy, MD    Family History Family History  Problem Relation Age of Onset  . Hypertension Mother   . Diabetes Mother   . Colon cancer Father   . Hypertension Maternal Grandmother   . Hypertension Paternal Grandmother   . Diabetes Paternal  Grandmother   . Breast cancer Cousin     Social History Social History   Tobacco Use  . Smoking status: Former Smoker    Types: Cigarettes  . Smokeless tobacco: Never Used  Substance Use Topics  . Alcohol use: No  . Drug use: No     Allergies   Patient has no known allergies.   Review of Systems Review of Systems  Constitutional: Negative for appetite change.  HENT: Negative for congestion.   Respiratory: Negative for shortness of breath.   Cardiovascular: Negative for chest pain.  Gastrointestinal: Negative for abdominal pain.  Genitourinary: Negative for flank pain.  Musculoskeletal: Negative for back pain.  Skin: Negative for rash.  Neurological: Negative for seizures.  Hematological: Negative for adenopathy.  Psychiatric/Behavioral: Positive for suicidal ideas.     Physical Exam Updated Vital Signs BP (!) 154/94 (BP Location: Right Arm)   Pulse 87   Temp 98.4 F (36.9 C) (Oral)   Resp 18   Ht 5' 5.5" (1.664 m)   Wt (!) 216.4 kg (477 lb)   SpO2 97%   BMI 78.17 kg/m   Physical Exam  Constitutional: She appears well-developed.  Patient is morbidly obese  HENT:  Head: Normocephalic.  Eyes: Pupils are equal, round, and reactive to light.  Cardiovascular: Normal rate.  Pulmonary/Chest: Effort normal.  Abdominal: There is no tenderness.  Musculoskeletal: She exhibits no tenderness.  Neurological: She is alert.  Skin: Skin is warm. Capillary refill takes less than 2 seconds.  Psychiatric: She has a normal mood and affect.     ED Treatments / Results  Labs (all labs ordered are listed, but only abnormal results are displayed) Labs Reviewed  COMPREHENSIVE METABOLIC PANEL - Abnormal; Notable for the following components:      Result Value   Potassium 3.4 (*)    Glucose, Bld 184 (*)    All other components within normal limits  ACETAMINOPHEN LEVEL - Abnormal; Notable for the following components:   Acetaminophen (Tylenol), Serum <10 (*)    All  other components within normal limits  CBC - Abnormal; Notable for the following components:   RBC 5.73 (*)    MCV 76.3 (*)    MCH 23.9 (*)    All other components within normal limits  URINALYSIS, ROUTINE W REFLEX MICROSCOPIC - Abnormal; Notable for the following components:   Color, Urine STRAW (*)    All other components within normal limits  ETHANOL  SALICYLATE LEVEL  RAPID URINE DRUG SCREEN, HOSP PERFORMED  TSH  I-STAT BETA HCG BLOOD, ED ([facility], [facility], [facility] ONLY)  CBG MONITORING, ED    EKG None  Radiology No results  found.  Procedures Procedures (including critical care time)  Medications Ordered in ED Medications  hydrochlorothiazide (HYDRODIURIL) tablet 25 mg (has no administration in time range)  metFORMIN (GLUCOPHAGE) tablet 500 mg (has no administration in time range)  LORazepam (ATIVAN) injection 0-4 mg (0 mg Intravenous Not Given 05/04/17 2040)    Or  LORazepam (ATIVAN) tablet 0-4 mg ( Oral See Alternative 05/04/17 2040)  LORazepam (ATIVAN) injection 0-4 mg (has no administration in time range)    Or  LORazepam (ATIVAN) tablet 0-4 mg (has no administration in time range)  thiamine (VITAMIN B-1) tablet 100 mg (100 mg Oral Given 05/04/17 2046)    Or  thiamine (B-1) injection 100 mg ( Intravenous See Alternative 05/04/17 2046)  lisinopril (PRINIVIL,ZESTRIL) tablet 10 mg (has no administration in time range)  metFORMIN (GLUCOPHAGE) tablet 500 mg (500 mg Oral Given 05/04/17 2055)     Initial Impression / Assessment and Plan / ED Course  I have reviewed the triage vital signs and the nursing notes.  Pertinent labs & imaging results that were available during my care of the patient were reviewed by me and considered in my medical decision making (see chart for details).     Patient presents with depression and suicidal thoughts.  Suicidal thoughts been worsening.  Recently started on Wellbutrin.  Take a clear at this time.  To be seen by TTS.  Patient is been accepted at  behavioral health by Dr. Parke Poisson.  Final Clinical Impressions(s) / ED Diagnoses   Final diagnoses:  Suicidal ideations    ED Discharge Orders    None       Davonna Belling, MD 05/04/17 Greer Ee    Davonna Belling, MD 05/04/17 2221

## 2017-05-04 NOTE — ED Notes (Signed)
Pt haas been very pleasant during my shift. Pt is very cooperative and is resting. Pt ate all her supper. Pt ambulates very back and forth to the toilet without supervision.

## 2017-05-04 NOTE — ED Notes (Signed)
Dr. Rubin PayorPickering informed pt has CBG's ordered QID and uses a CPAP @ night.

## 2017-05-04 NOTE — BH Assessment (Addendum)
BHH Assessment Progress Note   [facility] to Dr. Jama Flavorsobos.  Clinician informed Dr. Rubin PayorPickering.  Also let NT Carmen know.  [facility]00 [facility].  Pt has a friend that will bring her C-PAP tomorrow.

## 2017-05-04 NOTE — ED Notes (Signed)
Informed Tori, [facility], pt is having a friend come from [place] to get her keys, go back to [place] and then back here with CPAP.  Per Delorise Jacksonori, will hold pt's bed for tomorrow.  Pt informed.
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
## 2017-05-04 NOTE — ED Notes (Signed)
Pt was given her personal belongings to obtain keys & money.  Pt gave this writer keys & $10 to give to friend that is waiting at the front.

## 2017-05-04 NOTE — ED Notes (Signed)
TTS assessment in progress. 

## 2017-05-04 NOTE — ED Notes (Signed)
Pt's friend picked up keys & $10 to go pick up her CPAP and return it to the hospital.

## 2017-05-04 NOTE — ED Triage Notes (Signed)
Patient here for increased thought of SI. Patient was placed on a medication Friday for the depression and states she feels like the SI thoughts are getting worse. Patient states she has a place to run her car off a bridge. Denies HI

## 2017-05-05 ENCOUNTER — Encounter (HOSPITAL_COMMUNITY): Payer: Self-pay | Admitting: Nurse Practitioner

## 2017-05-05 ENCOUNTER — Inpatient Hospital Stay (HOSPITAL_COMMUNITY)
Admission: AD | Admit: 2017-05-05 | Discharge: 2017-05-10 | DRG: 885 | Disposition: A | Discharge status: Home / Self Care | Payer: 59 | Source: Intra-hospital | Attending: Psychiatry | Admitting: Psychiatry

## 2017-05-05 DIAGNOSIS — Z803 Family history of malignant neoplasm of breast: Secondary | ICD-10-CM | POA: Diagnosis not present

## 2017-05-05 DIAGNOSIS — I10 Essential (primary) hypertension: Secondary | ICD-10-CM | POA: Diagnosis present

## 2017-05-05 DIAGNOSIS — G4733 Obstructive sleep apnea (adult) (pediatric): Secondary | ICD-10-CM | POA: Diagnosis present

## 2017-05-05 DIAGNOSIS — Z6841 Body Mass Index (BMI) 40.0 and over, adult: Secondary | ICD-10-CM

## 2017-05-05 DIAGNOSIS — Z87891 Personal history of nicotine dependence: Secondary | ICD-10-CM | POA: Diagnosis not present

## 2017-05-05 DIAGNOSIS — E1165 Type 2 diabetes mellitus with hyperglycemia: Secondary | ICD-10-CM | POA: Diagnosis present

## 2017-05-05 DIAGNOSIS — F1010 Alcohol abuse, uncomplicated: Secondary | ICD-10-CM | POA: Diagnosis present

## 2017-05-05 DIAGNOSIS — F419 Anxiety disorder, unspecified: Secondary | ICD-10-CM

## 2017-05-05 DIAGNOSIS — J45909 Unspecified asthma, uncomplicated: Secondary | ICD-10-CM | POA: Diagnosis present

## 2017-05-05 DIAGNOSIS — Z791 Long term (current) use of non-steroidal anti-inflammatories (NSAID): Secondary | ICD-10-CM | POA: Diagnosis not present

## 2017-05-05 DIAGNOSIS — R450 Nervousness: Secondary | ICD-10-CM | POA: Diagnosis not present

## 2017-05-05 DIAGNOSIS — F339 Major depressive disorder, recurrent, unspecified: Secondary | ICD-10-CM

## 2017-05-05 DIAGNOSIS — G4700 Insomnia, unspecified: Secondary | ICD-10-CM | POA: Diagnosis present

## 2017-05-05 DIAGNOSIS — Z800 Family history of malignant neoplasm of digestive organs: Secondary | ICD-10-CM

## 2017-05-05 DIAGNOSIS — F332 Major depressive disorder, recurrent severe without psychotic features: Principal | ICD-10-CM | POA: Diagnosis present

## 2017-05-05 DIAGNOSIS — Z62810 Personal history of physical and sexual abuse in childhood: Secondary | ICD-10-CM | POA: Diagnosis present

## 2017-05-05 DIAGNOSIS — Z6379 Other stressful life events affecting family and household: Secondary | ICD-10-CM

## 2017-05-05 DIAGNOSIS — F1290 Cannabis use, unspecified, uncomplicated: Secondary | ICD-10-CM | POA: Diagnosis present

## 2017-05-05 DIAGNOSIS — E119 Type 2 diabetes mellitus without complications: Secondary | ICD-10-CM

## 2017-05-05 DIAGNOSIS — Z79899 Other long term (current) drug therapy: Secondary | ICD-10-CM

## 2017-05-05 DIAGNOSIS — F4310 Post-traumatic stress disorder, unspecified: Secondary | ICD-10-CM | POA: Diagnosis present

## 2017-05-05 DIAGNOSIS — R45851 Suicidal ideations: Secondary | ICD-10-CM | POA: Diagnosis present

## 2017-05-05 DIAGNOSIS — Z7984 Long term (current) use of oral hypoglycemic drugs: Secondary | ICD-10-CM | POA: Diagnosis not present

## 2017-05-05 DIAGNOSIS — N3289 Other specified disorders of bladder: Secondary | ICD-10-CM | POA: Diagnosis present

## 2017-05-05 DIAGNOSIS — Z833 Family history of diabetes mellitus: Secondary | ICD-10-CM | POA: Diagnosis not present

## 2017-05-05 DIAGNOSIS — R000 Tachycardia, unspecified: Secondary | ICD-10-CM | POA: Diagnosis not present

## 2017-05-05 DIAGNOSIS — Z8249 Family history of ischemic heart disease and other diseases of the circulatory system: Secondary | ICD-10-CM | POA: Diagnosis not present

## 2017-05-05 DIAGNOSIS — F322 Major depressive disorder, single episode, severe without psychotic features: Secondary | ICD-10-CM | POA: Diagnosis not present

## 2017-05-05 DIAGNOSIS — F515 Nightmare disorder: Secondary | ICD-10-CM | POA: Diagnosis present

## 2017-05-05 HISTORY — DX: Sleep apnea, unspecified: G47.30

## 2017-05-05 LAB — GLUCOSE, CAPILLARY: GLUCOSE-CAPILLARY: 144 mg/dL — AB (ref 65–99)

## 2017-05-05 LAB — PREGNANCY, URINE: PREG TEST UR: NEGATIVE

## 2017-05-05 LAB — CBG MONITORING, ED: Glucose-Capillary: 109 mg/dL — ABNORMAL HIGH (ref 65–99)

## 2017-05-05 MED ORDER — ACETAMINOPHEN 325 MG PO TABS
650.0000 mg | ORAL_TABLET | Freq: Four times a day (QID) | ORAL | Status: DC | PRN
  Administered 2017-05-10: 650 mg via ORAL
  Filled 2017-05-05: qty 2

## 2017-05-05 MED ORDER — POTASSIUM CITRATE ER 10 MEQ (1080 MG) PO TBCR: 10.0000 meq | EXTENDED_RELEASE_TABLET | Freq: Every day | ORAL | Status: DC

## 2017-05-05 MED ORDER — POTASSIUM CHLORIDE CRYS ER 20 MEQ PO TBCR
20.0000 meq | EXTENDED_RELEASE_TABLET | Freq: Once | ORAL | Status: AC
  Administered 2017-05-05: 20 meq via ORAL
  Filled 2017-05-05: qty 1

## 2017-05-05 MED ORDER — PRAZOSIN HCL 1 MG PO CAPS
1.0000 mg | ORAL_CAPSULE | Freq: Every day | ORAL | Status: DC
  Administered 2017-05-05 – 2017-05-06 (×2): 1 mg via ORAL
  Filled 2017-05-05 (×4): qty 1

## 2017-05-05 MED ORDER — HYDROXYZINE HCL 25 MG PO TABS
25.0000 mg | ORAL_TABLET | Freq: Three times a day (TID) | ORAL | Status: DC | PRN
  Administered 2017-05-07 – 2017-05-10 (×5): 25 mg via ORAL
  Filled 2017-05-05 (×5): qty 1

## 2017-05-05 MED ORDER — LORAZEPAM 1 MG PO TABS: 1.0000 mg | ORAL_TABLET | Freq: Four times a day (QID) | ORAL | Status: DC | PRN

## 2017-05-05 MED ORDER — ALUM & MAG HYDROXIDE-SIMETH 200-200-20 MG/5ML PO SUSP: 30.0000 mL | ORAL | Status: DC | PRN

## 2017-05-05 MED ORDER — TRAZODONE HCL 50 MG PO TABS
50.0000 mg | ORAL_TABLET | Freq: Every evening | ORAL | Status: DC | PRN
  Administered 2017-05-05 – 2017-05-06 (×2): 50 mg via ORAL
  Filled 2017-05-05: qty 1

## 2017-05-05 MED ORDER — MAGNESIUM HYDROXIDE 400 MG/5ML PO SUSP: 30.0000 mL | Freq: Every day | ORAL | Status: DC | PRN

## 2017-05-05 MED ORDER — METFORMIN HCL 500 MG PO TABS
500.0000 mg | ORAL_TABLET | Freq: Two times a day (BID) | ORAL | Status: DC
  Administered 2017-05-05 – 2017-05-07 (×4): 500 mg via ORAL
  Filled 2017-05-05 (×8): qty 1

## 2017-05-05 MED ORDER — SERTRALINE HCL 25 MG PO TABS
25.0000 mg | ORAL_TABLET | Freq: Every day | ORAL | Status: DC
  Administered 2017-05-05 – 2017-05-07 (×3): 25 mg via ORAL
  Filled 2017-05-05 (×5): qty 1

## 2017-05-05 MED ORDER — HYDROCHLOROTHIAZIDE 25 MG PO TABS
25.0000 mg | ORAL_TABLET | Freq: Every day | ORAL | Status: DC
  Administered 2017-05-05 – 2017-05-10 (×6): 25 mg via ORAL
  Filled 2017-05-05 (×9): qty 1

## 2017-05-05 MED ORDER — BUPROPION HCL ER (XL) 150 MG PO TB24
150.0000 mg | ORAL_TABLET | Freq: Every day | ORAL | Status: DC
  Administered 2017-05-05 – 2017-05-07 (×3): 150 mg via ORAL
  Filled 2017-05-05 (×5): qty 1

## 2017-05-05 NOTE — ED Notes (Signed)
Pt's wearing CPAP.

## 2017-05-05 NOTE — ED Notes (Signed)
[facility] [place] [facility]

## 2017-05-05 NOTE — Progress Notes (Signed)
CSW received call from pt mother, Jonni SangerMeg Hawkins, [phone].  CSW asked pt to sign release and pt declined saying she would contact her mother later. Garner NashGregory Nickalous Stingley, MSW, LCSW Clinical Social Worker 05/05/2017 3:52 PM

## 2017-05-05 NOTE — Progress Notes (Signed)
The patient attended group but refused to share.  

## 2017-05-05 NOTE — ED Notes (Signed)
Pt's friend returned with pt's CPAP machine.  Pt confirmed all parts are onsite.  CPAP placed in locker #29 & pt's keys returned to personal belonging bag.  Informed Tori, [facility] AC.  Per Delorise Jacksonori, [facility] @ [facility]800.  Bed assignment if 402-2.

## 2017-05-05 NOTE — BHH Group Notes (Signed)
  B[facility] LCSW Group Therapy Note  Date/Time: 05/05/17, 1315  Type of Therapy/Topic:  Group Therapy:  Emotion Regulation  Participation Level:  Did Not Attend   Mood:  Description of Group:    The purpose of this group is to assist patients in learning to regulate negative emotions and experience positive emotions. Patients will be guided to discuss ways in which they have been vulnerable to their negative emotions. These vulnerabilities will be juxtaposed with experiences of positive emotions or situations, and patients challenged to use positive emotions to combat negative ones. Special emphasis will be placed on coping with negative emotions in conflict situations, and patients will process healthy conflict resolution skills.  Therapeutic Goals: 1. Patient will identify two positive emotions or experiences to reflect on in order to balance out negative emotions:  2. Patient will label two or more emotions that they find the most difficult to experience:  3. Patient will be able to demonstrate positive conflict resolution skills through discussion or role plays:   Summary of Patient Progress:       Therapeutic Modalities:   Cognitive Behavioral Therapy Feelings Identification Dialectical Behavioral Therapy  Daleen SquibbGreg Estell Dillinger, LCSW

## 2017-05-05 NOTE — Progress Notes (Signed)
Recreation Therapy Notes   Animal-Assisted Activity (AAA) Program Checklist/Progress Notes Patient Eligibility Criteria Checklist & Daily Group note for Rec Tx Intervention Date: 4.9.19. Time: 2:45 p.m.  [facility]   AAA/T Program Assumption of Risk Form signed by Patient/ or Parent Legal Guardian Yes  Patient is free of allergies or sever asthma Yes  Patient reports no fear of animals Yes  Patient reports no history of cruelty to animals Yes  Patient understands his/her participation is voluntary Yes  Patient washes hands before animal contact Yes  Patient washes hands after animal contact Yes  Education: Hand Washing, Appropriate Animal Interaction   Education Outcome: Acknowledges Education.   Clinical Observations/Feedback: Patient did not attend   Sheryle Hailarian Klaus Casteneda, Recreation Therapy Intern   Sheryle HailDarian Vashon Riordan 05/05/2017 2:01 PM
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
## 2017-05-05 NOTE — BHH Group Notes (Signed)
Pt did not attend recreation at 4pm.

## 2017-05-05 NOTE — B[facility] Group Notes (Signed)
[facility] Group Notes:  (Nursing/MHT/Case Management/Adjunct)  Date:  05/05/2017  Time:  3:45 pm  Type of Therapy:  Nurse Education  Participation Level:  Did not attend.  Participation Quality:    Affect:    Cognitive:    Insight:    Engagement in Group:    Modes of Intervention:    Summary of Progress/Problems:  Earline MayotteKnight, Mitsuko Luera Shephard 05/05/2017, 5:56 PM

## 2017-05-05 NOTE — Progress Notes (Signed)
Assumed care of patient from admitting nurse, Tyler Aasoris, RN. Patient has been withdrawn, sad and isolative. Affect is depressed with congruent mood. Denies pain, physical problems. Patient has refused to go to meals stating, "the metformin messes up my tummy."   Patient given emotional support. Orders received and administered along with education. VS monitored and stable. Encouraged milieu interaction and programming participation.   Patient verbalizes understanding and remains safe on level III obs. Denies SI/HI/AVH.

## 2017-05-05 NOTE — Progress Notes (Signed)
Patient given urine cup with instructions. Requested hat which was provided. Patient verbalized understanding.
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
## 2017-05-06 LAB — HEMOGLOBIN A1C
HEMOGLOBIN A1C: 9.2 % — AB (ref 4.8–5.6)
MEAN PLASMA GLUCOSE: 217.34 mg/dL

## 2017-05-06 LAB — GLUCOSE, CAPILLARY: Glucose-Capillary: 162 mg/dL — ABNORMAL HIGH (ref 65–99)

## 2017-05-06 NOTE — Tx Team (Signed)
Initial Treatment Plan 05/06/2017 3:54 AM Laura RossettiKimberly Doolan WUJ:[id number]:[id number]    PATIENT STRESSORS: Health problems Marital or family conflict Medication change or noncompliance   PATIENT STRENGTHS: Average or above average intelligence Capable of independent living General fund of knowledge   PATIENT IDENTIFIED PROBLEMS: "Help with depression"  "I want to not have bad thoughts"                   DISCHARGE CRITERIA:  Improved stabilization in mood, thinking, and/or behavior Verbal commitment to aftercare and medication compliance  PRELIMINARY DISCHARGE PLAN: Attend aftercare/continuing care group Return to previous work or school arrangements  PATIENT/FAMILY INVOLVEMENT: This treatment plan has been presented to and reviewed with the patient, Laura Benson, and/or family member, .  The patient and family have been given the opportunity to ask questions and make suggestions.  Andrena Mewsuttall, Dreon Pineda J, RN 05/06/2017, 3:54 AM

## 2017-05-06 NOTE — Plan of Care (Signed)
Patient verbalizes understanding of information, education provided. 

## 2017-05-06 NOTE — Progress Notes (Signed)
D    Pt appears depressed and sad    She is not very forthcoming with information   She spent a lot of time on the phone this evening   Her interactions with others are minimal and she does not initiate conversation with others A   Verbal support given   Medications administered and effectiveness monitored   Q 15 min checks   R   Pt is safe at present time

## 2017-05-06 NOTE — Progress Notes (Addendum)
[facility] MD Progress Note  05/06/2017 1:45 PM Laura Benson  MRN:  [id number] Subjective: Patient is seen and examined.  Patient was seen in treatment team today with nursing as well as social work.  Patient is a 27 year old female with a past psychiatric history significant for depression as well as posttraumatic stress disorder.  She is seen in follow-up.  She stated she is doing a little bit better.  She stated her thoughts of self-harm continue to come and go.  This is less than they had been.  She said her mood was a little bit better.  She is getting outside of her room more frequently.  She slept well.  She stated that the nightmares and flashbacks were decreased with the prazosin.  She denied any side effects to her current medications. Principal Problem: <principal problem not specified> Diagnosis:   Patient Active Problem List   Diagnosis Date Noted  . MDD (major depressive disorder), severe (HCC) [F32.2] 05/05/2017  . Alcohol abuse [F10.10] 04/30/2017  . Umbilical hernia without obstruction and without gangrene [K42.9] 02/25/2017  . Obstructive sleep apnea [G47.33] 12/11/2016  . Diabetes mellitus without complication (HCC) [E11.9] 01/23/2016  . Hypertension [I10] 01/23/2016  . Dysmenorrhea [N94.6] 12/25/2013  . Rectal bleeding [K62.5] 10/20/2012  . Depression, recurrent (HCC) [F33.9] 01/05/2009  . BREAST PAIN, BILATERAL [N64.4] 01/05/2009  . INTERTRIGO, CANDIDAL [L53.8] 01/05/2009  . ABSENCE OF MENSTRUATION [N91.2] 09/20/2008  . DELAYED MENSES [N94.9] 08/25/2008  . ASCUS PAP [R87.610] 05/05/2008  . OBESITY, CLASS III [E66.01] 03/26/2006  . HYPERTENSION, BENIGN SYSTEMIC [I10] 03/26/2006   Total Time spent with patient: 20 minutes  Past Psychiatric History: See admission H&P  Past Medical History:  Past Medical History:  Diagnosis Date  . Asthma   . Depression   . Diabetes mellitus without complication (HCC)   . Hypertension   . Morbid obesity (HCC)   . Sleep apnea   .  Thyroid disease     Past Surgical History:  Procedure Laterality Date  . ADENOIDECTOMY    . TONSILLECTOMY     Family History:  Family History  Problem Relation Age of Onset  . Hypertension Mother   . Diabetes Mother   . Colon cancer Father   . Hypertension Maternal Grandmother   . Hypertension Paternal Grandmother   . Diabetes Paternal Grandmother   . Breast cancer Cousin    Family Psychiatric  History: See admission H&P Social History:  Social History   Substance and Sexual Activity  Alcohol Use No     Social History   Substance and Sexual Activity  Drug Use No    Social History   Socioeconomic History  . Marital status: Single    Spouse name: Not on file  . Number of children: 0  . Years of education: Not on file  . Highest education level: Not on file  Occupational History    Employer: [employer]  Social Needs  . Financial resource strain: Not on file  . Food insecurity:    Worry: Not on file    Inability: Not on file  . Transportation needs:    Medical: Not on file    Non-medical: Not on file  Tobacco Use  . Smoking status: Former Smoker    Types: Cigarettes  . Smokeless tobacco: Never Used  Substance and Sexual Activity  . Alcohol use: No  . Drug use: No  . Sexual activity: Yes    Birth control/protection: None    Comment: No form of birth control used.  Lifestyle  . Physical activity:    Days per week: Not on file    Minutes per session: Not on file  . Stress: Not on file  Relationships  . Social connections:    Talks on phone: Not on file    Gets together: Not on file    Attends religious service: Not on file    Active member of club or organization: Not on file    Attends meetings of clubs or organizations: Not on file    Relationship status: Not on file  Other Topics Concern  . Not on file  Social History Narrative  . Not on file   Additional Social History:                         Sleep: Good  Appetite:  Good  Current  Medications: Current Facility-Administered Medications  Medication Dose Route Frequency Provider Last Rate Last Dose  . acetaminophen (TYLENOL) tablet 650 mg  650 mg Oral Q6H PRN Money, Gerlene Burdock, FNP      . alum & mag hydroxide-simeth (MAALOX/MYLANTA) 200-200-20 MG/5ML suspension 30 mL  30 mL Oral Q4H PRN Money, Feliz Beam B, FNP      . buPROPion (WELLBUTRIN XL) 24 hr tablet 150 mg  150 mg Oral Daily Antonieta Pert, MD   150 mg at 05/06/17 0745  . hydrochlorothiazide (HYDRODIURIL) tablet 25 mg  25 mg Oral Daily Antonieta Pert, MD   25 mg at 05/06/17 0745  . hydrOXYzine (ATARAX/VISTARIL) tablet 25 mg  25 mg Oral TID PRN Money, Gerlene Burdock, FNP      . LORazepam (ATIVAN) tablet 1 mg  1 mg Oral Q6H PRN Antonieta Pert, MD      . magnesium hydroxide (MILK OF MAGNESIA) suspension 30 mL  30 mL Oral Daily PRN Money, Feliz Beam B, FNP      . metFORMIN (GLUCOPHAGE) tablet 500 mg  500 mg Oral BID WC Money, Feliz Beam B, FNP   500 mg at 05/06/17 0745  . prazosin (MINIPRESS) capsule 1 mg  1 mg Oral QHS Antonieta Pert, MD   1 mg at 05/05/17 2227  . sertraline (ZOLOFT) tablet 25 mg  25 mg Oral Daily Antonieta Pert, MD   25 mg at 05/06/17 0745  . traZODone (DESYREL) tablet 50 mg  50 mg Oral QHS PRN Money, Gerlene Burdock, FNP   50 mg at 05/05/17 2227    Lab Results:  Results for orders placed or performed during the hospital encounter of 05/05/17 (from the past 48 hour(s))  Pregnancy, urine     Status: None   Collection Time: 05/05/17 10:29 AM  Result Value Ref Range   Preg Test, Ur NEGATIVE NEGATIVE    Comment:        THE SENSITIVITY OF THIS METHODOLOGY IS >20 mIU/mL. Performed at [facility], [address], [place], [place] [address]   Glucose, capillary     Status: Abnormal   Collection Time: 05/05/17 12:11 PM  Result Value Ref Range   Glucose-Capillary 144 (H) 65 - 99 mg/dL   Comment 1 Notify RN    Comment 2 Document in Chart   Glucose, capillary     Status: Abnormal    Collection Time: 05/06/17  5:48 AM  Result Value Ref Range   Glucose-Capillary 162 (H) 65 - 99 mg/dL   Comment 1 Notify RN   Hemoglobin A1c     Status: Abnormal   Collection Time:  05/06/17  6:34 AM  Result Value Ref Range   Hgb A1c MFr Bld 9.2 (H) 4.8 - 5.6 %    Comment: (NOTE) Pre diabetes:          5.7%-6.4% Diabetes:              >6.4% Glycemic control for   <7.0% adults with diabetes    Mean Plasma Glucose 217.34 mg/dL    Comment: Performed at [facility], [address] [place], [place] [id number]    Blood Alcohol level:  Lab Results  Component Value Date   ETH <10 05/04/2017    Metabolic Disorder Labs: Lab Results  Component Value Date   HGBA1C 9.2 (H) 05/06/2017   MPG 217.34 05/06/2017   MPG 217 (H) 06/28/2013   No results found for: PROLACTIN Lab Results  Component Value Date   CHOL 192 10/01/2016   TRIG 102.0 10/01/2016   HDL 38.30 (L) 10/01/2016   CHOLHDL 5 10/01/2016   VLDL 20.4 10/01/2016   LDLCALC 133 (H) 10/01/2016   LDLCALC 104 (H) 06/29/2013    Physical Findings: AIMS: Facial and Oral Movements Muscles of Facial Expression: None, normal Lips and Perioral Area: None, normal Jaw: None, normal Tongue: None, normal,Extremity Movements Upper (arms, wrists, hands, fingers): None, normal Lower (legs, knees, ankles, toes): None, normal, Trunk Movements Neck, shoulders, hips: None, normal, Overall Severity Severity of abnormal movements (highest score from questions above): None, normal Incapacitation due to abnormal movements: None, normal Patient's awareness of abnormal movements (rate only patient's report): No Awareness, Dental Status Current problems with teeth and/or dentures?: No Does patient usually wear dentures?: No  CIWA:  CIWA-Ar Total: 0 COWS:     Musculoskeletal: Strength & Muscle Tone: within normal limits Gait & Station: normal Patient leans: N/A  Psychiatric Specialty Exam: Physical Exam  Nursing note and vitals  reviewed. Constitutional: She is oriented to person, place, and time. She appears well-developed and well-nourished.  HENT:  Head: Normocephalic and atraumatic.  Respiratory: Effort normal.  Musculoskeletal: Normal range of motion.  Neurological: She is alert and oriented to person, place, and time.    ROS  Blood pressure (!) 114/42, pulse (!) 116, temperature 98.3 F (36.8 C), temperature source Oral, resp. rate 18, height 5' 5.5" (1.664 m), weight (!) 216.4 kg (477 lb), last menstrual period 04/13/2017, SpO2 97 %.Body mass index is 78.17 kg/m.  General Appearance: Casual  Eye Contact:  Fair  Speech:  Normal Rate  Volume:  Normal  Mood:  Depressed  Affect:  Congruent  Thought Process:  Coherent  Orientation:  Full (Time, Place, and Person)  Thought Content:  Logical  Suicidal Thoughts:  Yes.  without intent/plan  Homicidal Thoughts:  No  Memory:  Immediate;   Good  Judgement:  Intact  Insight:  Fair  Psychomotor Activity:  Normal  Concentration:  Concentration: Fair  Recall:  Fair  Fund of Knowledge:  Good  Language:  Good  Akathisia:  No  Handed:  Right  AIMS (if indicated):     Assets:  Communication Skills Desire for Improvement Financial Resources/Insurance Housing Resilience Talents/Skills  ADL's:  Intact  Cognition:  WNL  Sleep:  Number of Hours: 6.5     Treatment Plan Summary: Daily contact with patient to assess and evaluate symptoms and progress in treatment, Medication management and Plan Patient is seen and examined.  Patient is a 27 year old female with the above-stated past psychiatric history seen in follow-up.  She is doing slightly better today.  We will continue the bupropion, sertraline, prazosin, trazodone.  She is a bit tachycardic today, but her blood pressure looks better.  We will continue the hydrochlorothiazide for now.  We will recheck her chemistries.  She continues to have some transient recurrent suicidal thoughts, and will continue to  monitor them.  She did say the prazosin did decrease the nightmares and flashbacks and that is a good thing.  She denied any side effects to her current medications.  We will continue her current path, and most probably increase her sertraline tomorrow as long as she does not have side effects.  Antonieta Pert, MD 05/06/2017, 1:45 PM

## 2017-05-06 NOTE — Tx Team (Signed)
Interdisciplinary Treatment and Diagnostic Plan Update  05/06/2017 Time of Session: 1:15pm Laura Benson MRN: [id number]  Principal Diagnosis: <principal problem not specified>  Secondary Diagnoses: Active Problems:   MDD (major depressive disorder), severe (HCC)   Current Medications:  Current Facility-Administered Medications  Medication Dose Route Frequency Provider Last Rate Last Dose  . acetaminophen (TYLENOL) tablet 650 mg  650 mg Oral Q6H PRN Money, Lowry Ram, FNP      . alum & mag hydroxide-simeth (MAALOX/MYLANTA) 200-200-20 MG/5ML suspension 30 mL  30 mL Oral Q4H PRN Money, Darnelle Maffucci B, FNP      . buPROPion (WELLBUTRIN XL) 24 hr tablet 150 mg  150 mg Oral Daily Sharma Covert, MD   150 mg at 05/06/17 0745  . hydrochlorothiazide (HYDRODIURIL) tablet 25 mg  25 mg Oral Daily Sharma Covert, MD   25 mg at 05/06/17 0745  . hydrOXYzine (ATARAX/VISTARIL) tablet 25 mg  25 mg Oral TID PRN Money, Lowry Ram, FNP      . LORazepam (ATIVAN) tablet 1 mg  1 mg Oral Q6H PRN Sharma Covert, MD      . magnesium hydroxide (MILK OF MAGNESIA) suspension 30 mL  30 mL Oral Daily PRN Money, Darnelle Maffucci B, FNP      . metFORMIN (GLUCOPHAGE) tablet 500 mg  500 mg Oral BID WC Money, Darnelle Maffucci B, FNP   500 mg at 05/06/17 0745  . prazosin (MINIPRESS) capsule 1 mg  1 mg Oral QHS Sharma Covert, MD   1 mg at 05/05/17 2227  . sertraline (ZOLOFT) tablet 25 mg  25 mg Oral Daily Sharma Covert, MD   25 mg at 05/06/17 0745  . traZODone (DESYREL) tablet 50 mg  50 mg Oral QHS PRN Money, Lowry Ram, FNP   50 mg at 05/05/17 2227   PTA Medications: Medications Prior to Admission  Medication Sig Dispense Refill Last Dose  . albuterol (PROVENTIL HFA;VENTOLIN HFA) 108 (90 Base) MCG/ACT inhaler Inhale 2 puffs every 6 (six) hours as needed into the lungs for wheezing or shortness of breath. (Patient not taking: Reported on 05/04/2017) 1 Inhaler 2 Not Taking at Unknown time  . blood glucose meter kit and supplies KIT  Dispense based on patient and insurance preference. Use up to four times daily as directed. (FOR ICD-9 250.00, 250.01). 1 each 0 Taking  . Blood Pressure Monitoring (BLOOD PRESSURE MONITOR/L CUFF) MISC Check your blood pressure daily. 1 each 0 Taking  . buPROPion (WELLBUTRIN XL) 150 MG 24 hr tablet Take 1 tablet (150 mg total) by mouth daily. 30 tablet 2 05/04/2017 at Unknown time  . chlordiazePOXIDE (LIBRIUM) 25 MG capsule Take 1 capsule (25 mg total) by mouth 3 (three) times daily as needed for withdrawal. 30 capsule 0 05/04/2017 at Unknown time  . dicyclomine (BENTYL) 20 MG tablet Take 1 tablet (20 mg total) by mouth 2 (two) times daily. (Patient not taking: Reported on 05/04/2017) 20 tablet 0 Not Taking at Unknown time  . glucose blood (TRUE METRIX BLOOD GLUCOSE TEST) test strip Use as instructed 100 each 12 Taking  . hydrochlorothiazide (HYDRODIURIL) 25 MG tablet Take 1 tablet (25 mg total) by mouth daily. 90 tablet 2 05/04/2017 at Unknown time  . ibuprofen (ADVIL,MOTRIN) 200 MG tablet Take 400 mg by mouth daily as needed for headache (migraine).    Past Week at Unknown time  . lisinopril (PRINIVIL,ZESTRIL) 10 MG tablet Take 1 tablet (10 mg total) by mouth daily. (Patient not taking: Reported on 05/04/2017) 90 tablet  2 Not Taking at Unknown time  . metFORMIN (GLUCOPHAGE) 500 MG tablet Take 1 tablet (500 mg total) by mouth 2 (two) times daily with a meal. 60 tablet 0 Past Month at Unknown time  . [place] Patient is to test up to four times daily. Dx E11.9 100 each 11 Taking    Patient Stressors: Health problems Marital or family conflict Medication change or noncompliance  Patient Strengths: Average or above average intelligence Capable of independent living General fund of knowledge  Treatment Modalities: Medication Management, Group therapy, Case management,  1 to 1 session with clinician, Psychoeducation, Recreational therapy.   Physician Treatment Plan for Primary  Diagnosis: <principal problem not specified> Long Term Goal(s): Improvement in symptoms so as ready for discharge Improvement in symptoms so as ready for discharge   Short Term Goals: Ability to identify changes in lifestyle to reduce recurrence of condition will improve Ability to verbalize feelings will improve Ability to disclose and discuss suicidal ideas Ability to demonstrate self-control will improve Ability to identify and develop effective coping behaviors will improve Ability to maintain clinical measurements within normal limits will improve Compliance with prescribed medications will improve Ability to identify triggers associated with substance abuse/mental health issues will improve Ability to identify changes in lifestyle to reduce recurrence of condition will improve Ability to verbalize feelings will improve Ability to disclose and discuss suicidal ideas Ability to demonstrate self-control will improve Ability to identify and develop effective coping behaviors will improve Ability to maintain clinical measurements within normal limits will improve Compliance with prescribed medications will improve Ability to identify triggers associated with substance abuse/mental health issues will improve  Medication Management: Evaluate patient's response, side effects, and tolerance of medication regimen.  Therapeutic Interventions: 1 to 1 sessions, Unit Group sessions and Medication administration.  Evaluation of Outcomes: Progressing  Physician Treatment Plan for Secondary Diagnosis: Active Problems:   MDD (major depressive disorder), severe ([place])  Long Term Goal(s): Improvement in symptoms so as ready for discharge Improvement in symptoms so as ready for discharge   Short Term Goals: Ability to identify changes in lifestyle to reduce recurrence of condition will improve Ability to verbalize feelings will improve Ability to disclose and discuss suicidal ideas Ability to  demonstrate self-control will improve Ability to identify and develop effective coping behaviors will improve Ability to maintain clinical measurements within normal limits will improve Compliance with prescribed medications will improve Ability to identify triggers associated with substance abuse/mental health issues will improve Ability to identify changes in lifestyle to reduce recurrence of condition will improve Ability to verbalize feelings will improve Ability to disclose and discuss suicidal ideas Ability to demonstrate self-control will improve Ability to identify and develop effective coping behaviors will improve Ability to maintain clinical measurements within normal limits will improve Compliance with prescribed medications will improve Ability to identify triggers associated with substance abuse/mental health issues will improve     Medication Management: Evaluate patient's response, side effects, and tolerance of medication regimen.  Therapeutic Interventions: 1 to 1 sessions, Unit Group sessions and Medication administration.  Evaluation of Outcomes: Progressing   RN Treatment Plan for Primary Diagnosis: <principal problem not specified> Long Term Goal(s): Knowledge of disease and therapeutic regimen to maintain health will improve  Short Term Goals: Ability to remain free from injury will improve, Ability to verbalize frustration and anger appropriately will improve, Ability to disclose and discuss suicidal ideas and Compliance with prescribed medications will improve  Medication Management: RN will administer  medications as ordered by provider, will assess and evaluate patient's response and provide education to patient for prescribed medication. RN will report any adverse and/or side effects to prescribing provider.  Therapeutic Interventions: 1 on 1 counseling sessions, Psychoeducation, Medication administration, Evaluate responses to treatment, Monitor vital signs and  CBGs as ordered, Perform/monitor CIWA, COWS, AIMS and Fall Risk screenings as ordered, Perform wound care treatments as ordered.  Evaluation of Outcomes: Progressing   LCSW Treatment Plan for Primary Diagnosis: <principal problem not specified> Long Term Goal(s): Safe transition to appropriate next level of care at discharge, Engage patient in therapeutic group addressing interpersonal concerns.  Short Term Goals: Engage patient in aftercare planning with referrals and resources, Increase social support, Increase emotional regulation and Increase skills for wellness and recovery  Therapeutic Interventions: Assess for all discharge needs, 1 to 1 time with Social worker, Explore available resources and support systems, Assess for adequacy in community support network, Educate family and significant other(s) on suicide prevention, Complete Psychosocial Assessment, Interpersonal group therapy.  Evaluation of Outcomes: Progressing   Progress in Treatment: Attending groups: No. Participating in groups: No. Taking medication as prescribed: Yes. Toleration medication: Yes. Family/Significant other contact made: No, will contact:  will ask patient to identify contact Patient understands diagnosis: Yes. Discussing patient identified problems/goals with staff: Yes. Medical problems stabilized or resolved: Yes. Denies suicidal/homicidal ideation: No. Endorses passive SI Issues/concerns per patient self-inventory: Yes.  New problem(s) identified: No, Describe:  CSW continuing to assess  New Short Term/Long Term Goal(s): Patient's goal "try to open up."  Discharge Plan or Barriers: Patient will discharge home.  Reason for Continuation of Hospitalization: Anxiety Depression Suicidal ideation  Estimated Length of Stay: Continuing to assess  Attendees: Patient: Shalece Staffa 05/06/2017 2:49 PM  Physician: Queen Blossom 05/06/2017 2:49 PM  Nursing: Jinny Sanders, RN 05/06/2017 2:49 PM  RN Care Manager:  05/06/2017 2:49 PM  Social Worker: Stephanie Acre, Social Work Intern 05/06/2017 2:49 PM  Recreational Therapist:  05/06/2017 2:49 PM  Other:  05/06/2017 2:49 PM  Other:  05/06/2017 2:49 PM  Other: 05/06/2017 2:49 PM    Scribe for Treatment Team: Joellen Jersey, [place] Work 05/06/2017 2:49 PM

## 2017-05-06 NOTE — [facility] Group Notes (Signed)
[facility] [employer] Group Therapy  05/06/2017  ? Type of Therapy: Mental [employer] Presentation ? Participation Level: Active ? Participation Quality: Attentive ? Affect: Appropriate ? Cognitive: Oriented ? Insight: Developing/Improving ? Engagement in Therapy: Engaged ? Modes of Intervention: Discussion, Education and Socialization ? Summary of Progress/Problems: Mental [employer] Association (MHA) Speaker came to talk about his personal journey with mental health. The pt processed ways by which to relate to the speaker. MHA speaker provided handouts and educational information pertaining to groups and services offered by the MHA. Pt was engaged in speaker's presentation and was receptive to resources provided.              

## 2017-05-06 NOTE — [facility] Group Notes (Signed)
[facility] Group Notes:  (Nursing/MHT/Case Management/Adjunct)  Date:  05/06/2017  Time:  1330  Type of Therapy:  Nurse Education  Participation Level:  Active  Participation Quality:  Appropriate, Attentive and Sharing  Affect:  Appropriate and Flat  Cognitive:  Alert and Appropriate  Insight:  Appropriate and Good  Engagement in Group:  Supportive  Modes of Intervention:  Discussion, Exploration and Support  Summary of Progress/Problems: pt participated and contributed to the discussion of positive self talk.  Suszanne ConnersMichael R Dejanay Wamboldt 05/06/2017, 4:04 PM

## 2017-05-06 NOTE — Progress Notes (Signed)
D: Patient observed up at NS and returned CPAP to staff. Patient states, "I am doing okay however affect flat, depressed and sad with congruent mood. Per self inventory and discussions with writer, rates depression at an 8/10, hopelessness at a 9/10 and anxiety at a 4/10. Rates sleep as fair, appetite as fair, energy as low and concentration as poor.  States goal for today is "I need to learn how to open up and speak uot more. Try to talk about my problem." Denies pain, physical complaints.   A: Medicated per orders, no prns requested or required. Level III obs in place for safety. Emotional support offered and self inventory reviewed. Encouraged completion of Suicide Safety Plan and programming participation. Discussed POC with MD, SW. (Patient is wearing yellow socks due to sizing limitations in blue/brown.)  R: Patient verbalizes understanding of POC. Patient denies SI/HI/AVH and remains safe on level III obs. Will continue to monitor closely and make verbal contact frequently.

## 2017-05-06 NOTE — Progress Notes (Signed)
D    Pt appears depressed and sad    She is not very forthcoming with information   She spent a lot of time on the phone this evening   Her interactions with others are minimal and she does not initiate conversation with others   She is more interactive and is appropriate A   Verbal support given   Medications administered and effectiveness monitored   Q 15 min checks   R   Pt is safe at present time
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
## 2017-05-06 NOTE — Progress Notes (Signed)
Recreation Therapy Notes  Date: 4.10.19 Time: 9:30 a.m. Location: [address] Dayroom   Group Topic: Stress Management   Goal Area(s) Addresses:  Goal 1.1: To reduce stress  -Patient will feel a reduction in stress level  -Patient will learn the importance of stress management  -Patient will participate during stress management group      Intervention: Stress Management  Activity: Meditation- Patients were in a peaceful environment with soft lighting enhancing patients mood. Patients listened to a deep concentration meditation to help decrease stress levels   Education: Stress Management, Discharge Planning.    Education Outcome: Acknowledges edcuation/In group clarification offered/Needs additional education   Clinical Observations/Feedback:: Patient did not attend     Sheryle HailDarian Javante Nilsson, Recreation Therapy Intern   Sheryle HailDarian Melquan Ernsberger 05/06/2017 8:40 AM

## 2017-05-06 NOTE — Progress Notes (Signed)
Adult Psychoeducational Group Note  Date:  05/06/2017 Time:  8:42 PM  Group Topic/Focus:  Wrap-Up Group:   The focus of this group is to help patients review their daily goal of treatment and discuss progress on daily workbooks.  Participation Level:  Active  Participation Quality:  Appropriate  Affect:  Appropriate  Cognitive:  Alert and Oriented  Insight: Good  Engagement in Group:  Engaged  Modes of Intervention:  Discussion  Additional Comments:  Pt attended group this evening. Pt rated her day a 9/10.  Leo GrosserMegan A Brandley Aldrete 05/06/2017, 8:42 PM

## 2017-05-07 DIAGNOSIS — Z87891 Personal history of nicotine dependence: Secondary | ICD-10-CM

## 2017-05-07 DIAGNOSIS — R450 Nervousness: Secondary | ICD-10-CM

## 2017-05-07 DIAGNOSIS — F332 Major depressive disorder, recurrent severe without psychotic features: Principal | ICD-10-CM

## 2017-05-07 DIAGNOSIS — F1010 Alcohol abuse, uncomplicated: Secondary | ICD-10-CM

## 2017-05-07 DIAGNOSIS — G4700 Insomnia, unspecified: Secondary | ICD-10-CM

## 2017-05-07 DIAGNOSIS — F4310 Post-traumatic stress disorder, unspecified: Secondary | ICD-10-CM

## 2017-05-07 LAB — GLUCOSE, CAPILLARY: Glucose-Capillary: 169 mg/dL — ABNORMAL HIGH (ref 65–99)

## 2017-05-07 LAB — BASIC METABOLIC PANEL
Anion gap: 10 (ref 5–15)
BUN: 11 mg/dL (ref 6–20)
CALCIUM: 9.2 mg/dL (ref 8.9–10.3)
CHLORIDE: 102 mmol/L (ref 101–111)
CO2: 24 mmol/L (ref 22–32)
CREATININE: 0.73 mg/dL (ref 0.44–1.00)
GFR calc non Af Amer: >60 mL/min (ref >60)
GLUCOSE: 188 mg/dL — AB (ref 65–99)
Potassium: 3.8 mmol/L (ref 3.5–5.1)
Sodium: 136 mmol/L (ref 135–145)

## 2017-05-07 MED ORDER — METFORMIN HCL 500 MG PO TABS
1000.0000 mg | ORAL_TABLET | Freq: Two times a day (BID) | ORAL | Status: DC
  Administered 2017-05-07 – 2017-05-10 (×6): 1000 mg via ORAL
  Filled 2017-05-07 (×11): qty 2

## 2017-05-07 MED ORDER — TRAZODONE HCL 100 MG PO TABS
100.0000 mg | ORAL_TABLET | Freq: Every evening | ORAL | Status: DC | PRN
  Administered 2017-05-07 – 2017-05-09 (×3): 100 mg via ORAL
  Filled 2017-05-07 (×3): qty 1

## 2017-05-07 MED ORDER — PHENAZOPYRIDINE HCL 100 MG PO TABS
100.0000 mg | ORAL_TABLET | Freq: Three times a day (TID) | ORAL | Status: DC
  Administered 2017-05-07 – 2017-05-10 (×9): 100 mg via ORAL
  Filled 2017-05-07 (×14): qty 1

## 2017-05-07 MED ORDER — PRAZOSIN HCL 2 MG PO CAPS
2.0000 mg | ORAL_CAPSULE | Freq: Every day | ORAL | Status: DC
  Administered 2017-05-07: 2 mg via ORAL
  Filled 2017-05-07 (×2): qty 1

## 2017-05-07 MED ORDER — SERTRALINE HCL 50 MG PO TABS
50.0000 mg | ORAL_TABLET | Freq: Every day | ORAL | Status: DC
  Administered 2017-05-08 – 2017-05-10 (×3): 50 mg via ORAL
  Filled 2017-05-07 (×5): qty 1

## 2017-05-07 MED ORDER — LISINOPRIL 5 MG PO TABS
5.0000 mg | ORAL_TABLET | Freq: Every day | ORAL | Status: DC
  Administered 2017-05-07 – 2017-05-10 (×4): 5 mg via ORAL
  Filled 2017-05-07 (×8): qty 1

## 2017-05-07 MED ORDER — LOPERAMIDE HCL 2 MG PO CAPS: 2.0000 mg | ORAL_CAPSULE | ORAL | Status: DC | PRN

## 2017-05-07 NOTE — BHH Suicide Risk Assessment (Signed)
B[facility] INPATIENT:  Family/Significant Other Suicide Prevention Education  Suicide Prevention Education:  Education Completed; patient's father, Laura Benson [phone]) has been identified by the patient as the family member/significant other with whom the patient will be residing, and identified as the person(s) who will aid the patient in the event of a mental health crisis (suicidal ideations/suicide attempt).  With written consent from the patient, the family member/significant other has been provided the following suicide prevention education, prior to the and/or following the discharge of the patient.  The suicide prevention education provided includes the following:  Suicide risk factors  Suicide prevention and interventions  National Suicide Hotline telephone number  [facility] assessment telephone number  [facility] Emergency Assistance 911  [facility] and/or Residential Mobile Crisis Unit telephone number  Request made of family/significant other to:  Remove weapons (e.g., guns, rifles, knives), all items previously/currently identified as safety concern.    Remove drugs/medications (over-the-counter, prescriptions, illicit drugs), all items previously/currently identified as a safety concern.  The family member/significant other verbalizes understanding of the suicide prevention education information provided.  The family member/significant other agrees to remove the items of safety concern listed above.  Patient's father, Laura Benson, states there are no guns or weapons in the patient's apartment.  Laura Benson states he has not noticed any difference in the patient's mood or behavior. Laura Benson says she "just seems like her usual self" and repeated "I don't know what's wrong with her."  Laura Benson had no questions for the writer or significant collateral information. Laura Benson was encouraged to callback should he have more information to provide at a later date or any  questions.  Laura Benson 05/07/2017, 10:22 AM

## 2017-05-07 NOTE — Progress Notes (Signed)
DAR NOTE: Patient presents with flat affect and depressed mood.  Denies suicidal thoughts, auditory and visual hallucinations.  Described energy level as low and concentration as poor.  Rates depression at 8, hopelessness at 9, and anxiety at 6.  Maintained on routine safety checks.  Medications given as prescribed.  Support and encouragement offered as needed.  Attended group and participated.  States goal for today is "getting myself together."  Patient observed socializing with peers in the dayroom.  Reports urinary discomfort and burning.  Antibiotic therapy initiated.  No adverse effect noted.  patient is safe on the unit.

## 2017-05-07 NOTE — Progress Notes (Signed)
[facility] MD Progress Note  05/07/2017 2:32 PM Laura Benson  MRN:  [id number]   Subjective: Patient reports that she is feeling slightly more depressed today.  She states that it is due to her having bad dreams again last night.  She stated that the prazosin worked the first night, but last night the dreams returned.  Patient also has questions about her Wellbutrin and Zoloft, as far as that she is going to continue both.  Patient reports that she has had issues with sleep at home as well and would like to ensure that she has medication to help with sleep before she leaves.  Even with the increased depression and anxiety today, she still denies any SI/HI/AVH and contracts for safety.  Objective: Patient's chart and findings reviewed and discussed with treatment team.  Patient is pleasant and cooperative.  She presents in the hallway receiving her medications.  Patient was late coming to the window for medications and she reported it was due to her increased depression today as she did not want to get out of the bed.  She presents with a flat affect and appears depressed.  After discussing medications with Dr. Mallie Darting and the patient we will stop the Wellbutrin and increase the Zoloft to 50 mg a day.  We will also increase the prazosin to 2 mg at bedtime, as well as increase the trazodone to 100 mg at bedtime as needed.  We discussed starting the patient on Seroquel, however, due to her diabetes as well as obesity will not start Seroquel.  Also discussed using Remeron but decided against it due to obesity.  Would prefer patient to stay on trazodone for sleep.  Principal Problem: MDD (major depressive disorder), severe ([place]) Diagnosis:   Patient Active Problem List   Diagnosis Date Noted  . MDD (major depressive disorder), severe (Home) [F32.2] 05/05/2017  . Alcohol abuse [F10.10] 04/30/2017  . Umbilical hernia without obstruction and without gangrene [K42.9] 02/25/2017  . Obstructive sleep apnea [G47.33]  12/11/2016  . Diabetes mellitus without complication ([place]) [E26.8] 01/23/2016  . Hypertension [I10] 01/23/2016  . Dysmenorrhea [N94.6] 12/25/2013  . Rectal bleeding [K62.5] 10/20/2012  . Depression, recurrent ([place]) [F33.9] 01/05/2009  . BREAST PAIN, BILATERAL [N64.4] 01/05/2009  . INTERTRIGO, CANDIDAL [L53.8] 01/05/2009  . ABSENCE OF MENSTRUATION [N91.2] 09/20/2008  . DELAYED MENSES [N94.9] 08/25/2008  . ASCUS PAP [R87.610] 05/05/2008  . OBESITY, CLASS III [E66.01] 03/26/2006  . HYPERTENSION, BENIGN SYSTEMIC [I10] 03/26/2006   Total Time spent with patient: 30 minutes  Past Psychiatric History: See H&P  Past Medical History:  Past Medical History:  Diagnosis Date  . Asthma   . Depression   . Diabetes mellitus without complication ([place])   . Hypertension   . Morbid obesity ([place])   . Sleep apnea   . Thyroid disease     Past Surgical History:  Procedure Laterality Date  . ADENOIDECTOMY    . TONSILLECTOMY     Family History:  Family History  Problem Relation Age of Onset  . Hypertension Mother   . Diabetes Mother   . Colon cancer Father   . Hypertension Maternal Grandmother   . Hypertension Paternal Grandmother   . Diabetes Paternal Grandmother   . Breast cancer Cousin    Family Psychiatric  History: See H&P Social History:  Social History   Substance and Sexual Activity  Alcohol Use No     Social History   Substance and Sexual Activity  Drug Use No    Social History  Socioeconomic History  . Marital status: Single    Spouse name: Not on file  . Number of children: 0  . Years of education: Not on file  . Highest education level: Not on file  Occupational History    Employer: [employer]  Social Needs  . Financial resource strain: Not on file  . Food insecurity:    Worry: Not on file    Inability: Not on file  . Transportation needs:    Medical: Not on file    Non-medical: Not on file  Tobacco Use  . Smoking status: Former Smoker    Types: Cigarettes   . Smokeless tobacco: Never Used  Substance and Sexual Activity  . Alcohol use: No  . Drug use: No  . Sexual activity: Yes    Birth control/protection: None    Comment: No form of birth control used.  Lifestyle  . Physical activity:    Days per week: Not on file    Minutes per session: Not on file  . Stress: Not on file  Relationships  . Social connections:    Talks on phone: Not on file    Gets together: Not on file    Attends religious service: Not on file    Active member of club or organization: Not on file    Attends meetings of clubs or organizations: Not on file    Relationship status: Not on file  Other Topics Concern  . Not on file  Social History Narrative  . Not on file   Additional Social History:                         Sleep: Fair  Appetite:  Good  Current Medications: Current Facility-Administered Medications  Medication Dose Route Frequency Provider Last Rate Last Dose  . acetaminophen (TYLENOL) tablet 650 mg  650 mg Oral Q6H PRN , Lowry Ram, FNP      . alum & mag hydroxide-simeth (MAALOX/MYLANTA) 200-200-20 MG/5ML suspension 30 mL  30 mL Oral Q4H PRN , Darnelle Maffucci B, FNP      . hydrochlorothiazide (HYDRODIURIL) tablet 25 mg  25 mg Oral Daily Sharma Covert, MD   25 mg at 05/07/17 0953  . hydrOXYzine (ATARAX/VISTARIL) tablet 25 mg  25 mg Oral TID PRN , Lowry Ram, FNP      . LORazepam (ATIVAN) tablet 1 mg  1 mg Oral Q6H PRN Sharma Covert, MD      . magnesium hydroxide (MILK OF MAGNESIA) suspension 30 mL  30 mL Oral Daily PRN , Darnelle Maffucci B, FNP      . metFORMIN (GLUCOPHAGE) tablet 500 mg  500 mg Oral BID WC , Lowry Ram, FNP   500 mg at 05/07/17 0954  . prazosin (MINIPRESS) capsule 2 mg  2 mg Oral QHS , Lowry Ram, FNP      . [START ON 05/08/2017] sertraline (ZOLOFT) tablet 50 mg  50 mg Oral Daily , Lowry Ram, FNP      . traZODone (DESYREL) tablet 100 mg  100 mg Oral QHS PRN , Lowry Ram, FNP        Lab Results:   Results for orders placed or performed during the hospital encounter of 05/05/17 (from the past 48 hour(s))  Glucose, capillary     Status: Abnormal   Collection Time: 05/06/17  5:48 AM  Result Value Ref Range   Glucose-Capillary 162 (H) 65 - 99 mg/dL   Comment 1 Notify RN   Hemoglobin  A1c     Status: Abnormal   Collection Time: 05/06/17  6:34 AM  Result Value Ref Range   Hgb A1c MFr Bld 9.2 (H) 4.8 - 5.6 %    Comment: (NOTE) Pre diabetes:          5.7%-6.4% Diabetes:              >6.4% Glycemic control for   <7.0% adults with diabetes    Mean Plasma Glucose 217.34 mg/dL    Comment: Performed at [facility] [address] [place], [place] [address]  Glucose, capillary     Status: Abnormal   Collection Time: 05/07/17  6:08 AM  Result Value Ref Range   Glucose-Capillary 169 (H) 65 - 99 mg/dL   Comment 1 Notify RN    Comment 2 Document in Chart   Basic metabolic panel     Status: Abnormal   Collection Time: 05/07/17  6:31 AM  Result Value Ref Range   Sodium 136 135 - 145 mmol/L   Potassium 3.8 3.5 - 5.1 mmol/L   Chloride 102 101 - 111 mmol/L   CO2 24 22 - 32 mmol/L   Glucose, Bld 188 (H) 65 - 99 mg/dL   BUN 11 6 - 20 mg/dL   Creatinine, Ser 0.73 0.44 - 1.00 mg/dL   Calcium 9.2 8.9 - 10.3 mg/dL   GFR calc non Af Amer >60 >60 mL/min   GFR calc Af Amer >60 >60 mL/min    Comment: (NOTE) The eGFR has been calculated using the CKD EPI equation. This calculation has not been validated in all clinical situations. eGFR's persistently <60 mL/min signify possible Chronic Kidney Disease.    Anion gap 10 5 - 15    Comment: Performed at [facility], [place] [address], [place], [place] [address]    Blood Alcohol level:  Lab Results  Component Value Date   ETH <10 53/97/6734    Metabolic Disorder Labs: Lab Results  Component Value Date   HGBA1C 9.2 (H) 05/06/2017   MPG 217.34 05/06/2017   MPG 217 (H) 06/28/2013   No results found for:  PROLACTIN Lab Results  Component Value Date   CHOL 192 10/01/2016   TRIG 102.0 10/01/2016   HDL 38.30 (L) 10/01/2016   CHOLHDL 5 10/01/2016   VLDL 20.4 10/01/2016   LDLCALC 133 (H) 10/01/2016   LDLCALC 104 (H) 06/29/2013    Physical Findings: AIMS: Facial and Oral Movements Muscles of Facial Expression: None, normal Lips and Perioral Area: None, normal Jaw: None, normal Tongue: None, normal,Extremity Movements Upper (arms, wrists, hands, fingers): None, normal Lower (legs, knees, ankles, toes): None, normal, Trunk Movements Neck, shoulders, hips: None, normal, Overall Severity Severity of abnormal movements (highest score from questions above): None, normal Incapacitation due to abnormal movements: None, normal Patient's awareness of abnormal movements (rate only patient's report): No Awareness, Dental Status Current problems with teeth and/or dentures?: No Does patient usually wear dentures?: No  CIWA:  CIWA-Ar Total: 0 COWS:     Musculoskeletal: Strength & Muscle Tone: within normal limits Gait & Station: normal Patient leans: N/A  Psychiatric Specialty Exam: Physical Exam  Nursing note and vitals reviewed. Constitutional: She is oriented to person, place, and time. She appears well-developed and well-nourished.  Respiratory: Effort normal.  Musculoskeletal: Normal range of motion.  Neurological: She is alert and oriented to person, place, and time.  Skin: Skin is warm.    Review of Systems  Constitutional: Negative.   HENT: Negative.  Eyes: Negative.   Respiratory: Negative.   Cardiovascular: Negative.   Gastrointestinal: Negative.   Genitourinary: Negative.   Musculoskeletal: Negative.   Skin: Negative.   Neurological: Negative.   Endo/Heme/Allergies: Negative.   Psychiatric/Behavioral: Positive for depression and substance abuse. Negative for hallucinations and suicidal ideas. The patient is nervous/anxious and has insomnia.     Blood pressure 137/90,  pulse (!) 123, temperature 97.9 F (36.6 C), temperature source Oral, resp. rate 18, height 5' 5.5" (1.664 m), weight (!) 216.4 kg (477 lb), last menstrual period 04/13/2017, SpO2 97 %.Body mass index is 78.17 kg/m.  General Appearance: Casual  Eye Contact:  Good  Speech:  Clear and Coherent and Normal Rate  Volume:  Normal  Mood:  Depressed  Affect:  Flat  Thought Process:  Goal Directed and Descriptions of Associations: Intact  Orientation:  Full (Time, Place, and Person)  Thought Content:  WDL  Suicidal Thoughts:  No  Homicidal Thoughts:  No  Memory:  Immediate;   Good Recent;   Good Remote;   Good  Judgement:  Fair  Insight:  Fair  Psychomotor Activity:  Normal  Concentration:  Concentration: Good and Attention Span: Good  Recall:  Good  Fund of Knowledge:  Good  Language:  Good  Akathisia:  No  Handed:  Right  AIMS (if indicated):     Assets:  Communication Skills Desire for Improvement Financial Resources/Insurance Housing Social Support Transportation  ADL's:  Intact  Cognition:  WNL  Sleep:  Number of Hours: 5.5   Problems addressed MDD severe  Treatment Plan Summary: Daily contact with patient to assess and evaluate symptoms and progress in treatment, Medication management and Plan is to:  -Stop Wellbutrin -Increase Zoloft 50 mg p.o. daily for mood stability -Increase prazosin 2 mg nightly for PTSD nightmares -Increase trazodone 100 mg nightly as needed for insomnia -Continue Vistaril 25 mg p.o. 3 times daily as needed for anxiety -Continue Ativan 1 mg every 6 hours as needed for CIWA >8, but would recommend discontinuing tomorrow if patient does not meet requirements for Ativan. -Encourage group therapy participation  Lewis Shock, [place] 05/07/2017, 2:32 PM

## 2017-05-07 NOTE — BHH Group Notes (Signed)
LCSW Group Therapy Note  05/07/2017 1:15pm  Type of Therapy/Topic:  Group Therapy:  Balance in Life  Participation Level:  Active  Description of Group:    This group will address the concept of balance and how it feels and looks when one is unbalanced. Patients will be encouraged to process areas in their lives that are out of balance and identify reasons for remaining unbalanced. Facilitators will guide patients in utilizing problem-solving interventions to address and correct the stressor making their life unbalanced. Understanding and applying boundaries will be explored and addressed for obtaining and maintaining a balanced life. Patients will be encouraged to explore ways to assertively make their unbalanced needs known to significant others in their lives, using other group members and facilitator for support and feedback.  Therapeutic Goals: 1. Patient will identify two or more emotions or situations they have that consume much of in their lives. 2. Patient will identify signs/triggers that life has become out of balance:  3. Patient will identify two ways to set boundaries in order to achieve balance in their lives:  4. Patient will demonstrate ability to communicate their needs through discussion and/or role plays  Summary of Patient Progress: Patient appropriately participated in group. Patient identified physical health, family relationships, and mental health as aspects of her life that feel most imbalanced at this time.   Therapeutic Modalities:   Cognitive Behavioral Therapy Solution-Focused Therapy Assertiveness Training  Darreld McleanCharlotte C Bell Cai, Student-Social Work 05/07/2017 12:59 PM

## 2017-05-07 NOTE — Progress Notes (Signed)
Adult Psychoeducational Group Note  Date:  05/07/2017 Time:  4:47 PM  Group Topic/Focus:  Goals Group:   The focus of this group is to help patients establish daily goals to achieve during treatment and discuss how the patient can incorporate goal setting into their daily lives to aide in recovery.  Participation Level:  Active  Participation Quality:  Appropriate  Affect:  Appropriate  Cognitive:  Alert  Insight: Appropriate  Engagement in Group:  Engaged  Modes of Intervention:  Activity  Additional Comments:  Pt attended group and participated in group activity.  Christyann Manolis R Gilmore List 05/07/2017, 4:47 PM

## 2017-05-07 NOTE — Progress Notes (Signed)
  DATA ACTION RESPONSE  Objective- Pt. is visible in the dayroom, seen watching TV.Presents with an anxious/depressed affect and mood. Pt appears to be somatically focused. Pt c/o of diarrhea this evening. BP/HR was elevated at bedtime.CPAP by bedside; will collect in the a.m. Trazodone was increase at bedtime.   Subjective- Denies having any SI/HI/AVH/Pain at this time. Is cooperative and remains safe on the unit.  1:1 interaction in private to establish rapport. Encouragement, education, & support given from staff.  PRN vistaril requested and will re-eval accordingly.   Safety maintained with Q 15 checks. Continue with POC.

## 2017-05-07 NOTE — [facility] Counselor (Signed)
Patient reports that her mother and sister visited last night and that they had a "good visit." Patient declined to elaborate but endorsed that they were able to talk without arguing or the patient becoming emotional.

## 2017-05-07 NOTE — [facility] Group Notes (Signed)
Adult Psychoeducational Group Note  Date:  05/07/2017 Time:  10:14 PM  Group Topic/Focus:  Wrap-Up Group:   The focus of this group is to help patients review their daily goal of treatment and discuss progress on daily workbooks.  Participation Level:  Active  Participation Quality:  Appropriate and Attentive  Affect:  Appropriate  Cognitive:  Alert and Appropriate  Insight: Appropriate and Good  Engagement in Group:  Engaged  Modes of Intervention:  Discussion and Education  Additional Comments:  Pt attended and participated in wrap up group this evening. Pt has an okay day and their goal was to be more open. Pt has been talking to their hall mates, but they want to do more.   Chrisandra NettersOctavia A Alvino Lechuga 05/07/2017, 10:14 PM

## 2017-05-08 ENCOUNTER — Telehealth: Payer: Self-pay | Admitting: *Deleted

## 2017-05-08 DIAGNOSIS — R000 Tachycardia, unspecified: Secondary | ICD-10-CM

## 2017-05-08 LAB — GLUCOSE, CAPILLARY: Glucose-Capillary: 146 mg/dL — ABNORMAL HIGH (ref 65–99)

## 2017-05-08 MED ORDER — PRAZOSIN HCL 1 MG PO CAPS
1.0000 mg | ORAL_CAPSULE | Freq: Every day | ORAL | Status: DC
  Administered 2017-05-08 – 2017-05-09 (×2): 1 mg via ORAL
  Filled 2017-05-08 (×4): qty 1

## 2017-05-08 MED ORDER — METOPROLOL SUCCINATE 12.5 MG HALF TABLET: 12.5000 mg | ORAL_TABLET | Freq: Every day | ORAL | Status: DC

## 2017-05-08 MED ORDER — METOPROLOL SUCCINATE 12.5 MG HALF TABLET
12.5000 mg | ORAL_TABLET | Freq: Every day | ORAL | Status: DC
  Administered 2017-05-08 – 2017-05-10 (×3): 12.5 mg via ORAL
  Filled 2017-05-08 (×5): qty 1

## 2017-05-08 NOTE — Telephone Encounter (Signed)
Began Disability paperwork, [facility]; will complete when discharged and more information can be obtained/SLS

## 2017-05-08 NOTE — Progress Notes (Signed)
Recreation Therapy Notes  Date: 4.12.19 Time: 9:30 a.m. Location: [address] Dayroom   Group Topic: Stress Management   Goal Area(s) Addresses:  Goal 1.1: To reduce stress  -Patient will feel a reduction in stress level  -Patient will understand the importance of stress management  -Patient will participate during stress management group      Intervention: Stress Management  Activity: Meditation- Patients were in a peaceful environment with soft lighting enhancing patients mood. Patients listened to a body scan meditation on the calm app to help decrease tension and stress levels   Education: Stress Management, Discharge Planning.    Education Outcome: Acknowledges edcuation/In group clarification offered/Needs additional education   Clinical Observations/Feedback:: Patient did not attend     Sheryle HailDarian Sira Adsit, Recreation Therapy Intern   Sheryle HailDarian Nakaiya Beddow 05/08/2017 8:44 AM

## 2017-05-08 NOTE — Progress Notes (Signed)
Adult Psychoeducational Group Note  Date:  05/08/2017 Time:  5:27 PM  Group Topic/Focus:  Primary and Secondary Emotions:   The focus of this group is to discuss the difference between primary and secondary emotions.  Participation Level:  Active  Participation Quality:  Appropriate  Affect:  Appropriate  Cognitive:  Alert  Insight: Good  Engagement in Group:  Engaged  Modes of Intervention:  Discussion  Additional Comments:  Pt shared her story and was open to feedback from her peers.  Pt thanked the group for the words of encouragement.  Wyn ForsterLeonard B Tache Bobst 05/08/2017, 5:27 PM

## 2017-05-08 NOTE — Progress Notes (Signed)
DATA ACTION RESPONSE  Objective- Pt. is visible in the dayroom, seen watching TV.Presents with an anxious/depressed/flat affect and mood. Pt stays to self when in dayroom. Pt states she is not ready for d/c. Pt c/o of insomnia and anxiety this evening. CPAP by bedside.  Subjective- Denies having any SI/HI/AVH/Pain at this time. Is cooperative and remains safe on the unit.  1:1 interaction in private to establish rapport. Encouragement, education, & support given from staff.  PRN vistaril and trazodone requested and will re-eval accordingly.   Safety maintained with Q 15 checks. Continue with POC.

## 2017-05-08 NOTE — Progress Notes (Signed)
D) Pt has been in the dayroom on and off. States that she is just so very, very tired. Having trouble staying awake. An EKG was obtained from Pt this afternoon and reported to her doctor. Affect is flat and mood depressed. Pt denies SI and HI. Rates her depression at an 8, hopelessness at an 8 and his anxiety at an 8. States that she wants to be more open about her feelings and be able to share them with others. A) Given support, reassurance and praise. Provided with a 1:1. Encouragement given to speak with the staff so she can learn more about herself. R) Denies SI and HI.

## 2017-05-08 NOTE — Progress Notes (Signed)
Pt attend wrap up group. Her day was a 7. Her goal was to stay out of bed all day. She accomplished. She need to open up more and she accomplished this goal.

## 2017-05-08 NOTE — [facility] Group Notes (Signed)
[facility] LCSW Group Therapy Note  Date/Time: 05/08/17, 1315  Type of Therapy and Topic:  Group Therapy:  Feelings around Relapse and Recovery  Participation Level:  None   Mood:  Description of Group:    Patients in this group will discuss emotions they experience before and after a relapse. They will process how experiencing these feelings, or avoidance of experiencing them, relates to having a relapse. Facilitator will guide patients to explore emotions they have related to recovery. Patients will be encouraged to process which emotions are more powerful. They will be guided to discuss the emotional reaction significant others in their lives may have to patients' relapse or recovery. Patients will be assisted in exploring ways to respond to the emotions of others without this contributing to a relapse.  Therapeutic Goals: 1. Patient will identify two or more emotions that lead to relapse for them:  2. Patient will identify two emotions that result when they relapse:  3. Patient will identify two emotions related to recovery:  4. Patient will demonstrate ability to communicate their needs through discussion and/or role plays.   Summary of Patient Progress:Pt came to group late and then got up and left early.  She did not participate while she was in the room.     Therapeutic Modalities:   Cognitive Behavioral Therapy Solution-Focused Therapy Assertiveness Training Relapse Prevention Therapy  Daleen SquibbGreg Oberon Hehir, LCSW

## 2017-05-08 NOTE — Progress Notes (Signed)
[facility] MD Progress Note  05/08/2017 1:02 PM Dashayla Theissen  MRN:  [id number] Subjective: Patient is seen and examined.  Patient is a 27 year old female with a past psychiatric history significant for major depression, posttraumatic stress disorder, alcohol abuse, obstructive sleep apnea, diabetes mellitus poorly controlled, hypertension and tachycardia.  She is seen in follow-up.  She has stayed in her room most of the a.m.  I asked her why.  She states she was just feeling weak and tired.  Yesterday her blood pressure remained elevated and her blood sugars remained elevated as well.  Her Metformin was increased to 1000 mg p.o. twice daily.  Additionally low-dose lisinopril was added as an ACE inhibitor for kidney protection and diabetic.  Her blood pressure has improved slightly, but she remains significantly tachycardic.  We discussed the possibility of low-dose beta-blocker to slow that down.  She stated that she is somewhat depressed and anxious.  He denied any suicidal or homicidal ideation.  No psychotic symptoms.  She did sleep 7 hours last night. Principal Problem: MDD (major depressive disorder), severe ([place]) Diagnosis:   Patient Active Problem List   Diagnosis Date Noted  . MDD (major depressive disorder), severe ([place]) [F32.2] 05/05/2017  . Alcohol abuse [F10.10] 04/30/2017  . Umbilical hernia without obstruction and without gangrene [K42.9] 02/25/2017  . Obstructive sleep apnea [G47.33] 12/11/2016  . Diabetes mellitus without complication ([place]) [C78.9] 01/23/2016  . Hypertension [I10] 01/23/2016  . Dysmenorrhea [N94.6] 12/25/2013  . Rectal bleeding [K62.5] 10/20/2012  . Depression, recurrent ([place]) [F33.9] 01/05/2009  . BREAST PAIN, BILATERAL [N64.4] 01/05/2009  . INTERTRIGO, CANDIDAL [L53.8] 01/05/2009  . ABSENCE OF MENSTRUATION [N91.2] 09/20/2008  . DELAYED MENSES [N94.9] 08/25/2008  . ASCUS PAP [R87.610] 05/05/2008  . OBESITY, CLASS III [E66.01] 03/26/2006  . HYPERTENSION, BENIGN  SYSTEMIC [I10] 03/26/2006   Total Time spent with patient: 20 minutes  Past Psychiatric History: See admission H&P  Past Medical History:  Past Medical History:  Diagnosis Date  . Asthma   . Depression   . Diabetes mellitus without complication ([place])   . Hypertension   . Morbid obesity ([place])   . Sleep apnea   . Thyroid disease     Past Surgical History:  Procedure Laterality Date  . ADENOIDECTOMY    . TONSILLECTOMY     Family History:  Family History  Problem Relation Age of Onset  . Hypertension Mother   . Diabetes Mother   . Colon cancer Father   . Hypertension Maternal Grandmother   . Hypertension Paternal Grandmother   . Diabetes Paternal Grandmother   . Breast cancer Cousin    Family Psychiatric  History: See admission H&P Social History:  Social History   Substance and Sexual Activity  Alcohol Use No     Social History   Substance and Sexual Activity  Drug Use No    Social History   Socioeconomic History  . Marital status: Single    Spouse name: Not on file  . Number of children: 0  . Years of education: Not on file  . Highest education level: Not on file  Occupational History    Employer: [employer]  Social Needs  . Financial resource strain: Not on file  . Food insecurity:    Worry: Not on file    Inability: Not on file  . Transportation needs:    Medical: Not on file    Non-medical: Not on file  Tobacco Use  . Smoking status: Former Smoker    Types: Cigarettes  .  Smokeless tobacco: Never Used  Substance and Sexual Activity  . Alcohol use: No  . Drug use: No  . Sexual activity: Yes    Birth control/protection: None    Comment: No form of birth control used.  Lifestyle  . Physical activity:    Days per week: Not on file    Minutes per session: Not on file  . Stress: Not on file  Relationships  . Social connections:    Talks on phone: Not on file    Gets together: Not on file    Attends religious service: Not on file    Active member  of club or organization: Not on file    Attends meetings of clubs or organizations: Not on file    Relationship status: Not on file  Other Topics Concern  . Not on file  Social History Narrative  . Not on file   Additional Social History:                         Sleep: Fair  Appetite:  Fair  Current Medications: Current Facility-Administered Medications  Medication Dose Route Frequency Provider Last Rate Last Dose  . acetaminophen (TYLENOL) tablet 650 mg  650 mg Oral Q6H PRN Money, Lowry Ram, FNP      . alum & mag hydroxide-simeth (MAALOX/MYLANTA) 200-200-20 MG/5ML suspension 30 mL  30 mL Oral Q4H PRN Money, Darnelle Maffucci B, FNP      . hydrochlorothiazide (HYDRODIURIL) tablet 25 mg  25 mg Oral Daily Sharma Covert, MD   25 mg at 05/08/17 1962  . hydrOXYzine (ATARAX/VISTARIL) tablet 25 mg  25 mg Oral TID PRN Money, Lowry Ram, FNP   25 mg at 05/07/17 2122  . lisinopril (PRINIVIL,ZESTRIL) tablet 5 mg  5 mg Oral Daily Sharma Covert, MD   5 mg at 05/08/17 0824  . loperamide (IMODIUM) capsule 2 mg  2 mg Oral PRN Lindon Romp A, NP      . LORazepam (ATIVAN) tablet 1 mg  1 mg Oral Q6H PRN Sharma Covert, MD      . magnesium hydroxide (MILK OF MAGNESIA) suspension 30 mL  30 mL Oral Daily PRN Money, Darnelle Maffucci B, FNP      . metFORMIN (GLUCOPHAGE) tablet 1,000 mg  1,000 mg Oral BID WC Sharma Covert, MD   1,000 mg at 05/08/17 2297  . metoprolol succinate (TOPROL-XL) 24 hr tablet 12.5 mg  12.5 mg Oral Daily Sharma Covert, MD      . phenazopyridine (PYRIDIUM) tablet 100 mg  100 mg Oral TID WC Sharma Covert, MD   100 mg at 05/08/17 [id number]  . prazosin (MINIPRESS) capsule 2 mg  2 mg Oral QHS Money, Lowry Ram, FNP   2 mg at 05/07/17 2122  . sertraline (ZOLOFT) tablet 50 mg  50 mg Oral Daily Money, Lowry Ram, FNP   50 mg at 05/08/17 1194  . traZODone (DESYREL) tablet 100 mg  100 mg Oral QHS PRN Money, Lowry Ram, FNP   100 mg at 05/07/17 2122    Lab Results:  Results for orders  placed or performed during the hospital encounter of 05/05/17 (from the past 48 hour(s))  Glucose, capillary     Status: Abnormal   Collection Time: 05/07/17  6:08 AM  Result Value Ref Range   Glucose-Capillary 169 (H) 65 - 99 mg/dL   Comment 1 Notify RN    Comment 2 Document in Chart   Basic metabolic  panel     Status: Abnormal   Collection Time: 05/07/17  6:31 AM  Result Value Ref Range   Sodium 136 135 - 145 mmol/L   Potassium 3.8 3.5 - 5.1 mmol/L   Chloride 102 101 - 111 mmol/L   CO2 24 22 - 32 mmol/L   Glucose, Bld 188 (H) 65 - 99 mg/dL   BUN 11 6 - 20 mg/dL   Creatinine, Ser 0.73 0.44 - 1.00 mg/dL   Calcium 9.2 8.9 - 10.3 mg/dL   GFR calc non Af Amer >60 >60 mL/min   GFR calc Af Amer >60 >60 mL/min    Comment: (NOTE) The eGFR has been calculated using the CKD EPI equation. This calculation has not been validated in all clinical situations. eGFR's persistently <60 mL/min signify possible Chronic Kidney Disease.    Anion gap 10 5 - 15    Comment: Performed at [facility], [place] [address] [place], [place] [address]  Glucose, capillary     Status: Abnormal   Collection Time: 05/08/17  6:05 AM  Result Value Ref Range   Glucose-Capillary 146 (H) 65 - 99 mg/dL    Blood Alcohol level:  Lab Results  Component Value Date   ETH <10 48/25/0037    Metabolic Disorder Labs: Lab Results  Component Value Date   HGBA1C 9.2 (H) 05/06/2017   MPG 217.34 05/06/2017   MPG 217 (H) 06/28/2013   No results found for: PROLACTIN Lab Results  Component Value Date   CHOL 192 10/01/2016   TRIG 102.0 10/01/2016   HDL 38.30 (L) 10/01/2016   CHOLHDL 5 10/01/2016   VLDL 20.4 10/01/2016   LDLCALC 133 (H) 10/01/2016   LDLCALC 104 (H) 06/29/2013    Physical Findings: AIMS: Facial and Oral Movements Muscles of Facial Expression: None, normal Lips and Perioral Area: None, normal Jaw: None, normal Tongue: None, normal,Extremity Movements Upper (arms, wrists, hands,  fingers): None, normal Lower (legs, knees, ankles, toes): None, normal, Trunk Movements Neck, shoulders, hips: None, normal, Overall Severity Severity of abnormal movements (highest score from questions above): None, normal Incapacitation due to abnormal movements: None, normal Patient's awareness of abnormal movements (rate only patient's report): No Awareness, Dental Status Current problems with teeth and/or dentures?: No Does patient usually wear dentures?: No  CIWA:  CIWA-Ar Total: 0 COWS:     Musculoskeletal: Strength & Muscle Tone: within normal limits Gait & Station: normal Patient leans: N/A  Psychiatric Specialty Exam: Physical Exam  Nursing note and vitals reviewed. Constitutional: She is oriented to person, place, and time. She appears well-developed and well-nourished.  HENT:  Head: Normocephalic and atraumatic.  Respiratory: Effort normal.  Musculoskeletal: Normal range of motion.  Neurological: She is alert and oriented to person, place, and time.    ROS  Blood pressure 123/64, pulse (!) 127, temperature 97.9 F (36.6 C), temperature source Oral, resp. rate 16, height 5' 5.5" (1.664 m), weight (!) 216.4 kg (477 lb), last menstrual period 04/13/2017, SpO2 97 %.Body mass index is 78.17 kg/m.  General Appearance: Casual  Eye Contact:  Minimal  Speech:  Normal Rate  Volume:  Normal  Mood:  Depressed  Affect:  Congruent  Thought Process:  Coherent  Orientation:  Full (Time, Place, and Person)  Thought Content:  Logical  Suicidal Thoughts:  No  Homicidal Thoughts:  No  Memory:  Immediate;   Fair  Judgement:  Intact  Insight:  Lacking  Psychomotor Activity:  Psychomotor Retardation  Concentration:  Concentration: Fair  Recall:  [place] of Knowledge:  Fair  Language:  Good  Akathisia:  No  Handed:  Right  AIMS (if indicated):     Assets:  Desire for Improvement  ADL's:  Intact  Cognition:  WNL  Sleep:  Number of Hours: 6.75     Treatment Plan  Summary: Daily contact with patient to assess and evaluate symptoms and progress in treatment, Medication management and Plan Patient is seen and examined.  Patient is a 27 year old female with the above-stated past psychiatric history seen in follow-up.  She states she is feeling dizzy today, and has been attending groups.  She is a bit withdrawn.  Before I change any of her psychiatric medicines or think we need to assess a few things.  She gets the prazosin for nightmares and flashbacks, and had lisinopril added yesterday for blood pressure.  I am going to get an EKG on her and make sure that her rhythm is not abnormal with the prazosin and her other medications.  Her blood pressure is stable, and her blood sugar is stable as well.  She does seem more depressed, but before I change any of her psych meds on a make sure that her EKG is within normal limits.  We are planning on getting a chemistry panel tomorrow because of the addition of the ace inhibitors.  I am to hold off on adding the metoprolol until I see what her rhythm looks like.  She has requested a leave of absence for work for several weeks, and I have said no about that.  Some of this may be related to the anxious over that decline.  Sharma Covert, MD 05/08/2017, 1:02 PM

## 2017-05-09 LAB — GLUCOSE, CAPILLARY: GLUCOSE-CAPILLARY: 159 mg/dL — AB (ref 65–99)

## 2017-05-09 LAB — BASIC METABOLIC PANEL
ANION GAP: 11 (ref 5–15)
BUN: 10 mg/dL (ref 6–20)
CO2: 26 mmol/L (ref 22–32)
Calcium: 9.4 mg/dL (ref 8.9–10.3)
Chloride: 100 mmol/L — ABNORMAL LOW (ref 101–111)
Creatinine, Ser: 0.81 mg/dL (ref 0.44–1.00)
Glucose, Bld: 171 mg/dL — ABNORMAL HIGH (ref 65–99)
POTASSIUM: 3.8 mmol/L (ref 3.5–5.1)
SODIUM: 137 mmol/L (ref 135–145)

## 2017-05-09 NOTE — [facility] Group Notes (Signed)
LCSW Group Therapy Note 05/09/2017 1:41 PM  Type of Therapy/Topic: Group Therapy: Emotion Regulation  Participation Level: Active   Description of Group:  The purpose of this group is to assist patients in learning to regulate negative emotions and experience positive emotions. Patients will be guided to discuss ways in which they have been vulnerable to their negative emotions. These vulnerabilities will be juxtaposed with experiences of positive emotions or situations, and patients will be challenged to use positive emotions to combat negative ones. Special emphasis will be placed on coping with negative emotions in conflict situations, and patients will process healthy conflict resolution skills.  Therapeutic Goals: 1. Patient will identify two positive emotions or experiences to reflect on in order to balance out negative emotions 2. Patient will label two or more emotions that they find the most difficult to experience 3. Patient will demonstrate positive conflict resolution skills through discussion and/or role plays  Summary of Patient Progress:  Cala BradfordKimberly was engaged and participated throughout the group session. She contributed to the group session regarding anger management.    Therapeutic Modalities:  Cognitive Behavioral Therapy Feelings Identification Dialectical Behavioral Therapy   Alcario DroughtJolan Jessilynn Taft LCSWA Clinical Social Worker

## 2017-05-09 NOTE — Progress Notes (Signed)
[facility]  05/09/2017 12:55 PM Vivien RossettiKimberly Sather  MRN:  [id number] Subjective: Is seen and examined.  Patient's 27 year old female with a past psychiatric history significant for major depression, posttraumatic stress disorder, alcohol abuse, obstructive sleep apnea, diabetes mellitus (poorly controlled), hypertension and tachycardia.  She is seen in follow-up.  She is doing better today.  She attended most groups.  She states she really appreciated the anger management group.  She stated she benefited from that.  They drew her blood last night for her HIV test.  Her blood sugars 159 this morning.  Her blood pressure and heart rate are improved.  Her EKG yesterday just showed a normal sinus rhythm.  She denied any side effects to her current medications.  She stated her mood was about a 7 out of 10.  She denied any suicidal ideation. Principal Problem: MDD (major depressive disorder), severe (HCC) Diagnosis:   Patient Active Problem List   Diagnosis Date Noted  . Tachycardia [R00.0]   . MDD (major depressive disorder), severe (HCC) [F32.2] 05/05/2017  . Alcohol abuse [F10.10] 04/30/2017  . Umbilical hernia without obstruction and without gangrene [K42.9] 02/25/2017  . Obstructive sleep apnea [G47.33] 12/11/2016  . Diabetes mellitus without complication (HCC) [E11.9] 01/23/2016  . Hypertension [I10] 01/23/2016  . Dysmenorrhea [N94.6] 12/25/2013  . Rectal bleeding [K62.5] 10/20/2012  . Depression, recurrent (HCC) [F33.9] 01/05/2009  . BREAST PAIN, BILATERAL [N64.4] 01/05/2009  . INTERTRIGO, CANDIDAL [L53.8] 01/05/2009  . ABSENCE OF MENSTRUATION [N91.2] 09/20/2008  . DELAYED MENSES [N94.9] 08/25/2008  . ASCUS PAP [R87.610] 05/05/2008  . OBESITY, CLASS III [E66.01] 03/26/2006  . HYPERTENSION, BENIGN SYSTEMIC [I10] 03/26/2006   Total Time spent with patient: 20 minutes  Past Psychiatric History: See admission H&P  Past Medical History:  Past Medical History:  Diagnosis Date  .  Asthma   . Depression   . Diabetes mellitus without complication (HCC)   . Hypertension   . Morbid obesity (HCC)   . Sleep apnea   . Thyroid disease     Past Surgical History:  Procedure Laterality Date  . ADENOIDECTOMY    . TONSILLECTOMY     Family History:  Family History  Problem Relation Age of Onset  . Hypertension Mother   . Diabetes Mother   . Colon cancer Father   . Hypertension Maternal Grandmother   . Hypertension Paternal Grandmother   . Diabetes Paternal Grandmother   . Breast cancer Cousin    Family Psychiatric  History: See admission H&P Social History:  Social History   Substance and Sexual Activity  Alcohol Use No     Social History   Substance and Sexual Activity  Drug Use No    Social History   Socioeconomic History  . Marital status: Single    Spouse name: Not on file  . Number of children: 0  . Years of education: Not on file  . Highest education level: Not on file  Occupational History    Employer: [employer]  Social Needs  . Financial resource strain: Not on file  . Food insecurity:    Worry: Not on file    Inability: Not on file  . Transportation needs:    Medical: Not on file    Non-medical: Not on file  Tobacco Use  . Smoking status: Former Smoker    Types: Cigarettes  . Smokeless tobacco: Never Used  Substance and Sexual Activity  . Alcohol use: No  . Drug use: No  . Sexual activity: Yes  Birth control/protection: None    Comment: No form of birth control used.  Lifestyle  . Physical activity:    Days per week: Not on file    Minutes per session: Not on file  . Stress: Not on file  Relationships  . Social connections:    Talks on phone: Not on file    Gets together: Not on file    Attends religious service: Not on file    Active member of club or organization: Not on file    Attends meetings of clubs or organizations: Not on file    Relationship status: Not on file  Other Topics Concern  . Not on file  Social History  Narrative  . Not on file   Additional Social History:                         Sleep: Good  Appetite:  Good  Current Medications: Current Facility-Administered Medications  Medication Dose Route Frequency Provider Last Rate Last Dose  . acetaminophen (TYLENOL) tablet 650 mg  650 mg Oral Q6H PRN Money, Gerlene Burdock, FNP      . alum & mag hydroxide-simeth (MAALOX/MYLANTA) 200-200-20 MG/5ML suspension 30 mL  30 mL Oral Q4H PRN Money, Feliz Beam B, FNP      . hydrochlorothiazide (HYDRODIURIL) tablet 25 mg  25 mg Oral Daily Antonieta Pert, MD   25 mg at 05/09/17 0749  . hydrOXYzine (ATARAX/VISTARIL) tablet 25 mg  25 mg Oral TID PRN Money, Gerlene Burdock, FNP   25 mg at 05/09/17 0752  . lisinopril (PRINIVIL,ZESTRIL) tablet 5 mg  5 mg Oral Daily Antonieta Pert, MD   5 mg at 05/09/17 0749  . loperamide (IMODIUM) capsule 2 mg  2 mg Oral PRN Nira Conn A, NP      . LORazepam (ATIVAN) tablet 1 mg  1 mg Oral Q6H PRN Antonieta Pert, MD      . magnesium hydroxide (MILK OF MAGNESIA) suspension 30 mL  30 mL Oral Daily PRN Money, Feliz Beam B, FNP      . metFORMIN (GLUCOPHAGE) tablet 1,000 mg  1,000 mg Oral BID WC Antonieta Pert, MD   1,000 mg at 05/09/17 0749  . metoprolol succinate (TOPROL-XL) 24 hr tablet 12.5 mg  12.5 mg Oral Daily Antonieta Pert, MD   12.5 mg at 05/09/17 0750  . phenazopyridine (PYRIDIUM) tablet 100 mg  100 mg Oral TID WC Antonieta Pert, MD   100 mg at 05/09/17 1207  . prazosin (MINIPRESS) capsule 1 mg  1 mg Oral QHS Antonieta Pert, MD   1 mg at 05/08/17 2147  . sertraline (ZOLOFT) tablet 50 mg  50 mg Oral Daily Money, Travis B, FNP   50 mg at 05/09/17 0750  . traZODone (DESYREL) tablet 100 mg  100 mg Oral QHS PRN Money, Gerlene Burdock, FNP   100 mg at 05/08/17 2147    Lab Results:  Results for orders placed or performed during the hospital encounter of 05/05/17 (from the past 48 hour(s))  Glucose, capillary     Status: Abnormal   Collection Time: 05/08/17  6:05 AM   Result Value Ref Range   Glucose-Capillary 146 (H) 65 - 99 mg/dL  Glucose, capillary     Status: Abnormal   Collection Time: 05/09/17  6:07 AM  Result Value Ref Range   Glucose-Capillary 159 (H) 65 - 99 mg/dL    Blood Alcohol level:  Lab Results  Component  Value Date   ETH <10 05/04/2017    Metabolic Disorder Labs: Lab Results  Component Value Date   HGBA1C 9.2 (H) 05/06/2017   MPG 217.34 05/06/2017   MPG 217 (H) 06/28/2013   No results found for: PROLACTIN Lab Results  Component Value Date   CHOL 192 10/01/2016   TRIG 102.0 10/01/2016   HDL 38.30 (L) 10/01/2016   CHOLHDL 5 10/01/2016   VLDL 20.4 10/01/2016   LDLCALC 133 (H) 10/01/2016   LDLCALC 104 (H) 06/29/2013    Physical Findings: AIMS: Facial and Oral Movements Muscles of Facial Expression: None, normal Lips and Perioral Area: None, normal Jaw: None, normal Tongue: None, normal,Extremity Movements Upper (arms, wrists, hands, fingers): None, normal Lower (legs, knees, ankles, toes): None, normal, Trunk Movements Neck, shoulders, hips: None, normal, Overall Severity Severity of abnormal movements (highest score from questions above): None, normal Incapacitation due to abnormal movements: None, normal Patient's awareness of abnormal movements (rate only patient's report): No Awareness, Dental Status Current problems with teeth and/or dentures?: No Does patient usually wear dentures?: No  CIWA:  CIWA-Ar Total: 0 COWS:     Musculoskeletal: Strength & Muscle Tone: within normal limits Gait & Station: normal Patient leans: N/A  Psychiatric Specialty Exam: Physical Exam  Nursing note and vitals reviewed. Constitutional: She is oriented to person, place, and time. She appears well-developed and well-nourished.  HENT:  Head: Normocephalic and atraumatic.  Respiratory: Effort normal.  Musculoskeletal: Normal range of motion.  Neurological: She is alert and oriented to person, place, and time.    ROS   Blood pressure 118/79, pulse (!) 101, temperature 98.1 F (36.7 C), temperature source Oral, resp. rate 16, height 5' 5.5" (1.664 m), weight (!) 216.4 kg (477 lb), last menstrual period 04/13/2017, SpO2 97 %.Body mass index is 78.17 kg/m.  General Appearance: Casual  Eye Contact:  Good  Speech:  Clear and Coherent  Volume:  Normal  Mood:  Euthymic  Affect:  Appropriate  Thought Process:  Coherent  Orientation:  Full (Time, Place, and Person)  Thought Content:  Logical  Suicidal Thoughts:  No  Homicidal Thoughts:  No  Memory:  Immediate;   Fair  Judgement:  Fair  Insight:  Fair  Psychomotor Activity:  Normal  Concentration:  Concentration: Fair  Recall:  [employer] of Knowledge:  Fair  Language:  Good  Akathisia:  No  Handed:  Right  AIMS (if indicated):     Assets:  Communication Skills Desire for Improvement Financial Resources/Insurance Housing Resilience Transportation  ADL's:  Intact  Cognition:  WNL  Sleep:  Number of Hours: 6.75     Treatment Plan Summary: Daily contact with patient to assess and evaluate symptoms and progress in treatment, Medication management and Plan Patient is seen and examined.  Patient is a 27 year old female with the above-stated past psychiatric history seen in follow-up.  She continues to slowly improve.  We will continue her sertraline, prazosin, her blood sugars are stable on metformin at thousand milligrams p.o. twice daily.  Her blood pressure and heart rate are better with the addition of the metoprolol XL.  No change in her current medications.  If everything goes well over the next 24 hours will anticipate discharge either on 4/14 or 4/15.  Trazodone and hydroxyzine as currently written.  Antonieta Pert, MD 05/09/2017, 12:55 PM

## 2017-05-09 NOTE — Progress Notes (Signed)
D:  Patient's self inventory sheet, patient has poor sleep, no sleep medication.  Fair appetite, low energy level, poor concentration.  Rated depression and hopeless 9, anxiety 7.  Denied withdrawals.  Denied SI.  Physical problems, lightheaded.  Denied physical pain.  Goal is to try to open up more.  Plans to attend groups.  No discharge plans. A:  Medications administered per MD orders.  Emotional support and encouragement given patient. R:  Denied SI and HI, contracts for safety.  Denied A/V hallucinations.  Safety maintained with 15 minute checks.

## 2017-05-09 NOTE — [facility] Group Notes (Signed)
Identifying Needs   Date:  05/09/2017  Time:  6:39 PM  Type of Therapy:  Nurse Education /  The group focuses on teaching patients how to identify their needs and then how to develop the skills needed to get those needs met.  Participation Level:  Did Not Attend  Participation Quality:    Affect:    Cognitive:    Insight:    Engagement in Group:    Modes of Intervention:    Summary of Progress/Problems:  Lauralyn Primes 05/09/2017, 6:39 PM

## 2017-05-09 NOTE — Plan of Care (Signed)
Nurse discussed anxiety, depression, coping skills with patient. 

## 2017-05-10 LAB — GLUCOSE, CAPILLARY: GLUCOSE-CAPILLARY: 137 mg/dL — AB (ref 65–99)

## 2017-05-10 MED ORDER — HYDROCHLOROTHIAZIDE 25 MG PO TABS: 25.0000 mg | ORAL_TABLET | Freq: Every day | ORAL | 0 refills | Status: DC

## 2017-05-10 MED ORDER — ALBUTEROL SULFATE HFA 108 (90 BASE) MCG/ACT IN AERS: 2.0000 | INHALATION_SPRAY | Freq: Four times a day (QID) | RESPIRATORY_TRACT | 2 refills | Status: DC | PRN

## 2017-05-10 MED ORDER — PHENAZOPYRIDINE HCL 100 MG PO TABS: 100.0000 mg | ORAL_TABLET | Freq: Three times a day (TID) | ORAL | 0 refills | Status: DC

## 2017-05-10 MED ORDER — METOPROLOL SUCCINATE ER 25 MG PO TB24: 12.5000 mg | ORAL_TABLET | Freq: Every day | ORAL | 0 refills | Status: DC

## 2017-05-10 MED ORDER — BUPROPION HCL ER (XL) 150 MG PO TB24: 150.0000 mg | ORAL_TABLET | Freq: Every day | ORAL | 0 refills | Status: DC

## 2017-05-10 MED ORDER — METFORMIN HCL 1000 MG PO TABS: 1000.0000 mg | ORAL_TABLET | Freq: Two times a day (BID) | ORAL | 0 refills | Status: DC

## 2017-05-10 MED ORDER — LISINOPRIL 5 MG PO TABS: 5.0000 mg | ORAL_TABLET | Freq: Every day | ORAL | 0 refills | Status: DC

## 2017-05-10 MED ORDER — PRAZOSIN HCL 1 MG PO CAPS: 1.0000 mg | ORAL_CAPSULE | Freq: Every day | ORAL | 0 refills | Status: DC

## 2017-05-10 MED ORDER — TRAZODONE HCL 100 MG PO TABS: 100.0000 mg | ORAL_TABLET | Freq: Every evening | ORAL | 0 refills | Status: DC | PRN

## 2017-05-10 MED ORDER — HYDROXYZINE HCL 25 MG PO TABS: 25.0000 mg | ORAL_TABLET | Freq: Three times a day (TID) | ORAL | 0 refills | Status: DC | PRN

## 2017-05-10 MED ORDER — SERTRALINE HCL 50 MG PO TABS: 50.0000 mg | ORAL_TABLET | Freq: Every day | ORAL | 0 refills | Status: DC
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
## 2017-05-10 NOTE — BHH Suicide Risk Assessment (Signed)
[facility]   Principal Problem: MDD (major depressive disorder), severe [facility](HCC) Discharge Diagnoses:  Patient Active Problem List   Diagnosis Date Noted  . Tachycardia [R00.0]   . MDD (major depressive disorder), severe (HCC) [F32.2] 05/05/2017  . Alcohol abuse [F10.10] 04/30/2017  . Umbilical hernia without obstruction and without gangrene [K42.9] 02/25/2017  . Obstructive sleep apnea [G47.33] 12/11/2016  . Diabetes mellitus without complication (HCC) [E11.9] 01/23/2016  . Hypertension [I10] 01/23/2016  . Dysmenorrhea [N94.6] 12/25/2013  . Rectal bleeding [K62.5] 10/20/2012  . Depression, recurrent (HCC) [F33.9] 01/05/2009  . BREAST PAIN, BILATERAL [N64.4] 01/05/2009  . INTERTRIGO, CANDIDAL [L53.8] 01/05/2009  . ABSENCE OF MENSTRUATION [N91.2] 09/20/2008  . DELAYED MENSES [N94.9] 08/25/2008  . ASCUS PAP [R87.610] 05/05/2008  . OBESITY, CLASS III [E66.01] 03/26/2006  . HYPERTENSION, BENIGN SYSTEMIC [I10] 03/26/2006    Total Time spent with patient: 30 minutes  Musculoskeletal: Strength & Muscle Tone: within normal limits Gait & Station: normal Patient leans: N/A  Psychiatric Specialty Exam: Review of Systems  Neurological: Positive for headaches.  All other systems reviewed and are negative.   Blood pressure (!) 131/102, pulse (!) 120, temperature 98.2 F (36.8 C), temperature source Oral, resp. rate 16, height 5' 5.5" (1.664 m), weight (!) 216.4 kg (477 lb), last menstrual period 04/13/2017, SpO2 97 %.Body mass index is 78.17 kg/m.  General Appearance: Casual  Eye Contact::  Good  Speech:  Clear and Coherent409  Volume:  Normal  Mood:  Euthymic  Affect:  Congruent  Thought Process:  Coherent  Orientation:  Full (Time, Place, and Person)  Thought Content:  Logical  Suicidal Thoughts:  No  Homicidal Thoughts:  No  Memory:  Immediate;   Fair  Judgement:  Good  Insight:  Good  Psychomotor Activity:  Normal  Concentration:  Good  Recall:   Good  Fund of Knowledge:Good  Language: Good  Akathisia:  No  Handed:  Right  AIMS (if indicated):     Assets:  Communication Skills Desire for Improvement Financial Resources/Insurance Housing Resilience Social Support  Sleep:  Number of Hours: 6.25  Cognition: WNL  ADL's:  Intact   Mental Status Per Nursing Assessment::   On Admission:     Demographic Factors:  Adolescent or young adult and Low socioeconomic status  Loss Factors: NA  Historical Factors: Impulsivity  Risk Reduction Factors:   Sense of responsibility to family, Employed, Living with another person, especially a relative and Positive social support  Continued Clinical Symptoms:  Previous Psychiatric Diagnoses and Treatments  Cognitive Features That Contribute To Risk:  None    Suicide Risk:  Minimal: No identifiable suicidal ideation.  Patients presenting with no risk factors but with morbid ruminations; may be classified as minimal risk based on the severity of the depressive symptoms  Follow-up Information    [facility], [facility] [place]. Go on 05/13/2017.   Why:  Hospital discharge appointment 05/13/17 at 8:30am. Contact information: [address] [place] [address] [phone]           Plan Of Care/Follow-up recommendations:  Activity:  ad lib  Antonieta PertGreg Lawson Andjela Wickes, MD 05/10/2017, 11:40 AM

## 2017-05-10 NOTE — BHH Group Notes (Addendum)
     Date:  05/10/2017  Time:         Identifying Needs     2:24 PM  Type of Therapy:  Nurse Education  /  The group is focused on teaching patients how to identify their needs and how to develop the skills needed to get their needs met.  Participation Level:  Active  Participation Quality:  Attentive  Affect:  Appropriate  Cognitive:  Appropriate  Insight:  Appropriate  Engagement in Group:    Modes of Intervention:  Education  Summary of Progress/Problems:  ,  Laura Benson 05/10/2017, 2:24 PM 

## 2017-05-10 NOTE — Progress Notes (Signed)
Writer has observed patient up in the dayroom laughing and talking with select peers. She reports having had a good day. Writer informed her scheduled medication and she requested the medication she got on last night that helped her sleep well. Writer inquired if she had a goal she was working towards and she reports that groups being very helpful and will be using some of the coping skills she has learned. Support given and safety maintained on unit with 15 min checks.

## 2017-05-10 NOTE — Progress Notes (Signed)
Discharge Note:  Patient discharged.  Transportation will take patient to [facility].  Patient denied SI and HI.  Denied A/V hallucinations. Suicide prevention information given and discussed with patient who stated she understood and had no questions.  Patient stated she received all her belongings, clothing, toiletries, prescriptions, etc.  Patient stated she appreciated all assistance received by [facility].  All required discharge information given to patient at discharge.

## 2017-05-10 NOTE — Progress Notes (Signed)
  [facility] :  Will you be returning to the same living situation after discharge:  Yes,  own home At discharge, do you have transportation home?: Yes,  own car Do you have the ability to pay for your medications: Yes,  [employer]  Release of information consent forms completed and in the chart;  Patient's signature needed at discharge.  Patient to Follow up at: Follow-up Information    [facility], [facility] [place]. Go on 05/13/2017.   Why:  Hospital discharge appointment 05/13/17 at 8:30am. Contact information: [address] [place] [address] [phone]           Next level of care provider has access to [facility] Link:no  Safety Planning and Suicide Prevention discussed: Yes,  with father  Have you used any form of tobacco in the last 30 days? (Cigarettes, Smokeless Tobacco, Cigars, and/or Pipes): No  Has patient been referred to the Quitline?: N/A patient is not a smoker  Patient has been referred for addiction treatment: Yes  Lorri FrederickWierda, Crue Otero Jon, LCSW 05/10/2017, 12:47 PM

## 2017-05-10 NOTE — BHH Group Notes (Signed)
BHH LCSW Group Therapy Note  Date/Time: 05/10/17, 1000  Type of Therapy/Topic:  Group Therapy:  Feelings about Diagnosis  Participation Level:  Active   Mood:pleasant   Description of Group:    This group will allow patients to explore their thoughts and feelings about diagnoses they have received. Patients will be guided to explore their level of understanding and acceptance of these diagnoses. Facilitator will encourage patients to process their thoughts and feelings about the reactions of others to their diagnosis, and will guide patients in identifying ways to discuss their diagnosis with significant others in their lives. This group will be process-oriented, with patients participating in exploration of their own experiences as well as giving and receiving support and challenge from other group members.   Therapeutic Goals: 1. Patient will demonstrate understanding of diagnosis as evidence by identifying two or more symptoms of the disorder:  2. Patient will be able to express two feelings regarding the diagnosis 3. Patient will demonstrate ability to communicate their needs through discussion and/or role plays  Summary of Patient Progress: Pt was very active in group discussion regarding symptoms of diagnoses, stigma, and personal acceptance of diagnosis. Pt shared that she is in agreement with her current diagnoses of depression and PTSD.          Therapeutic Modalities:   Cognitive Behavioral Therapy Brief Therapy Feelings Identification   Daleen SquibbGreg Mattew Chriswell, LCSW

## 2017-05-10 NOTE — Progress Notes (Signed)
D:  Patient's self inventory sheet, patient has fair sleep, no sleep medication.  Fair appetite, normal energy level, good concentration.  Rated depression 9, hopeless and anxiety 7.  Denied withdrawals.  Denied SI.  Physical problems, dizziness, headaches.  Denied physical pain.  Goal is open up more.  Plans to attend groups.  Does have discharge plans. A:  Medications administered per MD orders.  Emotional support and encouragement given patient. R:  Denied SI and HI, contracts for safety.  Denied A/V hallucinations.  Safety maintained with 15 minute checks.

## 2017-05-11 LAB — HIV-1 RNA, QUALITATIVE, TMA
HIV-1 RNA, QUAL: NEGATIVE
HIV-1 RNA, Qualitative, TMA: NEGATIVE

## 2017-05-13 ENCOUNTER — Encounter: Payer: Self-pay | Admitting: Family Medicine

## 2017-05-14 ENCOUNTER — Ambulatory Visit: Payer: 59 | Admitting: Family Medicine

## 2017-05-25 ENCOUNTER — Encounter: Payer: Self-pay | Admitting: Family Medicine

## 2017-05-25 ENCOUNTER — Ambulatory Visit (INDEPENDENT_AMBULATORY_CARE_PROVIDER_SITE_OTHER): Payer: 59 | Admitting: Family Medicine

## 2017-05-25 VITALS — BP 132/82 | HR 92 | Temp 98.5°F | Ht 66.0 in | Wt >= 6400 oz

## 2017-05-25 DIAGNOSIS — F322 Major depressive disorder, single episode, severe without psychotic features: Secondary | ICD-10-CM | POA: Diagnosis not present

## 2017-05-25 DIAGNOSIS — I10 Essential (primary) hypertension: Secondary | ICD-10-CM

## 2017-05-25 MED ORDER — SERTRALINE HCL 50 MG PO TABS: 50.0000 mg | ORAL_TABLET | Freq: Every day | ORAL | 0 refills | Status: DC

## 2017-05-25 MED ORDER — TRAZODONE HCL 150 MG PO TABS: 150.0000 mg | ORAL_TABLET | Freq: Every day | ORAL | 0 refills | Status: DC

## 2017-05-25 MED ORDER — METOPROLOL SUCCINATE ER 25 MG PO TB24: 25.0000 mg | ORAL_TABLET | Freq: Every day | ORAL | 1 refills | Status: DC

## 2017-05-25 MED ORDER — LISINOPRIL-HYDROCHLOROTHIAZIDE 20-12.5 MG PO TABS
1.0000 | ORAL_TABLET | Freq: Every day | ORAL | 1 refills | Status: DC
Start: 2017-05-25 — End: 2018-06-08

## 2017-05-25 NOTE — Progress Notes (Signed)
Pre visit review using our clinic review tool, if applicable. No additional management support is needed unless otherwise documented below in the visit note. 

## 2017-05-25 NOTE — Patient Instructions (Addendum)
Hang in there.  We will turn over care of your depression/anxiety to the psychiatry team.  Try to clean up the diet and stay active.  Let us know if you need anything.

## 2017-05-25 NOTE — Progress Notes (Signed)
Chief Complaint  Patient presents with  . Follow-up    Subjective: Patient is a 27 y.o. female here for mood f/u.  Patient was evaluated several weeks ago for suicidal ideation and depression.  We have changed some of her medication, however she continued to have issues and eventually went to [facility] and was admitted to the psychiatric team for the same above.  Her medications were tailored and she feels better overall.  She has a follow-up appointment with the psychiatry team on 06/09/2017.  She returned to work today and feels that she may have gone back to soon.  She is not sleeping well despite being on the trazodone and wishes to increase the dose.  She has no current thoughts of harming herself or others.  She states her diet is improving but she is not physically active.  She is tolerating her pressure medicines well.   ROS: Heart: Denies chest pain  Lungs: Denies SOB   Family History  Problem Relation Age of Onset  . Hypertension Mother   . Diabetes Mother   . Colon cancer Father   . Hypertension Maternal Grandmother   . Hypertension Paternal Grandmother   . Diabetes Paternal Grandmother   . Breast cancer Cousin    Past Medical History:  Diagnosis Date  . Asthma   . Depression   . Diabetes mellitus without complication (HCC)   . Hypertension   . Morbid obesity (HCC)   . Sleep apnea   . Thyroid disease    No Known Allergies  Current Outpatient Medications:  .  albuterol (PROVENTIL HFA;VENTOLIN HFA) 108 (90 Base) MCG/ACT inhaler, Inhale 2 puffs into the lungs every 6 (six) hours as needed for wheezing or shortness of breath., Disp: 1 Inhaler, Rfl: 2 .  hydrOXYzine (ATARAX/VISTARIL) 25 MG tablet, Take 1 tablet (25 mg total) by mouth 3 (three) times daily as needed for anxiety., Disp: 60 tablet, Rfl: 0 .  metFORMIN (GLUCOPHAGE) 1000 MG tablet, Take 1 tablet (1,000 mg total) by mouth 2 (two) times daily with a meal. For diabetes, Disp: 30 tablet, Rfl: 0 .   metoprolol succinate (TOPROL-XL) 25 MG 24 hr tablet, Take 1 tablet (25 mg total) by mouth daily. For high blood pressure, Disp: 90 tablet, Rfl: 1 .  prazosin (MINIPRESS) 1 MG capsule, Take 1 capsule (1 mg total) by mouth at bedtime. For nightmares, Disp: 30 capsule, Rfl: 0 .  sertraline (ZOLOFT) 50 MG tablet, Take 1 tablet (50 mg total) by mouth daily. For depression, Disp: 30 tablet, Rfl: 0 .  lisinopril-hydrochlorothiazide (ZESTORETIC) 20-12.5 MG tablet, Take 1 tablet by mouth daily., Disp: 90 tablet, Rfl: 1 .  traZODone (DESYREL) 150 MG tablet, Take 1 tablet (150 mg total) by mouth at bedtime., Disp: 90 tablet, Rfl: 0  Objective: BP 132/82 (BP Location: Right Arm, Patient Position: Sitting, Cuff Size: Large)   Pulse 92   Temp 98.5 F (36.9 C) (Oral)   Ht  (1.676 m)   Wt (!) 475 lb 6 oz (215.6 kg)   SpO2 97%   BMI 76.73 kg/m  General: Awake, appears stated age HEENT: MMM, EOMi Heart: RRR, no LE edema Lungs: CTAB, no rales, wheezes or rhonchi. No accessory muscle use Psych: Age appropriate judgment and insight, flat affect and nml mood  Assessment and Plan: MDD (major depressive disorder), severe (HCC) - Plan: sertraline (ZOLOFT) 50 MG tablet, traZODone (DESYREL) 150 MG tablet  Essential hypertension - Plan: lisinopril-hydrochlorothiazide (ZESTORETIC) 20-12.5 MG tablet, metoprolol succinate (TOPROL-XL) 25  MG 24 hr tablet  Increased dose of trazodone from 100 mg nightly to 150 mg nightly.  Continue same dose of Zoloft. Combined lisinopril hydrochlorothiazide to combination pill and increase metoprolol to a full tablet. Follow-up in 3 months for a diabetic check. The patient voiced understanding and agreement to the plan.  [name] [place], DO 05/25/17  5:02 PM

## 2017-05-27 ENCOUNTER — Encounter: Payer: Self-pay | Admitting: Family Medicine

## 2017-05-29 NOTE — Telephone Encounter (Signed)
Message was left for patient and no return call has been received; paperwork is for [facility] Treating Provider, not PCP/SLS

## 2017-06-01 ENCOUNTER — Encounter: Payer: Self-pay | Admitting: Family Medicine

## 2017-06-16 ENCOUNTER — Telehealth: Payer: Self-pay | Admitting: Family Medicine

## 2017-06-16 NOTE — Telephone Encounter (Signed)
Copied from CRM [id number]. Topic: Quick Communication - See Telephone Encounter >> Jun 16, 2017  4:13 PM Maia Petties wrote: CRM for notification. See Telephone encounter for: 06/16/17.  Pt having some trouble with short term disability claim and is asking for a medical note from her doctor. Pt is still out of work currently. They are asking "what she has" and what is going on with her. Please advise.

## 2017-06-17 ENCOUNTER — Encounter: Payer: Self-pay | Admitting: Family Medicine

## 2017-06-17 NOTE — Telephone Encounter (Signed)
Sent message to pt with request for details

## 2017-06-17 NOTE — Telephone Encounter (Signed)
Dr Patsy Lager -- is this something you can help with in Dr Hollie Beach absence or should this wait on his return?

## 2017-06-19 NOTE — Telephone Encounter (Signed)
[name]- can you please fax her most recent OV note from Dr. Carmelia Roller to the fax number she provided?  [phone] Thank you!

## 2017-06-24 ENCOUNTER — Encounter: Payer: 59 | Admitting: Podiatry

## 2017-07-02 NOTE — Progress Notes (Signed)
This encounter was created in error - please disregard.

## 2017-07-09 ENCOUNTER — Encounter: Payer: Self-pay | Admitting: Family Medicine

## 2017-07-10 ENCOUNTER — Telehealth: Payer: Self-pay | Admitting: Family Medicine

## 2017-07-10 NOTE — Telephone Encounter (Signed)
Copied from CRM [id number]. Topic: General - Other >> Jul 10, 2017 10:49 AM Gerrianne ScalePayne, Angela L wrote: Reason for CRM: pt wanting to speak with Dr Carmelia RollerWendling assistant about her paperwork for short term disability

## 2017-07-10 NOTE — Telephone Encounter (Signed)
Have her schedule appt and we can go over it. I am happy to help, but don't know how to answer some of these. TY.

## 2017-07-10 NOTE — Telephone Encounter (Signed)
Spoke with patient and she stated that her Behavioral Health provider refused to complete part of paperwork that specifically asked for [facility] to complete; she us going to contact [employer] and have them send paperwork to us and I will speak with Dr. Carmelia RollerWendling on Monday, 07/13/17 and then call patient back if he agrees to complete/SLS 06/14      Conversation  [employer](Newest Message First)  May 29, 2017  Me   1:51 PM  Note    Message was left for patient and no return call has been received; [facility], not PCP/SLS      May 08, 2017   1:37 PM  You routed this conversation to Me  Me   1:36 PM  Note    Began Disability paperwork, [facility]; will complete when discharged and more information can be obtained/SLS

## 2017-07-13 ENCOUNTER — Ambulatory Visit (INDEPENDENT_AMBULATORY_CARE_PROVIDER_SITE_OTHER): Payer: 59 | Admitting: Family Medicine

## 2017-07-13 ENCOUNTER — Encounter: Payer: Self-pay | Admitting: Family Medicine

## 2017-07-13 VITALS — BP 128/82 | HR 90 | Temp 99.0°F | Ht 66.0 in | Wt >= 6400 oz

## 2017-07-13 DIAGNOSIS — F322 Major depressive disorder, single episode, severe without psychotic features: Secondary | ICD-10-CM | POA: Diagnosis not present

## 2017-07-13 NOTE — Progress Notes (Signed)
Chief Complaint  Patient presents with  . Follow-up    FMLA    Subjective: Patient is a 27 y.o. female here for paperwork discussion.  Patient is here for discussion of FMLA.  She is following with psychiatry for severe major depressive disorder and attempted suicide, however her psychiatrist was not willing to fill on her paperwork.  She is here to discuss some of the answers.  She is currently back at work so this sounds like it is retroactive.  She was initially released to work at the end of April, however stated this was too soon.  The patient's FMLA was denied because this final form was not filled out.  Her psychiatrist had recommended seeing a disability lawyer which she cannot afford at this time.  She is currently on Atarax as needed, Zoloft, trazodone, and prazosin.  While she was improving overall, having her disability denied caused her to regress.  Current symptoms include poor sleep, anxiety, agitation, decreased concentration, poor ability to cope.  When she does not have to answer questions of numerous coworkers, she does quite a bit better.  She is self-medicating with tobacco, not currently with alcohol though she does want to.  She is also interested in bariatric surgery.  She is gained 20 pounds as her last visit.  Her BMI today 76.  ROS: Psych: As noted in HPI Const: +wt gain  Family History  Problem Relation Age of Onset  . Hypertension Mother   . Diabetes Mother   . Colon cancer Father   . Hypertension Maternal Grandmother   . Hypertension Paternal Grandmother   . Diabetes Paternal Grandmother   . Breast cancer Cousin    Past Medical History:  Diagnosis Date  . Asthma   . Depression   . Diabetes mellitus without complication (HCC)   . Hypertension   . Morbid obesity (HCC)   . Sleep apnea   . Thyroid disease    No Known Allergies  Current Outpatient Medications:  .  albuterol (PROVENTIL HFA;VENTOLIN HFA) 108 (90 Base) MCG/ACT inhaler, Inhale 2 puffs into  the lungs every 6 (six) hours as needed for wheezing or shortness of breath., Disp: 1 Inhaler, Rfl: 2 .  hydrOXYzine (ATARAX/VISTARIL) 25 MG tablet, Take 1 tablet (25 mg total) by mouth 3 (three) times daily as needed for anxiety., Disp: 60 tablet, Rfl: 0 .  lisinopril-hydrochlorothiazide (ZESTORETIC) 20-12.5 MG tablet, Take 1 tablet by mouth daily., Disp: 90 tablet, Rfl: 1 .  metFORMIN (GLUCOPHAGE) 1000 MG tablet, Take 1 tablet (1,000 mg total) by mouth 2 (two) times daily with a meal. For diabetes, Disp: 30 tablet, Rfl: 0 .  metoprolol succinate (TOPROL-XL) 25 MG 24 hr tablet, Take 1 tablet (25 mg total) by mouth daily. For high blood pressure, Disp: 90 tablet, Rfl: 1 .  prazosin (MINIPRESS) 1 MG capsule, Take 1 capsule (1 mg total) by mouth at bedtime. For nightmares, Disp: 30 capsule, Rfl: 0 .  sertraline (ZOLOFT) 50 MG tablet, Take 1 tablet (50 mg total) by mouth daily. For depression, Disp: 30 tablet, Rfl: 0 .  traZODone (DESYREL) 150 MG tablet, Take 1 tablet (150 mg total) by mouth at bedtime., Disp: 90 tablet, Rfl: 0  Objective: BP 128/82 (BP Location: Left Arm, Patient Position: Sitting, Cuff Size: Large)   Pulse 90   Temp 99 F (37.2 C) (Oral)   Ht 5\' 6"  (1.676 m)   Wt (!) 471 lb 6 oz (213.8 kg)   SpO2 97%   BMI 76.08 kg/m  General: Awake, appears stated age Lungs: No accessory muscle use Psych: Age appropriate judgment and insight, normal affect and mood  Assessment and Plan: MDD (major depressive disorder), severe (HCC)  Morbid obesity (HCC) - Plan: Amb Referral to Bariatric Surgery  She looks fairly well today.  I did encourage her to stay away from alcohol.  Try to cut down on smoking.  We will follow the forms to help.  Moving forward, I do not think she needs to be out of work, however if this can help get things approved for prior months, I think that would be reasonable.  Psych resources provided in AVS. Refer to bariatric surgery.  Counseled on diet and exercise. The  patient voiced understanding and agreement to the plan.  [name] [place], DO 07/13/17  12:14 PM

## 2017-07-13 NOTE — Progress Notes (Signed)
Pre visit review using our clinic review tool, if applicable. No additional management support is needed unless otherwise documented below in the visit note. 

## 2017-07-13 NOTE — Telephone Encounter (Signed)
Patient came in today for OV with PCP; paperwork was completed and Original copy given to patient; copy sent to scan/SLS 06/17

## 2017-07-13 NOTE — Patient Instructions (Signed)
Let us know if you need anything.  [facility] [address] [place], [place] [address] [phone]  [facility] [address] [place], [place] [address] [phone]  [facility] [address] [place], [place] [address] [phone]  [facility] [address], [place], [place], #[phone] [address], [place], [place], #[phone]  [facility] [address], [place] [address] [phone]  [facility] [address] [place], [place] [phone]  [facility] [address] [place], [place] [phone]  Call one of these offices sooner than later as it can take 2-3 months to get a new patient appointment.

## 2017-07-14 ENCOUNTER — Telehealth: Payer: Self-pay | Admitting: *Deleted

## 2017-07-14 NOTE — Telephone Encounter (Signed)
Faxed completed Attending Physician's Statement for STD to [employer] at [phone]/SLS 06/18

## 2017-08-24 ENCOUNTER — Ambulatory Visit: Payer: Self-pay | Admitting: Family Medicine

## 2017-08-24 ENCOUNTER — Ambulatory Visit: Payer: 59 | Admitting: Family Medicine

## 2017-08-24 DIAGNOSIS — Z0289 Encounter for other administrative examinations: Secondary | ICD-10-CM

## 2017-09-09 ENCOUNTER — Encounter: Payer: Self-pay | Admitting: Family Medicine

## 2017-12-07 ENCOUNTER — Telehealth: Payer: Self-pay | Admitting: *Deleted

## 2017-12-07 NOTE — Telephone Encounter (Signed)
Received request for Medical Records from [place] Disability Determination Services; forwarded to Medical Records via email/scan/SLS  

## 2018-05-31 ENCOUNTER — Other Ambulatory Visit: Payer: Self-pay

## 2018-05-31 ENCOUNTER — Emergency Department (HOSPITAL_COMMUNITY): Payer: BLUE CROSS/BLUE SHIELD

## 2018-05-31 ENCOUNTER — Emergency Department (HOSPITAL_COMMUNITY)
Admission: EM | Admit: 2018-05-31 | Discharge: 2018-05-31 | Disposition: A | Discharge status: Home / Self Care | Payer: BLUE CROSS/BLUE SHIELD | Attending: Emergency Medicine | Admitting: Emergency Medicine

## 2018-05-31 ENCOUNTER — Encounter (HOSPITAL_COMMUNITY): Payer: Self-pay | Admitting: Emergency Medicine

## 2018-05-31 DIAGNOSIS — R102 Pelvic and perineal pain: Secondary | ICD-10-CM

## 2018-05-31 DIAGNOSIS — N939 Abnormal uterine and vaginal bleeding, unspecified: Secondary | ICD-10-CM | POA: Insufficient documentation

## 2018-05-31 DIAGNOSIS — J45909 Unspecified asthma, uncomplicated: Secondary | ICD-10-CM | POA: Diagnosis not present

## 2018-05-31 DIAGNOSIS — E119 Type 2 diabetes mellitus without complications: Secondary | ICD-10-CM | POA: Insufficient documentation

## 2018-05-31 DIAGNOSIS — Z87891 Personal history of nicotine dependence: Secondary | ICD-10-CM | POA: Diagnosis not present

## 2018-05-31 DIAGNOSIS — Z79899 Other long term (current) drug therapy: Secondary | ICD-10-CM | POA: Insufficient documentation

## 2018-05-31 DIAGNOSIS — I10 Essential (primary) hypertension: Secondary | ICD-10-CM | POA: Insufficient documentation

## 2018-05-31 LAB — CBC WITH DIFFERENTIAL/PLATELET
Abs Immature Granulocytes: 0.02 10*3/uL (ref 0.00–0.07)
Basophils Absolute: 0 10*3/uL (ref 0.0–0.1)
Basophils Relative: 0 %
Eosinophils Absolute: 0.1 10*3/uL (ref 0.0–0.5)
Eosinophils Relative: 2 %
HCT: 41.2 % (ref 36.0–46.0)
Hemoglobin: 12.7 g/dL (ref 12.0–15.0)
Immature Granulocytes: 0 %
Lymphocytes Relative: 30 %
Lymphs Abs: 1.8 10*3/uL (ref 0.7–4.0)
MCH: 23.7 pg — ABNORMAL LOW (ref 26.0–34.0)
MCHC: 30.8 g/dL (ref 30.0–36.0)
MCV: 77 fL — ABNORMAL LOW (ref 80.0–100.0)
Monocytes Absolute: 0.7 10*3/uL (ref 0.1–1.0)
Monocytes Relative: 11 %
Neutro Abs: 3.5 10*3/uL (ref 1.7–7.7)
Neutrophils Relative %: 57 %
Platelets: 273 10*3/uL (ref 150–400)
RBC: 5.35 MIL/uL — ABNORMAL HIGH (ref 3.87–5.11)
RDW: 14.9 % (ref 11.5–15.5)
WBC: 6.2 10*3/uL (ref 4.0–10.5)
nRBC: 0 % (ref 0.0–0.2)

## 2018-05-31 LAB — COMPREHENSIVE METABOLIC PANEL
ALT: 17 U/L (ref 0–44)
AST: 13 U/L — ABNORMAL LOW (ref 15–41)
Albumin: 3.6 g/dL (ref 3.5–5.0)
Alkaline Phosphatase: 63 U/L (ref 38–126)
Anion gap: 6 (ref 5–15)
BUN: 16 mg/dL (ref 6–20)
CO2: 25 mmol/L (ref 22–32)
Calcium: 9.3 mg/dL (ref 8.9–10.3)
Chloride: 106 mmol/L (ref 98–111)
Creatinine, Ser: 0.82 mg/dL (ref 0.44–1.00)
GFR calc Af Amer: >60 mL/min (ref >60)
GFR calc non Af Amer: >60 mL/min (ref >60)
Glucose, Bld: 182 mg/dL — ABNORMAL HIGH (ref 70–99)
Potassium: 4 mmol/L (ref 3.5–5.1)
Sodium: 137 mmol/L (ref 135–145)
Total Bilirubin: 0.3 mg/dL (ref 0.3–1.2)
Total Protein: 6.9 g/dL (ref 6.5–8.1)

## 2018-05-31 LAB — URINALYSIS, ROUTINE W REFLEX MICROSCOPIC
Bilirubin Urine: NEGATIVE
Glucose, UA: NEGATIVE mg/dL
Ketones, ur: NEGATIVE mg/dL
Leukocytes,Ua: NEGATIVE
Nitrite: NEGATIVE
Protein, ur: 30 mg/dL — AB
RBC / HPF: >50 RBC/hpf — ABNORMAL HIGH (ref 0–5)
Specific Gravity, Urine: 1.018 (ref 1.005–1.030)
pH: 5 (ref 5.0–8.0)

## 2018-05-31 LAB — POC URINE PREG, ED: Preg Test, Ur: NEGATIVE

## 2018-05-31 LAB — TYPE AND SCREEN
ABO/RH(D): O POS
Antibody Screen: NEGATIVE

## 2018-05-31 LAB — HCG, QUANTITATIVE, PREGNANCY: hCG, Beta Chain, Quant, S: 1 m[IU]/mL (ref <5)

## 2018-05-31 NOTE — ED Notes (Signed)
Pt returned to room from US.

## 2018-05-31 NOTE — ED Notes (Signed)
Patient transported to Ultrasound 

## 2018-05-31 NOTE — ED Triage Notes (Signed)
Pt in for vaginal bleeding and missed period, faint positive home pregnancy test yesterday.

## 2018-05-31 NOTE — ED Provider Notes (Signed)
Emergency Department Provider Note  ____________________________________________  Time seen: Approximately 3:40 PM  I have reviewed the triage vital signs and the nursing notes.   HISTORY  Chief Complaint Vaginal Bleeding and Possible +preg    HPI Laura Benson is a 28 y.o. female with a history of diabetes, hypertension and umbilical hernia, presents to the emergency department with concern for vaginal bleeding and possible pregnancy.  Last menstrual cycle was April 18, 2018.  Current vaginal bleeding is similar to her menstrual cycle.  Patient reports that her cycle is usually irregular.  She took an at home pregnancy test 5 days ago and interpreted results as being positive.  Patient has experienced vaginal bleeding and pelvic cramping for the past 4 days.  She denies dysuria, increased urinary frequency, fatigue or nausea.  No back pain or fever.  Patient reports that she is trying to get pregnant. No alleviating measures have been attempted at home.         Past Medical History:  Diagnosis Date  . Asthma   . Depression   . Diabetes mellitus without complication (HCC)   . Hypertension   . Morbid obesity (HCC)   . Sleep apnea   . Thyroid disease     Patient Active Problem List   Diagnosis Date Noted  . Tachycardia   . MDD (major depressive disorder), severe (HCC) 05/05/2017  . Alcohol abuse 04/30/2017  . Umbilical hernia without obstruction and without gangrene 02/25/2017  . Obstructive sleep apnea 12/11/2016  . Diabetes mellitus without complication (HCC) 01/23/2016  . Hypertension 01/23/2016  . Dysmenorrhea 12/25/2013  . Rectal bleeding 10/20/2012  . Depression, recurrent (HCC) 01/05/2009  . BREAST PAIN, BILATERAL 01/05/2009  . INTERTRIGO, CANDIDAL 01/05/2009  . ABSENCE OF MENSTRUATION 09/20/2008  . DELAYED MENSES 08/25/2008  . ASCUS PAP 05/05/2008  . OBESITY, CLASS III 03/26/2006  . HYPERTENSION, BENIGN SYSTEMIC 03/26/2006    Past Surgical History:   Procedure Laterality Date  . ADENOIDECTOMY    . TONSILLECTOMY      Prior to Admission medications   Medication Sig Start Date End Date Taking? Authorizing Provider  albuterol (PROVENTIL HFA;VENTOLIN HFA) 108 (90 Base) MCG/ACT inhaler Inhale 2 puffs into the lungs every 6 (six) hours as needed for wheezing or shortness of breath. 05/10/17   Armandina Stammer I, NP  hydrOXYzine (ATARAX/VISTARIL) 25 MG tablet Take 1 tablet (25 mg total) by mouth 3 (three) times daily as needed for anxiety. 05/10/17   Armandina Stammer I, NP  lisinopril-hydrochlorothiazide (ZESTORETIC) 20-12.5 MG tablet Take 1 tablet by mouth daily. 05/25/17   Sharlene Dory, DO  metFORMIN (GLUCOPHAGE) 1000 MG tablet Take 1 tablet (1,000 mg total) by mouth 2 (two) times daily with a meal. For diabetes 05/10/17   Armandina Stammer I, NP  metoprolol succinate (TOPROL-XL) 25 MG 24 hr tablet Take 1 tablet (25 mg total) by mouth daily. For high blood pressure 05/25/17   Wendling, [name], DO  prazosin (MINIPRESS) 1 MG capsule Take 1 capsule (1 mg total) by mouth at bedtime. For nightmares 05/10/17   Armandina Stammer I, NP  sertraline (ZOLOFT) 50 MG tablet Take 1 tablet (50 mg total) by mouth daily. For depression 05/25/17   Sharlene Dory, DO  traZODone (DESYREL) 150 MG tablet Take 1 tablet (150 mg total) by mouth at bedtime. 05/25/17   Sharlene Dory, DO    Allergies Patient has no known allergies.  Family History  Problem Relation Age of Onset  . Hypertension Mother   .  Diabetes Mother   . Colon cancer Father   . Hypertension Maternal Grandmother   . Hypertension Paternal Grandmother   . Diabetes Paternal Grandmother   . Breast cancer Cousin     Social History Social History   Tobacco Use  . Smoking status: Former Smoker    Types: Cigarettes  . Smokeless tobacco: Never Used  Substance Use Topics  . Alcohol use: No  . Drug use: No     Review of Systems  Constitutional: No fever/chills Eyes: No visual  changes. No discharge ENT: No upper respiratory complaints. Cardiovascular: no chest pain. Respiratory: no cough. No SOB. Gastrointestinal: No abdominal pain.  No nausea, no vomiting.  No diarrhea.  No constipation. Genitourinary: Patient has vaginal bleeding and pelvic cramping.  Negative for dysuria. No hematuria Musculoskeletal: Negative for musculoskeletal pain. Skin: Negative for rash, abrasions, lacerations, ecchymosis. Neurological: Negative for headaches, focal weakness or numbness.   ____________________________________________   PHYSICAL EXAM:  VITAL SIGNS: ED Triage Vitals  Enc Vitals Group     BP 05/31/18 1437 (!) 176/97     Pulse Rate 05/31/18 1437 95     Resp 05/31/18 1437 20     Temp 05/31/18 1437 98.7 F (37.1 C)     Temp Source 05/31/18 1437 Oral     SpO2 05/31/18 1437 99 %     Weight 05/31/18 1457 (!) 471 lb 5.5 oz (213.8 kg)     Height --      Head Circumference --      Peak Flow --      Pain Score --      Pain Loc --      Pain Edu? --      Excl. in GC? --      Constitutional: Alert and oriented. Well appearing and in no acute distress. Eyes: Conjunctivae are normal. PERRL. EOMI. Head: Atraumatic. Cardiovascular: Normal rate, regular rhythm. Normal S1 and S2.  Good peripheral circulation. Respiratory: Normal respiratory effort without tachypnea or retractions. Lungs CTAB. Good air entry to the bases with no decreased or absent breath sounds. Gastrointestinal: Bowel sounds 4 quadrants. Soft and nontender to palpation. No guarding or rigidity. No palpable masses. No distention. No CVA tenderness. Skin:  Skin is warm, dry and intact. No rash noted. Psychiatric: Mood and affect are normal. Speech and behavior are normal. Patient exhibits appropriate insight and judgement.   ____________________________________________   LABS (all labs ordered are listed, but only abnormal results are displayed)  Labs Reviewed  CBC WITH DIFFERENTIAL/PLATELET -  Abnormal; Notable for the following components:      Result Value   RBC 5.35 (*)    MCV 77.0 (*)    MCH 23.7 (*)    All other components within normal limits  COMPREHENSIVE METABOLIC PANEL - Abnormal; Notable for the following components:   Glucose, Bld 182 (*)    AST 13 (*)    All other components within normal limits  URINALYSIS, ROUTINE W REFLEX MICROSCOPIC - Abnormal; Notable for the following components:   Hgb urine dipstick LARGE (*)    Protein, ur 30 (*)    RBC / HPF >50 (*)    Bacteria, UA FEW (*)    All other components within normal limits  HCG, QUANTITATIVE, PREGNANCY  POC URINE PREG, ED  TYPE AND SCREEN  ABO/RH   ____________________________________________  EKG   ____________________________________________  RADIOLOGY I personally viewed and evaluated these images as part of my medical decision making, as well as reviewing the written  report by the radiologist.     [place] Pelvic Doppler (torsion R/o Or Mass Arterial Flow)  Result Date: 05/31/2018 CLINICAL DATA:  Pelvic pain EXAM: TRANSABDOMINAL AND TRANSVAGINAL ULTRASOUND OF PELVIS TECHNIQUE: Both transabdominal and transvaginal ultrasound examinations of the pelvis were performed. Transabdominal technique was performed for global imaging of the pelvis including uterus, ovaries, adnexal regions, and pelvic cul-de-sac. It was necessary to proceed with endovaginal exam following the transabdominal exam to visualize the uterus, endometrium, ovaries and adnexa. COMPARISON:  None FINDINGS: Uterus Measurements: 8.3 x 4.6 x 5.4 cm = volume: 107 mL. No fibroids or other mass visualized. Endometrium Thickness: 10 mm in thickness.  No focal abnormality visualized. Right ovary Measurements: 3.9 x 1.2 x 2.3 cm = volume: 5.5 mL. Normal appearance/no adnexal mass. Left ovary Measurements: 3.9 x 2.2 x 2.2 cm = volume: 9.5 mL. Normal appearance/no adnexal mass. Other findings Trace free fluid in the pelvis IMPRESSION: Unremarkable pelvic  ultrasound. Electronically Signed   By: Charlett NoseKevin  Dover M.D.   On: 05/31/2018 19:18   [place] Pelvic Complete With Transvaginal  Result Date: 05/31/2018 CLINICAL DATA:  Pelvic pain EXAM: TRANSABDOMINAL AND TRANSVAGINAL ULTRASOUND OF PELVIS TECHNIQUE: Both transabdominal and transvaginal ultrasound examinations of the pelvis were performed. Transabdominal technique was performed for global imaging of the pelvis including uterus, ovaries, adnexal regions, and pelvic cul-de-sac. It was necessary to proceed with endovaginal exam following the transabdominal exam to visualize the uterus, endometrium, ovaries and adnexa. COMPARISON:  None FINDINGS: Uterus Measurements: 8.3 x 4.6 x 5.4 cm = volume: 107 mL. No fibroids or other mass visualized. Endometrium Thickness: 10 mm in thickness.  No focal abnormality visualized. Right ovary Measurements: 3.9 x 1.2 x 2.3 cm = volume: 5.5 mL. Normal appearance/no adnexal mass. Left ovary Measurements: 3.9 x 2.2 x 2.2 cm = volume: 9.5 mL. Normal appearance/no adnexal mass. Other findings Trace free fluid in the pelvis IMPRESSION: Unremarkable pelvic ultrasound. Electronically Signed   By: Charlett NoseKevin  Dover M.D.   On: 05/31/2018 19:18    ____________________________________________    PROCEDURES  Procedure(s) performed:    Procedures    Medications - No data to display   ____________________________________________   INITIAL IMPRESSION / ASSESSMENT AND PLAN / ED COURSE  Pertinent labs & imaging results that were available during my care of the patient were reviewed by me and considered in my medical decision making (see chart for details).  Review of the [place] CSRS was performed in accordance of the [facility] prior to dispensing any controlled drugs.           Assessment and Plan: Vaginal Bleeding  Pelvic Cramping 28 year old female presents to the emergency department with vaginal bleeding and pelvic cramping for the past 4 days and concern for possible pregnancy.  On  physical exam, patient had no abdominal tenderness to palpation or guarding.  She seemed to be resting comfortably in exam room.  Original differential diagnosis included menses, ectopic pregnancy, first trimester vaginal bleeding, miscarriage, hemorrhagic cyst and ovarian torsion.  Urine pregnancy test was negative in the emergency department and beta-hCG was less than 1.  Hemoglobin and hematocrit were within reference range.  Pelvic ultrasound was unremarkable for acute abnormality.  No evidence of cystitis on urinalysis.  Patient was advised to follow-up with primary care as needed.  All patient questions were answered.  ____________________________________________  FINAL CLINICAL IMPRESSION(S) / ED DIAGNOSES  Final diagnoses:  Vaginal bleeding      NEW MEDICATIONS STARTED DURING THIS VISIT:  ED Discharge Orders  None          This chart was dictated using voice recognition software/Dragon. Despite best efforts to proofread, errors can occur which can change the meaning. Any change was purely unintentional.    Orvil Feil, PA-C 05/31/18 1954    Niel Hummer, MD 06/01/18 [phone]

## 2018-06-01 LAB — ABO/RH: ABO/RH(D): O POS

## 2018-06-08 ENCOUNTER — Encounter: Payer: Self-pay | Admitting: Family Medicine

## 2018-06-08 ENCOUNTER — Ambulatory Visit (INDEPENDENT_AMBULATORY_CARE_PROVIDER_SITE_OTHER): Payer: Self-pay | Admitting: Family Medicine

## 2018-06-08 ENCOUNTER — Other Ambulatory Visit: Payer: Self-pay

## 2018-06-08 DIAGNOSIS — I10 Essential (primary) hypertension: Secondary | ICD-10-CM

## 2018-06-08 DIAGNOSIS — F339 Major depressive disorder, recurrent, unspecified: Secondary | ICD-10-CM

## 2018-06-08 DIAGNOSIS — J45909 Unspecified asthma, uncomplicated: Secondary | ICD-10-CM | POA: Insufficient documentation

## 2018-06-08 DIAGNOSIS — F322 Major depressive disorder, single episode, severe without psychotic features: Secondary | ICD-10-CM

## 2018-06-08 DIAGNOSIS — K429 Umbilical hernia without obstruction or gangrene: Secondary | ICD-10-CM

## 2018-06-08 MED ORDER — METFORMIN HCL 1000 MG PO TABS: 1000.0000 mg | ORAL_TABLET | Freq: Two times a day (BID) | ORAL | 0 refills | Status: DC

## 2018-06-08 MED ORDER — SERTRALINE HCL 50 MG PO TABS: 50.0000 mg | ORAL_TABLET | Freq: Every day | ORAL | 0 refills | Status: DC

## 2018-06-08 MED ORDER — LISINOPRIL-HYDROCHLOROTHIAZIDE 20-12.5 MG PO TABS: 1.0000 | ORAL_TABLET | Freq: Every day | ORAL | 0 refills | Status: DC

## 2018-06-08 MED ORDER — METOPROLOL SUCCINATE ER 25 MG PO TB24: 25.0000 mg | ORAL_TABLET | Freq: Every day | ORAL | 0 refills | Status: DC

## 2018-06-08 NOTE — Progress Notes (Signed)
Chief Complaint  Patient presents with  . high risk for Covid.  needs work note    Subjective: Patient is a 28 y.o. female here for COVID-19 discussion. Due to COVID-19 pandemic, we are interacting via web portal for an electronic face-to-face visit. I verified patient's ID using 2 identifiers. Patient agreed to proceed with visit via this method. Patient is at home, I am at home. Patient and I are present for visit.   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 Hernia is painful when she lifts. Had seen [facility] before, told her to get wt loss surg prior to hernia repair. She has lost 60 lbs since then, getting more painful. No fevers or bowel changes. Gets short of breath lifting 2/2 asthma. Brings rescue inhaler, has more trouble lifting heavier objects at work.   Needs refills on all of her meds as she had lost her previous job and ins.   ROS: Heart: Denies chest pain  Lungs: +intermittent SOB   Past Medical History:  Diagnosis Date  . Asthma   . Depression   . Diabetes mellitus without complication (HCC)   . Hypertension   . Morbid obesity (HCC)   . Sleep apnea   . Thyroid disease     Objective: No conversational dyspnea Age appropriate judgment and insight Nml affect and mood  Assessment and Plan: MDD (major depressive disorder), severe (HCC)  Umbilical hernia without obstruction and without gangrene  Essential hypertension  Depression, recurrent (HCC)  Morbid obesity (HCC)  Uncomplicated asthma, unspecified asthma severity, unspecified whether persistent  Refill meds.  Letter requesting no lifting >20 lbs for now given both hernia and asthma. Rec she call her surgeon regarding new pain with her hernia.  F/u in 2 mo for CPE or prn.  The patient voiced understanding and agreement to the plan.  [name] [place], DO 06/08/18  1:48 PM

## 2018-06-09 ENCOUNTER — Encounter: Payer: Self-pay | Admitting: Family Medicine

## 2018-06-22 ENCOUNTER — Other Ambulatory Visit: Payer: Self-pay | Admitting: Family Medicine

## 2018-06-22 DIAGNOSIS — F322 Major depressive disorder, single episode, severe without psychotic features: Secondary | ICD-10-CM

## 2018-07-26 ENCOUNTER — Encounter: Payer: Self-pay | Admitting: Family Medicine

## 2018-07-26 ENCOUNTER — Other Ambulatory Visit: Payer: Self-pay

## 2018-07-26 ENCOUNTER — Ambulatory Visit (INDEPENDENT_AMBULATORY_CARE_PROVIDER_SITE_OTHER): Payer: BC Managed Care – PPO | Admitting: Family Medicine

## 2018-07-26 ENCOUNTER — Telehealth: Payer: Self-pay | Admitting: Family Medicine

## 2018-07-26 DIAGNOSIS — Z0289 Encounter for other administrative examinations: Secondary | ICD-10-CM | POA: Diagnosis not present

## 2018-07-26 DIAGNOSIS — J45909 Unspecified asthma, uncomplicated: Secondary | ICD-10-CM

## 2018-07-26 NOTE — Progress Notes (Signed)
CC: Letter for work  Subjective: Patient is a 28 y.o. female here to discuss returning to work. Due to COVID-19 pandemic, we are interacting via web portal for an electronic face-to-face visit. I verified patient's ID using 2 identifiers. Patient agreed to proceed with visit via this method. Patient is at home, I am at office. Patient and I are present for visit.   Pt has hx of asthma. Was hoping for accommodations at job, but they put her on leave. She was getting sob and pain (from hernia for which she follows w gen surg for) while lifting. Has had no breathing issues or pain over past 1.5 mo since getting furloughed. Unable to receive unemployment benefits. Needs to go back to work despite needing surg.   ROS: Lungs: Denies SOB   Past Medical History:  Diagnosis Date  . Asthma   . Depression   . Diabetes mellitus without complication ([place])   . Hypertension   . Morbid obesity ([place])   . Sleep apnea   . Thyroid disease     Objective: No conversational dyspnea Age appropriate judgment and insight Nml affect and mood  Assessment and Plan: Uncomplicated asthma, unspecified asthma severity, unspecified whether persistent - Plan: take 1-2 puffs of SABA prior to physical exertion at work.   Encounter for completion of form with patient - Plan: letter filled out stating she can return to work.  If she is having issues with hernia after going back to work, rec'd she contact her Psychologist, sport and exercise.  The patient voiced understanding and agreement to the plan.  [place], DO 07/26/18  4:15 PM

## 2018-07-26 NOTE — Telephone Encounter (Signed)
For what? She might need appt. I haven't seen her since May. Ty.

## 2018-07-26 NOTE — Telephone Encounter (Signed)
Pt is calling and needs a work note to return to work without any restrictions. Please put letter on mychart

## 2018-07-26 NOTE — Telephone Encounter (Signed)
Please advise 

## 2018-07-26 NOTE — Telephone Encounter (Signed)
Scheduled virtual visit this afternoon at 4.

## 2018-10-15 ENCOUNTER — Encounter: Payer: BC Managed Care – PPO | Admitting: Family Medicine

## 2018-12-31 DIAGNOSIS — F419 Anxiety disorder, unspecified: Secondary | ICD-10-CM | POA: Insufficient documentation

## 2019-01-17 ENCOUNTER — Other Ambulatory Visit: Payer: Self-pay

## 2019-01-18 ENCOUNTER — Ambulatory Visit (INDEPENDENT_AMBULATORY_CARE_PROVIDER_SITE_OTHER): Payer: BLUE CROSS/BLUE SHIELD | Admitting: Family Medicine

## 2019-01-18 ENCOUNTER — Other Ambulatory Visit: Payer: Self-pay

## 2019-01-18 ENCOUNTER — Encounter: Payer: Self-pay | Admitting: Family Medicine

## 2019-01-18 ENCOUNTER — Other Ambulatory Visit: Payer: Self-pay | Admitting: Family Medicine

## 2019-01-18 VITALS — BP 130/82 | HR 100 | Temp 96.3°F | Ht 65.0 in | Wt >= 6400 oz

## 2019-01-18 DIAGNOSIS — F322 Major depressive disorder, single episode, severe without psychotic features: Secondary | ICD-10-CM

## 2019-01-18 DIAGNOSIS — E1165 Type 2 diabetes mellitus with hyperglycemia: Secondary | ICD-10-CM

## 2019-01-18 DIAGNOSIS — I10 Essential (primary) hypertension: Secondary | ICD-10-CM | POA: Diagnosis not present

## 2019-01-18 DIAGNOSIS — Z23 Encounter for immunization: Secondary | ICD-10-CM

## 2019-01-18 LAB — MICROALBUMIN / CREATININE URINE RATIO
Creatinine,U: 121.1 mg/dL
Microalb Creat Ratio: 0.7 mg/g (ref 0.0–30.0)
Microalb, Ur: 0.8 mg/dL (ref 0.0–1.9)

## 2019-01-18 LAB — COMPREHENSIVE METABOLIC PANEL
ALT: 15 U/L (ref 0–35)
AST: 14 U/L (ref 0–37)
Albumin: 4 g/dL (ref 3.5–5.2)
Alkaline Phosphatase: 59 U/L (ref 39–117)
BUN: 15 mg/dL (ref 6–23)
CO2: 27 mEq/L (ref 19–32)
Calcium: 8.7 mg/dL (ref 8.4–10.5)
Chloride: 103 mEq/L (ref 96–112)
Creatinine, Ser: 0.9 mg/dL (ref 0.40–1.20)
GFR: 89.53 mL/min (ref >60.00)
Glucose, Bld: 242 mg/dL — ABNORMAL HIGH (ref 70–99)
Potassium: 4.1 mEq/L (ref 3.5–5.1)
Sodium: 135 mEq/L (ref 135–145)
Total Bilirubin: 0.5 mg/dL (ref 0.2–1.2)
Total Protein: 6.9 g/dL (ref 6.0–8.3)

## 2019-01-18 LAB — LIPID PANEL
Cholesterol: 163 mg/dL (ref 0–200)
HDL: 38.2 mg/dL — ABNORMAL LOW (ref >39.00)
LDL Cholesterol: 100 mg/dL — ABNORMAL HIGH (ref 0–99)
NonHDL: 124.77
Total CHOL/HDL Ratio: 4
Triglycerides: 123 mg/dL (ref 0.0–149.0)
VLDL: 24.6 mg/dL (ref 0.0–40.0)

## 2019-01-18 LAB — HEMOGLOBIN A1C: Hgb A1c MFr Bld: 10.3 % — ABNORMAL HIGH (ref 4.6–6.5)

## 2019-01-18 MED ORDER — METFORMIN HCL 1000 MG PO TABS: 1000.0000 mg | ORAL_TABLET | Freq: Two times a day (BID) | ORAL | 1 refills | Status: DC

## 2019-01-18 MED ORDER — SERTRALINE HCL 50 MG PO TABS: 50.0000 mg | ORAL_TABLET | Freq: Every day | ORAL | 2 refills | Status: DC

## 2019-01-18 MED ORDER — HYDROXYZINE HCL 25 MG PO TABS: 25.0000 mg | ORAL_TABLET | Freq: Three times a day (TID) | ORAL | 2 refills | Status: DC | PRN

## 2019-01-18 MED ORDER — LISINOPRIL-HYDROCHLOROTHIAZIDE 20-12.5 MG PO TABS: 1.0000 | ORAL_TABLET | Freq: Every day | ORAL | 2 refills | Status: DC

## 2019-01-18 MED ORDER — ONETOUCH VERIO VI STRP: ORAL_STRIP | 3 refills | Status: DC

## 2019-01-18 MED ORDER — ONETOUCH ULTRASOFT LANCETS MISC: 3 refills | Status: DC

## 2019-01-18 MED ORDER — METOPROLOL SUCCINATE ER 25 MG PO TB24: 25.0000 mg | ORAL_TABLET | Freq: Every day | ORAL | 2 refills | Status: DC

## 2019-01-18 MED ORDER — OZEMPIC (0.25 OR 0.5 MG/DOSE) 2 MG/1.5ML ~~LOC~~ SOPN: PEN_INJECTOR | SUBCUTANEOUS | 1 refills | Status: DC

## 2019-01-18 MED ORDER — SERTRALINE HCL 100 MG PO TABS: 100.0000 mg | ORAL_TABLET | Freq: Every day | ORAL | 3 refills | Status: DC

## 2019-01-18 NOTE — Patient Instructions (Addendum)
Call Center for [place] at [facility] at [phone] for an appointment.  They are located at [address], [place], [place], [address] (right across the hall from our office).  Call your eye doctor for an appointment.  Keep the diet clean and stay active.  Let me know if there are cost issues.   Let me know if you decide to get pregnant.    Let us know if you need anything.

## 2019-01-18 NOTE — Progress Notes (Signed)
Chief Complaint  Patient presents with  . Weight Loss    Subjective: Patient is a 28 y.o. female here for f/u.  DM Hx of DM, on Metformin. Has not been checking her sugars. Walking, going to gym. Diet is not good right now. Due for eye exam/urine protein testing.   Hypertension Patient presents for hypertension follow up. She does monitor home blood pressures. She is compliant with medications- Prinzide 20-25 mg/d, Toprol XL 25 mg/d. Patient has these side effects of medication: none Diet and exercise as above.   Depression/anxiety Taking Zoloft 50 mg/d. Feels she is still depressed. Not following w counseling, does not feel it is helpful. Exercise as above. No HI or SI.   ROS: Heart: Denies chest pain  Lungs: Denies SOB   Past Medical History:  Diagnosis Date  . Asthma   . Depression   . Diabetes mellitus without complication (HCC)   . Hypertension   . Morbid obesity (HCC)   . Sleep apnea     Objective: BP 130/82 (BP Location: Left Arm, Patient Position: Sitting, Cuff Size: Large)   Pulse 100   Temp (!) 96.3 F (35.7 C) (Temporal)   Ht 5' 5" (1.651 m)   Wt (!) 487 lb (220.9 kg)   SpO2 96%   BMI 81.04 kg/m  General: Awake, appears stated age HEENT: MMM, EOMi Heart: RRR, no murmurs Lungs: CTAB, no rales, wheezes or rhonchi. No accessory muscle use Psych: Age appropriate judgment and insight, normal affect and mood  Assessment and Plan: Type 2 diabetes mellitus with hyperglycemia, without long-term current use of insulin (HCC) - Plan: Comp Met (CMET), Lipid Profile, HgB A1c, Urine Microalbumin w/creat. ratio  Essential hypertension - Plan: metoprolol succinate (TOPROL-XL) 25 MG 24 hr tablet, lisinopril-hydrochlorothiazide (ZESTORETIC) 20-12.5 MG tablet  MDD (major depressive disorder), severe (HCC) - Plan: DISCONTINUED: sertraline (ZOLOFT) 50 MG tablet  Morbid obesity (HCC)  Need for tetanus booster - Plan: Tdap vaccine  1- Ck labs. Cont Metformin.  Counseled on diet and exercise. Start Ozempic for both A1c control and wt loss. She is contemplating bariatric surgery and thinks she might get it before summer. 2- Cont meds. She has no plans on getting pregnant, knows these medications can cause bad outcomes for developing fetus. 3- Increase Zoloft to 100 mg/d. Consider a different counselor.  4- Ozempic. I think bariatric surgery is an excellent option for her.  F/u pending results.  The patient voiced understanding and agreement to the plan.   [place] , DO 01/18/19  11:53 AM     

## 2019-01-20 ENCOUNTER — Encounter: Payer: Self-pay | Admitting: Family Medicine

## 2019-01-24 ENCOUNTER — Other Ambulatory Visit: Payer: Self-pay | Admitting: Family Medicine

## 2019-01-24 MED ORDER — ONETOUCH DELICA LANCETS 33G MISC: 3 refills | Status: DC

## 2019-02-23 ENCOUNTER — Encounter: Payer: BLUE CROSS/BLUE SHIELD | Admitting: Obstetrics & Gynecology

## 2019-02-23 DIAGNOSIS — Z01419 Encounter for gynecological examination (general) (routine) without abnormal findings: Secondary | ICD-10-CM

## 2019-02-28 ENCOUNTER — Ambulatory Visit: Payer: BLUE CROSS/BLUE SHIELD | Admitting: Dietician

## 2019-03-21 ENCOUNTER — Ambulatory Visit: Payer: BLUE CROSS/BLUE SHIELD | Admitting: Skilled Nursing Facility1

## 2019-04-06 ENCOUNTER — Ambulatory Visit (INDEPENDENT_AMBULATORY_CARE_PROVIDER_SITE_OTHER): Payer: BLUE CROSS/BLUE SHIELD | Admitting: Family Medicine

## 2019-04-06 ENCOUNTER — Encounter: Payer: Self-pay | Admitting: Family Medicine

## 2019-04-06 ENCOUNTER — Other Ambulatory Visit: Payer: Self-pay

## 2019-04-06 DIAGNOSIS — F339 Major depressive disorder, recurrent, unspecified: Secondary | ICD-10-CM | POA: Diagnosis not present

## 2019-04-06 MED ORDER — ESCITALOPRAM OXALATE 20 MG PO TABS: 20.0000 mg | ORAL_TABLET | Freq: Every day | ORAL | 2 refills | Status: DC

## 2019-04-06 NOTE — Progress Notes (Signed)
Chief Complaint  Patient presents with  . Follow-up    FMLA /due to work load being too much for her.. Missing weight loss appts. schedule is affecting her mental health.    Subjective: Patient is a 29 y.o. female here for stress. Due to COVID-19 pandemic, we are interacting via web portal for an electronic face-to-face visit. I verified patient's ID using 2 identifiers. Patient agreed to proceed with visit via this method. Patient is at home, I am at office. Patient and I are present for visit.    Patient has a history of suicidal ideation/attempt and psychiatric hospitalization.  She has been experiencing more stress and anxiety at work.  She works the night shift and due to fatigue has not been able to make her weight loss appointments.  If all goes well, she should have bariatric surgery in June of this year.  She is requesting FMLA for time to get to appointments and possibly to reduce hours so she does not lose benefits.  She is compliant with sertraline.  She would like to try something else though.  ROS: Heart: Denies chest pain  Lungs: Denies SOB   Past Medical History:  Diagnosis Date  . Asthma   . Depression   . Diabetes mellitus without complication (HCC)   . Hypertension   . Morbid obesity (HCC)   . Sleep apnea     Objective: No conversational dyspnea Age appropriate judgment and insight Nml affect and mood  Assessment and Plan: Depression, recurrent (HCC) - Plan: escitalopram (LEXAPRO) 20 MG tablet  Change sertraline to Lexapro.  I will fill out FMLA giving her 1 day a week to get to appointments.  I would not do short-term disability or allow her to reduce hours based off of this though.  She needs a follow-up with her psychiatrist if she is interested in that. I will see her in 4 weeks to recheck how she was doing on the new SSRI. The patient voiced understanding and agreement to the plan.  [name] [place], DO 04/07/19  8:15 AM

## 2019-04-07 ENCOUNTER — Encounter: Payer: Self-pay | Admitting: Family Medicine

## 2019-04-11 ENCOUNTER — Ambulatory Visit: Payer: BLUE CROSS/BLUE SHIELD | Admitting: Family Medicine

## 2019-04-13 ENCOUNTER — Encounter: Payer: BLUE CROSS/BLUE SHIELD | Attending: General Surgery | Admitting: Dietician

## 2019-04-13 ENCOUNTER — Other Ambulatory Visit: Payer: Self-pay

## 2019-04-13 ENCOUNTER — Telehealth: Payer: Self-pay | Admitting: Family Medicine

## 2019-04-13 ENCOUNTER — Encounter: Payer: Self-pay | Admitting: Dietician

## 2019-04-13 DIAGNOSIS — E669 Obesity, unspecified: Secondary | ICD-10-CM | POA: Diagnosis not present

## 2019-04-13 NOTE — Telephone Encounter (Signed)
Called informed of PCP instructions. She states she has not seen in 2 years.  Should she call for an appt/or ?

## 2019-04-13 NOTE — Progress Notes (Signed)
Nutrition Assessment for Bariatric Surgery Medical Nutrition Therapy  Appt Start Time: 8:15am     End Time: 9:20am  Patient was seen on 04/13/2019 for Pre-Operative Nutrition Assessment. Letter faxed to [facility] bariatric surgery program coordinator on 04/13/2019.   Referral stated Supervised Weight Loss (SWL) visits needed: 6* *May have already completed some of these with PCP  Planned surgery: Sleeve Gastrectomy  Pt expectation of surgery: to be healthier and lose weight  Pt expectation of dietitian: to provide guidance with choosing healthy foods   NUTRITION ASSESSMENT   Anthropometrics  Start weight at [facility]: 490 lbs (date: 04/13/2019) Height: 66 in BMI: 79 kg/m2     Lifestyle & Dietary Hx Work schedule is very inconsistent, works night shift on M/W/F each week. No typical day to day food pattern however will usually only eat 1-2 times throughout her day. May have boiled chicken with cauliflower rice and vegetables, or tilapia with rice and vegetables if cooks at home. Will eat out at [facility], [employer], or [employer] about ~5 x/week. Rarely eats dairy products, but will try to have yogurt. Bought portion plates to help with portion control. States she would like to focus on tracking her food intake in order to better manage this as well as her blood sugar. States she finds herself stress eating, primarily due to her busy work schedule and inconsistent sleep pattern.   24-Hr Dietary Recall First Meal: -  Snack: -  Second Meal: [employer]  Snack: - Third Meal: [employer]  Snack: - Beverages: water, [place] Tea (diet)  Estimated Energy Needs Calories: 1600-1800 Carbohydrate: 180-200g Protein: 100-113g Fat: 53-60g   NUTRITION DIAGNOSIS  Overweight/obesity ([place]-3.3) related to past poor dietary habits and physical inactivity as evidenced by patient w/ planned Sleeve Gastrectomy surgery following dietary guidelines for continued weight loss.    NUTRITION  INTERVENTION  Nutrition counseling (C-1) and education (E-2) to facilitate bariatric surgery goals.  Pre-Op Goals Reviewed with the Patient . Track food and beverage intake (pen and paper, MyFitness Pal, Baritastic app, etc.) . Make healthy food choices while monitoring portion sizes . Consume 3 meals per day or try to eat every 3-5 hours . Avoid concentrated sugars and fried foods . Keep sugar & fat in the single digits per serving on food labels . Practice CHEWING your food (aim for applesauce consistency) . Practice not drinking 15 minutes before, during, and 30 minutes after each meal and snack . Avoid all carbonated beverages (ex: soda, sparkling beverages)  . Limit caffeinated beverages (ex: coffee, tea, energy drinks) . Avoid all sugar-sweetened beverages (ex: regular soda, sports drinks)  . Avoid alcohol  . Aim for 64-100 ounces of FLUID daily (with at least half of fluid intake being plain water)  . Aim for at least 60-80 grams of PROTEIN daily . Look for a liquid protein source that contains ?15 g protein and ?5 g carbohydrate (ex: shakes, drinks, shots) . Make a list of non-food related activities . Physical activity is an important part of a healthy lifestyle so keep it moving! The goal is to reach 150 minutes of exercise per week, including cardiovascular and weight baring activity.  *Goals that are bolded indicate the pt would like to start working towards these  Handouts Provided Include  . Bariatric Surgery handouts (Nutrition Visits, Pre-Op Goals, Protein Shakes, Vitamins & Minerals)  Learning Style & Readiness for Change Teaching method utilized: Visual & Auditory  Demonstrated degree of understanding via: Teach Back  Barriers to  learning/adherence to lifestyle change: work schedule, stress/emotional eating   MONITORING & EVALUATION Dietary intake, weekly physical activity, body weight, and pre-op goals reached at next nutrition visit.   Next Steps Patient is to  return to [facility] in 1 month for SWL Visit or unless otherwise recommended per surgeon.

## 2019-04-13 NOTE — Telephone Encounter (Signed)
Called informed of PCP instructions. She agreed to do so. 

## 2019-04-13 NOTE — Telephone Encounter (Signed)
I think she should reach out to her psychiatrist for this. Ty.

## 2019-04-13 NOTE — Patient Instructions (Addendum)
Begin working through the [employer] discussed today, starting with the following:  . Track food and beverage intake (pen and paper, MyFitness Pal, [employer] app, etc.) . Keep sugar & fat in the single digits per serving on food labels

## 2019-04-13 NOTE — Telephone Encounter (Signed)
Patient states she is stressed out with work load and needs to be taken out of work for 2 weeks. Her job stated all she needed was a letter stating that her doctor is taking her out. I asked her if she had any FMLA papers and she stated she didn't have any they told her all she needed was a letter.  But she stated she would reach out to HR to see if they can send forms if needed. Please advise. Thanks

## 2019-04-13 NOTE — Telephone Encounter (Signed)
Yes. If she needs a referral, we can place.

## 2019-04-18 ENCOUNTER — Ambulatory Visit: Payer: BLUE CROSS/BLUE SHIELD | Admitting: Family Medicine

## 2019-04-29 ENCOUNTER — Other Ambulatory Visit: Payer: Self-pay | Admitting: Family Medicine

## 2019-04-29 DIAGNOSIS — F339 Major depressive disorder, recurrent, unspecified: Secondary | ICD-10-CM

## 2019-05-09 ENCOUNTER — Encounter: Payer: Self-pay | Admitting: Dietician

## 2019-05-09 ENCOUNTER — Encounter: Payer: BC Managed Care – PPO | Attending: General Surgery | Admitting: Dietician

## 2019-05-09 ENCOUNTER — Other Ambulatory Visit: Payer: Self-pay

## 2019-05-09 DIAGNOSIS — E669 Obesity, unspecified: Secondary | ICD-10-CM

## 2019-05-09 NOTE — Progress Notes (Signed)
Supervised Weight Loss Visit Bariatric Nutrition Education Appt Start Time: 3:35pm     End Time: 3:55pm  Planned Surgery: Sleeve  Pt Expectation of Surgery/ Goals: to be healthier and lose weight   1 out of 6 SWL Appointments    NUTRITION ASSESSMENT  Anthropometrics  Start weight at [facility]: 490 lbs (date: 04/13/2019) Today's weight: 488.7 lbs BMI: 78.9 kg/m2    Lifestyle & Dietary Hx Patient states she has taken LOA from work, been off for about 1 month now. States she has not been eating as much fast food and is cooking at home more. Switched to 1% milk and is considering using almond milk instead. Typical meal pattern is 2 meals and 1-2 snacks per day.   Estimated daily fluid intake: 48-64 oz Supplements: women's daily MVI, goli  Current average weekly physical activity: none  24-Hr Dietary Recall First Meal: Cap'n Crunch + 1% milk Snack: yogurt (or unsweetened applesauce)  Second Meal: - Snack: - Third Meal: baked chicken + sauteed greens/carrots/onions Snack: popcorn Beverages: water, diet tea  Estimated Energy Needs Calories: 1600-1800 Carbohydrate: 180-200g Protein: 100-113g Fat: 53-60g   NUTRITION DIAGNOSIS  Overweight/obesity ([place]-3.3) related to past poor dietary habits and physical inactivity as evidenced by patient w/ planned Sleeve Gastrectomy surgery following dietary guidelines for continued weight loss.   NUTRITION INTERVENTION  Nutrition counseling (C-1) and education (E-2) to facilitate bariatric surgery goals.  Pre-Op Goals Progress & New Goals . Aim to walk 30 minutes per week  . Focus on building balanced meals and snacks   Handouts Provided Include   Meal Ideas + My Plate Portions   Balanced Snacks   Learning Style & Readiness for Change Teaching method utilized: Visual & Auditory  Demonstrated degree of understanding via: Teach Back  Barriers to learning/adherence to lifestyle change: stress/ emotional eating   MONITORING &  EVALUATION Dietary intake, weekly physical activity, body weight, and pre-op goals in 1 month.   Next Steps  Patient is to return to [facility] in 1 month for 2nd SWL.

## 2019-05-09 NOTE — Patient Instructions (Signed)
Use the Meal Ideas and Balanced Snacks sheets for ideas on how to build balanced meals and snacks.   Aim to walk at least 30 minutes per week.

## 2019-06-06 ENCOUNTER — Encounter: Payer: Self-pay | Admitting: Dietician

## 2019-06-06 ENCOUNTER — Other Ambulatory Visit: Payer: Self-pay

## 2019-06-06 ENCOUNTER — Encounter: Payer: BC Managed Care – PPO | Attending: General Surgery | Admitting: Dietician

## 2019-06-06 DIAGNOSIS — E669 Obesity, unspecified: Secondary | ICD-10-CM | POA: Diagnosis not present

## 2019-06-06 NOTE — Patient Instructions (Signed)
Practice not drinking with meals/snacks. Avoid drinking 15 minutes before, during, and 30 minutes after meals/snacks.

## 2019-06-06 NOTE — Progress Notes (Signed)
Supervised Weight Loss Visit Bariatric Nutrition Education Appt Start Time: 3:20pm     End Time: 3:45pm  Planned Surgery: Sleeve  Pt Expectation of Surgery/ Goals: to be healthier and lose weight   2 out of 6 SWL Appointments    NUTRITION ASSESSMENT  Anthropometrics  Start weight at [facility]: 490 lbs (date: 04/13/2019) Today's weight: 484.4 lbs BMI: 78.2 kg/m2    Lifestyle & Dietary Hx Patient states she continues to cook and make most meals at home which she enjoys. States she has tried new foods and having balanced snacks using ideas from last appointment.Typical meal pattern is 2-3 meals and 1-2 snacks per day, sometimes skips breakfast. States she is working on lowering her A1c and we discussed various tips to help with this.    Estimated daily fluid intake: 48-64 oz Supplements: women's daily MVI, goli  Current average weekly physical activity: none  24-Hr Dietary Recall First Meal: Slim Fast (diabetes friendly)  Snack: - Second Meal: tuna Snack: almond butter + crackers  Third Meal: roasted chickpeas + beef stew + cauliflower rice  Snack: -  Beverages: water, zero calorie flavored water   Estimated Energy Needs Calories: 1600-1800 Carbohydrate: 180-200g Protein: 100-113g Fat: 53-60g   NUTRITION DIAGNOSIS  Overweight/obesity ([place]-3.3) related to past poor dietary habits and physical inactivity as evidenced by patient w/ planned Sleeve Gastrectomy surgery following dietary guidelines for continued weight loss.   NUTRITION INTERVENTION  Nutrition counseling (C-1) and education (E-2) to facilitate bariatric surgery goals.  Pre-Op Goals Progress & New Goals . Aim to walk 30 minutes per week  . Building balanced meals and snacks  . NEW: Avoid drinking with meals/snacks   Learning Style & Readiness for Change Teaching method utilized: Visual & Auditory  Demonstrated degree of understanding via: Teach Back  Barriers to learning/adherence to lifestyle change: stress/  emotional eating   MONITORING & EVALUATION Dietary intake, weekly physical activity, body weight, and pre-op goals in 1 month.   Next Steps  Patient is to return to [facility] in 1 month for 3rd SWL.

## 2019-06-13 ENCOUNTER — Other Ambulatory Visit: Payer: Self-pay | Admitting: Family Medicine

## 2019-06-22 ENCOUNTER — Encounter: Payer: Self-pay | Admitting: Family Medicine

## 2019-07-04 ENCOUNTER — Encounter: Payer: Self-pay | Admitting: Dietician

## 2019-07-04 ENCOUNTER — Encounter: Payer: BC Managed Care – PPO | Attending: General Surgery | Admitting: Dietician

## 2019-07-04 ENCOUNTER — Other Ambulatory Visit: Payer: Self-pay

## 2019-07-04 DIAGNOSIS — E669 Obesity, unspecified: Secondary | ICD-10-CM | POA: Diagnosis not present

## 2019-07-04 NOTE — Progress Notes (Signed)
Supervised Weight Loss Visit Bariatric Nutrition Education Appt Start Time: 3:00pm     End Time: 3:30pm  Planned Surgery: Sleeve  Pt Expectation of Surgery/ Goals: to be healthier and lose weight   3 out of 6 SWL Appointments    NUTRITION ASSESSMENT  Anthropometrics  Start weight at [facility]: 490 lbs (date: 04/13/2019) Today's weight: 486.9 lbs BMI: 78.6 kg/m2    Lifestyle & Dietary Hx Patient states she has started back at work on third shift. States she has struggled a lot over the past month with stress eating, but is finding it a little bit easier to get "back on track" since going back to work and having more structure. States that, on days she works, she typically does not eat much except maybe snack on some peanuts and maybe have a sandwich when she gets home. Was not able to get a good picture of dietary intake on days she is off of work, as she states she mainly just sleeps. States that her medications decrease her appetite and make her nauseous, which also prevent her from eating. Reports she has been getting migraines recently and attributes this to not eating, and also reports blood in her stool over the weekend. States she would like to lower her A1c, recent blood glucose readings reported are 180-210 with highest (about 1 month ago) around 400.   Estimated daily fluid intake: 48-64 oz Supplements: women's daily MVI, goli  Current average weekly physical activity: none  24-Hr Dietary Recall First Meal: - Snack: peanuts Second Meal: - Snack: - Third Meal: almond butter sandwich   Snack: -  Beverages: water   Estimated Energy Needs Calories: 1600-1800 Carbohydrate: 180-200g Protein: 100-113g Fat: 53-60g   NUTRITION DIAGNOSIS  Overweight/obesity ([place]-3.3) related to past poor dietary habits and physical inactivity as evidenced by patient w/ planned Sleeve Gastrectomy surgery following dietary guidelines for continued weight loss.   NUTRITION INTERVENTION  Nutrition  counseling (C-1) and education (E-2) to facilitate bariatric surgery goals.  Pre-Op Goals Progress & New Goals . Avoids drinking with meals/snacks  . NEW: Go no more than 4-5 hours without eating something  . NEW: Aim to get more physical activity throughout the week   Handouts Provided Include  Affordable Exercise Options in the [place]   Learning Style & Readiness for Change Teaching method utilized: Visual & Auditory  Demonstrated degree of understanding via: Teach Back  Barriers to learning/adherence to lifestyle change: stress/ emotional eating   MONITORING & EVALUATION Dietary intake, weekly physical activity, body weight, and pre-op goals in 1 month.   Next Steps  Patient is to return to [facility] in 1 month for 4th SWL.

## 2019-07-06 ENCOUNTER — Emergency Department (HOSPITAL_BASED_OUTPATIENT_CLINIC_OR_DEPARTMENT_OTHER): Payer: BC Managed Care – PPO

## 2019-07-06 ENCOUNTER — Emergency Department (HOSPITAL_BASED_OUTPATIENT_CLINIC_OR_DEPARTMENT_OTHER)
Admission: EM | Admit: 2019-07-06 | Discharge: 2019-07-06 | Disposition: A | Discharge status: Home / Self Care | Payer: BC Managed Care – PPO | Attending: Emergency Medicine | Admitting: Emergency Medicine

## 2019-07-06 ENCOUNTER — Encounter (HOSPITAL_BASED_OUTPATIENT_CLINIC_OR_DEPARTMENT_OTHER): Payer: Self-pay

## 2019-07-06 ENCOUNTER — Other Ambulatory Visit: Payer: Self-pay

## 2019-07-06 DIAGNOSIS — Z87891 Personal history of nicotine dependence: Secondary | ICD-10-CM | POA: Insufficient documentation

## 2019-07-06 DIAGNOSIS — I10 Essential (primary) hypertension: Secondary | ICD-10-CM | POA: Diagnosis not present

## 2019-07-06 DIAGNOSIS — E119 Type 2 diabetes mellitus without complications: Secondary | ICD-10-CM | POA: Diagnosis not present

## 2019-07-06 DIAGNOSIS — Z20822 Contact with and (suspected) exposure to covid-19: Secondary | ICD-10-CM | POA: Insufficient documentation

## 2019-07-06 DIAGNOSIS — J069 Acute upper respiratory infection, unspecified: Secondary | ICD-10-CM | POA: Diagnosis not present

## 2019-07-06 DIAGNOSIS — R05 Cough: Secondary | ICD-10-CM | POA: Diagnosis present

## 2019-07-06 DIAGNOSIS — Z79899 Other long term (current) drug therapy: Secondary | ICD-10-CM | POA: Diagnosis not present

## 2019-07-06 DIAGNOSIS — J45909 Unspecified asthma, uncomplicated: Secondary | ICD-10-CM | POA: Diagnosis not present

## 2019-07-06 DIAGNOSIS — Z7984 Long term (current) use of oral hypoglycemic drugs: Secondary | ICD-10-CM | POA: Insufficient documentation

## 2019-07-06 LAB — CBC WITH DIFFERENTIAL/PLATELET
Abs Immature Granulocytes: 0.01 10*3/uL (ref 0.00–0.07)
Basophils Absolute: 0 10*3/uL (ref 0.0–0.1)
Basophils Relative: 0 %
Eosinophils Absolute: 0.1 10*3/uL (ref 0.0–0.5)
Eosinophils Relative: 2 %
HCT: 44.4 % (ref 36.0–46.0)
Hemoglobin: 13.6 g/dL (ref 12.0–15.0)
Immature Granulocytes: 0 %
Lymphocytes Relative: 41 %
Lymphs Abs: 1.8 10*3/uL (ref 0.7–4.0)
MCH: 23.4 pg — ABNORMAL LOW (ref 26.0–34.0)
MCHC: 30.6 g/dL (ref 30.0–36.0)
MCV: 76.6 fL — ABNORMAL LOW (ref 80.0–100.0)
Monocytes Absolute: 0.6 10*3/uL (ref 0.1–1.0)
Monocytes Relative: 13 %
Neutro Abs: 2 10*3/uL (ref 1.7–7.7)
Neutrophils Relative %: 44 %
Platelets: 281 10*3/uL (ref 150–400)
RBC: 5.8 MIL/uL — ABNORMAL HIGH (ref 3.87–5.11)
RDW: 14.8 % (ref 11.5–15.5)
WBC: 4.5 10*3/uL (ref 4.0–10.5)
nRBC: 0 % (ref 0.0–0.2)

## 2019-07-06 LAB — D-DIMER, QUANTITATIVE: D-Dimer, Quant: 0.49 ug/mL-FEU (ref 0.00–0.50)

## 2019-07-06 LAB — COMPREHENSIVE METABOLIC PANEL
ALT: 26 U/L (ref 0–44)
AST: 26 U/L (ref 15–41)
Albumin: 4 g/dL (ref 3.5–5.0)
Alkaline Phosphatase: 60 U/L (ref 38–126)
Anion gap: 10 (ref 5–15)
BUN: 8 mg/dL (ref 6–20)
CO2: 24 mmol/L (ref 22–32)
Calcium: 8.7 mg/dL — ABNORMAL LOW (ref 8.9–10.3)
Chloride: 100 mmol/L (ref 98–111)
Creatinine, Ser: 0.82 mg/dL (ref 0.44–1.00)
GFR calc Af Amer: >60 mL/min (ref >60)
GFR calc non Af Amer: >60 mL/min (ref >60)
Glucose, Bld: 210 mg/dL — ABNORMAL HIGH (ref 70–99)
Potassium: 3.9 mmol/L (ref 3.5–5.1)
Sodium: 134 mmol/L — ABNORMAL LOW (ref 135–145)
Total Bilirubin: 0.9 mg/dL (ref 0.3–1.2)
Total Protein: 7.5 g/dL (ref 6.5–8.1)

## 2019-07-06 LAB — PREGNANCY, URINE: Preg Test, Ur: NEGATIVE

## 2019-07-06 LAB — SARS CORONAVIRUS 2 BY RT PCR (HOSPITAL ORDER, PERFORMED IN ~~LOC~~ HOSPITAL LAB): SARS Coronavirus 2: NEGATIVE

## 2019-07-06 LAB — TROPONIN I (HIGH SENSITIVITY)
Troponin I (High Sensitivity): 4 ng/L (ref <18)
Troponin I (High Sensitivity): 5 ng/L (ref <18)

## 2019-07-06 MED ORDER — ACETAMINOPHEN 500 MG PO TABS
1000.0000 mg | ORAL_TABLET | Freq: Once | ORAL | Status: AC
  Administered 2019-07-06: 1000 mg via ORAL
  Filled 2019-07-06: qty 2

## 2019-07-06 NOTE — ED Provider Notes (Addendum)
[facility] EMERGENCY DEPARTMENT Provider Note   CSN: [id number] Arrival date & time: 07/06/19  1451     History Chief Complaint  Patient presents with   Cough    Laura Benson is a 29 y.o. female.  HPI Patient is a 29 year old female with a history of morbid obesity, hypertension, diabetes mellitus, asthma, sleep apnea.  Patient states 5 days ago she began experiencing a dry cough.  She then began experiencing associated diffuse chest tightness and pain that is worse with coughing as well as fatigue and diffuse headache.  Yesterday she began "losing her voice".  She denies sore throat.  She denies any congestion, ear pain, visual changes, hemoptysis, shortness of breath, abdominal pain, nausea, vomiting, diarrhea, GU complaints, syncope.    Past Medical History:  Diagnosis Date   Asthma    Depression    Diabetes mellitus without complication (HCC)    Hypertension    Morbid obesity (HCC)    Sleep apnea     Patient Active Problem List   Diagnosis Date Noted   Type 2 diabetes mellitus with hyperglycemia, without long-term current use of insulin (HCC) 01/18/2019   Uncomplicated asthma 06/08/2018   Tachycardia    MDD (major depressive disorder), severe (HCC) 05/05/2017   Alcohol abuse 04/30/2017   Umbilical hernia without obstruction and without gangrene 02/25/2017   Obstructive sleep apnea 12/11/2016   Diabetes mellitus without complication (HCC) 01/23/2016   Hypertension 01/23/2016   Dysmenorrhea 12/25/2013   Rectal bleeding 10/20/2012   Depression, recurrent (HCC) 01/05/2009   BREAST PAIN, BILATERAL 01/05/2009   INTERTRIGO, CANDIDAL 01/05/2009   ABSENCE OF MENSTRUATION 09/20/2008   DELAYED MENSES 08/25/2008   ASCUS PAP 05/05/2008   Obesity 03/26/2006   HYPERTENSION, BENIGN SYSTEMIC 03/26/2006    Past Surgical History:  Procedure Laterality Date   ADENOIDECTOMY     TONSILLECTOMY       OB History    Gravida  0   Para        Term      Preterm      AB      Living        SAB      TAB      Ectopic      Multiple      Live Births              Family History  Problem Relation Age of Onset   Hypertension Mother    Diabetes Mother    Colon cancer Father    Hypertension Maternal Grandmother    Hypertension Paternal Grandmother    Diabetes Paternal Grandmother    Breast cancer Cousin     Social History   Tobacco Use   Smoking status: Former Smoker    Types: Cigarettes   Smokeless tobacco: Never Used  Substance Use Topics   Alcohol use: No   Drug use: No    Home Medications Prior to Admission medications   Medication Sig Start Date End Date Taking? Authorizing Provider  albuterol (PROVENTIL HFA;VENTOLIN HFA) 108 (90 Base) MCG/ACT inhaler Inhale 2 puffs into the lungs every 6 (six) hours as needed for wheezing or shortness of breath. 05/10/17   Armandina Stammer I, NP  escitalopram (LEXAPRO) 20 MG tablet TAKE 1 TABLET BY MOUTH EVERY DAY 05/02/19   Wendling, [name], DO  glucose blood [facility] VERIO) test strip Use as daily to check blood sugar. 01/18/19   Sharlene Dory, DO  hydrOXYzine (ATARAX/VISTARIL) 25 MG tablet Take 1 tablet (  25 mg total) by mouth 3 (three) times daily as needed for anxiety. 01/18/19   Sharlene Dory, DO  lisinopril-hydrochlorothiazide (ZESTORETIC) 20-12.5 MG tablet Take 1 tablet by mouth daily. 01/18/19   Sharlene Dory, DO  metFORMIN (GLUCOPHAGE) 1000 MG tablet Take 1 tablet (1,000 mg total) by mouth 2 (two) times daily with a meal. For diabetes 01/18/19   Sharlene Dory, DO  metoprolol succinate (TOPROL-XL) 25 MG 24 hr tablet Take 1 tablet (25 mg total) by mouth daily. For high blood pressure 01/18/19   Wendling, [name], DO  OneTouch Delica Lancets 33G MISC Use daily to check blood sugar. 01/24/19   Wendling, [name], DO  OZEMPIC, 0.25 OR 0.5 MG/DOSE, 2 MG/1.5ML SOPN INJECT 0.25 MG INTO THE SKIN ONCE A  WEEK FOR 7 DAYS, THEN 0.5 MG ONCE A WEEK FOR 28 DAYS. 06/14/19 07/19/19  Sharlene Dory, DO  prazosin (MINIPRESS) 1 MG capsule Take 1 capsule (1 mg total) by mouth at bedtime. For nightmares 05/10/17   Armandina Stammer I, NP  sertraline (ZOLOFT) 100 MG tablet Take 1 tablet (100 mg total) by mouth daily. 01/18/19   Sharlene Dory, DO    Allergies    Patient has no known allergies.  Review of Systems   Review of Systems  All other systems reviewed and are negative. Ten systems reviewed and are negative for acute change, except as noted in the HPI.    Physical Exam Updated Vital Signs BP (!) 165/116 (BP Location: Left Arm)    Pulse (!) 103    Temp 100.2 F (37.9 C) (Oral) Comment: RN Clydie Braun informe of Temp   Resp 20    Ht 5\' 6"  (1.676 m)    Wt (!) 220 kg    LMP 06/27/2019 (Approximate)    SpO2 95%    BMI 78.28 kg/m    Physical Exam Vitals and nursing note reviewed.  Constitutional:      General: She is not in acute distress.    Appearance: Normal appearance. She is obese. She is not ill-appearing, toxic-appearing or diaphoretic.  HENT:     Head: Normocephalic and atraumatic.     Right Ear: External ear normal.     Left Ear: External ear normal.     Nose: Nose normal.     Mouth/Throat:     Mouth: Mucous membranes are moist.     Pharynx: Oropharynx is clear. No oropharyngeal exudate or posterior oropharyngeal erythema.     Comments: No erythema noted in the posterior oropharynx.  Uvula is midline.  No tonsillar hypertrophy. Eyes:     General: No scleral icterus.       Right eye: No discharge.        Left eye: No discharge.     Extraocular Movements: Extraocular movements intact.     Conjunctiva/sclera: Conjunctivae normal.     Pupils: Pupils are equal, round, and reactive to light.  Cardiovascular:     Rate and Rhythm: Normal rate and regular rhythm.     Pulses: Normal pulses.     Heart sounds: Normal heart sounds. No murmur. No friction rub. No gallop.   Pulmonary:       Effort: Pulmonary effort is normal. No respiratory distress.     Breath sounds: Normal breath sounds. No stridor. No wheezing, rhonchi or rales.     Comments: Lungs clear to auscultation bilaterally, though difficult to assess secondary to body habitus. Abdominal:     Comments: Difficult to assess secondary to  body habitus.  Musculoskeletal:        General: Normal range of motion.     Cervical back: Normal range of motion and neck supple. No tenderness.  Skin:    General: Skin is warm and dry.  Neurological:     General: No focal deficit present.     Mental Status: She is alert and oriented to person, place, and time.  Psychiatric:        Mood and Affect: Mood normal.        Behavior: Behavior normal.    ED Results / Procedures / Treatments   Labs (all labs ordered are listed, but only abnormal results are displayed) Labs Reviewed  CBC WITH DIFFERENTIAL/PLATELET - Abnormal; Notable for the following components:      Result Value   RBC 5.80 (*)    MCV 76.6 (*)    MCH 23.4 (*)    All other components within normal limits  COMPREHENSIVE METABOLIC PANEL - Abnormal; Notable for the following components:   Sodium 134 (*)    Glucose, Bld 210 (*)    Calcium 8.7 (*)    All other components within normal limits  SARS CORONAVIRUS 2 BY RT PCR (HOSPITAL ORDER, [place] [facility])  PREGNANCY, URINE  D-DIMER, QUANTITATIVE (NOT AT [facility])  TROPONIN I (HIGH SENSITIVITY)  TROPONIN I (HIGH SENSITIVITY)   EKG EKG Interpretation  Date/Time:  Wednesday July 06 2019 15:06:32 EDT Ventricular Rate:  96 PR Interval:    QRS Duration: 90 QT Interval:  345 QTC Calculation: 436 R Axis:   64 Text Interpretation: Sinus rhythm Nonspecific T abnormalities, lateral leads No significant change since 05/08/2017 Confirmed by Veryl Speak [phone]) on 07/06/2019 3:25:20 PM  Radiology DG Chest Portable 1 View  Result Date: 07/06/2019 CLINICAL DATA:  Cough, headache, chest pain for 5  days EXAM: PORTABLE CHEST 1 VIEW COMPARISON:  12/04/2015 FINDINGS: 2 frontal views of the chest are obtained, limited by portable technique and body habitus. Cardiac silhouette is unremarkable. No airspace disease, effusion, or pneumothorax. No acute bony abnormality. IMPRESSION: 1. No acute intrathoracic process. Electronically Signed   By: Randa Ngo M.D.   On: 07/06/2019 16:11    Procedures Procedures   Medications Ordered in ED Medications  acetaminophen (TYLENOL) tablet 1,000 mg (1,000 mg Oral Given 07/06/19 1553)   ED Course  I have reviewed the triage vital signs and the nursing notes.  Pertinent labs & imaging results that were available during my care of the patient were reviewed by me and considered in my medical decision making (see chart for details).  Clinical Course as of Jul 05 1940  Wed Jul 06, 2019  1544 Patient is a 29 year old female with a history of morbid obesity that presents with dry cough, chest pain, fatigue, diffuse headache.  She has not had the COVID-19 vaccine.  Will obtain basic labs, chest x-ray, COVID-19 test.  Will give patient 1 g of APAP.  Will reassess.   [LJ]  1858 Labs resulted showing mild hyponatremia at 134, hyperglycemia to 10, hypocalcemia at 8.7.  CBC shows increase in RBCs of 5.8 with an MCV of 76.6.  Compared to prior lab values these seem baseline.  Her pregnancy test was negative.  COVID-19 test was negative.  Troponin was negative.  She initially had a low-grade fever at 100.2 Fahrenheit and was tachycardic at 103.  I gave patient a gram of Tylenol and this improved to 98.4 Fahrenheit with a heart rate of 73.  Blood pressure has additionally improved to 140/80. I discussed this patient with my attending physician Dr. Alvira Monday who will take over care of this patient moving forward.  I believe she will likely be discharged back home with strict return precautions.   [LJ]    Clinical Course User Index [LJ] Placido Sou, PA-C   MDM  Rules/Calculators/A&P                      Note: Portions of this report may have been transcribed using voice recognition software. Every effort was made to ensure accuracy; however, inadvertent computerized transcription errors may be present.    Final Clinical Impression(s) / ED Diagnoses Final diagnoses:  Viral URI with cough    Rx / DC Orders ED Discharge Orders    None       Placido Sou, PA-C 07/06/19 1903    Placido Sou, PA-C 07/06/19 1942    Alvira Monday, MD 07/08/19 1550

## 2019-07-06 NOTE — ED Triage Notes (Signed)
Pt c/o cough, HA, chest pain started 6/4-[facility]-steady gait

## 2019-07-11 ENCOUNTER — Encounter: Payer: Self-pay | Admitting: Family Medicine

## 2019-07-11 ENCOUNTER — Other Ambulatory Visit: Payer: Self-pay

## 2019-07-11 ENCOUNTER — Telehealth (INDEPENDENT_AMBULATORY_CARE_PROVIDER_SITE_OTHER): Payer: BC Managed Care – PPO | Admitting: Family Medicine

## 2019-07-11 DIAGNOSIS — K529 Noninfective gastroenteritis and colitis, unspecified: Secondary | ICD-10-CM | POA: Diagnosis not present

## 2019-07-11 DIAGNOSIS — J069 Acute upper respiratory infection, unspecified: Secondary | ICD-10-CM | POA: Diagnosis not present

## 2019-07-11 MED ORDER — BENZONATATE 100 MG PO CAPS: 100.0000 mg | ORAL_CAPSULE | Freq: Three times a day (TID) | ORAL | 0 refills | Status: DC | PRN

## 2019-07-11 MED ORDER — PREDNISONE 20 MG PO TABS: 40.0000 mg | ORAL_TABLET | Freq: Every day | ORAL | 0 refills | Status: AC

## 2019-07-11 MED ORDER — ONDANSETRON 4 MG PO TBDP: 4.0000 mg | ORAL_TABLET | Freq: Three times a day (TID) | ORAL | 0 refills | Status: DC | PRN

## 2019-07-11 NOTE — Progress Notes (Signed)
Chief Complaint  Patient presents with   Diarrhea   Cough   Headache   Generalized Body Aches   Shortness of Breath    Laura Benson here for URI complaints. Due to COVID-19 pandemic, we are interacting via web portal for an electronic face-to-face visit. I verified patient's ID using 2 identifiers. Patient agreed to proceed with visit via this method. Patient is in car, I am at office. Patient and I are present for visit.   Duration: 9 days  Associated symptoms: Fever (100.4 F), wheezing, myalgia and nausea, diarrhea Denies: sinus congestion, sinus pain, rhinorrhea, itchy watery eyes, ear pain, ear drainage, sore throat, shortness of breath and vomiting Treatment to date: OTC cough/cold med, Tylenol Sick contacts: Yes- ppl w covid s/s's at work who did test positive. She was in ER and tested neg, though with her s/s's and exposure, she was rec'd to quarantine.    Past Medical History:  Diagnosis Date   Asthma    Depression    Diabetes mellitus without complication (HCC)    Hypertension    Morbid obesity (HCC)    Sleep apnea     Exam No conversational dyspnea Age appropriate judgment and insight Nml affect and mood  Gastroenteritis - Plan: ondansetron (ZOFRAN-ODT) 4 MG disintegrating tablet  Viral upper respiratory tract infection - Plan: predniSONE (DELTASONE) 20 MG tablet, benzonatate (TESSALON) 100 MG capsule  Orders as above. Continue to push fluids, practice good hand hygiene, cover mouth when coughing. Cont quarantine through 6/22 as recommended by ED team. Letter released to reflect this.  F/u prn. If starting to experience fevers, shaking, or shortness of breath, seek immediate care. Pt voiced understanding and agreement to the plan.  [name], DO 07/11/19 11:42 AM

## 2019-07-27 ENCOUNTER — Other Ambulatory Visit: Payer: Self-pay

## 2019-07-27 ENCOUNTER — Encounter: Payer: Self-pay | Admitting: Family Medicine

## 2019-07-27 ENCOUNTER — Telehealth (INDEPENDENT_AMBULATORY_CARE_PROVIDER_SITE_OTHER): Payer: BC Managed Care – PPO | Admitting: Family Medicine

## 2019-07-27 VITALS — Temp 100.4°F

## 2019-07-27 DIAGNOSIS — J208 Acute bronchitis due to other specified organisms: Secondary | ICD-10-CM | POA: Diagnosis not present

## 2019-07-27 DIAGNOSIS — B9689 Other specified bacterial agents as the cause of diseases classified elsewhere: Secondary | ICD-10-CM

## 2019-07-27 MED ORDER — BENZONATATE 100 MG PO CAPS: 100.0000 mg | ORAL_CAPSULE | Freq: Three times a day (TID) | ORAL | 0 refills | Status: DC | PRN

## 2019-07-27 MED ORDER — PREDNISONE 20 MG PO TABS: 40.0000 mg | ORAL_TABLET | Freq: Every day | ORAL | 0 refills | Status: AC

## 2019-07-27 MED ORDER — DOXYCYCLINE HYCLATE 100 MG PO TABS: 100.0000 mg | ORAL_TABLET | Freq: Two times a day (BID) | ORAL | 0 refills | Status: AC

## 2019-07-27 NOTE — Progress Notes (Signed)
Chief Complaint  Patient presents with   Follow-up   Shortness of Breath    Laura Benson here for URI complaints. Due to COVID-19 pandemic, we are interacting via web portal for an electronic face-to-face visit. I verified patient's ID using 2 identifiers. Patient agreed to proceed with visit via this method. Patient is at home, I am at office. Patient and I are present for visit.   Duration: 4 weeks  Associated symptoms: Fever (100.4 F), sinus congestion, itchy watery eyes, shortness of breath, myalgia and cough; still having some watery diarrhea.  Denies: sinus pain, ear pain, ear drainage, sore throat and wheezing Treatment to date: Soup, pred burst (helped), Tessalon Perles, Coricedin  Sick contacts: Yes  Past Medical History:  Diagnosis Date   Asthma    Depression    Diabetes mellitus without complication (HCC)    Hypertension    Morbid obesity (HCC)    Sleep apnea     Temp (!) 100.4 F (38 C) (Temporal)    LMP 06/27/2019 (Approximate)  No conversational dyspnea Age appropriate judgment and insight Nml affect and mood  Acute bacterial bronchitis - Plan: doxycycline (VIBRA-TABS) 100 MG tablet, predniSONE (DELTASONE) 20 MG tablet, benzonatate (TESSALON) 100 MG capsule  Continue to push fluids, practice good hand hygiene, cover mouth when coughing. Reorder meds. Metamucil for diarrhea. Cont supportive care. Letter for work released on [employer].  F/u prn. Pt voiced understanding and agreement to the plan.  [name] [place], DO 07/27/19 3:40 PM

## 2019-08-02 ENCOUNTER — Ambulatory Visit: Payer: BC Managed Care – PPO | Admitting: Skilled Nursing Facility1

## 2019-08-04 ENCOUNTER — Encounter: Payer: BC Managed Care – PPO | Attending: General Surgery | Admitting: Skilled Nursing Facility1

## 2019-08-04 ENCOUNTER — Other Ambulatory Visit: Payer: Self-pay

## 2019-08-04 DIAGNOSIS — E669 Obesity, unspecified: Secondary | ICD-10-CM | POA: Insufficient documentation

## 2019-08-04 DIAGNOSIS — E119 Type 2 diabetes mellitus without complications: Secondary | ICD-10-CM

## 2019-08-04 NOTE — Progress Notes (Signed)
Supervised Weight Loss Visit Bariatric Nutrition Education Appt Start Time: 3:00pm     End Time: 3:30pm  Planned Surgery: Sleeve  Pt Expectation of Surgery/ Goals: to be healthier and lose weight   4th out of 6 SWL Appointments    NUTRITION ASSESSMENT  Anthropometrics  Start weight at [facility]: 490 lbs (date: 04/13/2019) Today's weight: 485 lbs BMI: 78.36 kg/m2    Lifestyle & Dietary Hx  Pt states after going back to work after medical leave she got sick and needing medication stating she is good now. Pt states she is hitting a breaking point and really wants to control her blood sugar. Pt states she really wants to meet with an endo for diabetes management. Pt states she has not been eating often throughout the day. Pt states she sleeps less than 5 hours nightly. Pt states she is under a lot of stress. Pt states she used to meet with a psychologist. Pt states her family is not supportive but her fiancee is. Pt states she lays in the bed all day long.  Pt states she stopped taking her Ozempic due to nausea.  A1C 10.3 Pt states she only checks her blood sugar once or twice a week.    Estimated daily fluid intake: 48-64 oz Supplements: women's daily MVI, goli  Current average weekly physical activity: none  24-Hr Dietary Recall First Meal: - Snack: peanuts Second Meal: - Snack: - Third Meal: almond butter sandwich   Snack: -  Beverages: water, unsweet tea, protein water  Estimated Energy Needs Calories: 1600-1800 Carbohydrate: 180-200g Protein: 100-113g Fat: 53-60g   NUTRITION DIAGNOSIS  Overweight/obesity ([place]-3.3) related to past poor dietary habits and physical inactivity as evidenced by patient w/ planned Sleeve Gastrectomy surgery following dietary guidelines for continued weight loss.   NUTRITION INTERVENTION  Nutrition counseling (C-1) and education (E-2) to facilitate bariatric surgery goals.  Pre-Op Goals Progress & New Goals . Avoids drinking with meals/snacks   . Avoids fast food . Continue: Go no more than 4-5 hours without eating something  . Continue: Aim to get more physical activity throughout the week  . use the serving size of plant based butter  . start eating within 1-1.5 hours of waking and then eating every 3-5 hours: try setting an alarm . work on finding a stress Relief mechanism AKA Hobbies Gurl! . find a youtube workout for beginners  . Aim to eat at least 1 meal at the table EVERY day . Aim to not drink with meals . Check your blood sugar DAILY: either before you eat anything or 2 hours after you have eaten a meal  . Call your doctor for a referral to en endocrinologist   Handouts Provided Include  Affordable Exercise Options in [place]   Learning Style & Readiness for Change Teaching method utilized: Visual & Auditory  Demonstrated degree of understanding via: Teach Back  Barriers to learning/adherence to lifestyle change: stress/ emotional eating   MONITORING & EVALUATION Dietary intake, weekly physical activity, body weight, and pre-op goals in 1 month.   Next Steps  Patient is to return to [facility] in 1 month for 6th SWL.

## 2019-08-17 DIAGNOSIS — M5416 Radiculopathy, lumbar region: Secondary | ICD-10-CM | POA: Insufficient documentation

## 2019-09-01 ENCOUNTER — Ambulatory Visit: Payer: BC Managed Care – PPO | Admitting: Skilled Nursing Facility1

## 2019-09-06 ENCOUNTER — Encounter: Payer: BC Managed Care – PPO | Attending: General Surgery | Admitting: Skilled Nursing Facility1

## 2019-09-06 ENCOUNTER — Other Ambulatory Visit: Payer: Self-pay

## 2019-09-06 DIAGNOSIS — E669 Obesity, unspecified: Secondary | ICD-10-CM | POA: Diagnosis not present

## 2019-09-06 NOTE — Progress Notes (Signed)
Supervised Weight Loss Visit Bariatric Nutrition Education Appt Start Time: 3:00pm     End Time: 3:30pm  Planned Surgery: Sleeve  Pt Expectation of Surgery/ Goals: to be healthier and lose weight   5th out of 6 SWL Appointments    NUTRITION ASSESSMENT  Anthropometrics  Start weight at [facility]: 490 lbs (date: 04/13/2019) Today's weight: 483 lbs BMI: 78.06 kg/m2    medcial Dx: PCOS, diabetes  Lifestyle & Dietary Hx   Pt states she stopped taking her Ozempic due to nausea.  A1C 10.3 Pt states she only checks her blood sugar once or twice a week.    Pt states she is now taking a steroid and is out of work due to nerve damage. Pt states she has been calling endocrinologist offices herself but has not gotten any calls back: Dietitian advised pt to speak with her doctor on this matter.  Pt states she has not been checking her blood sugars stating she has been staying in the bed sleeping. Pt states she has been drinking a lot of water lately. Pt states she is okay to have another SWL next month.  Pt states she has been cooking more lately trying to avoid eating out. Pt states the metformin has been giving her horrible diarrhea and the ozempic makes her nasueaous all the time. Pt states she eats peanuts or banana throughout the day. Pt states she has been working in her coloring book to deal with stress which has been working and haning with her fiancees children.  Pt states she wonders if she is currently pregnant: dietitian advised pt to get a pregnancy test from her doctor before she gets closer to having bariatric surgery.   Estimated daily fluid intake: 48-64 oz Supplements: women's daily MVI, goli  Current average weekly physical activity: none  24-Hr Dietary Recall First Meal: liver pudding + grits or [place] sausage  Snack: peanuts Second Meal: - Snack: - Third Meal: almond butter sandwich   Snack: -  Beverages: water, unsweet tea, protein water  Estimated Energy  Needs Calories: 1600-1800 Carbohydrate: 180-200g Protein: 100-113g Fat: 53-60g   NUTRITION DIAGNOSIS  Overweight/obesity ([place]-3.3) related to past poor dietary habits and physical inactivity as evidenced by patient w/ planned Sleeve Gastrectomy surgery following dietary guidelines for continued weight loss.   NUTRITION INTERVENTION  Nutrition counseling (C-1) and education (E-2) to facilitate bariatric surgery goals.  Pre-Op Goals Progress & New Goals . Avoids drinking with meals/snacks  . Avoids fast food . Continue: Go no more than 4-5 hours without eating something  . Continue: Aim to get more physical activity throughout the week  . use the serving size of plant based butter  . start eating within 1-1.5 hours of waking and then eating every 3-5 hours: try setting an alarm . work on finding a stress Relief mechanism AKA Hobbies Gurl! . find a youtube workout for beginners  . Aim to eat at least 1 meal at the table EVERY day . Aim to not drink with meals . Check your blood sugar DAILY: either before you eat anything or 2 hours after you have eaten a meal  . Call your doctor for a referral to en endocrinologist   Handouts Provided Include  Affordable Exercise Options in [place]   Learning Style & Readiness for Change Teaching method utilized: Visual & Auditory  Demonstrated degree of understanding via: Teach Back  Barriers to learning/adherence to lifestyle change: stress/ emotional eating   MONITORING & EVALUATION Dietary intake, weekly physical  activity, body weight, and pre-op goals in 1 month.   Next Steps  Patient is to return to [facility] in 1 month for 6th SWL.

## 2019-09-26 ENCOUNTER — Encounter: Payer: BC Managed Care – PPO | Admitting: Family Medicine

## 2019-09-27 ENCOUNTER — Encounter: Payer: BC Managed Care – PPO | Admitting: Family Medicine

## 2019-10-10 ENCOUNTER — Ambulatory Visit: Payer: BC Managed Care – PPO | Admitting: Skilled Nursing Facility1

## 2019-10-13 ENCOUNTER — Encounter: Payer: Self-pay | Admitting: Dietician

## 2019-10-13 ENCOUNTER — Encounter: Payer: BC Managed Care – PPO | Attending: General Surgery | Admitting: Dietician

## 2019-10-13 ENCOUNTER — Other Ambulatory Visit: Payer: Self-pay

## 2019-10-13 DIAGNOSIS — E669 Obesity, unspecified: Secondary | ICD-10-CM | POA: Diagnosis present

## 2019-10-13 NOTE — Progress Notes (Signed)
Supervised Weight Loss Visit Bariatric Nutrition Education  Planned Surgery: Sleeve  Pt Expectation of Surgery/ Goals: to be healthier and to lose weight   6 out of 6 SWL Appointments    NUTRITION ASSESSMENT  Anthropometrics  Start weight at [facility]: 490 lbs (date: 04/13/2019) Today's weight: 483 lbs BMI: 78.06 kg/m2    Lifestyle & Dietary Hx Patient states she has been working on incorporating more snacks throughout her day to better manage her blood sugar. States her blood sugars have been 200-220, lately, whereas before they were in the 400s. States she drinks a lot of water and is very focused on this. Does well with not drinking with meals. States she is currently out of work due to her back.   24-Hr Dietary Recall First Meal: 2 eggs + oatmeal  Snack: apple (or yogurt)  Second Meal: Protein water Snack: nuts (or berries) (or smoothie)  Third Meal: cabbage soup  Snack: snack  Beverages: water  Estimated Energy Needs Calories:1600-1800 Carbohydrate:180-200g Protein:100-113g Fat:53-60g   NUTRITION DIAGNOSIS  Overweight/obesity ([place]-3.3) related to past poor dietary habits and physical inactivity as evidenced by patient w/ planned Sleeve Gastrectomy surgery following dietary guidelines for continued weight loss.   NUTRITION INTERVENTION  Nutrition counseling (C-1) and education (E-2) to facilitate bariatric surgery goals.  Pre-Op Goals Progress & New Goals  Avoids drinking with meals/snacks   Avoids fast food  Eating at least every 4-5 hours    Continue: Aim to get more physical activity throughout the week   Stick to 1 serving size of plant based butter   Continue managing stress, great job with this!   Working on home workouts for movement    Checking blood sugar DAILY    Learning Style & Readiness for Change Teaching method utilized: Visual & Auditory  Demonstrated degree of understanding via: Teach Back  Barriers to learning/adherence to lifestyle  change: None Identified    MONITORING & EVALUATION Dietary intake, weekly physical activity, body weight, and pre-op goals at next nutrition visit.   Next Steps  Patient is to return to [facility] for Pre-Op Class prior to surgery.

## 2019-11-01 ENCOUNTER — Other Ambulatory Visit: Payer: Self-pay

## 2019-11-01 ENCOUNTER — Ambulatory Visit (INDEPENDENT_AMBULATORY_CARE_PROVIDER_SITE_OTHER): Payer: BC Managed Care – PPO | Admitting: Family Medicine

## 2019-11-01 ENCOUNTER — Encounter: Payer: Self-pay | Admitting: Family Medicine

## 2019-11-01 VITALS — BP 138/80 | HR 104 | Temp 98.8°F | Ht 66.0 in | Wt >= 6400 oz

## 2019-11-01 DIAGNOSIS — E1165 Type 2 diabetes mellitus with hyperglycemia: Secondary | ICD-10-CM

## 2019-11-01 DIAGNOSIS — Z23 Encounter for immunization: Secondary | ICD-10-CM | POA: Diagnosis not present

## 2019-11-01 MED ORDER — METFORMIN HCL ER 500 MG PO TB24: 1000.0000 mg | ORAL_TABLET | Freq: Two times a day (BID) | ORAL | 5 refills | Status: DC

## 2019-11-01 NOTE — Progress Notes (Signed)
Subjective:   Chief Complaint  Patient presents with  . Follow-up    Laura Benson is a 29 y.o. female here for follow-up of diabetes.   Sanjuanita's self monitored glucose range is in the 300's.  Patient denies hypoglycemic reactions. Patient does not require insulin.   Medications include: metformin 1000 mg bid, stopped Ozempic; still having GI upset from the metformin Diet is good.  Exercise: some walking  Past Medical History:  Diagnosis Date  . Asthma   . Depression   . Diabetes mellitus without complication (HCC)   . Hypertension   . Morbid obesity (HCC)   . Sleep apnea      Related testing: Retinal exam: Scheduled on 10/12 Pneumovax: done  Objective:  BP 138/80 (BP Location: Left Arm, Patient Position: Sitting, Cuff Size: Large)   Pulse (!) 104   Temp 98.8 F (37.1 C) (Oral)   Ht 5\' 6"  (1.676 m)   Wt (!) 477 lb 4 oz (216.5 kg)   SpO2 97%   BMI 77.03 kg/m  General:  Well developed, well nourished, in no apparent distress Skin:  Warm, no pallor or diaphoresis Head:  Normocephalic, atraumatic Eyes:  Pupils equal and round, sclera anicteric without injection  Lungs:  CTAB, no access msc use Cardio:  RRR, no bruits, no LE edema Neuro:  Sensation intact to pinprick on feet Psych: Age appropriate judgment and insight  Assessment:   Type 2 diabetes mellitus with hyperglycemia, without long-term current use of insulin (HCC) - Plan: Hemoglobin A1c, Comprehensive metabolic panel, Lipid panel  Morbid obesity (HCC)   Plan:   Status: Uncontrolled. Counseled on diet and exercise. Change Metformin to XR 1000 mg bid. Ck labs today. Eye exam next week. Send sugar readings in 2 weeks. Will likely need to add SU to drop sugars initially, she did not tolerate Ozempic very well.  She needs to get set up with her GYN. She is following w bariatric surg team. She will have surg scheduled if her A1c is controlled, which I am concerned about given her home readings.  F/u  pending A1c/labs. The patient voiced understanding and agreement to the plan.  [place], DO 11/01/19 10:21 AM

## 2019-11-01 NOTE — Patient Instructions (Addendum)
Give [place] 2-3 business days to get the results of your labs back.   Send me readings of your sugars in the next 2 weeks. I might add a new medication.   Call Center for [facility] at [facility] at [phone] for an appointment.  They are located at [address], [address], [place], [place], [address] (right across the hall from our office).  Let me know if you change your mind about the flu shot and covid vaccine.   Let us know if you need anything.

## 2019-11-02 ENCOUNTER — Other Ambulatory Visit: Payer: Self-pay | Admitting: Family Medicine

## 2019-11-02 LAB — COMPREHENSIVE METABOLIC PANEL
AG Ratio: 1.3 (calc) (ref 1.0–2.5)
ALT: 19 U/L (ref 6–29)
AST: 19 U/L (ref 10–30)
Albumin: 4.1 g/dL (ref 3.6–5.1)
Alkaline phosphatase (APISO): 74 U/L (ref 31–125)
BUN: 9 mg/dL (ref 7–25)
CO2: 24 mmol/L (ref 20–32)
Calcium: 9.3 mg/dL (ref 8.6–10.2)
Chloride: 97 mmol/L — ABNORMAL LOW (ref 98–110)
Creat: 0.71 mg/dL (ref 0.50–1.10)
Globulin: 3.2 g/dL (calc) (ref 1.9–3.7)
Glucose, Bld: 321 mg/dL — ABNORMAL HIGH (ref 65–99)
Potassium: 4.1 mmol/L (ref 3.5–5.3)
Sodium: 135 mmol/L (ref 135–146)
Total Bilirubin: 0.5 mg/dL (ref 0.2–1.2)
Total Protein: 7.3 g/dL (ref 6.1–8.1)

## 2019-11-02 LAB — HEMOGLOBIN A1C
Hgb A1c MFr Bld: 12.2 % of total Hgb — ABNORMAL HIGH (ref <5.7)
Mean Plasma Glucose: 303 (calc)
eAG (mmol/L): 16.8 (calc)

## 2019-11-02 LAB — LIPID PANEL
Cholesterol: 201 mg/dL — ABNORMAL HIGH (ref <200)
HDL: 40 mg/dL — ABNORMAL LOW (ref >=50)
LDL Cholesterol (Calc): 134 mg/dL (calc) — ABNORMAL HIGH
Non-HDL Cholesterol (Calc): 161 mg/dL (calc) — ABNORMAL HIGH (ref <130)
Total CHOL/HDL Ratio: 5 (calc) — ABNORMAL HIGH (ref <5.0)
Triglycerides: 147 mg/dL (ref <150)

## 2019-11-02 MED ORDER — GLYBURIDE 5 MG PO TABS: 5.0000 mg | ORAL_TABLET | Freq: Every day | ORAL | 2 refills | Status: DC

## 2019-11-22 ENCOUNTER — Other Ambulatory Visit: Payer: Self-pay | Admitting: Family Medicine

## 2019-11-22 MED ORDER — GLYBURIDE 5 MG PO TABS: 5.0000 mg | ORAL_TABLET | Freq: Two times a day (BID) | ORAL | 2 refills | Status: DC

## 2019-11-24 ENCOUNTER — Other Ambulatory Visit: Payer: Self-pay | Admitting: Family Medicine

## 2019-11-28 ENCOUNTER — Ambulatory Visit (INDEPENDENT_AMBULATORY_CARE_PROVIDER_SITE_OTHER): Payer: BC Managed Care – PPO | Admitting: Psychology

## 2019-11-28 DIAGNOSIS — F509 Eating disorder, unspecified: Secondary | ICD-10-CM

## 2019-12-05 ENCOUNTER — Ambulatory Visit: Payer: BC Managed Care – PPO | Admitting: Family Medicine

## 2019-12-07 ENCOUNTER — Ambulatory Visit: Payer: BC Managed Care – PPO | Admitting: Family Medicine

## 2019-12-07 ENCOUNTER — Encounter: Payer: Self-pay | Admitting: Family Medicine

## 2019-12-07 DIAGNOSIS — Z0289 Encounter for other administrative examinations: Secondary | ICD-10-CM

## 2019-12-12 ENCOUNTER — Other Ambulatory Visit: Payer: Self-pay | Admitting: General Surgery

## 2019-12-12 ENCOUNTER — Ambulatory Visit: Payer: BC Managed Care – PPO | Admitting: Psychology

## 2019-12-19 ENCOUNTER — Other Ambulatory Visit: Payer: BC Managed Care – PPO

## 2019-12-27 ENCOUNTER — Other Ambulatory Visit: Payer: Self-pay | Admitting: General Surgery

## 2019-12-27 ENCOUNTER — Ambulatory Visit
Admission: RE | Admit: 2019-12-27 | Discharge: 2019-12-27 | Disposition: A | Discharge status: Home / Self Care | Payer: BC Managed Care – PPO | Source: Ambulatory Visit | Attending: General Surgery | Admitting: General Surgery

## 2020-01-03 ENCOUNTER — Other Ambulatory Visit: Payer: Self-pay

## 2020-01-03 ENCOUNTER — Encounter: Payer: Self-pay | Admitting: Family Medicine

## 2020-01-03 ENCOUNTER — Ambulatory Visit (INDEPENDENT_AMBULATORY_CARE_PROVIDER_SITE_OTHER): Payer: BC Managed Care – PPO | Admitting: Family Medicine

## 2020-01-03 VITALS — BP 160/110 | HR 101 | Temp 98.6°F | Ht 66.0 in | Wt >= 6400 oz

## 2020-01-03 DIAGNOSIS — Z0001 Encounter for general adult medical examination with abnormal findings: Secondary | ICD-10-CM | POA: Diagnosis not present

## 2020-01-03 DIAGNOSIS — E1165 Type 2 diabetes mellitus with hyperglycemia: Secondary | ICD-10-CM | POA: Diagnosis not present

## 2020-01-03 DIAGNOSIS — K529 Noninfective gastroenteritis and colitis, unspecified: Secondary | ICD-10-CM | POA: Diagnosis not present

## 2020-01-03 MED ORDER — TOUJEO MAX SOLOSTAR 300 UNIT/ML ~~LOC~~ SOPN: 12.0000 [IU] | PEN_INJECTOR | Freq: Every day | SUBCUTANEOUS | 1 refills | Status: DC

## 2020-01-03 MED ORDER — ONDANSETRON 4 MG PO TBDP: 4.0000 mg | ORAL_TABLET | Freq: Three times a day (TID) | ORAL | 0 refills | Status: DC | PRN

## 2020-01-03 NOTE — Patient Instructions (Addendum)
Check your sugars at home and send me readings in 2 weeks.  Keep the diet clean and stay active.  Pepcid (famotidine) is over the counter, take twice daily for reflux/nausea.   Let us know if you need anything.

## 2020-01-03 NOTE — Progress Notes (Signed)
Chief Complaint  Patient presents with  . Annual Exam     Well Woman Laura Benson is here for a complete physical.   Her last physical was >1 year ago.  Current diet: in general, a "healthy" diet. Current exercise: some walking. Contraception? Yes Fatigue out of ordinary? No Seatbelt? Yes  Health Maintenance Pap/HPV- Getting one this Fri Tetanus- Yes HIV screening- Yes Eye exam in Jan.   Hypertension Patient presents for hypertension follow up. She does monitor home blood pressures. Blood pressures ranging on average from 120's/70's. She is compliant with medication- Prinzide 20-12.5 mg/d. Patient has these side effects of medication: none Diet and exercise as above.  Diabetes Last A1c was 12.2 around 2 months ago.  She was started on glyburide and sugars have gone down to the high 100/low 200s.  She was supposed to get bariatric surgery with Dr. Andrey Campanile, but this was delayed due to her uncontrolled A1c.  Her eye exam is scheduled in January.  Past Medical History:  Diagnosis Date  . Asthma   . Depression   . Diabetes mellitus without complication (HCC)   . Hypertension   . Morbid obesity (HCC)   . Sleep apnea      Past Surgical History:  Procedure Laterality Date  . ADENOIDECTOMY    . TONSILLECTOMY      Medications  Current Outpatient Medications on File Prior to Visit  Medication Sig Dispense Refill  . albuterol (PROVENTIL HFA;VENTOLIN HFA) 108 (90 Base) MCG/ACT inhaler Inhale 2 puffs into the lungs every 6 (six) hours as needed for wheezing or shortness of breath. 1 Inhaler 2  . escitalopram (LEXAPRO) 20 MG tablet TAKE 1 TABLET BY MOUTH EVERY DAY 90 tablet 1  . glucose blood (ONETOUCH VERIO) test strip Use as daily to check blood sugar. 100 each 3  . glyBURIDE (DIABETA) 5 MG tablet Take 1 tablet (5 mg total) by mouth 2 (two) times daily with a meal. 60 tablet 2  . hydrOXYzine (ATARAX/VISTARIL) 25 MG tablet TAKE 1 TABLET (25 MG TOTAL) BY MOUTH 3 (THREE)  TIMES DAILY AS NEEDED FOR ANXIETY. 90 tablet 2  . lisinopril-hydrochlorothiazide (ZESTORETIC) 20-12.5 MG tablet Take 1 tablet by mouth daily. 90 tablet 2  . meloxicam (MOBIC) 15 MG tablet Take 1 tablet by mouth daily.    . metFORMIN (GLUCOPHAGE XR) 500 MG 24 hr tablet Take 2 tablets (1,000 mg total) by mouth in the morning and at bedtime. 120 tablet 5  . metoprolol succinate (TOPROL-XL) 25 MG 24 hr tablet Take 1 tablet (25 mg total) by mouth daily. For high blood pressure 90 tablet 2  . ondansetron (ZOFRAN-ODT) 4 MG disintegrating tablet Take 1 tablet (4 mg total) by mouth every 8 (eight) hours as needed for nausea or vomiting. 20 tablet 0  . OneTouch Delica Lancets 33G MISC Use daily to check blood sugar. 100 each 3   Allergies No Known Allergies  Review of Systems: Constitutional:  no unexpected weight changes Eye:  no recent significant change in vision Ear/Nose/Mouth/Throat:  Ears:  no tinnitus or vertigo and no recent change in hearing Nose/Mouth/Throat:  no complaints of nasal congestion, no sore throat Cardiovascular: no chest pain Respiratory:  no cough and no shortness of breath Gastrointestinal:  no abdominal pain, no change in bowel habits GU:  Female: negative for dysuria or pelvic pain Musculoskeletal/Extremities:  no pain of the joints Integumentary (Skin/Breast):  no abnormal skin lesions reported Neurologic:  no headaches Endocrine:  denies fatigue Hematologic/Lymphatic:  No areas  of easy bleeding  Exam BP (!) 160/110 (BP Location: Left Wrist, Patient Position: Sitting, Cuff Size: Large)   Pulse (!) 101   Temp 98.6 F (37 C) (Oral)   Ht 5\' 6"  (1.676 m)   Wt (!) 480 lb 8 oz (218 kg)   SpO2 94%   BMI 77.55 kg/m  General:  well developed, well nourished, in no apparent distress Skin:  no significant moles, warts, or growths Head:  no masses, lesions, or tenderness Eyes:  pupils equal and round, sclera anicteric without injection Ears:  canals without lesions, TMs  shiny without retraction, no obvious effusion, no erythema Nose:  nares patent, septum midline, mucosa normal, and no drainage or sinus tenderness Throat/Pharynx:  lips and gingiva without lesion; tongue and uvula midline; non-inflamed pharynx; no exudates or postnasal drainage Neck: neck supple without adenopathy, thyromegaly, or masses Lungs:  clear to auscultation, breath sounds equal bilaterally, no respiratory distress Cardio:  regular rate and rhythm, no bruits, no LE edema Abdomen:  abdomen soft, nontender; bowel sounds normal; no masses or organomegaly Genital: Defer to GYN Musculoskeletal:  symmetrical muscle groups noted without atrophy or deformity Extremities:  no clubbing, cyanosis, or edema, no deformities, no skin discoloration Neuro:  gait normal; deep tendon reflexes normal and symmetric Psych: well oriented with normal range of affect and appropriate judgment/insight  Assessment and Plan  Encounter for well adult exam with abnormal findings  Type 2 diabetes mellitus with hyperglycemia, without long-term current use of insulin (HCC) - Plan: insulin glargine, 2 Unit Dial, (TOUJEO MAX SOLOSTAR) 300 UNIT/ML Solostar Pen  Gastroenteritis - Plan: ondansetron (ZOFRAN-ODT) 4 MG disintegrating tablet   Well 29 y.o. female. Other orders as above. The patient will monitor her blood pressure at home and if it starts to increase, she will let 26 know. I will add 12 units nightly of Toujeo, continue glyburide 5 mg twice daily and Metformin XR 1000 mg twice daily.  Counseled on diet and exercise.  Due to her surgical situation, will add insulin and I hope this will be a temporary measure. GYN appt this Friday. Eye exam next mo. Follow up in 1 mo, will check A1c and hep C screen.  She will send me blood sugar readings in 2 weeks. The patient voiced understanding and agreement to the plan.  [place], DO 01/03/20 12:06 PM

## 2020-01-04 ENCOUNTER — Other Ambulatory Visit: Payer: Self-pay | Admitting: Family Medicine

## 2020-01-04 MED ORDER — INSULIN PEN NEEDLE 32G X 4 MM MISC: 1 refills | Status: DC

## 2020-01-06 ENCOUNTER — Ambulatory Visit: Payer: BC Managed Care – PPO | Admitting: Family Medicine

## 2020-01-09 ENCOUNTER — Ambulatory Visit (INDEPENDENT_AMBULATORY_CARE_PROVIDER_SITE_OTHER): Payer: BC Managed Care – PPO | Admitting: Family Medicine

## 2020-01-09 ENCOUNTER — Other Ambulatory Visit (HOSPITAL_COMMUNITY)
Admission: RE | Admit: 2020-01-09 | Discharge: 2020-01-09 | Disposition: A | Discharge status: Home / Self Care | Payer: BC Managed Care – PPO | Source: Ambulatory Visit | Attending: Family Medicine | Admitting: Family Medicine

## 2020-01-09 ENCOUNTER — Other Ambulatory Visit: Payer: Self-pay

## 2020-01-09 ENCOUNTER — Encounter: Payer: Self-pay | Admitting: Family Medicine

## 2020-01-09 VITALS — BP 140/90 | HR 104 | Temp 99.0°F | Resp 20 | Ht 66.0 in | Wt >= 6400 oz

## 2020-01-09 DIAGNOSIS — Z202 Contact with and (suspected) exposure to infections with a predominantly sexual mode of transmission: Secondary | ICD-10-CM

## 2020-01-09 MED ORDER — DOXYCYCLINE HYCLATE 100 MG PO TABS: 100.0000 mg | ORAL_TABLET | Freq: Two times a day (BID) | ORAL | 0 refills | Status: DC

## 2020-01-09 NOTE — Patient Instructions (Signed)
Laura Benson, Female Laura Benson is an STD (sexually transmitted disease). It is a bacterial infection that spreads (is contagious) through sexual contact. Laura Benson can occur in different areas of the body, including:  The tube that moves urine from the bladder out of the body (urethra).  The lower part of the uterus (cervix).  The throat.  The rectum. This condition is not difficult to treat. However, if left untreated, Laura Benson can lead to more serious health problems, including pelvic inflammatory disorder (PID). PID can increase your risk of not being able to have children (sterility). Also, if Laura Benson is left untreated and you are pregnant or become pregnant, there is a chance that your baby can become infected during delivery. This may cause serious health problems for the baby. What are the causes? Laura Benson is caused by the bacteria Laura Benson trachomatis. It is passed from an infected partner during sexual activity. Laura Benson can spread through contact with the genitals, mouth, or rectum. What are the signs or symptoms? In some cases, there may not be any symptoms for this condition (asymptomatic), especially early in the infection. If symptoms develop, they may include:  Burning with urination.  Frequent urination.  Vaginal discharge.  Redness, soreness, and swelling (inflammation) of the rectum.  Bleeding or discharge from the rectum.  Abdominal pain.  Pain during sexual intercourse.  Bleeding between menstrual periods.  Itching, burning, or redness in the eyes, or discharge from the eyes. How is this diagnosed? This condition may be diagnosed with:  Urine tests.  Swab tests. Depending on your symptoms, your health care provider may use a cotton swab to collect discharge from your vagina or rectum to test for the bacteria.  A pelvic exam. How is this treated? This condition is treated with antibiotic medicines. If you are pregnant, certain types of antibiotics will  need to be avoided. Follow these instructions at home: Medicines  Take over-the-counter and prescription medicines only as told by your health care provider.  Take your antibiotic medicine as told by your health care provider. Do not stop taking the antibiotic even if you start to feel better. Sexual activity  Tell sexual partners about your infection. This includes any oral, anal, or vaginal sex partners you have had within 60 days of when your symptoms started. Sexual partners should also be treated, even if they have no signs of the disease.  Do not have sex until you and your sexual partners have completed treatment and your health care provider says it is okay. If your health care provider prescribed you a single dose treatment, wait 7 days after taking the treatment before having sex. General instructions  It is your responsibility to get your test results. Ask your health care provider, or the department performing the test, when your results will be ready.  Get plenty of rest.  Eat a healthy, well-balanced diet.  Drink enough fluids to keep your urine clear or pale yellow.  Keep all follow-up visits as told by your health care provider. This is important. You may need to be tested for infection again 3 months after treatment. How is this prevented? The only sure way to prevent Laura Benson is to avoid having sex. However, you can lower your risk by:  Using latex condoms correctly every time you have sex.  Not having multiple sexual partners.  Asking if your sexual partner has been tested for STIs and had negative results. Contact a health care provider if:  You develop new symptoms or your symptoms do not get   better after completing treatment.  You have a fever or chills.  You have pain during sexual intercourse. Get help right away if:  Your pain gets worse and does not get better with medicine.  You develop flu-like symptoms, such as night sweats, sore throat, or  muscle aches.  You experience nausea or vomiting.  You have difficulty swallowing.  You have bleeding between periods or after sex.  You have irregular menstrual periods.  You have abdominal or lower back pain that does not get better with medicine.  You feel weak or dizzy, or you faint.  You are pregnant and you develop symptoms of Laura Benson. Summary  Laura Benson is an STD (sexually transmitted disease). It is a bacterial infection that spreads (is contagious) through sexual contact.  This condition is not difficult to treat, however. If left untreated, Laura Benson can lead to more serious health problems, including pelvic inflammatory disease (PID).  In some cases, there may not be any symptoms for this condition (asymptomatic).  This condition is treated with antibiotic medicines.  Using latex condoms correctly every time you have sex can help prevent Laura Benson. This information is not intended to replace advice given to you by your health care provider. Make sure you discuss any questions you have with your health care provider. Document Revised: 07/07/2017 Document Reviewed: 12/31/2015 Elsevier Patient Education  2020 [employer]  

## 2020-01-09 NOTE — Progress Notes (Signed)
Patient ID: Laura Benson, female    DOB: [birth year]  Age: 29 y.o. MRN: [id number]    Subjective:  Subjective  HPI Laura Benson presents for her ex boyfriend tested positive for chlamydia and herpes ----  When we reviewed the labs--- it was actually  HSV I and chlamydia  Pt denies any vaginal symptoms / d/c etc  Review of Systems  Constitutional: Negative for appetite change, diaphoresis, fatigue and unexpected weight change.  Eyes: Negative for pain, redness and visual disturbance.  Respiratory: Negative for cough, chest tightness, shortness of breath and wheezing.   Cardiovascular: Negative for chest pain, palpitations and leg swelling.  Endocrine: Negative for cold intolerance, heat intolerance, polydipsia, polyphagia and polyuria.  Genitourinary: Negative for difficulty urinating, dysuria and frequency.  Neurological: Negative for dizziness, light-headedness, numbness and headaches.    History Past Medical History:  Diagnosis Date  . Asthma   . Depression   . Diabetes mellitus without complication (HCC)   . Hypertension   . Morbid obesity (HCC)   . Sleep apnea     She has a past surgical history that includes Tonsillectomy and Adenoidectomy.   Her family history includes Breast cancer in her cousin; Colon cancer in her father; Diabetes in her mother and paternal grandmother; Hypertension in her maternal grandmother, mother, and paternal grandmother.She reports that she has quit smoking. Her smoking use included cigarettes. She has never used smokeless tobacco. She reports that she does not drink alcohol and does not use drugs.  Current Outpatient Medications on File Prior to Visit  Medication Sig Dispense Refill  . albuterol (PROVENTIL HFA;VENTOLIN HFA) 108 (90 Base) MCG/ACT inhaler Inhale 2 puffs into the lungs every 6 (six) hours as needed for wheezing or shortness of breath. 1 Inhaler 2  . escitalopram (LEXAPRO) 20 MG tablet TAKE 1 TABLET BY MOUTH EVERY DAY 90  tablet 1  . glucose blood (ONETOUCH VERIO) test strip Use as daily to check blood sugar. 100 each 3  . glyBURIDE (DIABETA) 5 MG tablet Take 1 tablet (5 mg total) by mouth 2 (two) times daily with a meal. 60 tablet 2  . hydrOXYzine (ATARAX/VISTARIL) 25 MG tablet TAKE 1 TABLET (25 MG TOTAL) BY MOUTH 3 (THREE) TIMES DAILY AS NEEDED FOR ANXIETY. 90 tablet 2  . insulin glargine, 2 Unit Dial, (TOUJEO MAX SOLOSTAR) 300 UNIT/ML Solostar Pen Inject 12 Units into the skin at bedtime. 15 mL 1  . Insulin Pen Needle 32G X 4 MM MISC To use with toujeo 50 each 1  . lisinopril-hydrochlorothiazide (ZESTORETIC) 20-12.5 MG tablet Take 1 tablet by mouth daily. 90 tablet 2  . meloxicam (MOBIC) 15 MG tablet Take 1 tablet by mouth daily.    . metFORMIN (GLUCOPHAGE XR) 500 MG 24 hr tablet Take 2 tablets (1,000 mg total) by mouth in the morning and at bedtime. 120 tablet 5  . metoprolol succinate (TOPROL-XL) 25 MG 24 hr tablet Take 1 tablet (25 mg total) by mouth daily. For high blood pressure 90 tablet 2  . ondansetron (ZOFRAN-ODT) 4 MG disintegrating tablet Take 1 tablet (4 mg total) by mouth every 8 (eight) hours as needed for nausea or vomiting. 20 tablet 0  . OneTouch Delica Lancets 33G MISC Use daily to check blood sugar. 100 each 3   No current facility-administered medications on file prior to visit.     Objective:  Objective  Physical Exam Vitals and nursing note reviewed.  Constitutional:      Appearance: She is well-developed and well-nourished.  HENT:     Head: Normocephalic and atraumatic.  Eyes:     Extraocular Movements: EOM normal.     Conjunctiva/sclera: Conjunctivae normal.  Neck:     Thyroid: No thyromegaly.     Vascular: No carotid bruit or JVD.  Cardiovascular:     Rate and Rhythm: Normal rate and regular rhythm.     Heart sounds: Normal heart sounds. No murmur heard.   Pulmonary:     Effort: Pulmonary effort is normal. No respiratory distress.     Breath sounds: Normal breath  sounds. No wheezing or rales.  Chest:     Chest wall: No tenderness.  Musculoskeletal:        General: No edema.     Cervical back: Normal range of motion and neck supple.  Neurological:     Mental Status: She is alert and oriented to person, place, and time.  Psychiatric:        Mood and Affect: Mood and affect normal.    BP 140/90 (BP Location: Right Arm, Patient Position: Sitting, Cuff Size: Large)   Pulse (!) 104   Temp 99 F (37.2 C) (Oral)   Resp 20   Ht 5\' 6"  (1.676 m)   Wt (!) 483 lb 6.4 oz (219.3 kg)   SpO2 99%   BMI 78.02 kg/m  Wt Readings from Last 3 Encounters:  01/09/20 (!) 483 lb 6.4 oz (219.3 kg)  01/03/20 (!) 480 lb 8 oz (218 kg)  11/01/19 (!) 477 lb 4 oz (216.5 kg)     Lab Results  Component Value Date   WBC 4.5 07/06/2019   HGB 13.6 07/06/2019   HCT 44.4 07/06/2019   PLT 281 07/06/2019   GLUCOSE 321 (H) 11/01/2019   CHOL 201 (H) 11/01/2019   TRIG 147 11/01/2019   HDL 40 (L) 11/01/2019   LDLCALC 134 (H) 11/01/2019   ALT 19 11/01/2019   AST 19 11/01/2019   NA 135 11/01/2019   K 4.1 11/01/2019   CL 97 (L) 11/01/2019   CREATININE 0.71 11/01/2019   BUN 9 11/01/2019   CO2 24 11/01/2019   TSH 1.224 05/04/2017   INR 1.0 10/11/2008   HGBA1C 12.2 (H) 11/01/2019   MICROALBUR 0.8 01/18/2019    DG UGI W SINGLE CM (SOL OR THIN BA)  Result Date: 12/27/2019 CLINICAL DATA:  Morbid obesity. EXAM: UPPER GI SERIES WITH KUB TECHNIQUE: After obtaining a scout radiograph a routine upper GI series was performed using thin barium FLUOROSCOPY TIME:  Fluoroscopy Time:  1 minutes 24 seconds Radiation Exposure Index (if provided by the fluoroscopic device): 0 Number of Acquired Spot Images: 7 COMPARISON:  Preliminary KUB FINDINGS: Negative Image quality limited due to morbid obesity. Esophageal mucosa and motility normal. No stricture or mass. No hiatal hernia identified. Stomach normal in contour. No mass or edema. Normal emptying of the stomach. Proximal small bowel  normal. Duodenal bulb normal. IMPRESSION: Negative upper GI. Electronically Signed   By: 12/29/2019 M.D.   On: 12/27/2019 09:27     Assessment & Plan:  Plan  I am having 12/29/2019 start on doxycycline. I am also having her maintain her albuterol, metoprolol succinate, lisinopril-hydrochlorothiazide, OneTouch Verio, OneTouch Delica Lancets 33G, escitalopram, metFORMIN, glyBURIDE, hydrOXYzine, meloxicam, Toujeo Max SoloStar, ondansetron, and Insulin Pen Needle.  Meds ordered this encounter  Medications  . doxycycline (VIBRA-TABS) 100 MG tablet    Sig: Take 1 tablet (100 mg total) by mouth 2 (two) times daily.    Dispense:  14  tablet    Refill:  0    Problem List Items Addressed This Visit   None   Visit Diagnoses    STD exposure    -  Primary   Relevant Orders   Cervicovaginal ancillary only( [facility])   HIV Antibody (routine testing w rflx)   RPR   HSV Type I/II IgG, IgMw/ reflex   Exposure to chlamydia       Relevant Medications   doxycycline (VIBRA-TABS) 100 MG tablet    discussed with pt difference between hsvi and hsv II Will treat for chlamydia and self swab done Also discussed condom use  rto prn   Follow-up: Return if symptoms worsen or fail to improve.  Donato Schultz, DO

## 2020-01-10 LAB — RPR: RPR Ser Ql: NONREACTIVE

## 2020-01-10 LAB — HIV ANTIBODY (ROUTINE TESTING W REFLEX): HIV 1&2 Ab, 4th Generation: NONREACTIVE

## 2020-01-11 LAB — CERVICOVAGINAL ANCILLARY ONLY
Bacterial Vaginitis (gardnerella): POSITIVE — AB
Candida Glabrata: NEGATIVE
Candida Vaginitis: NEGATIVE
Chlamydia: NEGATIVE
Comment: NEGATIVE
Comment: NEGATIVE
Comment: NEGATIVE
Comment: NEGATIVE
Comment: NEGATIVE
Comment: NORMAL
Neisseria Gonorrhea: NEGATIVE
Trichomonas: NEGATIVE

## 2020-01-12 LAB — SPECIMEN STATUS REPORT

## 2020-01-23 ENCOUNTER — Other Ambulatory Visit: Payer: Self-pay | Admitting: Family Medicine

## 2020-01-23 DIAGNOSIS — E1165 Type 2 diabetes mellitus with hyperglycemia: Secondary | ICD-10-CM

## 2020-01-23 MED ORDER — TOUJEO MAX SOLOSTAR 300 UNIT/ML ~~LOC~~ SOPN: 16.0000 [IU] | PEN_INJECTOR | Freq: Every day | SUBCUTANEOUS | 1 refills | Status: DC

## 2020-01-27 ENCOUNTER — Other Ambulatory Visit: Payer: Self-pay | Admitting: Family Medicine

## 2020-01-27 DIAGNOSIS — I10 Essential (primary) hypertension: Secondary | ICD-10-CM

## 2020-01-29 ENCOUNTER — Other Ambulatory Visit: Payer: Self-pay | Admitting: Family Medicine

## 2020-02-01 ENCOUNTER — Other Ambulatory Visit: Payer: Self-pay

## 2020-02-02 ENCOUNTER — Other Ambulatory Visit: Payer: Self-pay

## 2020-02-02 ENCOUNTER — Other Ambulatory Visit (HOSPITAL_COMMUNITY)
Admission: RE | Admit: 2020-02-02 | Discharge: 2020-02-02 | Disposition: A | Discharge status: Home / Self Care | Payer: BC Managed Care – PPO | Source: Ambulatory Visit | Attending: Family Medicine | Admitting: Family Medicine

## 2020-02-02 ENCOUNTER — Ambulatory Visit (INDEPENDENT_AMBULATORY_CARE_PROVIDER_SITE_OTHER): Payer: BLUE CROSS/BLUE SHIELD | Admitting: Family Medicine

## 2020-02-02 VITALS — BP 132/82 | HR 93 | Temp 98.8°F | Ht 66.0 in | Wt >= 6400 oz

## 2020-02-02 DIAGNOSIS — N912 Amenorrhea, unspecified: Secondary | ICD-10-CM

## 2020-02-02 DIAGNOSIS — Z01419 Encounter for gynecological examination (general) (routine) without abnormal findings: Secondary | ICD-10-CM | POA: Diagnosis not present

## 2020-02-02 LAB — POCT URINE PREGNANCY: Preg Test, Ur: NEGATIVE

## 2020-02-02 NOTE — Patient Instructions (Signed)

## 2020-02-02 NOTE — Progress Notes (Unsigned)
Patient ID: Laura Benson, female    DOB: [birth year]  Age: 30 y.o. MRN: [id number]    Subjective:  Subjective  HPI Laura Benson presents for pap only.  She has an appointment tomorrow for cpe and labs   Pt only complaint is she is late for her period and would like a pregnancy test   Review of Systems  Constitutional: Negative for appetite change, diaphoresis, fatigue and unexpected weight change.  Eyes: Negative for pain, redness and visual disturbance.  Respiratory: Negative for cough, chest tightness, shortness of breath and wheezing.   Cardiovascular: Negative for chest pain, palpitations and leg swelling.  Endocrine: Negative for cold intolerance, heat intolerance, polydipsia, polyphagia and polyuria.  Genitourinary: Positive for menstrual problem. Negative for difficulty urinating, dysuria and frequency.  Neurological: Negative for dizziness, light-headedness, numbness and headaches.    History Past Medical History:  Diagnosis Date  . Asthma   . Depression   . Diabetes mellitus without complication (HCC)   . Hypertension   . Morbid obesity (HCC)   . Sleep apnea     She has a past surgical history that includes Tonsillectomy and Adenoidectomy.   Her family history includes Breast cancer in her cousin; Colon cancer in her father; Diabetes in her mother and paternal grandmother; Hypertension in her maternal grandmother, mother, and paternal grandmother.She reports that she has quit smoking. Her smoking use included cigarettes. She has never used smokeless tobacco. She reports that she does not drink alcohol and does not use drugs.  Current Outpatient Medications on File Prior to Visit  Medication Sig Dispense Refill  . albuterol (PROVENTIL HFA;VENTOLIN HFA) 108 (90 Base) MCG/ACT inhaler Inhale 2 puffs into the lungs every 6 (six) hours as needed for wheezing or shortness of breath. 1 Inhaler 2  . escitalopram (LEXAPRO) 20 MG tablet TAKE 1 TABLET BY MOUTH EVERY DAY 90  tablet 1  . glyBURIDE (DIABETA) 5 MG tablet Take 1 tablet (5 mg total) by mouth 2 (two) times daily with a meal. 60 tablet 2  . hydrOXYzine (ATARAX/VISTARIL) 25 MG tablet TAKE 1 TABLET (25 MG TOTAL) BY MOUTH 3 (THREE) TIMES DAILY AS NEEDED FOR ANXIETY. 90 tablet 2  . insulin glargine, 2 Unit Dial, (TOUJEO MAX SOLOSTAR) 300 UNIT/ML Solostar Pen Inject 16 Units into the skin at bedtime. 15 mL 1  . Insulin Pen Needle 32G X 4 MM MISC To use with toujeo 50 each 1  . Lancets (ONETOUCH DELICA PLUS LANCET33G) MISC USE DAILY TO CHECK BLOOD SUGAR 100 each 12  . lisinopril-hydrochlorothiazide (ZESTORETIC) 20-12.5 MG tablet Take 1 tablet by mouth daily. 90 tablet 2  . meloxicam (MOBIC) 15 MG tablet Take 1 tablet by mouth daily.    . metFORMIN (GLUCOPHAGE XR) 500 MG 24 hr tablet Take 2 tablets (1,000 mg total) by mouth in the morning and at bedtime. 120 tablet 5  . metoprolol succinate (TOPROL-XL) 25 MG 24 hr tablet TAKE 1 TABLET (25 MG TOTAL) BY MOUTH DAILY. FOR HIGH BLOOD PRESSURE 30 tablet 8  . ondansetron (ZOFRAN-ODT) 4 MG disintegrating tablet Take 1 tablet (4 mg total) by mouth every 8 (eight) hours as needed for nausea or vomiting. 20 tablet 0  . ONETOUCH VERIO test strip USE AS DAILY TO CHECK BLOOD SUGAR. 100 strip 12   No current facility-administered medications on file prior to visit.     Objective:  Objective  Physical Exam Vitals and nursing note reviewed. Exam conducted with a chaperone present.  Constitutional:  Appearance: She is well-developed and well-nourished. She is obese.  HENT:     Head: Normocephalic and atraumatic.  Eyes:     Extraocular Movements: EOM normal.     Conjunctiva/sclera: Conjunctivae normal.  Neck:     Thyroid: No thyromegaly.     Vascular: No JVD.  Genitourinary:    General: Normal vulva.     Exam position: Lithotomy position.     Pubic Area: No rash or pubic lice.      Labia:        Right: No rash, tenderness, lesion or injury.        Left: No rash,  tenderness, lesion or injury.      Urethra: No urethral pain, urethral swelling or urethral lesion.     Vagina: No signs of injury. Vaginal discharge present. No erythema, tenderness, bleeding, lesions or prolapsed vaginal walls.     Cervix: Discharge present. No cervical motion tenderness, friability, lesion, erythema, cervical bleeding or eversion.     Uterus: Normal. Not deviated, not enlarged, not fixed, not tender and no uterine prolapse.      Adnexa: Right adnexa normal and left adnexa normal.  Musculoskeletal:        General: No edema.  Neurological:     Mental Status: She is alert and oriented to person, place, and time.  Psychiatric:        Mood and Affect: Mood and affect normal.    BP 132/82 (BP Location: Right Arm, Patient Position: Sitting, Cuff Size: Large)   Pulse 93   Temp 98.8 F (37.1 C) (Oral)   Ht 5\' 6"  (1.676 m)   Wt (!) 486 lb (220.4 kg)   SpO2 98%   BMI 78.44 kg/m  Wt Readings from Last 3 Encounters:  02/02/20 (!) 486 lb (220.4 kg)  01/09/20 (!) 483 lb 6.4 oz (219.3 kg)  01/03/20 (!) 480 lb 8 oz (218 kg)     Lab Results  Component Value Date   WBC 4.5 07/06/2019   HGB 13.6 07/06/2019   HCT 44.4 07/06/2019   PLT 281 07/06/2019   GLUCOSE 321 (H) 11/01/2019   CHOL 201 (H) 11/01/2019   TRIG 147 11/01/2019   HDL 40 (L) 11/01/2019   LDLCALC 134 (H) 11/01/2019   ALT 19 11/01/2019   AST 19 11/01/2019   NA 135 11/01/2019   K 4.1 11/01/2019   CL 97 (L) 11/01/2019   CREATININE 0.71 11/01/2019   BUN 9 11/01/2019   CO2 24 11/01/2019   TSH 1.224 05/04/2017   INR 1.0 10/11/2008   HGBA1C 12.2 (H) 11/01/2019   MICROALBUR 0.8 01/18/2019    No results found.   Assessment & Plan:  Plan  I have discontinued 01/20/2019 [facility]'s doxycycline. I am also having her maintain her albuterol, lisinopril-hydrochlorothiazide, escitalopram, metFORMIN, glyBURIDE, hydrOXYzine, meloxicam, ondansetron, Insulin Pen Needle, Toujeo Max SoloStar, metoprolol succinate, OneTouch  Verio, and OneTouch Delica Plus Lancet33G.  No orders of the defined types were placed in this encounter.   Problem List Items Addressed This Visit      Unprioritized   Amenorrhea    preg test is neg Pt will discuss further with pcp tomorrow       Relevant Orders   POCT urine pregnancy (Completed)   Encounter for gynecological examination without abnormal finding - Primary    Pap done        Relevant Orders   Cytology - PAP( [facility])      Follow-up: Return if symptoms worsen or fail  to improve.  Ann Held, DO

## 2020-02-03 ENCOUNTER — Ambulatory Visit (INDEPENDENT_AMBULATORY_CARE_PROVIDER_SITE_OTHER): Payer: BLUE CROSS/BLUE SHIELD | Admitting: Family Medicine

## 2020-02-03 ENCOUNTER — Encounter: Payer: Self-pay | Admitting: Family Medicine

## 2020-02-03 ENCOUNTER — Other Ambulatory Visit: Payer: Self-pay

## 2020-02-03 VITALS — BP 132/80 | HR 97 | Temp 98.3°F | Ht 66.0 in | Wt >= 6400 oz

## 2020-02-03 DIAGNOSIS — N912 Amenorrhea, unspecified: Secondary | ICD-10-CM | POA: Diagnosis not present

## 2020-02-03 DIAGNOSIS — E1165 Type 2 diabetes mellitus with hyperglycemia: Secondary | ICD-10-CM

## 2020-02-03 DIAGNOSIS — Z01419 Encounter for gynecological examination (general) (routine) without abnormal findings: Secondary | ICD-10-CM | POA: Insufficient documentation

## 2020-02-03 LAB — HEMOGLOBIN A1C: Hgb A1c MFr Bld: 9.1 % — ABNORMAL HIGH (ref 4.6–6.5)

## 2020-02-03 LAB — HCG, QUANTITATIVE, PREGNANCY: Quantitative HCG: <0.6 m[IU]/mL

## 2020-02-03 NOTE — Patient Instructions (Signed)
Give us 2-3 business days to get the results of your labs back.   Keep the diet clean and stay active.  Let us know if you need anything. 

## 2020-02-03 NOTE — Progress Notes (Signed)
Subjective:   Chief Complaint  Patient presents with  . Follow-up    Laura Benson is a 30 y.o. female here for follow-up of diabetes.   Last A1c was 12.3 Laura Benson's self monitored glucose range is 90-low 100's.  Patient denies hypoglycemic reactions. She checks her glucose levels 2 time(s) per day. Patient does require insulin.   Medications include: Lantus 16 u Diet is healthy.  Exercise: walking No CP or SOB.   Has not had period in 2 mo. She is sexually active. POC preg test neg yesterday. Her sugars have dramatically dropped since starting insulin around the same time. Having pelvic pain and breast pain.   Past Medical History:  Diagnosis Date  . Asthma   . Depression   . Diabetes mellitus without complication (HCC)   . Hypertension   . Morbid obesity (HCC)   . Sleep apnea      Related testing: Retinal exam: Done Pneumovax: done  Objective:  BP 132/80 (BP Location: Left Arm, Patient Position: Sitting, Cuff Size: Large)   Pulse 97   Temp 98.3 F (36.8 C) (Oral)   Ht 5\' 6"  (1.676 m)   Wt (!) 486 lb (220.4 kg)   SpO2 93%   BMI 78.44 kg/m  General:  Well developed, well nourished, in no apparent distress Skin:  Warm, no pallor or diaphoresis Lungs:  CTAB, no access msc use Cardio:  RRR Psych: Age appropriate judgment and insight  Assessment:   Type 2 diabetes mellitus with hyperglycemia, without long-term current use of insulin (HCC) - Plan: Hemoglobin A1c  Morbid obesity (HCC)  Amenorrhea - Plan: B-HCG Quant   Plan:   1. Ck A1c. Goal is <9 to get bariatric surg. I will be in touch with Dr. depending on the results as her diabetes seems to be controlled.  2. Counseled on diet and exercise. 3. Ck blood test for pt. Discussed hormonal tx for pains and also that the sig lower sugar could be mimicking a state of starvation causing this.  F/u in 3-6 mo pending above. The patient voiced understanding and agreement to the plan.  Andrey Campanile  [place], DO 02/03/20 10:46 AM

## 2020-02-03 NOTE — Assessment & Plan Note (Signed)
Pap done

## 2020-02-03 NOTE — Assessment & Plan Note (Signed)
preg test is neg Pt will discuss further with pcp tomorrow

## 2020-02-07 LAB — CYTOLOGY - PAP: Diagnosis: NEGATIVE

## 2020-02-23 ENCOUNTER — Ambulatory Visit (INDEPENDENT_AMBULATORY_CARE_PROVIDER_SITE_OTHER): Payer: BC Managed Care – PPO | Admitting: Psychology

## 2020-02-23 DIAGNOSIS — F509 Eating disorder, unspecified: Secondary | ICD-10-CM

## 2020-02-29 MED ORDER — ALBUTEROL SULFATE HFA 108 (90 BASE) MCG/ACT IN AERS: 2.0000 | INHALATION_SPRAY | Freq: Four times a day (QID) | RESPIRATORY_TRACT | 2 refills | Status: DC | PRN

## 2020-03-16 ENCOUNTER — Ambulatory Visit: Payer: BC Managed Care – PPO | Admitting: Family Medicine

## 2020-03-19 ENCOUNTER — Ambulatory Visit: Payer: BC Managed Care – PPO | Admitting: Family Medicine

## 2020-03-23 ENCOUNTER — Other Ambulatory Visit: Payer: Self-pay | Admitting: Family Medicine

## 2020-03-23 ENCOUNTER — Ambulatory Visit: Payer: BC Managed Care – PPO | Admitting: Family Medicine

## 2020-03-23 DIAGNOSIS — Z0289 Encounter for other administrative examinations: Secondary | ICD-10-CM

## 2020-03-23 DIAGNOSIS — G4733 Obstructive sleep apnea (adult) (pediatric): Secondary | ICD-10-CM

## 2020-03-26 ENCOUNTER — Other Ambulatory Visit: Payer: Self-pay | Admitting: Family Medicine

## 2020-03-26 ENCOUNTER — Encounter: Payer: BC Managed Care – PPO | Attending: General Surgery | Admitting: Skilled Nursing Facility1

## 2020-03-26 ENCOUNTER — Other Ambulatory Visit: Payer: Self-pay

## 2020-03-26 DIAGNOSIS — E119 Type 2 diabetes mellitus without complications: Secondary | ICD-10-CM | POA: Diagnosis present

## 2020-03-26 DIAGNOSIS — F339 Major depressive disorder, recurrent, unspecified: Secondary | ICD-10-CM

## 2020-03-26 DIAGNOSIS — E669 Obesity, unspecified: Secondary | ICD-10-CM | POA: Insufficient documentation

## 2020-03-28 NOTE — Progress Notes (Signed)
Pre-Operative Nutrition Class:  Appt start time: 5053   End time:  1830.  Patient was seen on 03/26/2020 for Pre-Operative Bariatric Surgery Education [facility]    Surgery date: 04/16/2020 Surgery type: sleeve Start weight at [facility]: 490 Weight today: virtual class; pt identified via name and DOB, pt agrees to the limitations of this visit type  The following the learning objectives were met by the patient during this course:  Identify Pre-Op Dietary Goals and will begin 2 weeks pre-operatively  Identify appropriate sources of fluids and proteins   State protein recommendations and appropriate sources pre and post-operatively  Identify Post-Operative Dietary Goals and will follow for 2 weeks post-operatively  Identify appropriate multivitamin and calcium sources  Describe the need for physical activity post-operatively and will follow MD recommendations  State when to call healthcare provider regarding medication questions or post-operative complications  Handouts given during class include:  Pre-Op Bariatric Surgery Diet Handout  Protein Shake Handout  Post-Op Bariatric Surgery Nutrition Handout  [facility] Program Information Flyer  Support Group Information Flyer  [facility] Bariatric Supplements Price List  Follow-Up Plan: Patient will follow-up at [facility] 2 weeks post operatively for diet advancement per MD.

## 2020-04-03 ENCOUNTER — Ambulatory Visit: Payer: Self-pay | Admitting: General Surgery

## 2020-04-03 NOTE — Progress Notes (Signed)
Requested orders in epic from surgeon. 

## 2020-04-04 NOTE — Patient Instructions (Addendum)
DUE TO COVID-19 ONLY ONE VISITOR IS ALLOWED TO COME WITH YOU AND STAY IN THE WAITING ROOM ONLY DURING PRE OP AND PROCEDURE DAY OF SURGERY. THE 1 VISITOR  MAY VISIT WITH YOU AFTER SURGERY IN YOUR PRIVATE ROOM DURING VISITING HOURS ONLY!  YOU NEED TO HAVE A COVID 19 TEST ON: 04/12/20 @ 2:30 PM , THIS TEST MUST BE DONE BEFORE SURGERY,  COVID TESTING SITE [address] [place] [id number], IT IS ON THE RIGHT GOING OUT [address] APPROXIMATELY  2 MINUTES PAST [employer] ON THE RIGHT. ONCE YOUR COVID TEST IS COMPLETED,  PLEASE BEGIN THE QUARANTINE INSTRUCTIONS AS OUTLINED IN YOUR HANDOUT.                Laura Benson   Your procedure is scheduled on: 04/16/20   Report to [facility] Main  Entrance   Report to admitting at: 9:15 AM     Call this number if you have problems the morning of surgery [phone]    Remember:   MORNING OF SURGERY DRINK:   DRINK 1 G2 drink BEFORE YOU LEAVE HOME, DRINK ALL OF THE  G2 DRINK AT ONE TIME.   NO SOLID FOOD AFTER 600 PM THE NIGHT BEFORE YOUR SURGERY. YOU MAY DRINK CLEAR FLUIDS. THE G2 DRINK YOU DRINK BEFORE YOU LEAVE HOME WILL BE THE LAST FLUIDS YOU DRINK BEFORE SURGERY.  PAIN IS EXPECTED AFTER SURGERY AND WILL NOT BE COMPLETELY ELIMINATED. AMBULATION AND TYLENOL WILL HELP REDUCE INCISIONAL AND GAS PAIN. MOVEMENT IS KEY!  YOU ARE EXPECTED TO BE OUT OF BED WITHIN 4 HOURS OF ADMISSION TO YOUR PATIENT ROOM.  SITTING IN THE RECLINER THROUGHOUT THE DAY IS IMPORTANT FOR DRINKING FLUIDS AND MOVING GAS THROUGHOUT THE GI TRACT.  COMPRESSION STOCKINGS SHOULD BE WORN [facility] STAY UNLESS YOU ARE WALKING.   INCENTIVE SPIROMETER SHOULD BE USED EVERY HOUR WHILE AWAKE TO DECREASE POST-OPERATIVE COMPLICATIONS SUCH AS PNEUMONIA.  WHEN DISCHARGED HOME, IT IS IMPORTANT TO CONTINUE TO WALK EVERY HOUR AND USE THE INCENTIVE SPIROMETER EVERY HOUR.   CLEAR LIQUID DIET  Foods Allowed                                                                      Foods Excluded  Coffee and tea, regular and decaf                             liquids that you cannot  Plain Jell-O any favor except red or purple                                           see through such as: Fruit ices (not with fruit pulp)                                     milk, soups, orange juice  Iced Popsicles  All solid food Carbonated beverages, regular and diet                                    Cranberry, grape and apple juices Sports drinks like Gatorade Lightly seasoned clear broth or consume(fat free) Sugar, honey syrup  Sample Menu Breakfast                                Lunch                                     Supper Cranberry juice                    Beef broth                            Chicken broth Jell-O                                     Grape juice                           Apple juice Coffee or tea                        Jell-O                                      Popsicle                                                Coffee or tea                        Coffee or tea  _____________________________________________________________________   BRUSH YOUR TEETH MORNING OF SURGERY AND RINSE YOUR MOUTH OUT, NO CHEWING GUM CANDY OR MINTS.    Take these medicines the morning of surgery with A SIP OF WATER: escitalopram,metoprolol. Use inhalers as usual.Hydroxyzine as needed.  How to Manage Your Diabetes Before and After Surgery  Why is it important to control my blood sugar before and after surgery? . Improving blood sugar levels before and after surgery helps healing and can limit problems. . A way of improving blood sugar control is eating a healthy diet by: o  Eating less sugar and carbohydrates o  Increasing activity/exercise o  Talking with your doctor about reaching your blood sugar goals . High blood sugars (greater than 180 mg/dL) can raise your risk of infections and slow your recovery, so you  will need to focus on controlling your diabetes during the weeks before surgery. . Make sure that the doctor who takes care of your diabetes knows about your planned surgery including the date and location.  How do I manage my blood sugar before surgery? . Check your blood sugar at least 4 times a day, starting 2 days before surgery, to make sure that  the level is not too high or low. o Check your blood sugar the morning of your surgery when you wake up and every 2 hours until you get to the Short Stay unit. . If your blood sugar is less than 70 mg/dL, you will need to treat for low blood sugar: o Do not take insulin. o Treat a low blood sugar (less than 70 mg/dL) with  cup of clear juice (cranberry or apple), 4 glucose tablets, OR glucose gel. o Recheck blood sugar in 15 minutes after treatment (to make sure it is greater than 70 mg/dL). If your blood sugar is not greater than 70 mg/dL on recheck, call [phone] for further instructions. . Report your blood sugar to the short stay nurse when you get to Short Stay.  . If you are admitted to the hospital after surgery: o Your blood sugar will be checked by the staff and you will probably be given insulin after surgery (instead of oral diabetes medicines) to make sure you have good blood sugar levels. o The goal for blood sugar control after surgery is 80-180 mg/dL.   WHAT DO I DO ABOUT MY DIABETES MEDICATION?  Marland Kitchen Do not take oral diabetes medicines (pills) the morning of surgery.  . THE DAY BEFORE SURGERY, take Metformin as usual. Take only morning dose of gliburide, DO NOT take evening dose. Take half dose od insuline.       THE MORNING OF SURGERY, DO NOT TAKE ANY DIABETIC MEDICATIONS DAY OF YOUR SURGERY                               You may not have any metal on your body including hair pins and              piercings  Do not wear jewelry, make-up, lotions, powders or perfumes, deodorant             Do not wear nail polish on your  fingernails.  Do not shave  48 hours prior to surgery.    Do not bring valuables to the hospital. [facility] IS NOT             RESPONSIBLE   FOR VALUABLES.  Contacts, dentures or bridgework may not be worn into surgery.  Leave suitcase in the car. After surgery it may be brought to your room.     Patients discharged the day of surgery will not be allowed to drive home. IF YOU ARE HAVING SURGERY AND GOING HOME THE SAME DAY, YOU MUST HAVE AN ADULT TO DRIVE YOU HOME AND BE WITH YOU FOR 24 HOURS. YOU MAY GO HOME BY TAXI OR UBER OR ORTHERWISE, BUT AN ADULT MUST ACCOMPANY YOU HOME AND STAY WITH YOU FOR 24 HOURS.  Name and phone number of your driver:  Special Instructions: N/A              Please read over the following fact sheets you were given: ____________________________________________________________________          [facility] - Preparing for Surgery Before surgery, you can play an important role.  Because skin is not sterile, your skin needs to be as free of germs as possible.  You can reduce the number of germs on your skin by washing with CHG (chlorahexidine gluconate) soap before surgery.  CHG is an antiseptic cleaner which kills germs and bonds with the skin to continue killing germs even after washing.  Please DO NOT use if you have an allergy to CHG or antibacterial soaps.  If your skin becomes reddened/irritated stop using the CHG and inform your nurse when you arrive at Short Stay. Do not shave (including legs and underarms) for at least 48 hours prior to the first CHG shower.  You may shave your face/neck. Please follow these instructions carefully:  1.  Shower with CHG Soap the night before surgery and the  morning of Surgery.  2.  If you choose to wash your hair, wash your hair first as usual with your  normal  shampoo.  3.  After you shampoo, rinse your hair and body thoroughly to remove the  shampoo.                           4.  Use CHG as you would any other liquid soap.   You can apply chg directly  to the skin and wash                       Gently with a scrungie or clean washcloth.  5.  Apply the CHG Soap to your body ONLY FROM THE NECK DOWN.   Do not use on face/ open                           Wound or open sores. Avoid contact with eyes, ears mouth and genitals (private parts).                       Wash face,  Genitals (private parts) with your normal soap.             6.  Wash thoroughly, paying special attention to the area where your surgery  will be performed.  7.  Thoroughly rinse your body with warm water from the neck down.  8.  DO NOT shower/wash with your normal soap after using and rinsing off  the CHG Soap.                9.  Pat yourself dry with a clean towel.            10.  Wear clean pajamas.            11.  Place clean sheets on your bed the night of your first shower and do not  sleep with pets. Day of Surgery : Do not apply any lotions/deodorants the morning of surgery.  Please wear clean clothes to the hospital/surgery center.  FAILURE TO FOLLOW THESE INSTRUCTIONS MAY RESULT IN THE CANCELLATION OF YOUR SURGERY PATIENT SIGNATURE_________________________________  NURSE SIGNATURE__________________________________  ________________________________________________________________________

## 2020-04-05 ENCOUNTER — Encounter (HOSPITAL_COMMUNITY): Payer: Self-pay

## 2020-04-05 ENCOUNTER — Encounter (HOSPITAL_COMMUNITY)
Admission: RE | Admit: 2020-04-05 | Discharge: 2020-04-05 | Disposition: A | Discharge status: Home / Self Care | Payer: BC Managed Care – PPO | Source: Ambulatory Visit | Attending: General Surgery | Admitting: General Surgery

## 2020-04-05 ENCOUNTER — Ambulatory Visit: Payer: Self-pay | Admitting: General Surgery

## 2020-04-05 ENCOUNTER — Other Ambulatory Visit: Payer: Self-pay

## 2020-04-05 DIAGNOSIS — Z01812 Encounter for preprocedural laboratory examination: Secondary | ICD-10-CM | POA: Insufficient documentation

## 2020-04-05 DIAGNOSIS — Z01818 Encounter for other preprocedural examination: Secondary | ICD-10-CM | POA: Insufficient documentation

## 2020-04-05 HISTORY — DX: Other complications of anesthesia, initial encounter: T88.59XA

## 2020-04-05 HISTORY — DX: Anxiety disorder, unspecified: F41.9

## 2020-04-05 HISTORY — DX: Tachycardia, unspecified: R00.0

## 2020-04-05 HISTORY — DX: Pneumonia, unspecified organism: J18.9

## 2020-04-05 LAB — CBC WITH DIFFERENTIAL/PLATELET
Abs Immature Granulocytes: 0.01 10*3/uL (ref 0.00–0.07)
Basophils Absolute: 0 10*3/uL (ref 0.0–0.1)
Basophils Relative: 1 %
Eosinophils Absolute: 0.1 10*3/uL (ref 0.0–0.5)
Eosinophils Relative: 1 %
HCT: 46.2 % — ABNORMAL HIGH (ref 36.0–46.0)
Hemoglobin: 14.1 g/dL (ref 12.0–15.0)
Immature Granulocytes: 0 %
Lymphocytes Relative: 44 %
Lymphs Abs: 2.2 10*3/uL (ref 0.7–4.0)
MCH: 23.6 pg — ABNORMAL LOW (ref 26.0–34.0)
MCHC: 30.5 g/dL (ref 30.0–36.0)
MCV: 77.4 fL — ABNORMAL LOW (ref 80.0–100.0)
Monocytes Absolute: 0.6 10*3/uL (ref 0.1–1.0)
Monocytes Relative: 11 %
Neutro Abs: 2.2 10*3/uL (ref 1.7–7.7)
Neutrophils Relative %: 43 %
Platelets: 327 10*3/uL (ref 150–400)
RBC: 5.97 MIL/uL — ABNORMAL HIGH (ref 3.87–5.11)
RDW: 14.3 % (ref 11.5–15.5)
WBC: 5 10*3/uL (ref 4.0–10.5)
nRBC: 0 % (ref 0.0–0.2)

## 2020-04-05 LAB — TYPE AND SCREEN
ABO/RH(D): O POS
Antibody Screen: NEGATIVE

## 2020-04-05 LAB — COMPREHENSIVE METABOLIC PANEL
ALT: 40 U/L (ref 0–44)
AST: 33 U/L (ref 15–41)
Albumin: 4.3 g/dL (ref 3.5–5.0)
Alkaline Phosphatase: 62 U/L (ref 38–126)
Anion gap: 8 (ref 5–15)
BUN: 9 mg/dL (ref 6–20)
CO2: 26 mmol/L (ref 22–32)
Calcium: 9.3 mg/dL (ref 8.9–10.3)
Chloride: 103 mmol/L (ref 98–111)
Creatinine, Ser: 0.84 mg/dL (ref 0.44–1.00)
GFR, Estimated: >60 mL/min (ref >60)
Glucose, Bld: 162 mg/dL — ABNORMAL HIGH (ref 70–99)
Potassium: 3.8 mmol/L (ref 3.5–5.1)
Sodium: 137 mmol/L (ref 135–145)
Total Bilirubin: 1 mg/dL (ref 0.3–1.2)
Total Protein: 7.9 g/dL (ref 6.5–8.1)

## 2020-04-05 LAB — HEMOGLOBIN A1C
Hgb A1c MFr Bld: 8.9 % — ABNORMAL HIGH (ref 4.8–5.6)
Mean Plasma Glucose: 208.73 mg/dL

## 2020-04-05 NOTE — H&P (Signed)
Laura Benson Appointment: 04/05/2020 11:00 AM Location: [facility] Patient #: [id number] DOB: 09-Sep-1990 Single / Language: Laura Benson / Race: Black or African American Female  History of Present Illness Randall Hiss M. Hoy Fallert MD; 04/05/2020 5:52 PM) The patient is a 30 year old female who presents with an umbilical hernia. She comes in for long-term follow-up regarding her severe obesity. I initially met her in consultation to discuss an umbilical hernia that given her significant risk for hernia recurrence with surgery at her current body weight we discussed bariatric surgery. She has completed the pathway. Her pathway was a little bit prolonged because she was working on getting her diabetes under better control. She denies any significant medical events since I last saw her. She states that she did have an issue with a lower back and had sciatica ended up going to physical therapy. She states that that is resolved. She is currently seeking disability. Her A1c has improved from 12-9.1 on February 03, 2020. She denies any chest pain, chest tightness, chest pressure. She'll occasionally have some asthma issues. She is compliant with using her inhalers. She states that she is down 20 pounds since starting the preoperative milk plan. Her upper GI on November 30 was unremarkable. Lipid panel in October showed a total cholesterol of 201, triglyceride 147, HDL 40. A comprehensive metabolic panel in October was unremarkable except for a blood glucose level which was elevated. She uses CPAP for sleep apnea.   03/18/19 She comes in today to formally discuss weight loss surgery. I have seen her on several prior occasions. Please see my notes below from her prior visits. She completed our Neurosurgeon. She is interested in sleeve gastrectomy.  Her comorbidities include hypertension, diabetes mellitus type 2 and obstructive sleep apnea.  She denies any chest pain, chest tightness,  chest pressure, shortness of breath, orthopnea. She occasionally may have some dyspnea on exertion. She denies any prior blood clots. She had a sleep study in 2018 which I reviewed which is reviewed at the end of the note. She is currently not using CPAP. She states that she didn't tolerate anything on her face but admits that she did not follow-up with the prescribing doctor to let them know the issue she was having with it. She works third shift. She occasionally will have some heartburn to specific foods such as tomato-based products, mustard and barbecue sauce. She does not take any medications for it she will drink milk. She has a known umbilical hernia containing fat. She denies any melena or hematochezia. She denies any dysuria.  She has been treated for depression for many years. She has had at least one to 2 admissions over the past 12 years for depression episodes and some occasional suicidal ideations. She currently has no plans for suicide or harming herself. sHe does not smoke. No drug use. Occasional alcohol use  sHe works third shift for [employer]. She explains that as part of the reason that she cancels or misses some appointment during the daytime it is because she is sleeping. she is working 6 dys a week  She had an A1c of 10.3 in December 2020. She was prescribed a once a week injection for diabetes as well as weight loss but she stopped taking it. She states that she is supposed to take it at work but it just doesn't fit into the work schedule. She is trying to make some lifestyle improvements.  Total cholesterol 163, triglycerides 123, HDL 38, LDL 100 dec  2020   12/31/2018 She comes in for an additional visit regarding her known umbilical hernia. I last saw her in June of this year. She did not proceed with hernia surgery that time until she had a lot of stress going on. She was having issues with her landlord. She states that she is now ready to proceed with  hernia surgery. She denies any medical changes since I last saw her other then development of some right lower back pain with some numbness in her thigh. She uses CPAP intermittently. She does not smoke. She denies any nausea, vomiting, diarrhea or constipation. She is having more pain in her lower back and around her hernia. She will have cramps around her hernia generally when getting out of bed her first thing in the morning.  06/2018 She comes in for long-term follow-up regarding her known umbilical hernia. She states that she is having ongoing problems with periumbilical pain. It is getting worse. She is now not able to work at the [employer]. She states that she will get pain around her umbilicus middle last 3-5 days and then eased up. The pain is generally triggered by physical activity such as lifting boxes. She denies any nausea, vomiting, diarrhea or constipation. She denies any chest pain or chest pressure. She currently is not using CPAP. She lives alone. She is not smoking. She is no longer drinking alcohol. She states that she did lose about 60 pounds since her last visit but unfortunately gained it all back due to stress in her life as well as psychosocial issues. She had a CT scan last year which revealed an umbilical hernia measuring about 3 cm x 1.6 cm. She states that when she had her tonsils out about 10 or 11 years ago she was in the hospital for about 10 days in a coma she thinks - she states that she was severely obese at that time as well.  02/2017 She is referred by Dr Riki Sheer for evaluation of an umbilical hernia. She states about 3 weeks ago she had some discomfort in her mid abdomen. She initially was sick on a Tuesday and vomited. But then on Friday she started having severe periumbilical pain. Worse with pushing up against the structure. An worse with lifting items. She went to the emergency department and was found to have a fat-containing  umbilical hernia. She has not had that severe discomfort. She feels some occasional discomfort. She denies any fever, chills, nausea or vomiting. She does have some chronic constipation. She denies any tobacco use. She is been working on making some daily habit changes to improve her weight. She has cut back on soda and is just drinking water and diet tea. She takes medicine for hypertension, diabetes mellitus, and uses CPAP. She does not smoke. She denies any prior abdominal surgery   Problem List/Past Medical Randall Hiss M. Redmond Pulling, MD; 04/05/2020 5:57 PM) SHIFTING SLEEP-WORK SCHEDULE (G47.26) HISTORY OF SUICIDAL IDEATION (Z86.59) FATIGUE (Y65.99) UMBILICAL HERNIA WITHOUT OBSTRUCTION OR GANGRENE (K42.9) I reviewed her CT scan. She has a fat-containing umbilical hernia. We rediscussed the etiology of umbilical hernias. OBESITY, MORBID, BMI 50 OR HIGHER (E66.01) Patient meets weight loss surgery criteria  This patient encounter took 25 minutes today to perform the following: take history, perform exam, review outside records, interpret imaging, counsel the patient on their diagnosis, discussed surgery, lifestyle changes needed and document encounter, findings & plan in the EHR  Past Surgical History Randall Hiss M. Redmond Pulling, MD; 04/05/2020 5:57 PM) Tonsillectomy  Diagnostic Studies History Randall Hiss M. Redmond Pulling, MD; 04/05/2020 5:57 PM) Colonoscopy never Mammogram never Pap Smear 1-5 years ago  Allergies Carrolyn Leigh, CMA; 04/05/2020 10:59 AM) No Known Allergies [03/18/2019]: No Known Drug Allergies [03/11/2017]: Allergies Reconciled  Medication History Carrolyn Leigh, CMA; 04/05/2020 10:59 AM) hydrOXYzine HCl (25MG Tablet, Oral) Active. Lisinopril-hydroCHLOROthiazide (20-12.5MG Tablet, Oral) Active. metFORMIN HCl (1000MG Tablet, Oral) Active. Metoprolol Succinate ER (25MG Tablet ER 24HR, Oral) Active. Sertraline HCl (100MG Tablet, Oral) Active. Medications Reconciled  Social History Randall Hiss  M. Redmond Pulling, MD; 04/05/2020 5:57 PM) Alcohol use Occasional alcohol use. Caffeine use Coffee, Tea. No drug use Tobacco use Former smoker.  Family History Randall Hiss M. Redmond Pulling, MD; 04/05/2020 5:57 PM) Arthritis Mother. Colon Cancer Father. Diabetes Mellitus Father, Mother, Sister. Hypertension Father. Kidney Disease Family Members In General. Migraine Headache Sister.  Pregnancy / Birth History Randall Hiss M. Redmond Pulling, MD; 04/05/2020 5:57 PM) Age at menarche 46 years, 9 years. Contraceptive History Depo-provera, Oral contraceptives. Gravida 0 Irregular periods Para 0  Other Problems Randall Hiss M. 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 Janeese Mcgloin MD; 04/05/2020 5:48 PM) General Not Present- Appetite Loss, Chills, Fatigue, Fever, Night Sweats, Weight Gain and Weight Loss. Skin Present- Dryness. Not Present- Change in Wart/Mole, Hives, Jaundice, New Lesions, Non-Healing Wounds, Rash and Ulcer. HEENT Present- Wears glasses/contact lenses. Not Present- Earache, Hearing Loss, Hoarseness, Nose Bleed, Oral Ulcers, Ringing in the Ears, Seasonal Allergies, Sinus Pain, Sore Throat, Visual Disturbances and Yellow Eyes. Respiratory Not Present- Bloody sputum, Chronic Cough, Difficulty Breathing, Snoring and Wheezing. Breast Not Present- Breast Mass, Breast Pain, Nipple Discharge and Skin Changes. Cardiovascular Present- Difficulty Breathing Lying Down, Leg Cramps and Shortness of Breath. Not Present- Chest Pain, Palpitations, Rapid Heart Rate and Swelling of Extremities. Gastrointestinal Present- Nausea. Not Present- Abdominal Pain, Bloating, Bloody Stool,  Change in Bowel Habits, Chronic diarrhea, Constipation, Difficulty Swallowing, Excessive gas, Gets full quickly at meals, Hemorrhoids, Indigestion, Rectal Pain and Vomiting. Female Genitourinary Present- Nocturia. Not Present- Frequency, Painful Urination, Pelvic Pain and Urgency. Musculoskeletal Present- Back Pain and Joint Stiffness. Not Present- Joint Pain, Muscle Pain, Muscle Weakness and Swelling of Extremities. Neurological Present- Headaches, Numbness and Tingling. Not Present- Decreased Memory, Fainting, Seizures, Tremor, Trouble walking and Weakness. Psychiatric Present- Change in Sleep Pattern and Depression. Not Present- Anxiety, Bipolar, Fearful and Frequent crying. All other systems negative  Vitals Randall Hiss M. Ledia Hanford MD; 04/05/2020 5:55 PM) 04/05/2020 5:54 PM Weight: 468.6 lb Height: 66in Body Surface Area: 2.87 m Body Mass Index: 75.63 kg/m  Temp.: 97.59F  Pulse: 120 (Regular)  BP: 140/82(Sitting, Left Arm, Standard)        Physical Exam Randall Hiss M. Christean Silvestri MD; 04/05/2020 5:48 PM)  General Mental Status-Alert. General Appearance-Consistent with stated age. Posture-Normal posture. Voice-Normal.  Integumentary Note: no rash or lesion on limited exam  Head and Neck Head -Note:atraumatic, normocephalic.  Face Strength and Tone - facial muscle strength and tone is normal.  Eye Eyeball - Bilateral-Extraocular movements intact. Sclera/Conjunctiva - Bilateral-Normal.  Chest and Lung Exam Chest and lung exam reveals -quiet, even and easy respiratory effort with no use of accessory muscles.  Neuropsychiatric Mental status exam performed with findings of-able to articulate well with normal speech/language, rate, volume and coherence. The patient's mood and affect  are described as -normal. Judgment and Insight-insight is appropriate concerning matters relevant to self and the patient displays appropriate judgment regarding every day  activities. Thought Processes/Cognitive Function-aware of current events.  Musculoskeletal Note: strength symmetrical throughout, no deformity    Assessment & Plan Randall Hiss M. Saniya Tranchina MD; 04/05/2020 5:57 PM)  OBESITY, MORBID, BMI 50 OR HIGHER (E66.01) Story: Patient meets weight loss surgery criteria  This patient encounter took 25 minutes today to perform the following: take history, perform exam, review outside records, interpret imaging, counsel the patient on their diagnosis, discussed surgery, lifestyle changes needed and document encounter, findings & plan in the EHR Impression: The patient meets weight loss surgery criteria. I think the patient would be an acceptable candidate for Laparoscopic vertical sleeve gastrectomy.  I congratulated her for her 20 pound weight loss despite being placed on insulin. I congratulated her for improving her A1c. We reviewed her workup for bariatric surgery. She has attended her preop education class.  I offered to rediscussed steps along with risk and benefits of surgery however she declined. We discussed them on several prior occasions.  She does have a remote history of trouble waking up from anesthesia after having her tonsils out in she's a little bit nervous about that. We will try to arrange a preoperative anesthesia consult so she can discuss her concerns with them. I told her that she may be placed on BiPAP/CPAP in the recovery room immediately after surgery  I congratulated her on the compliant she is demonstrated because she has been compliant with some weight loss as well as improving her diabetes and working with her PCP I think we can proceed with surgery. She has not had any unplanned mental health encounters either  Current Plans Pt Education - EMW_preopbariatric  UMBILICAL HERNIA WITHOUT OBSTRUCTION OR GANGRENE (K42.9) Story: I reviewed her CT scan. She has a fat-containing umbilical hernia. We rediscussed the etiology of umbilical  hernias. Impression: Plan umbilical hernia repair after weight loss surgery.   OBSTRUCTIVE SLEEP APNEA ON CPAP (G47.33) Impression: She states that she is using CPAP now   HYPERTENSION, ESSENTIAL (I10)   DIABETES MELLITUS TYPE 2 IN OBESE (E11.69) Impression: I congratulated her on her improvement in her diabetes. She went from 12 down to T10 down to 9.1. I think since she is demonstrated compliance with her diabetes regimen we can proceed with surgery  Leighton Ruff. Redmond Pulling, MD, FACS General, Bariatric, & Minimally Invasive Surgery [facility], [place]

## 2020-04-05 NOTE — H&P (View-Only) (Signed)
Laura Benson Appointment: 04/05/2020 11:00 AM Location: [facility] Patient #: [id number] DOB: 1990/03/12 Single / Language: Laura Benson / Race: Black or African American Female  History of Present Illness Randall Hiss M. Bobbi Yount MD; 04/05/2020 5:52 PM) The patient is a 30 year old female who presents with an umbilical hernia. She comes in for long-term follow-up regarding her severe obesity. I initially met her in consultation to discuss an umbilical hernia that given her significant risk for hernia recurrence with surgery at her current body weight we discussed bariatric surgery. She has completed the pathway. Her pathway was a little bit prolonged because she was working on getting her diabetes under better control. She denies any significant medical events since I last saw her. She states that she did have an issue with a lower back and had sciatica ended up going to physical therapy. She states that that is resolved. She is currently seeking disability. Her A1c has improved from 12-9.1 on February 03, 2020. She denies any chest pain, chest tightness, chest pressure. She'll occasionally have some asthma issues. She is compliant with using her inhalers. She states that she is down 20 pounds since starting the preoperative milk plan. Her upper GI on November 30 was unremarkable. Lipid panel in October showed a total cholesterol of 201, triglyceride 147, HDL 40. A comprehensive metabolic panel in October was unremarkable except for a blood glucose level which was elevated. She uses CPAP for sleep apnea.   03/18/19 She comes in today to formally discuss weight loss surgery. I have seen her on several prior occasions. Please see my notes below from her prior visits. She completed our Neurosurgeon. She is interested in sleeve gastrectomy.  Her comorbidities include hypertension, diabetes mellitus type 2 and obstructive sleep apnea.  She denies any chest pain, chest tightness,  chest pressure, shortness of breath, orthopnea. She occasionally may have some dyspnea on exertion. She denies any prior blood clots. She had a sleep study in 2018 which I reviewed which is reviewed at the end of the note. She is currently not using CPAP. She states that she didn't tolerate anything on her face but admits that she did not follow-up with the prescribing doctor to let them know the issue she was having with it. She works third shift. She occasionally will have some heartburn to specific foods such as tomato-based products, mustard and barbecue sauce. She does not take any medications for it she will drink milk. She has a known umbilical hernia containing fat. She denies any melena or hematochezia. She denies any dysuria.  She has been treated for depression for many years. She has had at least one to 2 admissions over the past 12 years for depression episodes and some occasional suicidal ideations. She currently has no plans for suicide or harming herself. sHe does not smoke. No drug use. Occasional alcohol use  sHe works third shift for [employer]. She explains that as part of the reason that she cancels or misses some appointment during the daytime it is because she is sleeping. she is working 6 dys a week  She had an A1c of 10.3 in December 2020. She was prescribed a once a week injection for diabetes as well as weight loss but she stopped taking it. She states that she is supposed to take it at work but it just doesn't fit into the work schedule. She is trying to make some lifestyle improvements.  Total cholesterol 163, triglycerides 123, HDL 38, LDL 100 dec  2020   12/31/2018 She comes in for an additional visit regarding her known umbilical hernia. I last saw her in June of this year. She did not proceed with hernia surgery that time until she had a lot of stress going on. She was having issues with her landlord. She states that she is now ready to proceed with  hernia surgery. She denies any medical changes since I last saw her other then development of some right lower back pain with some numbness in her thigh. She uses CPAP intermittently. She does not smoke. She denies any nausea, vomiting, diarrhea or constipation. She is having more pain in her lower back and around her hernia. She will have cramps around her hernia generally when getting out of bed her first thing in the morning.  06/2018 She comes in for long-term follow-up regarding her known umbilical hernia. She states that she is having ongoing problems with periumbilical pain. It is getting worse. She is now not able to work at the [employer]. She states that she will get pain around her umbilicus middle last 3-5 days and then eased up. The pain is generally triggered by physical activity such as lifting boxes. She denies any nausea, vomiting, diarrhea or constipation. She denies any chest pain or chest pressure. She currently is not using CPAP. She lives alone. She is not smoking. She is no longer drinking alcohol. She states that she did lose about 60 pounds since her last visit but unfortunately gained it all back due to stress in her life as well as psychosocial issues. She had a CT scan last year which revealed an umbilical hernia measuring about 3 cm x 1.6 cm. She states that when she had her tonsils out about 10 or 11 years ago she was in the hospital for about 10 days in a coma she thinks - she states that she was severely obese at that time as well.  02/2017 She is referred by Dr Riki Sheer for evaluation of an umbilical hernia. She states about 3 weeks ago she had some discomfort in her mid abdomen. She initially was sick on a Tuesday and vomited. But then on Friday she started having severe periumbilical pain. Worse with pushing up against the structure. An worse with lifting items. She went to the emergency department and was found to have a fat-containing  umbilical hernia. She has not had that severe discomfort. She feels some occasional discomfort. She denies any fever, chills, nausea or vomiting. She does have some chronic constipation. She denies any tobacco use. She is been working on making some daily habit changes to improve her weight. She has cut back on soda and is just drinking water and diet tea. She takes medicine for hypertension, diabetes mellitus, and uses CPAP. She does not smoke. She denies any prior abdominal surgery   Problem List/Past Medical Randall Hiss M. Redmond Pulling, MD; 04/05/2020 5:57 PM) SHIFTING SLEEP-WORK SCHEDULE (G47.26) HISTORY OF SUICIDAL IDEATION (Z86.59) FATIGUE (Y65.99) UMBILICAL HERNIA WITHOUT OBSTRUCTION OR GANGRENE (K42.9) I reviewed her CT scan. She has a fat-containing umbilical hernia. We rediscussed the etiology of umbilical hernias. OBESITY, MORBID, BMI 50 OR HIGHER (E66.01) Patient meets weight loss surgery criteria  This patient encounter took 25 minutes today to perform the following: take history, perform exam, review outside records, interpret imaging, counsel the patient on their diagnosis, discussed surgery, lifestyle changes needed and document encounter, findings & plan in the EHR  Past Surgical History Randall Hiss M. Redmond Pulling, MD; 04/05/2020 5:57 PM) Tonsillectomy  Diagnostic Studies History Randall Hiss M. Redmond Pulling, MD; 04/05/2020 5:57 PM) Colonoscopy never Mammogram never Pap Smear 1-5 years ago  Allergies Carrolyn Leigh, CMA; 04/05/2020 10:59 AM) No Known Allergies [03/18/2019]: No Known Drug Allergies [03/11/2017]: Allergies Reconciled  Medication History Carrolyn Leigh, CMA; 04/05/2020 10:59 AM) hydrOXYzine HCl (25MG Tablet, Oral) Active. Lisinopril-hydroCHLOROthiazide (20-12.5MG Tablet, Oral) Active. metFORMIN HCl (1000MG Tablet, Oral) Active. Metoprolol Succinate ER (25MG Tablet ER 24HR, Oral) Active. Sertraline HCl (100MG Tablet, Oral) Active. Medications Reconciled  Social History Randall Hiss  M. Redmond Pulling, MD; 04/05/2020 5:57 PM) Alcohol use Occasional alcohol use. Caffeine use Coffee, Tea. No drug use Tobacco use Former smoker.  Family History Randall Hiss M. Redmond Pulling, MD; 04/05/2020 5:57 PM) Arthritis Mother. Colon Cancer Father. Diabetes Mellitus Father, Mother, Sister. Hypertension Father. Kidney Disease Family Members In General. Migraine Headache Sister.  Pregnancy / Birth History Randall Hiss M. Redmond Pulling, MD; 04/05/2020 5:57 PM) Age at menarche 89 years, 8 years. Contraceptive History Depo-provera, Oral contraceptives. Gravida 0 Irregular periods Para 0  Other Problems Randall Hiss M. 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 Andreea Arca MD; 04/05/2020 5:48 PM) General Not Present- Appetite Loss, Chills, Fatigue, Fever, Night Sweats, Weight Gain and Weight Loss. Skin Present- Dryness. Not Present- Change in Wart/Mole, Hives, Jaundice, New Lesions, Non-Healing Wounds, Rash and Ulcer. HEENT Present- Wears glasses/contact lenses. Not Present- Earache, Hearing Loss, Hoarseness, Nose Bleed, Oral Ulcers, Ringing in the Ears, Seasonal Allergies, Sinus Pain, Sore Throat, Visual Disturbances and Yellow Eyes. Respiratory Not Present- Bloody sputum, Chronic Cough, Difficulty Breathing, Snoring and Wheezing. Breast Not Present- Breast Mass, Breast Pain, Nipple Discharge and Skin Changes. Cardiovascular Present- Difficulty Breathing Lying Down, Leg Cramps and Shortness of Breath. Not Present- Chest Pain, Palpitations, Rapid Heart Rate and Swelling of Extremities. Gastrointestinal Present- Nausea. Not Present- Abdominal Pain, Bloating, Bloody Stool,  Change in Bowel Habits, Chronic diarrhea, Constipation, Difficulty Swallowing, Excessive gas, Gets full quickly at meals, Hemorrhoids, Indigestion, Rectal Pain and Vomiting. Female Genitourinary Present- Nocturia. Not Present- Frequency, Painful Urination, Pelvic Pain and Urgency. Musculoskeletal Present- Back Pain and Joint Stiffness. Not Present- Joint Pain, Muscle Pain, Muscle Weakness and Swelling of Extremities. Neurological Present- Headaches, Numbness and Tingling. Not Present- Decreased Memory, Fainting, Seizures, Tremor, Trouble walking and Weakness. Psychiatric Present- Change in Sleep Pattern and Depression. Not Present- Anxiety, Bipolar, Fearful and Frequent crying. All other systems negative  Vitals Randall Hiss M. Heather Streeper MD; 04/05/2020 5:55 PM) 04/05/2020 5:54 PM Weight: 468.6 lb Height: 66in Body Surface Area: 2.87 m Body Mass Index: 75.63 kg/m  Temp.: 97.8F  Pulse: 120 (Regular)  BP: 140/82(Sitting, Left Arm, Standard)        Physical Exam Randall Hiss M. Pegi Milazzo MD; 04/05/2020 5:48 PM)  General Mental Status-Alert. General Appearance-Consistent with stated age. Posture-Normal posture. Voice-Normal.  Integumentary Note: no rash or lesion on limited exam  Head and Neck Head -Note:atraumatic, normocephalic.  Face Strength and Tone - facial muscle strength and tone is normal.  Eye Eyeball - Bilateral-Extraocular movements intact. Sclera/Conjunctiva - Bilateral-Normal.  Chest and Lung Exam Chest and lung exam reveals -quiet, even and easy respiratory effort with no use of accessory muscles.  Neuropsychiatric Mental status exam performed with findings of-able to articulate well with normal speech/language, rate, volume and coherence. The patient's mood and affect  are described as -normal. Judgment and Insight-insight is appropriate concerning matters relevant to self and the patient displays appropriate judgment regarding every day  activities. Thought Processes/Cognitive Function-aware of current events.  Musculoskeletal Note: strength symmetrical throughout, no deformity    Assessment & Plan Randall Hiss M. Ajayla Iglesias MD; 04/05/2020 5:57 PM)  OBESITY, MORBID, BMI 50 OR HIGHER (E66.01) Story: Patient meets weight loss surgery criteria  This patient encounter took 25 minutes today to perform the following: take history, perform exam, review outside records, interpret imaging, counsel the patient on their diagnosis, discussed surgery, lifestyle changes needed and document encounter, findings & plan in the EHR Impression: The patient meets weight loss surgery criteria. I think the patient would be an acceptable candidate for Laparoscopic vertical sleeve gastrectomy.  I congratulated her for her 20 pound weight loss despite being placed on insulin. I congratulated her for improving her A1c. We reviewed her workup for bariatric surgery. She has attended her preop education class.  I offered to rediscussed steps along with risk and benefits of surgery however she declined. We discussed them on several prior occasions.  She does have a remote history of trouble waking up from anesthesia after having her tonsils out in she's a little bit nervous about that. We will try to arrange a preoperative anesthesia consult so she can discuss her concerns with them. I told her that she may be placed on BiPAP/CPAP in the recovery room immediately after surgery  I congratulated her on the compliant she is demonstrated because she has been compliant with some weight loss as well as improving her diabetes and working with her PCP I think we can proceed with surgery. She has not had any unplanned mental health encounters either  Current Plans Pt Education - EMW_preopbariatric  UMBILICAL HERNIA WITHOUT OBSTRUCTION OR GANGRENE (K42.9) Story: I reviewed her CT scan. She has a fat-containing umbilical hernia. We rediscussed the etiology of umbilical  hernias. Impression: Plan umbilical hernia repair after weight loss surgery.   OBSTRUCTIVE SLEEP APNEA ON CPAP (G47.33) Impression: She states that she is using CPAP now   HYPERTENSION, ESSENTIAL (I10)   DIABETES MELLITUS TYPE 2 IN OBESE (E11.69) Impression: I congratulated her on her improvement in her diabetes. She went from 12 down to T10 down to 9.1. I think since she is demonstrated compliance with her diabetes regimen we can proceed with surgery  Leighton Ruff. Redmond Pulling, MD, FACS General, Bariatric, & Minimally Invasive Surgery [facility], [place]

## 2020-04-05 NOTE — Progress Notes (Addendum)
COVID Vaccine Completed: No Date COVID Vaccine completed: COVID vaccine manufacturer: Pfizer    [facility] [employer]   PCP - Janyth Pupa P Wendling: DO. LOV: 02/03/20 Cardiologist -   Chest x-ray - 07/06/19 EKG - 07/06/19  Stress Test -  ECHO -  Cardiac Cath -  Pacemaker/ICD device last checked:  Sleep Study - Yes CPAP - Yes  Fasting Blood Sugar - 120's Checks Blood Sugar ___2__ times a day  Blood Thinner Instructions: Aspirin Instructions: Last Dose:  Anesthesia review: Hx: DIA,HTN,OSA(CPAP)  Patient denies shortness of breath, fever, cough and chest pain at PAT appointment   Patient verbalized understanding of instructions that were given to them at the PAT appointment. Patient was also instructed that they will need to review over the PAT instructions again at home before surgery.

## 2020-04-06 LAB — GLUCOSE, CAPILLARY: Glucose-Capillary: 170 mg/dL — ABNORMAL HIGH (ref 70–99)

## 2020-04-06 NOTE — Progress Notes (Signed)
A1-C: 8.9

## 2020-04-12 ENCOUNTER — Other Ambulatory Visit (HOSPITAL_COMMUNITY)
Admission: RE | Admit: 2020-04-12 | Discharge: 2020-04-12 | Disposition: A | Discharge status: Home / Self Care | Payer: BC Managed Care – PPO | Source: Ambulatory Visit | Attending: General Surgery | Admitting: General Surgery

## 2020-04-12 DIAGNOSIS — Z20822 Contact with and (suspected) exposure to covid-19: Secondary | ICD-10-CM | POA: Insufficient documentation

## 2020-04-12 DIAGNOSIS — Z01812 Encounter for preprocedural laboratory examination: Secondary | ICD-10-CM | POA: Insufficient documentation

## 2020-04-12 LAB — SARS CORONAVIRUS 2 (TAT 6-24 HRS): SARS Coronavirus 2: NEGATIVE

## 2020-04-15 MED ORDER — BUPIVACAINE LIPOSOME 1.3 % IJ SUSP
20.0000 mL | Freq: Once | INTRAMUSCULAR | Status: DC
  Filled 2020-04-15: qty 20

## 2020-04-16 ENCOUNTER — Other Ambulatory Visit: Payer: Self-pay

## 2020-04-16 ENCOUNTER — Encounter (HOSPITAL_COMMUNITY)
Admission: RE | Disposition: A | Discharge status: Home / Self Care | Payer: Self-pay | Source: Home / Self Care | Attending: General Surgery

## 2020-04-16 ENCOUNTER — Inpatient Hospital Stay (HOSPITAL_COMMUNITY): Payer: BC Managed Care – PPO | Admitting: Certified Registered Nurse Anesthetist

## 2020-04-16 ENCOUNTER — Inpatient Hospital Stay (HOSPITAL_COMMUNITY)
Admission: RE | Admit: 2020-04-16 | Discharge: 2020-04-18 | DRG: 620 | Disposition: A | Discharge status: Home / Self Care | Payer: BC Managed Care – PPO | Attending: General Surgery | Admitting: General Surgery

## 2020-04-16 ENCOUNTER — Encounter (HOSPITAL_COMMUNITY): Payer: Self-pay | Admitting: General Surgery

## 2020-04-16 DIAGNOSIS — G4726 Circadian rhythm sleep disorder, shift work type: Secondary | ICD-10-CM | POA: Diagnosis present

## 2020-04-16 DIAGNOSIS — Z8249 Family history of ischemic heart disease and other diseases of the circulatory system: Secondary | ICD-10-CM | POA: Diagnosis not present

## 2020-04-16 DIAGNOSIS — K429 Umbilical hernia without obstruction or gangrene: Secondary | ICD-10-CM | POA: Diagnosis present

## 2020-04-16 DIAGNOSIS — G4733 Obstructive sleep apnea (adult) (pediatric): Secondary | ICD-10-CM | POA: Diagnosis present

## 2020-04-16 DIAGNOSIS — Z833 Family history of diabetes mellitus: Secondary | ICD-10-CM

## 2020-04-16 DIAGNOSIS — M5440 Lumbago with sciatica, unspecified side: Secondary | ICD-10-CM | POA: Diagnosis present

## 2020-04-16 DIAGNOSIS — E079 Disorder of thyroid, unspecified: Secondary | ICD-10-CM | POA: Diagnosis present

## 2020-04-16 DIAGNOSIS — N926 Irregular menstruation, unspecified: Secondary | ICD-10-CM | POA: Diagnosis present

## 2020-04-16 DIAGNOSIS — F419 Anxiety disorder, unspecified: Secondary | ICD-10-CM | POA: Diagnosis present

## 2020-04-16 DIAGNOSIS — Z794 Long term (current) use of insulin: Secondary | ICD-10-CM

## 2020-04-16 DIAGNOSIS — J45909 Unspecified asthma, uncomplicated: Secondary | ICD-10-CM | POA: Diagnosis present

## 2020-04-16 DIAGNOSIS — Z9884 Bariatric surgery status: Secondary | ICD-10-CM

## 2020-04-16 DIAGNOSIS — K5909 Other constipation: Secondary | ICD-10-CM | POA: Diagnosis present

## 2020-04-16 DIAGNOSIS — E119 Type 2 diabetes mellitus without complications: Secondary | ICD-10-CM

## 2020-04-16 DIAGNOSIS — Z87891 Personal history of nicotine dependence: Secondary | ICD-10-CM

## 2020-04-16 DIAGNOSIS — Z8759 Personal history of other complications of pregnancy, childbirth and the puerperium: Secondary | ICD-10-CM | POA: Diagnosis present

## 2020-04-16 DIAGNOSIS — O10919 Unspecified pre-existing hypertension complicating pregnancy, unspecified trimester: Secondary | ICD-10-CM | POA: Diagnosis present

## 2020-04-16 DIAGNOSIS — E1169 Type 2 diabetes mellitus with other specified complication: Secondary | ICD-10-CM | POA: Diagnosis present

## 2020-04-16 DIAGNOSIS — I10 Essential (primary) hypertension: Secondary | ICD-10-CM | POA: Diagnosis present

## 2020-04-16 DIAGNOSIS — Z800 Family history of malignant neoplasm of digestive organs: Secondary | ICD-10-CM

## 2020-04-16 DIAGNOSIS — Z7984 Long term (current) use of oral hypoglycemic drugs: Secondary | ICD-10-CM

## 2020-04-16 DIAGNOSIS — Z79899 Other long term (current) drug therapy: Secondary | ICD-10-CM | POA: Diagnosis not present

## 2020-04-16 DIAGNOSIS — Z6841 Body Mass Index (BMI) 40.0 and over, adult: Secondary | ICD-10-CM | POA: Diagnosis not present

## 2020-04-16 DIAGNOSIS — F321 Major depressive disorder, single episode, moderate: Secondary | ICD-10-CM | POA: Diagnosis present

## 2020-04-16 DIAGNOSIS — O119 Pre-existing hypertension with pre-eclampsia, unspecified trimester: Secondary | ICD-10-CM | POA: Diagnosis present

## 2020-04-16 DIAGNOSIS — R0602 Shortness of breath: Secondary | ICD-10-CM

## 2020-04-16 DIAGNOSIS — R000 Tachycardia, unspecified: Secondary | ICD-10-CM | POA: Diagnosis not present

## 2020-04-16 DIAGNOSIS — Z8261 Family history of arthritis: Secondary | ICD-10-CM | POA: Diagnosis not present

## 2020-04-16 HISTORY — PX: UPPER GI ENDOSCOPY: SHX6162

## 2020-04-16 HISTORY — PX: LAPAROSCOPIC GASTRIC SLEEVE RESECTION: SHX5895

## 2020-04-16 LAB — PREGNANCY, URINE: Preg Test, Ur: NEGATIVE

## 2020-04-16 LAB — GLUCOSE, CAPILLARY
Glucose-Capillary: 199 mg/dL — ABNORMAL HIGH (ref 70–99)
Glucose-Capillary: 233 mg/dL — ABNORMAL HIGH (ref 70–99)
Glucose-Capillary: 211 mg/dL — ABNORMAL HIGH (ref 70–99)

## 2020-04-16 LAB — HEMOGLOBIN AND HEMATOCRIT, BLOOD
HCT: 43.4 % (ref 36.0–46.0)
Hemoglobin: 13.5 g/dL (ref 12.0–15.0)

## 2020-04-16 SURGERY — GASTRECTOMY, SLEEVE, LAPAROSCOPIC
Anesthesia: General

## 2020-04-16 MED ORDER — SUCCINYLCHOLINE CHLORIDE 200 MG/10ML IV SOSY
PREFILLED_SYRINGE | INTRAVENOUS | Status: AC
  Filled 2020-04-16: qty 10

## 2020-04-16 MED ORDER — OXYCODONE HCL 5 MG/5ML PO SOLN: 5.0000 mg | Freq: Once | ORAL | Status: DC | PRN

## 2020-04-16 MED ORDER — LISINOPRIL-HYDROCHLOROTHIAZIDE 20-12.5 MG PO TABS: 1.0000 | ORAL_TABLET | Freq: Every day | ORAL | Status: DC

## 2020-04-16 MED ORDER — ENOXAPARIN (LOVENOX) PATIENT EDUCATION KIT
PACK | Freq: Once | Status: AC
  Filled 2020-04-16: qty 1

## 2020-04-16 MED ORDER — LIDOCAINE 2% (20 MG/ML) 5 ML SYRINGE
INTRAMUSCULAR | Status: AC
  Filled 2020-04-16: qty 5

## 2020-04-16 MED ORDER — DEXMEDETOMIDINE (PRECEDEX) IN NS 20 MCG/5ML (4 MCG/ML) IV SYRINGE
PREFILLED_SYRINGE | INTRAVENOUS | Status: DC | PRN
  Administered 2020-04-16: 4 ug via INTRAVENOUS
  Administered 2020-04-16: 8 ug via INTRAVENOUS
  Administered 2020-04-16 (×2): 4 ug via INTRAVENOUS

## 2020-04-16 MED ORDER — MIDAZOLAM HCL 5 MG/5ML IJ SOLN
INTRAMUSCULAR | Status: DC | PRN
  Administered 2020-04-16 (×2): 1 mg via INTRAVENOUS

## 2020-04-16 MED ORDER — LIDOCAINE 2% (20 MG/ML) 5 ML SYRINGE
INTRAMUSCULAR | Status: DC | PRN
  Administered 2020-04-16: 1.5 mg/kg/h via INTRAVENOUS

## 2020-04-16 MED ORDER — ONDANSETRON HCL 4 MG/2ML IJ SOLN
INTRAMUSCULAR | Status: DC | PRN
  Administered 2020-04-16: 4 mg via INTRAVENOUS

## 2020-04-16 MED ORDER — ROCURONIUM BROMIDE 10 MG/ML (PF) SYRINGE
PREFILLED_SYRINGE | INTRAVENOUS | Status: AC
  Filled 2020-04-16: qty 10

## 2020-04-16 MED ORDER — POTASSIUM CHLORIDE IN NACL 20-0.45 MEQ/L-% IV SOLN
INTRAVENOUS | Status: DC
  Administered 2020-04-17: 100 mL/h via INTRAVENOUS
  Filled 2020-04-16 (×6): qty 1000

## 2020-04-16 MED ORDER — HYDRALAZINE HCL 20 MG/ML IJ SOLN
10.0000 mg | INTRAMUSCULAR | Status: DC | PRN
  Administered 2020-04-16: 10 mg via INTRAVENOUS

## 2020-04-16 MED ORDER — HYDRALAZINE HCL 20 MG/ML IJ SOLN
INTRAMUSCULAR | Status: AC
  Filled 2020-04-16: qty 1

## 2020-04-16 MED ORDER — PROPOFOL 10 MG/ML IV BOLUS
INTRAVENOUS | Status: DC | PRN
  Administered 2020-04-16: 200 mg via INTRAVENOUS

## 2020-04-16 MED ORDER — SODIUM CHLORIDE 0.9 % IV SOLN
2.0000 g | INTRAVENOUS | Status: AC
  Administered 2020-04-16: 2 g via INTRAVENOUS
  Filled 2020-04-16: qty 2

## 2020-04-16 MED ORDER — OXYCODONE HCL 5 MG/5ML PO SOLN
5.0000 mg | Freq: Four times a day (QID) | ORAL | Status: DC | PRN
Start: 2020-04-16 — End: 2020-04-18
  Administered 2020-04-16 – 2020-04-18 (×5): 5 mg via ORAL
  Filled 2020-04-16 (×5): qty 5

## 2020-04-16 MED ORDER — DEXAMETHASONE SODIUM PHOSPHATE 10 MG/ML IJ SOLN
INTRAMUSCULAR | Status: DC | PRN
  Administered 2020-04-16: 5 mg via INTRAVENOUS

## 2020-04-16 MED ORDER — ROCURONIUM BROMIDE 10 MG/ML (PF) SYRINGE
PREFILLED_SYRINGE | INTRAVENOUS | Status: DC | PRN
  Administered 2020-04-16: 70 mg via INTRAVENOUS
  Administered 2020-04-16 (×2): 20 mg via INTRAVENOUS

## 2020-04-16 MED ORDER — SUGAMMADEX SODIUM 200 MG/2ML IV SOLN
INTRAVENOUS | Status: DC | PRN
  Administered 2020-04-16: 700 mg via INTRAVENOUS

## 2020-04-16 MED ORDER — ENSURE MAX PROTEIN PO LIQD
2.0000 [oz_av] | ORAL | Status: DC
  Administered 2020-04-17 – 2020-04-18 (×10): 2 [oz_av] via ORAL

## 2020-04-16 MED ORDER — PANTOPRAZOLE SODIUM 40 MG IV SOLR
40.0000 mg | Freq: Every day | INTRAVENOUS | Status: DC
  Administered 2020-04-16 – 2020-04-17 (×2): 40 mg via INTRAVENOUS
  Filled 2020-04-16 (×2): qty 40

## 2020-04-16 MED ORDER — DEXAMETHASONE SODIUM PHOSPHATE 4 MG/ML IJ SOLN: 4.0000 mg | INTRAMUSCULAR | Status: DC

## 2020-04-16 MED ORDER — PROMETHAZINE HCL 25 MG/ML IJ SOLN
INTRAMUSCULAR | Status: AC
  Filled 2020-04-16: qty 1

## 2020-04-16 MED ORDER — SODIUM CHLORIDE 0.9 % IV SOLN
12.5000 mg | Freq: Four times a day (QID) | INTRAVENOUS | Status: DC | PRN
  Filled 2020-04-16: qty 0.5

## 2020-04-16 MED ORDER — MORPHINE SULFATE (PF) 2 MG/ML IV SOLN
1.0000 mg | INTRAVENOUS | Status: DC | PRN
  Administered 2020-04-16: 2 mg via INTRAVENOUS
  Filled 2020-04-16: qty 1

## 2020-04-16 MED ORDER — DEXAMETHASONE SODIUM PHOSPHATE 10 MG/ML IJ SOLN
INTRAMUSCULAR | Status: AC
  Filled 2020-04-16: qty 1

## 2020-04-16 MED ORDER — MIDAZOLAM HCL 2 MG/2ML IJ SOLN
INTRAMUSCULAR | Status: AC
  Filled 2020-04-16: qty 2

## 2020-04-16 MED ORDER — GABAPENTIN 100 MG PO CAPS
200.0000 mg | ORAL_CAPSULE | Freq: Two times a day (BID) | ORAL | Status: DC
  Administered 2020-04-16 – 2020-04-18 (×4): 200 mg via ORAL
  Filled 2020-04-16 (×4): qty 2

## 2020-04-16 MED ORDER — LIDOCAINE 2% (20 MG/ML) 5 ML SYRINGE: INTRAMUSCULAR | Status: DC | PRN

## 2020-04-16 MED ORDER — ACETAMINOPHEN 500 MG PO TABS
1000.0000 mg | ORAL_TABLET | ORAL | Status: AC
  Administered 2020-04-16: 1000 mg via ORAL
  Filled 2020-04-16: qty 2

## 2020-04-16 MED ORDER — ENOXAPARIN SODIUM 40 MG/0.4ML ~~LOC~~ SOLN
40.0000 mg | SUBCUTANEOUS | Status: AC
  Administered 2020-04-16: 40 mg via SUBCUTANEOUS
  Filled 2020-04-16: qty 0.4

## 2020-04-16 MED ORDER — EPHEDRINE 5 MG/ML INJ
INTRAVENOUS | Status: AC
  Filled 2020-04-16: qty 10

## 2020-04-16 MED ORDER — FENTANYL CITRATE (PF) 250 MCG/5ML IJ SOLN
INTRAMUSCULAR | Status: AC
  Filled 2020-04-16: qty 5

## 2020-04-16 MED ORDER — LACTATED RINGERS IR SOLN
Status: DC | PRN
  Administered 2020-04-16: 3000 mL

## 2020-04-16 MED ORDER — PHENYLEPHRINE 40 MCG/ML (10ML) SYRINGE FOR IV PUSH (FOR BLOOD PRESSURE SUPPORT)
PREFILLED_SYRINGE | INTRAVENOUS | Status: DC | PRN
  Administered 2020-04-16: 120 ug via INTRAVENOUS
  Administered 2020-04-16: 80 ug via INTRAVENOUS

## 2020-04-16 MED ORDER — GABAPENTIN 300 MG PO CAPS
300.0000 mg | ORAL_CAPSULE | ORAL | Status: AC
  Administered 2020-04-16: 300 mg via ORAL
  Filled 2020-04-16: qty 1

## 2020-04-16 MED ORDER — STERILE WATER FOR IRRIGATION IR SOLN
Status: DC | PRN
  Administered 2020-04-16 (×2): 1000 mL

## 2020-04-16 MED ORDER — PROPOFOL 10 MG/ML IV BOLUS
INTRAVENOUS | Status: AC
  Filled 2020-04-16: qty 20

## 2020-04-16 MED ORDER — LIDOCAINE 2% (20 MG/ML) 5 ML SYRINGE
INTRAMUSCULAR | Status: DC | PRN
  Administered 2020-04-16: 100 mg via INTRAVENOUS

## 2020-04-16 MED ORDER — LABETALOL HCL 5 MG/ML IV SOLN
5.0000 mg | INTRAVENOUS | Status: DC | PRN
  Administered 2020-04-16 (×2): 5 mg via INTRAVENOUS

## 2020-04-16 MED ORDER — AMISULPRIDE (ANTIEMETIC) 5 MG/2ML IV SOLN: 10.0000 mg | Freq: Once | INTRAVENOUS | Status: DC | PRN

## 2020-04-16 MED ORDER — ORAL CARE MOUTH RINSE: 15.0000 mL | Freq: Once | OROMUCOSAL | Status: AC

## 2020-04-16 MED ORDER — LISINOPRIL 20 MG PO TABS
20.0000 mg | ORAL_TABLET | Freq: Every day | ORAL | Status: DC
  Administered 2020-04-17 – 2020-04-18 (×2): 20 mg via ORAL
  Filled 2020-04-16 (×2): qty 1

## 2020-04-16 MED ORDER — FENTANYL CITRATE (PF) 100 MCG/2ML IJ SOLN
25.0000 ug | INTRAMUSCULAR | Status: DC | PRN
Start: 2020-04-16 — End: 2020-04-16
  Administered 2020-04-16: 50 ug via INTRAVENOUS

## 2020-04-16 MED ORDER — ALBUTEROL SULFATE (2.5 MG/3ML) 0.083% IN NEBU
3.0000 mL | INHALATION_SOLUTION | Freq: Four times a day (QID) | RESPIRATORY_TRACT | Status: DC | PRN
  Administered 2020-04-18: 3 mL via RESPIRATORY_TRACT

## 2020-04-16 MED ORDER — INSULIN ASPART 100 UNIT/ML ~~LOC~~ SOLN
0.0000 [IU] | SUBCUTANEOUS | Status: DC
  Administered 2020-04-16 (×2): 7 [IU] via SUBCUTANEOUS
  Administered 2020-04-17 (×3): 4 [IU] via SUBCUTANEOUS
  Administered 2020-04-17 – 2020-04-18 (×5): 3 [IU] via SUBCUTANEOUS
  Administered 2020-04-18: 4 [IU] via SUBCUTANEOUS
  Administered 2020-04-18 (×2): 3 [IU] via SUBCUTANEOUS

## 2020-04-16 MED ORDER — LACTATED RINGERS IV SOLN: INTRAVENOUS | Status: DC

## 2020-04-16 MED ORDER — SODIUM CHLORIDE (PF) 0.9 % IJ SOLN
INTRAMUSCULAR | Status: DC | PRN
  Administered 2020-04-16: 50 mL via INTRAVENOUS

## 2020-04-16 MED ORDER — PHENYLEPHRINE 40 MCG/ML (10ML) SYRINGE FOR IV PUSH (FOR BLOOD PRESSURE SUPPORT)
PREFILLED_SYRINGE | INTRAVENOUS | Status: AC
  Filled 2020-04-16: qty 10

## 2020-04-16 MED ORDER — FENTANYL CITRATE (PF) 100 MCG/2ML IJ SOLN
INTRAMUSCULAR | Status: AC
  Filled 2020-04-16: qty 2

## 2020-04-16 MED ORDER — APREPITANT 40 MG PO CAPS
40.0000 mg | ORAL_CAPSULE | ORAL | Status: AC
  Administered 2020-04-16: 40 mg via ORAL
  Filled 2020-04-16: qty 1

## 2020-04-16 MED ORDER — ONDANSETRON HCL 4 MG/2ML IJ SOLN
4.0000 mg | Freq: Four times a day (QID) | INTRAMUSCULAR | Status: DC | PRN
  Administered 2020-04-16 – 2020-04-17 (×2): 4 mg via INTRAVENOUS
  Filled 2020-04-16 (×2): qty 2

## 2020-04-16 MED ORDER — SCOPOLAMINE 1 MG/3DAYS TD PT72
1.0000 | MEDICATED_PATCH | TRANSDERMAL | Status: DC
  Administered 2020-04-16: 1.5 mg via TRANSDERMAL
  Filled 2020-04-16: qty 1

## 2020-04-16 MED ORDER — CHLORHEXIDINE GLUCONATE 4 % EX LIQD: 60.0000 mL | Freq: Once | CUTANEOUS | Status: DC

## 2020-04-16 MED ORDER — ACETAMINOPHEN 500 MG PO TABS
1000.0000 mg | ORAL_TABLET | Freq: Three times a day (TID) | ORAL | Status: DC
  Administered 2020-04-16 – 2020-04-18 (×5): 1000 mg via ORAL
  Filled 2020-04-16 (×5): qty 2

## 2020-04-16 MED ORDER — SIMETHICONE 80 MG PO CHEW: 80.0000 mg | CHEWABLE_TABLET | Freq: Four times a day (QID) | ORAL | Status: DC | PRN

## 2020-04-16 MED ORDER — BUPIVACAINE LIPOSOME 1.3 % IJ SUSP
INTRAMUSCULAR | Status: DC | PRN
  Administered 2020-04-16: 20 mL

## 2020-04-16 MED ORDER — SUCCINYLCHOLINE CHLORIDE 200 MG/10ML IV SOSY
PREFILLED_SYRINGE | INTRAVENOUS | Status: DC | PRN
  Administered 2020-04-16: 200 mg via INTRAVENOUS

## 2020-04-16 MED ORDER — ONDANSETRON HCL 4 MG/2ML IJ SOLN
INTRAMUSCULAR | Status: AC
  Filled 2020-04-16: qty 2

## 2020-04-16 MED ORDER — METOPROLOL SUCCINATE ER 25 MG PO TB24
25.0000 mg | ORAL_TABLET | Freq: Every day | ORAL | Status: DC
  Administered 2020-04-16 – 2020-04-18 (×3): 25 mg via ORAL
  Filled 2020-04-16 (×3): qty 1

## 2020-04-16 MED ORDER — KETOROLAC TROMETHAMINE 30 MG/ML IJ SOLN
INTRAMUSCULAR | Status: AC
  Filled 2020-04-16: qty 1

## 2020-04-16 MED ORDER — KETOROLAC TROMETHAMINE 30 MG/ML IJ SOLN
30.0000 mg | Freq: Once | INTRAMUSCULAR | Status: AC | PRN
  Administered 2020-04-16: 30 mg via INTRAVENOUS

## 2020-04-16 MED ORDER — FENTANYL CITRATE (PF) 100 MCG/2ML IJ SOLN
INTRAMUSCULAR | Status: DC | PRN
  Administered 2020-04-16 (×3): 50 ug via INTRAVENOUS

## 2020-04-16 MED ORDER — LABETALOL HCL 5 MG/ML IV SOLN
INTRAVENOUS | Status: AC
  Filled 2020-04-16: qty 4

## 2020-04-16 MED ORDER — OXYCODONE HCL 5 MG PO TABS: 5.0000 mg | ORAL_TABLET | Freq: Once | ORAL | Status: DC | PRN

## 2020-04-16 MED ORDER — ACETAMINOPHEN 160 MG/5ML PO SOLN
1000.0000 mg | Freq: Three times a day (TID) | ORAL | Status: DC
  Filled 2020-04-16: qty 40.6

## 2020-04-16 MED ORDER — ENOXAPARIN SODIUM 30 MG/0.3ML ~~LOC~~ SOLN
30.0000 mg | Freq: Two times a day (BID) | SUBCUTANEOUS | Status: DC
  Administered 2020-04-16 – 2020-04-18 (×4): 30 mg via SUBCUTANEOUS
  Filled 2020-04-16 (×4): qty 0.3

## 2020-04-16 MED ORDER — HYDROCHLOROTHIAZIDE 12.5 MG PO CAPS
12.5000 mg | ORAL_CAPSULE | Freq: Every day | ORAL | Status: DC
  Administered 2020-04-17 – 2020-04-18 (×2): 12.5 mg via ORAL
  Filled 2020-04-16 (×2): qty 1

## 2020-04-16 MED ORDER — PHENYLEPHRINE HCL-NACL 10-0.9 MG/250ML-% IV SOLN
INTRAVENOUS | Status: DC | PRN
  Administered 2020-04-16: 30 ug/min via INTRAVENOUS

## 2020-04-16 MED ORDER — HYDROXYZINE HCL 25 MG PO TABS: 25.0000 mg | ORAL_TABLET | Freq: Three times a day (TID) | ORAL | Status: DC | PRN

## 2020-04-16 MED ORDER — CHLORHEXIDINE GLUCONATE 0.12 % MT SOLN
15.0000 mL | Freq: Once | OROMUCOSAL | Status: AC
  Administered 2020-04-16: 15 mL via OROMUCOSAL

## 2020-04-16 MED ORDER — PROMETHAZINE HCL 25 MG/ML IJ SOLN
6.2500 mg | INTRAMUSCULAR | Status: DC | PRN
Start: 2020-04-16 — End: 2020-04-16
  Administered 2020-04-16: 12.5 mg via INTRAVENOUS

## 2020-04-16 MED ORDER — GLYCOPYRROLATE PF 0.2 MG/ML IJ SOSY
PREFILLED_SYRINGE | INTRAMUSCULAR | Status: DC | PRN
  Administered 2020-04-16: .1 mg via INTRAVENOUS

## 2020-04-16 SURGICAL SUPPLY — 89 items
APL PRP STRL LF DISP 70% ISPRP (MISCELLANEOUS) ×2
APL SKNCLS STERI-STRIP NONHPOA (GAUZE/BANDAGES/DRESSINGS)
APL SRG 32X5 SNPLK LF DISP (MISCELLANEOUS)
APL SWBSTK 6 STRL LF DISP (MISCELLANEOUS)
APPLICATOR COTTON TIP 6 STRL (MISCELLANEOUS) IMPLANT
APPLICATOR COTTON TIP 6IN STRL (MISCELLANEOUS)
APPLIER CLIP ROT 10 11.4 M/L (STAPLE)
APPLIER CLIP ROT 13.4 12 LRG (CLIP)
APR CLP LRG 13.4X12 ROT 20 MLT (CLIP)
APR CLP MED LRG 11.4X10 (STAPLE)
BENZOIN TINCTURE PRP APPL 2/3 (GAUZE/BANDAGES/DRESSINGS) ×1 IMPLANT
BLADE SURG SZ11 CARB STEEL (BLADE) ×2 IMPLANT
BNDG ADH 1X3 SHEER STRL LF (GAUZE/BANDAGES/DRESSINGS) ×12 IMPLANT
BNDG ADH THN 3X1 STRL LF (GAUZE/BANDAGES/DRESSINGS) ×6
CABLE HIGH FREQUENCY MONO STRZ (ELECTRODE) ×2 IMPLANT
CHLORAPREP W/TINT 26 (MISCELLANEOUS) ×4 IMPLANT
CLIP APPLIE ROT 10 11.4 M/L (STAPLE) IMPLANT
CLIP APPLIE ROT 13.4 12 LRG (CLIP) IMPLANT
CLSR STERI-STRIP ANTIMIC 1/2X4 (GAUZE/BANDAGES/DRESSINGS) ×1 IMPLANT
COVER SURGICAL LIGHT HANDLE (MISCELLANEOUS) ×2 IMPLANT
COVER WAND RF STERILE (DRAPES) IMPLANT
DECANTER SPIKE VIAL GLASS SM (MISCELLANEOUS) ×2 IMPLANT
DEVICE SUT QUICK LOAD TK 5 (STAPLE) IMPLANT
DEVICE SUT TI-KNOT TK 5X26 (MISCELLANEOUS) IMPLANT
DEVICE SUTURE ENDOST 10MM (ENDOMECHANICALS) IMPLANT
DISSECTOR BLUNT TIP ENDO 5MM (MISCELLANEOUS) IMPLANT
DRAPE UTILITY XL STRL (DRAPES) ×4 IMPLANT
DRSG TEGADERM 2-3/8X2-3/4 SM (GAUZE/BANDAGES/DRESSINGS) ×1 IMPLANT
ELECT L-HOOK LAP 45CM DISP (ELECTROSURGICAL)
ELECT REM PT RETURN 15FT ADLT (MISCELLANEOUS) ×2 IMPLANT
ELECTRODE L-HOOK LAP 45CM DISP (ELECTROSURGICAL) IMPLANT
GAUZE SPONGE 2X2 8PLY STRL LF (GAUZE/BANDAGES/DRESSINGS) IMPLANT
GAUZE SPONGE 4X4 12PLY STRL (GAUZE/BANDAGES/DRESSINGS) IMPLANT
GLOVE SURG ENC MOIS LTX SZ7.5 (GLOVE) ×2 IMPLANT
GLOVE SURG UNDER LTX SZ6.5 (GLOVE) ×1 IMPLANT
GLOVE SURG UNDER LTX SZ8 (GLOVE) ×2 IMPLANT
GOWN STRL REUS W/TWL XL LVL3 (GOWN DISPOSABLE) ×6 IMPLANT
GRASPER SUT TROCAR 14GX15 (MISCELLANEOUS) ×2 IMPLANT
HANDLE STAPLE EGIA 4 XL (STAPLE) ×1 IMPLANT
KIT BASIN OR (CUSTOM PROCEDURE TRAY) ×2 IMPLANT
KIT TURNOVER KIT A (KITS) ×2 IMPLANT
MARKER SKIN DUAL TIP RULER LAB (MISCELLANEOUS) ×2 IMPLANT
MAT PREVALON FULL STRYKER (MISCELLANEOUS) ×2 IMPLANT
NDL SPNL 22GX3.5 QUINCKE BK (NEEDLE) ×1 IMPLANT
NEEDLE SPNL 22GX3.5 QUINCKE BK (NEEDLE) ×2 IMPLANT
PACK UNIVERSAL I (CUSTOM PROCEDURE TRAY) ×2 IMPLANT
PENCIL SMOKE EVACUATOR (MISCELLANEOUS) IMPLANT
RELOAD STAPLE 60 3.6 BLU REG (STAPLE) ×1 IMPLANT
RELOAD STAPLE 60 3.8 GOLD REG (STAPLE) IMPLANT
RELOAD STAPLE 60 4.1 GRN THCK (STAPLE) ×1 IMPLANT
RELOAD STAPLE 60 BLK VRY/THCK (STAPLE) IMPLANT
RELOAD STAPLER 60MM BLK (STAPLE) IMPLANT
RELOAD STAPLER BLUE 60MM (STAPLE) IMPLANT
RELOAD STAPLER GOLD 60MM (STAPLE) IMPLANT
RELOAD STAPLER GREEN 60MM (STAPLE) IMPLANT
RELOAD TRI 45 ART MED THCK BLK (STAPLE) ×1 IMPLANT
RELOAD TRI 60 ART MED THCK PUR (STAPLE) ×4 IMPLANT
SCISSORS LAP 5X45 EPIX DISP (ENDOMECHANICALS) IMPLANT
SEALANT SURGICAL APPL DUAL CAN (MISCELLANEOUS) IMPLANT
SET IRRIG TUBING LAPAROSCOPIC (IRRIGATION / IRRIGATOR) ×2 IMPLANT
SET TUBE SMOKE EVAC HIGH FLOW (TUBING) ×2 IMPLANT
SHEARS HARMONIC ACE PLUS 45CM (MISCELLANEOUS) ×2 IMPLANT
SLEEVE GASTRECTOMY 40FR VISIGI (MISCELLANEOUS) ×2 IMPLANT
SLEEVE XCEL OPT CAN 5 100 (ENDOMECHANICALS) ×6 IMPLANT
SOL ANTI FOG 6CC (MISCELLANEOUS) ×1 IMPLANT
SOLUTION ANTI FOG 6CC (MISCELLANEOUS) ×1
SPONGE GAUZE 2X2 STER 10/PKG (GAUZE/BANDAGES/DRESSINGS) ×1
SPONGE LAP 18X18 RF (DISPOSABLE) ×2 IMPLANT
STAPLER ECHELON BIOABSB 60 FLE (MISCELLANEOUS) ×8 IMPLANT
STAPLER ECHELON LONG 60 440 (INSTRUMENTS) ×1 IMPLANT
STAPLER RELOAD 60MM BLK (STAPLE)
STAPLER RELOAD BLUE 60MM (STAPLE)
STAPLER RELOAD GOLD 60MM (STAPLE)
STAPLER RELOAD GREEN 60MM (STAPLE)
STRIP CLOSURE SKIN 1/2X4 (GAUZE/BANDAGES/DRESSINGS) ×2 IMPLANT
SUT MNCRL AB 4-0 PS2 18 (SUTURE) ×2 IMPLANT
SUT SURGIDAC NAB ES-9 0 48 120 (SUTURE) IMPLANT
SUT VICRYL 0 TIES 12 18 (SUTURE) ×2 IMPLANT
SUT VICRYL 0 UR6 27IN ABS (SUTURE) ×1 IMPLANT
SYR 20ML LL LF (SYRINGE) ×2 IMPLANT
SYR 50ML LL SCALE MARK (SYRINGE) ×2 IMPLANT
TOWEL OR 17X26 10 PK STRL BLUE (TOWEL DISPOSABLE) ×2 IMPLANT
TOWEL OR NON WOVEN STRL DISP B (DISPOSABLE) ×2 IMPLANT
TROCAR 12M 150ML BLUNT (TROCAR) ×1 IMPLANT
TROCAR BLADELESS 15MM (ENDOMECHANICALS) ×2 IMPLANT
TROCAR BLADELESS OPT 5 100 (ENDOMECHANICALS) ×2 IMPLANT
TROCAR BLADELESS OPT 5 150 (ENDOMECHANICALS) ×2 IMPLANT
TUBING CONNECTING 10 (TUBING) ×4 IMPLANT
TUBING ENDO SMARTCAP (MISCELLANEOUS) ×2 IMPLANT

## 2020-04-16 NOTE — Anesthesia Procedure Notes (Addendum)
Procedure Name: Intubation Date/Time: 04/16/2020 11:41 AM Performed by: Wynonia Sours, CRNA Pre-anesthesia Checklist: Patient identified, Emergency Drugs available, Suction available, Patient being monitored and Timeout performed Patient Re-evaluated:Patient Re-evaluated prior to induction Oxygen Delivery Method: Circle system utilized Preoxygenation: Pre-oxygenation with 100% oxygen Induction Type: IV induction, Rapid sequence and Cricoid Pressure applied Laryngoscope Size: Glidescope and 3 Grade View: Grade I Tube type: Oral Tube size: 7.5 mm Number of attempts: 1 Airway Equipment and Method: Stylet Placement Confirmation: ETT inserted through vocal cords under direct vision,  positive ETCO2,  CO2 detector and breath sounds checked- equal and bilateral Secured at: 21 cm Tube secured with: Tape Dental Injury: Teeth and Oropharynx as per pre-operative assessment  Difficulty Due To: Difficulty was anticipated, Difficult Airway- due to limited oral opening and Difficult Airway- due to large tongue Future Recommendations: Recommend- induction with short-acting agent, and alternative techniques readily available Comments: AOI x1 attempt. Patient ramped with ramp pillow.

## 2020-04-16 NOTE — Progress Notes (Addendum)
PHARMACY CONSULT FOR:  Risk Assessment for Post-Discharge VTE Following Bariatric Surgery  Post-Discharge VTE Risk Assessment: This patient's probability of 30-day post-discharge VTE is increased due to the factors marked:   Female    Age >/=60 years  X  BMI >/=50 kg/m2    CHF    Dyspnea at Rest    Paraplegia  X  Non-gastric-band surgery    Operation Time >/=3 hr    Return to OR     Length of Stay >/= 3 d   Hx of VTE   Hypercoagulable condition   Significant venous stasis       Predicted probability of 30-day post-discharge VTE: 0.27%  Other patient-specific factors to consider:   Recommendation for Discharge: No pharmacologic prophylaxis post-discharge      Laura Benson is a 30 y.o. female who underwent laparoscopic sleeve gastrectomy on 04/16/20   Case start: 1158 Case end: 1349   No Known Allergies  Patient Measurements: Height: 5\' 6"  (167.6 cm) Weight: (!) 211.8 kg (467 lb) IBW/kg (Calculated) : 59.3 Body mass index is 75.38 kg/m.  Recent Labs    04/16/20 1440  HGB 13.5  HCT 43.4   Estimated Creatinine Clearance: 186 mL/min (by C-G formula based on SCr of 0.84 mg/dL).    Past Medical History:  Diagnosis Date  . Anxiety   . Asthma   . Complication of anesthesia    Take for a while to wake up  . Depression   . Diabetes mellitus without complication (HCC)   . Hypertension   . Morbid obesity (HCC)   . Pneumonia   . Sleep apnea    CPAP  . Tachycardia      Medications Prior to Admission  Medication Sig Dispense Refill Last Dose  . acetaminophen (TYLENOL) 500 MG tablet Take 1,000 mg by mouth 2 (two) times daily as needed (pain).   Past Month at Unknown time  . albuterol (VENTOLIN HFA) 108 (90 Base) MCG/ACT inhaler Inhale 2 puffs into the lungs every 6 (six) hours as needed for wheezing or shortness of breath. 1 each 2 Past Month at Unknown time  . escitalopram (LEXAPRO) 20 MG tablet TAKE 1 TABLET BY MOUTH EVERY DAY 90 tablet 1 04/15/2020 at  Unknown time  . glyBURIDE (DIABETA) 5 MG tablet TAKE 1 TABLET (5 MG TOTAL) BY MOUTH 2 (TWO) TIMES DAILY WITH A MEAL. 180 tablet 0 04/15/2020 at Unknown time  . hydrOXYzine (ATARAX/VISTARIL) 25 MG tablet TAKE 1 TABLET (25 MG TOTAL) BY MOUTH 3 (THREE) TIMES DAILY AS NEEDED FOR ANXIETY. 90 tablet 2 04/15/2020 at Unknown time  . insulin glargine, 2 Unit Dial, (TOUJEO MAX SOLOSTAR) 300 UNIT/ML Solostar Pen Inject 16 Units into the skin at bedtime. 15 mL 1 04/15/2020 at Unknown time  . Insulin Pen Needle 32G X 4 MM MISC To use with toujeo 50 each 1 04/15/2020 at Unknown time  . Lancets (ONETOUCH DELICA PLUS LANCET33G) MISC USE DAILY TO CHECK BLOOD SUGAR 100 each 12 04/15/2020 at Unknown time  . lisinopril-hydrochlorothiazide (ZESTORETIC) 20-12.5 MG tablet Take 1 tablet by mouth daily. 90 tablet 2 04/15/2020 at Unknown time  . meloxicam (MOBIC) 15 MG tablet Take 15 mg by mouth daily as needed (back pain).   04/15/2020 at Unknown time  . metFORMIN (GLUCOPHAGE XR) 500 MG 24 hr tablet Take 2 tablets (1,000 mg total) by mouth in the morning and at bedtime. 120 tablet 5 04/15/2020 at Unknown time  . metoprolol succinate (TOPROL-XL) 25 MG 24 hr tablet TAKE  1 TABLET (25 MG TOTAL) BY MOUTH DAILY. FOR HIGH BLOOD PRESSURE 30 tablet 8 04/15/2020 at Unknown time  . ondansetron (ZOFRAN-ODT) 4 MG disintegrating tablet Take 1 tablet (4 mg total) by mouth every 8 (eight) hours as needed for nausea or vomiting. 20 tablet 0 04/15/2020 at Unknown time  . ONETOUCH VERIO test strip USE AS DAILY TO CHECK BLOOD SUGAR. 100 strip 12 04/15/2020 at Unknown time       Valentina Gu 04/16/2020,5:28 PM

## 2020-04-16 NOTE — Anesthesia Postprocedure Evaluation (Signed)
Anesthesia Post Note  Patient: Laura Benson  Procedure(s) Performed: LAPAROSCOPIC GASTRIC SLEEVE RESECTION (N/A ) UPPER GI ENDOSCOPY (N/A )     Patient location during evaluation: PACU Anesthesia Type: General Level of consciousness: awake Pain management: pain level controlled Vital Signs Assessment: post-procedure vital signs reviewed and stable Respiratory status: spontaneous breathing, nonlabored ventilation, respiratory function stable and patient connected to nasal cannula oxygen Cardiovascular status: blood pressure returned to baseline and stable Postop Assessment: no apparent nausea or vomiting Anesthetic complications: no   No complications documented.  Last Vitals:  Vitals:   04/16/20 1630 04/16/20 1700  BP: (!) 157/102 (!) 157/121  Pulse: 99   Resp: 12 18  Temp: 36.8 C 37 C  SpO2: 100% 100%    Last Pain:  Vitals:   04/16/20 1700  TempSrc: Oral  PainSc:                  Annistyn Depass P Erma Raiche

## 2020-04-16 NOTE — Op Note (Signed)
   Patient: Laura Benson ([birth year], [id number])  Date of Surgery: 04/16/2020   Preoperative Diagnosis: MORBID OBESITY   Postoperative Diagnosis: MORBID OBESITY   Surgical Procedure: Upper Endoscopy   Surgeon: Ivar Drape, MD  Anesthesiologist: Leonides Grills, MD CRNA: Ponciano Ort, CRNA; Wynonia Sours, CRNA   Anesthesia: General   Fluids:  Total I/O In: 100 [IV Piggyback:100] Out: -   Complications: None  Drains:  None  Specimen: None   Indications for Procedure: [name] is a 30 y.o. female undergoing lap sleeve gastrectomy and an EGD was requested to evaluate foregut anatomy intraoperatively.  Description of Procedure: During the procedure, I scrubbed out and obtained the Olympus endoscope. I gently placed endoscope in the patient's oropharynx and gently glided it down the esophagus without any difficulty under direct visualization.  The scope was advanced as far as the duodenum and then slowly withdrawn to inspect the foregut anatomy.  Dr. Andrey Campanile had placed saline in the upper abdomen and all staple lines were submerged to ensure no air leak. There was no evidence of bubbles. There was no evidence of intraluminal bleeding and the mucosa appeared healthy.  The lumen was widely patent without evidence of stricture.  The GE junction was located at 38 cm.  The intraluminal insufflation was decompressed. The scope was withdrawn. The patient tolerated this portion of the procedure well. Please see Dr Tawana Scale operative note for details regarding the remainder of the procedure.    Ivar Drape, MD General, Bariatric, & Minimally Invasive Surgery [facility], [place]

## 2020-04-16 NOTE — Progress Notes (Signed)
Patient is ambulating, voiding , is using her incentive spirometer, and her vitals are stable. Patient started drinking first 2oz cup of water at 1730.

## 2020-04-16 NOTE — Anesthesia Preprocedure Evaluation (Addendum)
Anesthesia Evaluation  Patient identified by MRN, date of birth, ID band Patient awake    Reviewed: Allergy & Precautions, NPO status , Patient's Chart, lab work & pertinent test results  Airway Mallampati: II  TM Distance: >3 FB Neck ROM: Full    Dental no notable dental hx.    Pulmonary asthma , sleep apnea and Continuous Positive Airway Pressure Ventilation , former smoker,    Pulmonary exam normal breath sounds clear to auscultation       Cardiovascular hypertension, Pt. on medications and Pt. on home beta blockers  Rhythm:Regular Rate:Tachycardia  ECG: SR, rate 96   Neuro/Psych PSYCHIATRIC DISORDERS Anxiety Depression negative neurological ROS     GI/Hepatic negative GI ROS, Neg liver ROS,   Endo/Other  diabetes, Insulin Dependent, Oral Hypoglycemic AgentsMorbid obesity (SUPER)  Renal/GU negative Renal ROS     Musculoskeletal negative musculoskeletal ROS (+)   Abdominal   Peds  Hematology negative hematology ROS (+)   Anesthesia Other Findings MORBID OBESITY  Reproductive/Obstetrics                            Anesthesia Physical Anesthesia Plan  ASA: IV  Anesthesia Plan: General   Post-op Pain Management:    Induction: Intravenous  PONV Risk Score and Plan: 3 and Ondansetron, Dexamethasone, Midazolam and Treatment may vary due to age or medical condition  Airway Management Planned: Oral ETT and Video Laryngoscope Planned  Additional Equipment:   Intra-op Plan:   Post-operative Plan: Extubation in OR  Informed Consent: I have reviewed the patients History and Physical, chart, labs and discussed the procedure including the risks, benefits and alternatives for the proposed anesthesia with the patient or authorized representative who has indicated his/her understanding and acceptance.     Dental advisory given  Plan Discussed with: CRNA  Anesthesia Plan Comments:         Anesthesia Quick Evaluation

## 2020-04-16 NOTE — Progress Notes (Signed)
Report given to [facility] informed of center dressing pt's abdomen has packing in it-she verbalized understanding of report given.

## 2020-04-16 NOTE — Transfer of Care (Signed)
Immediate Anesthesia Transfer of Care Note  Patient: Laura Benson  Procedure(s) Performed: LAPAROSCOPIC GASTRIC SLEEVE RESECTION (N/A ) UPPER GI ENDOSCOPY (N/A )  Patient Location: PACU  Anesthesia Type:General  Level of Consciousness: awake, drowsy and patient cooperative  Airway & Oxygen Therapy: Patient Spontanous Breathing and Patient connected to nasal cannula oxygen  Post-op Assessment: Report given to RN and Post -op Vital signs reviewed and stable  Post vital signs: Reviewed and stable  Last Vitals:  Vitals Value Taken Time  BP 149/90 04/16/20 1406  Temp    Pulse 100 04/16/20 1410  Resp 9 04/16/20 1410  SpO2 100 % 04/16/20 1410  Vitals shown include unvalidated device data.  Last Pain:  Vitals:   04/16/20 0931  TempSrc: Oral         Complications: No complications documented.

## 2020-04-16 NOTE — Interval H&P Note (Signed)
History and Physical Interval Note:  04/16/2020 11:07 AM  Laura Benson  has presented today for surgery, with the diagnosis of MORBID OBESITY.  The various methods of treatment have been discussed with the patient and family. After consideration of risks, benefits and other options for treatment, the patient has consented to  Procedure(s): LAPAROSCOPIC GASTRIC SLEEVE RESECTION (N/A) UPPER GI ENDOSCOPY (N/A) as a surgical intervention.  The patient's history has been reviewed, patient examined, no change in status, stable for surgery.  I have reviewed the patient's chart and labs.  Questions were answered to the patient's satisfaction.    Mary Sella. Andrey Campanile, MD, FACS General, Bariatric, & Minimally Invasive Surgery [facility] [place]  [name]

## 2020-04-16 NOTE — Progress Notes (Signed)
Discussed post op day goals with patient including ambulation, IS, diet progression, pain, and nausea control.  BSTOP education provided including BSTOP information guide, "Guide for Pain Management after your Bariatric Procedure".  Questions answered. 

## 2020-04-16 NOTE — Op Note (Signed)
04/16/2020 Vivien Rossetti 02-27-1990 [id number]   PRE-OPERATIVE DIAGNOSIS:     Severe obesity BMI 75   Diabetes mellitus without complication (HCC)   Hypertension   Obstructive sleep apnea   Uncomplicated asthma  POST-OPERATIVE DIAGNOSIS:  same  PROCEDURE:  Procedure(s): LAPAROSCOPIC SLEEVE GASTRECTOMY  REPAIR UPPER GI ENDOSCOPY  SURGEON:  Surgeon(s): Atilano Ina, MD FACS FASMBS  ASSISTANTS: Ivar Drape MD; Hedda Slade PA-C  ANESTHESIA:   general  DRAINS: none   BOUGIE: 40 fr ViSiGi  LOCAL MEDICATIONS USED:   Exparel  EBL: minimal  SPECIMEN:  Source of Specimen:  Greater curvature of stomach  DISPOSITION OF SPECIMEN:  PATHOLOGY  COUNTS:  YES  INDICATION FOR PROCEDURE: This is a very pleasant 30 y.o.-year-old morbidly obese female who has had unsuccessful attempts for sustained weight loss. The patient presents today for a planned laparoscopic sleeve gastrectomy with upper endoscopy. We have discussed the risk and benefits of the procedure extensively preoperatively. Please see my separate notes.  PROCEDURE: After obtaining informed consent and receiving 5000 units of subcutaneous heparin, the patient was brought to the operating room at [facility] and placed supine on the operating room table. General endotracheal anesthesia was established. Sequential compression devices were placed. A orogastric tube was placed. The patient's abdomen was prepped and draped in the usual standard surgical fashion. The patient received preoperative IV antibiotic. A surgical timeout was performed. ERAS protocol used.   Access to the abdomen was achieved using a 5 mm 0 laparoscope thru a 5 mm trocar In the left upper Quadrant 2 fingerbreadths below the left subcostal margin using the Optiview technique. Pneumoperitoneum was smoothly established up to 15 mm of mercury. The laparoscope was advanced and the abdominal cavity was surveilled. The patient was then placed in reverse  Trendelenburg.  She had an umbilical hernia without anything trapped within it.  The patient had an extremely thick abdominal wall.  A 5 mm XL trocar was placed a few inches above and to the left of the umbilicus under direct visualization.  The [facility] liver retractor was placed under the left lobe of the liver through a 5 mm trocar incision site in the subxiphoid position. A 5 mm XL trocar was placed in the lateral right upper quadrant along with a 15 mm trocar in the mid right abdomen. A final 5 mm trocar was placed in the lateral LUQ.  All under direct visualization after exparel had been infiltrated in bilateral lateral upper abdominal walls as a TAP block.  Did take some time to place her trochars due to her abdominal wall thickness and the torque.  The stomach was inspected. It was completely decompressed and the orogastric tube was removed.  There was no  anterior dimple that was obviously visible.  Her preoperative upper GI showed no evidence of a hiatal hernia.   I first inspected the upper abdomen and the stomach to ensure that we could proceed safely given the extreme tortuosity in her abdominal wall.  The 5 mm laparoscope actually cracked due to the torque needed to visualize the upper abdomen.  Therefore we exchanged the 5 mm trocar in the left midabdomen for a 12 mm trocar and used an extended length 10 mm angled laparoscope.   We identified the pylorus and measured 6 cm proximal to the pylorus and identified an area of where we would start taking down the short gastric vessels. Harmonic scalpel was used to take down the short gastric vessels along the greater curvature of  the stomach.  Because of the torquing the abdominal wall I had to do this through the optical entry site in the left upper quadrant as opposed to the 15 mm trocar in the right midabdomen.  My assistant ended up placing an additional 5 Miller trocar in the left lower mid abdomen to facilitate retraction.  We were able to  enter the lesser sac. We continued to march along the greater curvature of the stomach taking down the short gastrics. As we approached the gastrosplenic ligament we took care in this area not to injure the spleen. We were able to take down the entire gastrosplenic ligament. We then mobilized the fundus away from the left crus of diaphragm. There were not any significant posterior gastric avascular attachments. This left the stomach completely mobilized. No vessels had been taken down along the lesser curvature of the stomach.  We then reidentified the pylorus. A 40Fr ViSiGi was then placed in the oropharynx and advanced down into the stomach and placed in the distal antrum and positioned along the lesser curvature. It was placed under suction which secured the 40Fr ViSiGi in place along the lesser curve.  Because of the torque in her abdominal wall especially through the 15 mm trocar in the right midabdomen I decided that a Covidien endomechanical laparoscopic stapler would be easier to use for this case.   Then using the Covidien stapler with a 45 mm TRS black load with reinforcement  I placed a stapler along the antrum approximately 6cm from the pylorus. The stapler was angled so that there is ample room at the angularis incisura. I then fired the first staple load after inspecting it posteriorly to ensure adequate space both anteriorly and posteriorly.  At this point I started using 60 mm TRS purple load staple cartridges with reinforcement. The stapler was then repositioned with a 60 mm TRS purple load staple cartridge with reinforcement and we continued to march up along the ViSiGi. My assistant was holding traction along the greater curvature stomach along the cauterized short gastric vessels ensuring that the stomach was symmetrically retracted. Prior to each firing of the staple, we rotated the stomach to ensure that there is adequate stomach left.  As we approached the fundus, I used 60 mm TRS purple  load staple cartridges with reinforcement staying a little  lateral to the GE junction after mobilizing some of the esophageal fat pad.  The sleeve was inspected. There is no evidence of cork screw. The staple line appeared hemostatic. The CRNA inflated the ViSiGi to the green zone and the upper abdomen was flooded with saline. There were no bubbles. The sleeve was decompressed and the ViSiGi removed.   The greater curvature the stomach was grasped with a laparoscopic grasper and removed from the 15 mm trocar site.  It was a little bit challenging getting the stomach out through her abdominal wall.  The stomach did tear in the soft tissue.  There is no obvious spillage of gastric contents within soft tissue  My assistant scrubbed out and performed an upper endoscopy. The sleeve easily distended with air and the scope was easily advanced to the pylorus. There is no evidence of internal bleeding or cork screwing. There was no narrowing at the angularis. There is no evidence of bubbles. Please see his operative note for further details. The gastric sleeve was decompressed and the endoscope was removed.   The liver retractor was removed. I then closed the 15 mm trocar site with 1 interrupted  0 Vicryl sutures through the fascia using the endoclose. The closure was viewed laparoscopically and it was airtight. Remaining Exparel was then infiltrated in the preperitoneal spaces around the trocar sites. Pneumoperitoneum was released. All trocar sites were closed with a 4-0 Monocryl in a subcuticular fashion followed by the application of  steri-strips, and bandaids.  I did partially closed the extraction site with 4-0 Monocryl 1 from each end and left it open in the center and placed 1/2 inch packing strip down into the wound.  Prior to closing this trocar site I irrigated it copiously with saline.  the patient was extubated and taken to the recovery room in stable condition. All needle, instrument, and sponge counts were  correct x2. There are no immediate complications  (1) 45 mm black TRS with  reinforcement (4) 60 mm purple TRS with reinforcement  PLAN OF CARE: Admit to inpatient   PATIENT DISPOSITION:  PACU - hemodynamically stable.   Delay start of Pharmacological VTE agent (>24hrs) due to surgical blood loss or risk of bleeding:  no  Mary Sella. Andrey Campanile, MD, FACS FASMBS General, Bariatric, & Minimally Invasive Surgery [facility], [place]

## 2020-04-17 ENCOUNTER — Encounter (HOSPITAL_COMMUNITY): Payer: Self-pay | Admitting: General Surgery

## 2020-04-17 LAB — CBC WITH DIFFERENTIAL/PLATELET
Abs Immature Granulocytes: 0.03 10*3/uL (ref 0.00–0.07)
Basophils Absolute: 0 10*3/uL (ref 0.0–0.1)
Basophils Relative: 0 %
Eosinophils Absolute: 0 10*3/uL (ref 0.0–0.5)
Eosinophils Relative: 0 %
HCT: 41 % (ref 36.0–46.0)
Hemoglobin: 12.8 g/dL (ref 12.0–15.0)
Immature Granulocytes: 0 %
Lymphocytes Relative: 13 %
Lymphs Abs: 1.1 10*3/uL (ref 0.7–4.0)
MCH: 24.2 pg — ABNORMAL LOW (ref 26.0–34.0)
MCHC: 31.2 g/dL (ref 30.0–36.0)
MCV: 77.5 fL — ABNORMAL LOW (ref 80.0–100.0)
Monocytes Absolute: 1 10*3/uL (ref 0.1–1.0)
Monocytes Relative: 12 %
Neutro Abs: 6.3 10*3/uL (ref 1.7–7.7)
Neutrophils Relative %: 75 %
Platelets: 274 10*3/uL (ref 150–400)
RBC: 5.29 MIL/uL — ABNORMAL HIGH (ref 3.87–5.11)
RDW: 14.6 % (ref 11.5–15.5)
WBC: 8.4 10*3/uL (ref 4.0–10.5)
nRBC: 0 % (ref 0.0–0.2)

## 2020-04-17 LAB — COMPREHENSIVE METABOLIC PANEL
ALT: 125 U/L — ABNORMAL HIGH (ref 0–44)
AST: 159 U/L — ABNORMAL HIGH (ref 15–41)
Albumin: 3.8 g/dL (ref 3.5–5.0)
Alkaline Phosphatase: 48 U/L (ref 38–126)
Anion gap: 8 (ref 5–15)
BUN: 7 mg/dL (ref 6–20)
CO2: 24 mmol/L (ref 22–32)
Calcium: 8.9 mg/dL (ref 8.9–10.3)
Chloride: 105 mmol/L (ref 98–111)
Creatinine, Ser: 0.88 mg/dL (ref 0.44–1.00)
GFR, Estimated: >60 mL/min (ref >60)
Glucose, Bld: 161 mg/dL — ABNORMAL HIGH (ref 70–99)
Potassium: 4.6 mmol/L (ref 3.5–5.1)
Sodium: 137 mmol/L (ref 135–145)
Total Bilirubin: 1.4 mg/dL — ABNORMAL HIGH (ref 0.3–1.2)
Total Protein: 7.2 g/dL (ref 6.5–8.1)

## 2020-04-17 LAB — GLUCOSE, CAPILLARY
Glucose-Capillary: 126 mg/dL — ABNORMAL HIGH (ref 70–99)
Glucose-Capillary: 131 mg/dL — ABNORMAL HIGH (ref 70–99)
Glucose-Capillary: 135 mg/dL — ABNORMAL HIGH (ref 70–99)
Glucose-Capillary: 150 mg/dL — ABNORMAL HIGH (ref 70–99)
Glucose-Capillary: 161 mg/dL — ABNORMAL HIGH (ref 70–99)
Glucose-Capillary: 163 mg/dL — ABNORMAL HIGH (ref 70–99)
Glucose-Capillary: 174 mg/dL — ABNORMAL HIGH (ref 70–99)

## 2020-04-17 LAB — SURGICAL PATHOLOGY

## 2020-04-17 MED ORDER — KETOROLAC TROMETHAMINE 15 MG/ML IJ SOLN
15.0000 mg | Freq: Three times a day (TID) | INTRAMUSCULAR | Status: DC | PRN
  Administered 2020-04-17 – 2020-04-18 (×2): 15 mg via INTRAVENOUS
  Filled 2020-04-17 (×2): qty 1

## 2020-04-17 NOTE — Progress Notes (Signed)
Pt declined nocturnal cpap.  Pt was encouraged to call should she change her mind.  RN aware.

## 2020-04-17 NOTE — Progress Notes (Signed)
Patient alert and oriented, Post op day 1.  Provided support and encouragement.  Encouraged pulmonary toilet, ambulation and small sips of liquids.    Pt is in bed watching Lovenox video and finishing last cup of water.  Pt encouraged to attempt ambulation again this morning. MD updated.   All questions answered.  Will continue to monitor.

## 2020-04-17 NOTE — TOC Benefit Eligibility Note (Signed)
Transition of Care [facility]) Benefit Eligibility Note    Patient Details  Name: [name] MRN: [id number] Date of Birth: 1990/02/04   Medication/Dose: Judie Petit lovenox 40m bid x 2 weeks  Covered?: Yes  Tier: 3 Drug  Prescription Coverage Preferred Pharmacy: local  Spoke with Person/Company/Phone Number:: [phone] Co-Pay: $40.00  Prior Approval: No  Deductible: Met       FKerin SalenPhone Number: 04/17/2020, 1:05 PM

## 2020-04-17 NOTE — Progress Notes (Signed)
Patient alert and oriented, pain is controlled. Patient is tolerating fluids, advanced to protein shake today, patient is tolerating well.  Reviewed Gastric sleeve discharge instructions with patient and patient is able to articulate understanding.  Provided information on BELT program, Support Group and [facility] outpatient pharmacy. All questions answered, will continue to monitor.  

## 2020-04-17 NOTE — Progress Notes (Signed)
1 Day Post-Op   Subjective/Chief Complaint: Nausea. Fair amount of upper abd pain - pain at extraction site  Was still on water earlier this am.    Objective: Vital signs in last 24 hours: Temp:  [98.1 F (36.7 C)-98.8 F (37.1 C)] 98.6 F (37 C) (03/22 1448) Pulse Rate:  [87-110] 87 (03/22 1448) Resp:  [0-20] 17 (03/22 1448) BP: (134-173)/(68-121) 134/90 (03/22 1448) SpO2:  [77 %-100 %] 95 % (03/22 1448) Last BM Date: 04/15/20  Intake/Output from previous day: 03/21 0701 - 03/22 0700 In: 2651.8 [P.O.:240; I.V.:2311.8; IV Piggyback:100] Out: 900 [Urine:900] Intake/Output this shift: Total I/O In: 950.5 [P.O.:210; I.V.:740.5] Out: 0   Alert, nontoxic Nonlabored, IS 1300 Tachy Soft, obese, incisions ok - partially open 1 incision has expected scant serosang drainage +scd  Lab Results:  Recent Labs    04/16/20 1440 04/17/20 0526  WBC  --  8.4  HGB 13.5 12.8  HCT 43.4 41.0  PLT  --  274   BMET Recent Labs    04/17/20 0526  NA 137  K 4.6  CL 105  CO2 24  GLUCOSE 161*  BUN 7  CREATININE 0.88  CALCIUM 8.9   PT/INR No results for input(s): LABPROT, INR in the last 72 hours. ABG No results for input(s): PHART, HCO3 in the last 72 hours.  Invalid input(s): PCO2, PO2  Studies/Results: No results found.  Anti-infectives: Anti-infectives (From admission, onward)   Start     Dose/Rate Route Frequency Ordered Stop   04/16/20 0915  cefoTEtan (CEFOTAN) 2 g in sodium chloride 0.9 % 100 mL IVPB        2 g 200 mL/hr over 30 Minutes Intravenous On call to O.R. 04/16/20 0903 04/16/20 1212      Assessment/Plan: s/p Procedure(s): LAPAROSCOPIC GASTRIC SLEEVE RESECTION (N/A) UPPER GI ENDOSCOPY (N/A)  Her baseline HR in clinic was 120, preop115 so I believe her baseline hr is this.  Wbc ok. No fever Cont post op diet as tolerated Not ready dc today due to inadequate PO Given her BMI, would recommend lovenox on dc for 2 weeks Ambulation, pulm toilet  Laura Benson. Laura Campanile, MD, FACS General, Bariatric, & Minimally Invasive Surgery [facility] Surgery, [place]   LOS: 1 day    Laura Benson 04/17/2020

## 2020-04-17 NOTE — Progress Notes (Signed)
Nutrition Education Note ° °Received consult for diet education for patient s/p bariatric surgery. ° °Discussed 2 week post op diet with pt. Emphasized that liquids must be non carbonated, non caffeinated, and sugar free. Fluid goals discussed. Pt to follow up with outpatient bariatric RD for further diet progression after 2 weeks. Multivitamins and minerals also reviewed. Teach back method used, pt expressed understanding, expect good compliance. ° °If nutrition issues arise, please consult RD. ° °Joyceline Maiorino, MS, RD, LDN °Inpatient Clinical Dietitian °Contact information available via Amion ° ° °

## 2020-04-17 NOTE — Progress Notes (Signed)
Inpatient Diabetes Program Recommendations  AACE/ADA: New Consensus Statement on Inpatient Glycemic Control (2015)  Target Ranges:  Prepandial:   less than 140 mg/dL      Peak postprandial:   less than 180 mg/dL (1-2 hours)      Critically ill patients:  140 - 180 mg/dL   Lab Results  Component Value Date   GLUCAP 131 (H) 04/17/2020   HGBA1C 8.9 (H) 04/05/2020    Review of Glycemic Control  Diabetes history: DM2 Outpatient Diabetes medications: Lantus 16 units QHS, glyburide 5 mg BID, metformin 1000 mg BID Current orders for Inpatient glycemic control: Novolog 0-20 units Q4H  HgbA1C - 8.9%  Inpatient Diabetes Program Recommendations:     For discharge:  Novolog insulin pen 0-20 units TID with meals and 0-5 HS (Novolog or Humalog, whichever [employer] prefers)  Instructed pt to monitor blood sugars at least 4x/day and call PCP/Endo if blood sugars consistently > 180 mg/dL. Discussed hypoglycemia s/s and treatment.  Answered questions. Pt voices understanding.   Thank you. Ailene Ards, RD, LDN, CDE Inpatient Diabetes Coordinator [phone]

## 2020-04-17 NOTE — Progress Notes (Signed)
Patient un able to ambulate in the hallway due to complains of dizziness. We will continue to monitor.

## 2020-04-17 NOTE — TOC Benefit Eligibility Note (Signed)
Transition of Care [facility]) Benefit Eligibility Note    Patient Details  Name: Laura Benson MRN: [id number] Date of Birth: September 25, 1990   Medication/Dose: Judie Petit lovenox $RemoveBefor'60mg'eMkaecnMzxYj$  bid x 2 weeks  Covered?: Yes  Tier: 3 Drug  Prescription Coverage Preferred Pharmacy: local  Spoke with Person/Company/Phone Number:: [place] [phone]  Co-Pay: $40.00  Prior Approval: No  Deductible: Met       Kerin Salen Phone Number: 04/17/2020, 1:02 PM

## 2020-04-17 NOTE — Progress Notes (Signed)
PHARMACY CONSULT FOR:  Risk Assessment for Post-Discharge VTE Following Bariatric Surgery  Post-Discharge VTE Risk Assessment: This patient's probability of 30-day post-discharge VTE is increased due to the factors marked:   Female    Age >/=60 years  X  BMI >/=50 kg/m2    CHF    Dyspnea at Rest    Paraplegia  X  Non-gastric-band surgery    Operation Time >/=3 hr    Return to OR     Length of Stay >/= 3 d   Hx of VTE   Hypercoagulable condition   Significant venous stasis    Predicted probability of 30-day post-discharge VTE: 0.27%  Other patient-specific factors to consider: D/t significant degree of obesity (BMI > 75), Surgery would prefer Lovenox prophylaxis for 2 wks   Recommendation for Discharge:  Based on BMI > 50 and Surgery request; recommend Lovenox 60 mg SQ q12 hr x 2 weeks at discharge  Case manager consulted to review insurance coverage and provide estimated patient cost   Laura Benson is a 30 y.o. female who underwent laparoscopic sleeve gastrectomy on 04/16/20   Case start: 1158 Case end: 1349   No Known Allergies  Patient Measurements: Height: 5\' 6"  (167.6 cm) Weight: (!) 211.8 kg (467 lb) IBW/kg (Calculated) : 59.3 Body mass index is 75.38 kg/m.  Recent Labs    04/16/20 1440 04/17/20 0526  WBC  --  8.4  HGB 13.5 12.8  HCT 43.4 41.0  PLT  --  274  CREATININE  --  0.88  ALBUMIN  --  3.8  PROT  --  7.2  AST  --  159*  ALT  --  125*  ALKPHOS  --  48  BILITOT  --  1.4*   Estimated Creatinine Clearance: 177.5 mL/min (by C-G formula based on SCr of 0.88 mg/dL).    Past Medical History:  Diagnosis Date  . Anxiety   . Asthma   . Complication of anesthesia    Take for a while to wake up  . Depression   . Diabetes mellitus without complication (HCC)   . Hypertension   . Morbid obesity (HCC)   . Pneumonia   . Sleep apnea    CPAP  . Tachycardia      Medications Prior to Admission  Medication Sig Dispense Refill Last Dose  .  acetaminophen (TYLENOL) 500 MG tablet Take 1,000 mg by mouth 2 (two) times daily as needed (pain).   Past Month at Unknown time  . albuterol (VENTOLIN HFA) 108 (90 Base) MCG/ACT inhaler Inhale 2 puffs into the lungs every 6 (six) hours as needed for wheezing or shortness of breath. 1 each 2 Past Month at Unknown time  . escitalopram (LEXAPRO) 20 MG tablet TAKE 1 TABLET BY MOUTH EVERY DAY 90 tablet 1 04/15/2020 at Unknown time  . glyBURIDE (DIABETA) 5 MG tablet TAKE 1 TABLET (5 MG TOTAL) BY MOUTH 2 (TWO) TIMES DAILY WITH A MEAL. 180 tablet 0 04/15/2020 at Unknown time  . hydrOXYzine (ATARAX/VISTARIL) 25 MG tablet TAKE 1 TABLET (25 MG TOTAL) BY MOUTH 3 (THREE) TIMES DAILY AS NEEDED FOR ANXIETY. 90 tablet 2 04/15/2020 at Unknown time  . insulin glargine, 2 Unit Dial, (TOUJEO MAX SOLOSTAR) 300 UNIT/ML Solostar Pen Inject 16 Units into the skin at bedtime. 15 mL 1 04/15/2020 at Unknown time  . Insulin Pen Needle 32G X 4 MM MISC To use with toujeo 50 each 1 04/15/2020 at Unknown time  . Lancets (ONETOUCH DELICA PLUS LANCET33G) MISC  USE DAILY TO CHECK BLOOD SUGAR 100 each 12 04/15/2020 at Unknown time  . lisinopril-hydrochlorothiazide (ZESTORETIC) 20-12.5 MG tablet Take 1 tablet by mouth daily. 90 tablet 2 04/15/2020 at Unknown time  . meloxicam (MOBIC) 15 MG tablet Take 15 mg by mouth daily as needed (back pain).   04/15/2020 at Unknown time  . metFORMIN (GLUCOPHAGE XR) 500 MG 24 hr tablet Take 2 tablets (1,000 mg total) by mouth in the morning and at bedtime. 120 tablet 5 04/15/2020 at Unknown time  . metoprolol succinate (TOPROL-XL) 25 MG 24 hr tablet TAKE 1 TABLET (25 MG TOTAL) BY MOUTH DAILY. FOR HIGH BLOOD PRESSURE 30 tablet 8 04/15/2020 at Unknown time  . ondansetron (ZOFRAN-ODT) 4 MG disintegrating tablet Take 1 tablet (4 mg total) by mouth every 8 (eight) hours as needed for nausea or vomiting. 20 tablet 0 04/15/2020 at Unknown time  . ONETOUCH VERIO test strip USE AS DAILY TO CHECK BLOOD SUGAR. 100 strip 12  04/15/2020 at Unknown time       Laura Benson A 04/17/2020,10:34 AM

## 2020-04-18 ENCOUNTER — Other Ambulatory Visit (HOSPITAL_COMMUNITY): Payer: Self-pay | Admitting: General Surgery

## 2020-04-18 ENCOUNTER — Inpatient Hospital Stay (HOSPITAL_COMMUNITY): Payer: BC Managed Care – PPO

## 2020-04-18 LAB — CBC WITH DIFFERENTIAL/PLATELET
Abs Immature Granulocytes: 0.02 10*3/uL (ref 0.00–0.07)
Basophils Absolute: 0 10*3/uL (ref 0.0–0.1)
Basophils Relative: 0 %
Eosinophils Absolute: 0.1 10*3/uL (ref 0.0–0.5)
Eosinophils Relative: 1 %
HCT: 39.8 % (ref 36.0–46.0)
Hemoglobin: 12 g/dL (ref 12.0–15.0)
Immature Granulocytes: 0 %
Lymphocytes Relative: 27 %
Lymphs Abs: 2.1 10*3/uL (ref 0.7–4.0)
MCH: 23.7 pg — ABNORMAL LOW (ref 26.0–34.0)
MCHC: 30.2 g/dL (ref 30.0–36.0)
MCV: 78.7 fL — ABNORMAL LOW (ref 80.0–100.0)
Monocytes Absolute: 0.8 10*3/uL (ref 0.1–1.0)
Monocytes Relative: 11 %
Neutro Abs: 4.6 10*3/uL (ref 1.7–7.7)
Neutrophils Relative %: 61 %
Platelets: 236 10*3/uL (ref 150–400)
RBC: 5.06 MIL/uL (ref 3.87–5.11)
RDW: 14.6 % (ref 11.5–15.5)
WBC: 7.6 10*3/uL (ref 4.0–10.5)
nRBC: 0 % (ref 0.0–0.2)

## 2020-04-18 LAB — GLUCOSE, CAPILLARY
Glucose-Capillary: 134 mg/dL — ABNORMAL HIGH (ref 70–99)
Glucose-Capillary: 134 mg/dL — ABNORMAL HIGH (ref 70–99)
Glucose-Capillary: 154 mg/dL — ABNORMAL HIGH (ref 70–99)
Glucose-Capillary: 124 mg/dL — ABNORMAL HIGH (ref 70–99)

## 2020-04-18 MED ORDER — ACETAMINOPHEN 500 MG PO TABS: 1000.0000 mg | ORAL_TABLET | Freq: Three times a day (TID) | ORAL | 0 refills | Status: AC

## 2020-04-18 MED ORDER — ALBUTEROL SULFATE (2.5 MG/3ML) 0.083% IN NEBU: 3.0000 mL | INHALATION_SOLUTION | RESPIRATORY_TRACT | Status: DC | PRN

## 2020-04-18 MED ORDER — GABAPENTIN 100 MG PO CAPS: 200.0000 mg | ORAL_CAPSULE | Freq: Two times a day (BID) | ORAL | 0 refills | Status: DC

## 2020-04-18 MED ORDER — INSULIN ASPART 100 UNIT/ML FLEXPEN
0.0000 [IU] | PEN_INJECTOR | Freq: Three times a day (TID) | SUBCUTANEOUS | 2 refills | Status: DC
Start: 2020-04-18 — End: 2020-06-27

## 2020-04-18 MED ORDER — PANTOPRAZOLE SODIUM 40 MG PO TBEC: 40.0000 mg | DELAYED_RELEASE_TABLET | Freq: Every day | ORAL | 0 refills | Status: DC

## 2020-04-18 MED ORDER — OXYCODONE HCL 5 MG PO TABS: 5.0000 mg | ORAL_TABLET | Freq: Four times a day (QID) | ORAL | 0 refills | Status: DC | PRN

## 2020-04-18 MED ORDER — IPRATROPIUM-ALBUTEROL 0.5-2.5 (3) MG/3ML IN SOLN: 3.0000 mL | Freq: Two times a day (BID) | RESPIRATORY_TRACT | Status: DC

## 2020-04-18 MED ORDER — ONDANSETRON 4 MG PO TBDP: 4.0000 mg | ORAL_TABLET | Freq: Four times a day (QID) | ORAL | 0 refills | Status: DC | PRN

## 2020-04-18 MED ORDER — ENOXAPARIN SODIUM 60 MG/0.6ML ~~LOC~~ SOLN: 60.0000 mg | Freq: Two times a day (BID) | SUBCUTANEOUS | 0 refills | Status: DC

## 2020-04-18 MED FILL — ONDANSETRON ODT 4 MG TABLET: 4 | 30 days supply | Qty: 10 | Fill #0

## 2020-04-18 MED FILL — GABAPENTIN 100 MG CAPSULE: 100 | 5 days supply | Qty: 20 | Fill #0

## 2020-04-18 MED FILL — HUMALOG 100 UNITS/ML KWIKPE: 100 | 25 days supply | Qty: 15 | Fill #0

## 2020-04-18 MED FILL — ENOXAPARIN SODIUM 60 MG/0.6: 60 | 14 days supply | Qty: 17 | Fill #0

## 2020-04-18 MED FILL — oxyCODONE HCL 5 MG TABS: 5 | 2 days supply | Qty: 10 | Fill #0

## 2020-04-18 MED FILL — PANTOPRAZOLE SOD DR 40 MG T: 40 | 30 days supply | Qty: 30 | Fill #0

## 2020-04-18 NOTE — Progress Notes (Signed)
Removed wick from middle abdominal incision per Dr. Tawana Scale instruction and recovered with dry gauze.  Pt instructed to change gauze to incision as needed.    Pt's oxygen saturation maintained 91% and above on room air during discharge and would increase to 96% when using the IS.  Pt instructed to continue using IS while recovering at home.    24hr fluid recall- .  Per dehydration protocol, will call pt for f/u in one week post op.

## 2020-04-18 NOTE — Progress Notes (Signed)
Patient alert and oriented, Post op day 2.  Provided support and encouragement.  Encouraged pulmonary toilet with IS, ambulation in the hallway and small sips of liquids.   Pt to have evaluation by RT for SOB prior to D/C. Denies any calf pain or swelling.  Pt is up to chair.     All questions answered.  Will continue to monitor.

## 2020-04-18 NOTE — Discharge Instructions (Signed)
For blood sugars: 0 to 120           give 0 units novolog 121 to 150       Give 3 units novolog 151 to 200       Give 4 units novolog 201 to 250       Give 7 unitsnovolog 251 to 300       Give  11 units novolog 301 to 350       Give 15 units novolog 351 to 400       Give 20 units novolog >400                Call Doctor   GASTRIC BYPASS / SLEEVE  Home Care Instructions  These instructions are to help you care for yourself when you go home.  Call: If you have any problems. . Call [phone] and ask for the surgeon on call . If you have an emergency related to your surgery please use the ER at [facility]  . Tell the ER staff that you are a new post-op gastric bypass or gastric sleeve patient   Signs and symptoms to report: . Severe vomiting or nausea o If you cannot handle clear liquids for longer than 1 day, call your surgeon  . Abdominal pain which does not get better after taking your pain medication . Fever greater than 100.4 F and chills . Heart rate over 100 beats a minute . Trouble breathing . Chest pain .  Redness, swelling, drainage, or foul odor at incision (surgical) sites .  If your incisions open or pull apart . Swelling or pain in calf (lower leg) . Diarrhea (Loose bowel movements that happen often), frequent watery, uncontrolled bowel movements . Constipation, (no bowel movements for 3 days) if this happens:  o Take Milk of Magnesia, 2 tablespoons by mouth, 3 times a day for 2 days if needed o Stop taking Milk of Magnesia once you have had a bowel movement o Call your doctor if constipation continues Or o Take Miralax  (instead of Milk of Magnesia) following the label instructions o Stop taking Miralax once you have had a bowel movement o Call your doctor if constipation continues . Anything you think is "abnormal for you"   Normal side effects after surgery: . Unable to sleep at night or unable to concentrate . Irritability . Being tearful (crying) or  depressed These are common complaints, possibly related to your anesthesia, stress of surgery and change in lifestyle, that usually go away a few weeks after surgery.  If these feelings continue, call your medical doctor.  Wound Care: You may have surgical glue, steri-strips, or staples over your incisions after surgery . Surgical glue:  Looks like a clear film over your incisions and will wear off a little at a time . Steri-strips : Adhesive strips of tape over your incisions. You may notice a yellowish color on the skin under the steri-strips. This is used to make the   steri-strips stick better. Do not pull the steri-strips off - let them fall off . Staples: Cherlynn Polo may be removed before you leave the hospital o If you go home with staples, call [facility] at for an appointment with your surgeon's nurse to have staples removed 10 days after surgery, [phone] . Showering: You may shower two (2) days after your surgery unless your surgeon tells you differently o Wash gently around incisions with warm soapy water, rinse well, and gently pat dry  o If you have a drain (tube from your incision), you may need someone to hold this while you shower  o No tub baths until staples are removed and incisions are healed     Medications: Marland Kitchen Medications should be liquid or crushed if larger than the size of a dime . Extended release pills (medication that releases a little bit at a time through the day) should not be crushed . Depending on the size and number of medications you take, you may need to space (take a few throughout the day)/change the time you take your medications so that you do not over-fill your pouch (smaller stomach) . Make sure you follow-up with your primary care physician to make medication changes needed during rapid weight loss and life-style changes . If you have diabetes, follow up with the doctor that orders your diabetes medication(s) within one week after surgery and  check your blood sugar regularly. . Do not drive while taking narcotics (pain medications) . DO NOT take NSAID'S (Examples of NSAID's include ibuprofen, naproxen)  Diet:                    First 2 Weeks  You will see the nutritionist about two (2) weeks after your surgery. The nutritionist will increase the types of foods you can eat if you are handling liquids well: Marland Kitchen If you have severe vomiting or nausea and cannot handle clear liquids lasting longer than 1 day, call your surgeon  Protein Shake . Drink at least 2 ounces of shake 5-6 times per day . Each serving of protein shakes (usually 8 - 12 ounces) should have a minimum of:  o 15 grams of protein  o And no more than 5 grams of carbohydrate  . Goal for protein each day: o Men = 80 grams per day o Women = 60 grams per day . Protein powder may be added to fluids such as non-fat milk or Lactaid milk or Soy milk (limit to 35 grams added protein powder per serving)  Hydration . Slowly increase the amount of water and other clear liquids as tolerated (See Acceptable Fluids) . Slowly increase the amount of protein shake as tolerated  .  Sip fluids slowly and throughout the day . May use sugar substitutes in small amounts (no more than 6 - 8 packets per day; i.e. Splenda)  Fluid Goal . The first goal is to drink at least 8 ounces of protein shake/drink per day (or as directed by the nutritionist);  See handout from pre-op Bariatric Education Class for examples of protein shake/drink.   o Slowly increase the amount of protein shake you drink as tolerated o You may find it easier to slowly sip shakes throughout the day o It is important to get your proteins in first . Your fluid goal is to drink 64 - 100 ounces of fluid daily o It may take a few weeks to build up to this . 32 oz (or more) should be clear liquids  And  . 32 oz (or more) should be full liquids (see below for examples) . Liquids should not contain sugar, caffeine, or  carbonation  Clear Liquids: . Water or Sugar-free flavored water (i.e. Fruit H2O, Propel) . Decaffeinated coffee or tea (sugar-free) . [employer], [employer], [employer] . Sugar-free Jell-O . Bouillon or broth . Sugar-free Popsicle:   *Less than 20 calories each; Limit 1 per day  Full Liquids: Protein Shakes/Drinks + 2 choices per  day of other full liquids . Full liquids must be: o No More Than 12 grams of Carbs per serving  o No More Than 3 grams of Fat per serving . Strained low-fat cream soup . Non-Fat milk . Fat-free Lactaid Milk . Sugar-free yogurt (Dannon Lite [employer] Fit, Greek yogurt)      Vitamins and Minerals . Start 1 day after surgery unless otherwise directed by your surgeon . Bariatric Specific Complete Multivitamins . Chewable Calcium Citrate with Vitamin D-3 (Example: 3 Chewable Calcium Plus 600 with Vitamin D-3) o Take 500 mg three (3) times a day for a total of 1500 mg each day o Do not take all 3 doses of calcium at one time as it may cause constipation, and you can only absorb 500 mg  at a time  o Do not mix multivitamins containing iron with calcium supplements; take 2 hours apart  . Menstruating women and those at risk for anemia (a blood disease that causes weakness) may need extra iron o Talk with your doctor to see if you need more iron . If you need extra iron: Total daily Iron recommendation (including Vitamins) is 50 to 100 mg Iron/day . Do not stop taking or change any vitamins or minerals until you talk to your nutritionist or surgeon . Your nutritionist and/or surgeon must approve all vitamin and mineral supplements   Activity and Exercise: It is important to continue walking at home.  Limit your physical activity as instructed by your doctor.  During this time, use these guidelines: . Do not lift anything greater than ten (10) pounds for at least two (2) weeks . Do not go back to work or drive until Designer, industrial/product says you can . You may have  sex when you feel comfortable  o It is VERY important for female patients to use a reliable birth control method; fertility often increases after surgery  o Do not get pregnant for at least 18 months . Start exercising as soon as your doctor tells you that you can o Make sure your doctor approves any physical activity . Start with a simple walking program . Walk 5-15 minutes each day, 7 days per week.  . Slowly increase until you are walking 30-45 minutes per day Consider joining our BELT program. [phone] or email [email or web address]   Special Instructions Things to remember:  . Use your CPAP when sleeping if this applies to you, do not stop the use of CPAP unless directed by physician after a sleep study . [facility] has a free Bariatric Surgery Support Group that meets monthly, the 3rd Thursday, 6 pm.  Please review discharge information for date and location of this meeting. . It is very important to keep all follow up appointments with your surgeon, nutritionist, primary care physician, and behavioral health practitioner o After the first year, please follow up with your bariatric surgeon and nutritionist at least once a year in order to maintain best weight loss results   [facility]: [phone] [facility]: [phone] Bariatric Nurse Coordinator: [phone]

## 2020-04-18 NOTE — TOC Initial Note (Signed)
Transition of Care [facility]) - Initial/Assessment Note   Patient Details  Name: Laura Benson MRN: [id number] Date of Birth: [birth year]  Transition of Care [facility]) CM/SW Contact:    Ewing Schlein, LCSW Phone Number: 04/18/2020, 3:26 PM  Clinical Narrative: Patient is a 30 year old female who was admitted for laparoscopic sleeve gastrectomy. TOC received consult for [facility]. Per documentation, patient was to be set up with [facility] through [facility] through [facility] that patient has through her employer [employer]).  CSW followed up with Pearson Grippe with [facility] and was told they did not receive a referral from [facility], but will be able to accept the patient. CSW followed up with [name] at [facility] to confirm the referral was made. Per [name], the referral was made but it was not documented who accepted the referral. CSW updated RN. HH orders to be placed. CSW updated Pearson Grippe.  Expected Discharge Plan: Home w Home Health Services Barriers to Discharge: Continued Medical Work up  Patient Goals and CMS Choice Choice offered to / list presented to : Patient  Expected Discharge Plan and Services Expected Discharge Plan: Home w Home Health Services In-house Referral: Clinical Social Work Post Acute Care Choice: Home Health Living arrangements for the past 2 months: Apartment             DME Arranged: N/A DME Agency: NA HH Arranged: RN HH Agency: [facility] Date HH Agency Contacted: 04/18/20 Representative spoke with at [facility] Agency: Pearson Grippe  Prior Living Arrangements/Services Living arrangements for the past 2 months: Apartment Patient language and need for interpreter reviewed:: Yes Need for Family Participation in Patient Care: No (Comment) Care giver support system in place?: Yes (comment) Criminal Activity/Legal Involvement Pertinent to Current Situation/Hospitalization: No - Comment as needed  Activities of Daily Living Home Assistive Devices/Equipment: CPAP,CBG Meter,Blood  pressure cuff,Eyeglasses ADL Screening (condition at time of admission) Patient's cognitive ability adequate to safely complete daily activities?: Yes Is the patient deaf or have difficulty hearing?: No Does the patient have difficulty seeing, even when wearing glasses/contacts?: No Does the patient have difficulty concentrating, remembering, or making decisions?: No Patient able to express need for assistance with ADLs?: Yes Does the patient have difficulty dressing or bathing?: No Independently performs ADLs?: Yes (appropriate for developmental age) Does the patient have difficulty walking or climbing stairs?: Yes Weakness of Legs: None Weakness of Arms/Hands: None  Emotional Assessment Appearance:: Appears stated age Orientation: : Oriented to Self,Oriented to Place,Oriented to  Time,Oriented to Situation Alcohol / Substance Use: Not Applicable  Admission diagnosis:  S/P laparoscopic sleeve gastrectomy [Z98.84] Patient Active Problem List   Diagnosis Date Noted  . 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 Encounter for gynecological examination without abnormal finding 02/03/2020  . Type 2 diabetes mellitus with hyperglycemia, without long-term current use of insulin (HCC) 01/18/2019  . Uncomplicated asthma 06/08/2018  . Tachycardia   . MDD (major depressive disorder), severe (HCC) 05/05/2017  . Alcohol abuse 04/30/2017  . Umbilical hernia without obstruction and without gangrene 02/25/2017  . Obstructive sleep apnea 12/11/2016  . Diabetes mellitus without complication (HCC) 01/23/2016  . Hypertension 01/23/2016  . Dysmenorrhea 12/25/2013  . Rectal bleeding 10/20/2012  . Depression, recurrent (HCC) 01/05/2009  . BREAST PAIN, BILATERAL 01/05/2009  . INTERTRIGO, CANDIDAL 01/05/2009  . Amenorrhea 09/20/2008  . DELAYED MENSES 08/25/2008  . ASCUS PAP 05/05/2008  . Severe obesity (BMI >= 40) (HCC) 03/26/2006  . HYPERTENSION, BENIGN SYSTEMIC 03/26/2006   PCP:  Sharlene Dory, DO  Pharmacy:   [id number] - [place], [place] - [address] AT ACROSS [address] [place] [address] Phone: [phone] Fax: [phone]  Readmission Risk Interventions No flowsheet data found.

## 2020-04-18 NOTE — Progress Notes (Signed)
I have spoken with Remo Lipps from [facility]) to clarify Home Health inquiry for pt.  Remo Lipps confirmed that [facility] in [facility] will f/u with pt once any notes, demographics, and/or orders were faxed by Dr. Andrey Campanile to [phone]. Will continue to f/u.

## 2020-04-19 NOTE — Discharge Summary (Signed)
Physician Discharge Summary  Laura Benson JHE:[id number] DOB: [birth year] DOA: 04/16/2020  PCP: Shelda Pal, DO  Admit date: 04/16/2020 Discharge date: 04/18/2020  Recommendations for Outpatient Follow-up:    Follow-up Information    Greer Pickerel, MD. Go on 05/16/2020.   Specialty: General Surgery Why: at 11am.  Please arrive 15 minutes prior to your appointment time.  Thank you. Contact information: [address] [place] [address] [phone]        Carlena Hurl, PA-C. Go on 06/05/2020.   Specialty: General Surgery Why: at 4:30pm for Dr. Redmond Pulling.  Please arrive 15 minutes prior to your appointment time.  Thank you. Contact information: [address] [place] [address] [phone]              Discharge Diagnoses:  Principal Problem:   Severe obesity (BMI >= 40) (HCC) Active Problems:   Diabetes mellitus without complication ([place])   Hypertension   Obstructive sleep apnea   Uncomplicated asthma   S/P laparoscopic sleeve gastrectomy   Surgical Procedure: Laparoscopic Sleeve Gastrectomy, upper endoscopy  Discharge Condition: Good Disposition: Home  Diet recommendation: Postoperative sleeve gastrectomy diet (liquids only)  Filed Weights   04/16/20 0931  Weight: (!) 211.8 kg     Hospital Course:  The patient was admitted for a planned laparoscopic sleeve gastrectomy. Please see operative note. Preoperatively the patient was given 5000 units of subcutaneous heparin for DVT prophylaxis. Postoperative prophylactic Lovenox dosing was started on the evening of postoperative day 0. ERAS protocol was used. On the evening of postoperative day 0, the patient was started on water and ice chips. On postoperative day 1 the patient had no fever and was tolerating water in their diet was gradually advanced throughout the day. She had tachycardia but her preop vitals and office vitals had resting tachycardia. Her HR improved thru her stay.  She was kept til pod 2 bc she was having some difficulty meeting oral intake and pain control at her extraction site. On POD 2, The patient was ambulating without difficulty. Their vital signs are stable without fever or tachycardia. Their hemoglobin had remained stable.  The patient had received discharge instructions and counseling. They were deemed stable for discharge and had met discharge criteria  She had had a diabetes consult and was sent out on sliding scale insulin Because of her BMI she was sent out on extended prophylactic lovenox Her extraction incision was only partially closed and the surgical packing strip was removed. She will have some drainage and will just cover with gauze and change daily  We arrange home health nursing to monitor incision, addl instruction on sliding scale insulin, and lovenox  BP (!) 147/98 (BP Location: Left Arm)   Pulse 89   Temp 98.4 F (36.9 C) (Oral)   Resp 16   Ht $R'5\' 6"'Ld$  (1.676 m)   Wt (!) 211.8 kg   LMP 03/22/2020   SpO2 99%   BMI 75.38 kg/m   Gen: alert, NAD, non-toxic appearing Pupils: equal, no scleral icterus Pulm: Lungs clear to auscultation, symmetric chest rise CV: regular rate and rhythm Abd: soft, mild approp tender, nondistended.  No cellulitis. No incisional hernia Ext: no edema, no calf tenderness Skin: no rash, no jaundice   Discharge Instructions  Discharge Instructions    Ambulate hourly while awake   Complete by: As directed    Call MD for:  difficulty breathing, headache or visual disturbances   Complete by: As directed  Call MD for:  persistant dizziness or light-headedness   Complete by: As directed    Call MD for:  persistant nausea and vomiting   Complete by: As directed    Call MD for:  redness, tenderness, or signs of infection (pain, swelling, redness, odor or green/yellow discharge around incision site)   Complete by: As directed    Call MD for:  severe uncontrolled pain   Complete by: As directed     Call MD for:  temperature >101 F   Complete by: As directed    Diet bariatric full liquid   Complete by: As directed    Discharge instructions   Complete by: As directed    See bariatric discharge instructions   Face-to-face encounter (required for Medicare/Medicaid patients)   Complete by: As directed    I Greer Pickerel certify that this patient is under my care and that I, or a nurse practitioner or physician's assistant working with me, had a face-to-face encounter that meets the physician face-to-face encounter requirements with this patient on 04/18/2020. The encounter with the patient was in whole, or in part for the following medical condition(s) which is the primary reason for home health care (List medical condition): assess open wound, instruct on lovenox injections, monitor blood sugar/new diabetes medication   The encounter with the patient was in whole, or in part, for the following medical condition, which is the primary reason for home health care: severe obesity; s/p bariatric surgery   I certify that, based on my findings, the following services are medically necessary home health services: Nursing   Reason for Medically Necessary Home Health Services: Skilled Nursing- Changes in Medication/Medication Management   My clinical findings support the need for the above services: Shortness of breath with activity   Further, I certify that my clinical findings support that this patient is homebound due to: Shortness of Breath with activity   Home Health   Complete by: As directed    To provide the following care/treatments: RN   Incentive spirometry   Complete by: As directed    Perform hourly while awake     Allergies as of 04/18/2020   No Known Allergies     Medication List    STOP taking these medications   glyBURIDE 5 MG tablet Commonly known as: DIABETA   Insulin Pen Needle 32G X 4 MM Misc   meloxicam 15 MG tablet Commonly known as: MOBIC   metFORMIN 500 MG 24 hr  tablet Commonly known as: Glucophage XR   Toujeo Max SoloStar 300 UNIT/ML Solostar Pen Generic drug: insulin glargine (2 Unit Dial)     TAKE these medications   acetaminophen 500 MG tablet Commonly known as: TYLENOL Take 2 tablets (1,000 mg total) by mouth every 8 (eight) hours for 5 days. What changed:   when to take this  reasons to take this   albuterol 108 (90 Base) MCG/ACT inhaler Commonly known as: VENTOLIN HFA Inhale 2 puffs into the lungs every 6 (six) hours as needed for wheezing or shortness of breath.   enoxaparin 60 MG/0.6ML injection Commonly known as: LOVENOX Inject 0.6 mLs (60 mg total) into the skin every 12 (twelve) hours for 14 days.   escitalopram 20 MG tablet Commonly known as: LEXAPRO TAKE 1 TABLET BY MOUTH EVERY DAY   gabapentin 100 MG capsule Commonly known as: NEURONTIN Take 2 capsules (200 mg total) by mouth every 12 (twelve) hours.   hydrOXYzine 25 MG tablet Commonly known as: ATARAX/VISTARIL TAKE  1 TABLET (25 MG TOTAL) BY MOUTH 3 (THREE) TIMES DAILY AS NEEDED FOR ANXIETY.   insulin aspart 100 UNIT/ML FlexPen Commonly known as: NOVOLOG Inject 0-20 Units into the skin 3 (three) times daily with meals. See sliding scale instructions   lisinopril-hydrochlorothiazide 20-12.5 MG tablet Commonly known as: Zestoretic Take 1 tablet by mouth daily. Notes to patient: Monitor Blood Pressure Daily and keep a log for primary care physician.  You may need to make changes to your medications with rapid weight loss.     metoprolol succinate 25 MG 24 hr tablet Commonly known as: TOPROL-XL TAKE 1 TABLET (25 MG TOTAL) BY MOUTH DAILY. FOR HIGH BLOOD PRESSURE Notes to patient: Monitor Blood Pressure Daily and keep a log for primary care physician.  You may need to make changes to your medications with rapid weight loss.     ondansetron 4 MG disintegrating tablet Commonly known as: ZOFRAN-ODT Take 1 tablet (4 mg total) by mouth every 8 (eight) hours as needed  for nausea or vomiting. What changed: Another medication with the same name was added. Make sure you understand how and when to take each.   ondansetron 4 MG disintegrating tablet Commonly known as: ZOFRAN-ODT Take 1 tablet (4 mg total) by mouth every 6 (six) hours as needed for nausea or vomiting. What changed: You were already taking a medication with the same name, and this prescription was added. Make sure you understand how and when to take each.   OneTouch Delica Plus [id number] Misc USE DAILY TO CHECK BLOOD SUGAR   OneTouch Verio test strip Generic drug: glucose blood USE AS DAILY TO CHECK BLOOD SUGAR.   oxyCODONE 5 MG immediate release tablet Commonly known as: Oxy IR/ROXICODONE Take 1 tablet (5 mg total) by mouth every 6 (six) hours as needed for severe pain.   pantoprazole 40 MG tablet Commonly known as: PROTONIX Take 1 tablet (40 mg total) by mouth daily.       Follow-up Information    Greer Pickerel, MD. Go on 05/16/2020.   Specialty: General Surgery Why: at 11am.  Please arrive 15 minutes prior to your appointment time.  Thank you. Contact information: [address] [place] [address] [phone]        Carlena Hurl, PA-C. Go on 06/05/2020.   Specialty: General Surgery Why: at 4:30pm for Dr. Redmond Pulling.  Please arrive 15 minutes prior to your appointment time.  Thank you. Contact information: [address] [place] [address] [phone]                The results of significant diagnostics from this hospitalization (including imaging, microbiology, ancillary and laboratory) are listed below for reference.    Significant Diagnostic Studies: DG Chest 2 View  Result Date: 04/18/2020 CLINICAL DATA:  Shortness of breath following recent bariatric surgery EXAM: CHEST - 2 VIEW COMPARISON:  07/06/2018 FINDINGS: The heart size and mediastinal contours are within normal limits. Both lungs are clear. The visualized skeletal structures are  unremarkable. IMPRESSION: No active cardiopulmonary disease. Electronically Signed   By: Inez Catalina M.D.   On: 04/18/2020 16:41    Labs: Basic Metabolic Panel: Recent Labs  Lab 04/17/20 0526  NA 137  K 4.6  CL 105  CO2 24  GLUCOSE 161*  BUN 7  CREATININE 0.88  CALCIUM 8.9   Liver Function Tests: Recent Labs  Lab 04/17/20 0526  AST 159*  ALT 125*  ALKPHOS 48  BILITOT 1.4*  PROT 7.2  ALBUMIN 3.8    CBC: Recent Labs  Lab 04/16/20 1440 04/17/20 0526 04/18/20 0448  WBC  --  8.4 7.6  NEUTROABS  --  6.3 4.6  HGB 13.5 12.8 12.0  HCT 43.4 41.0 39.8  MCV  --  77.5* 78.7*  PLT  --  274 236    CBG: Recent Labs  Lab 04/17/20 2346 04/18/20 0456 04/18/20 0752 04/18/20 1126 04/18/20 1555  GLUCAP 150* 134* 134* 154* 124*    Principal Problem:   Severe obesity (BMI >= 40) (HCC) Active Problems:   Diabetes mellitus without complication ([facility])   Hypertension   Obstructive sleep apnea   Uncomplicated asthma   S/P laparoscopic sleeve gastrectomy   Time coordinating discharge: 15 min  Signed:  Gayland Curry, MD [facility] Surgery, [place] [phone] 04/19/2020, 2:09 PM

## 2020-04-24 ENCOUNTER — Telehealth (HOSPITAL_COMMUNITY): Payer: Self-pay | Admitting: *Deleted

## 2020-04-24 ENCOUNTER — Ambulatory Visit: Payer: BC Managed Care – PPO | Admitting: Family Medicine

## 2020-04-26 NOTE — Telephone Encounter (Signed)
1.  Tell me about your pain and pain management? Pt denies any pain, just some soreness.   2.  Let's talk about fluid intake.  How much total fluid are you taking in? Pt states that she is getting in more than 64oz of fluid including protein shakes, protein water/supplement, and bottled water.   3.  How much protein have you taken in the last 2 days? Pt states she is meeting her goal of 60g of protein each day with the protein shakes and protein water supplement.   4.  Have you had nausea?  Tell me about when have experienced nausea and what you did to help? Pt states that she gets nauseous intermittently when she gets "too full".  Denies vomiting.   5.  Has the frequency or color changed with your urine? Pt states that she is urinating "fine" with no changes in frequency or urgency.     6.  Tell me what your incisions look like? "Incisions look good, just itching, even the one that had the bandage on it". Pt states that [facility] RN has been looking at her incisions and stated that they "are healing good". Pt denies a fever, chills.  Pt states incisions are not swollen, open, or draining.  Pt encouraged to call [facility] if incisions change. Pt instructed not to put any creams or ointments on incisions to alleviate itching.  7.  Have you been passing gas? BM? Pt states that she is having BMs. Last BM 04/25/20.     8.  If a problem or question were to arise who would you call?  Do you know contact numbers for [facility], [facility], and [facility]? Pt states that she has been feeling light-headed when standing up. Pt instructed to change positions slowly.  Per pt HH RN assessed VS and stated that her BP and oxygen sats were good.  Pt is seeing PCP tomorrow for f/u.  Pt can describe s/sx of dehydration.  Pt knows to call [facility] for surgical, [facility] for nutrition, and [facility] for non-urgent questions or concerns.   9.  How has the walking going? Pt states she is walking around and able to be active without difficulty once she stands  and "gets situated".   10.  How are your vitamins and calcium going?  How are you taking them? Pt states that she is taking her supplements and vitamins without difficulty.    Reminded patient that first 30 days post-operatively are important for successful recovery.  Practice good hand hygiene, wearing a mask when appropriate (since optional in most places), and minimizing exposure to people who live outside of the home, especially if they are exhibiting any respiratory, GI, or illness-like symptoms.

## 2020-04-27 ENCOUNTER — Other Ambulatory Visit: Payer: Self-pay

## 2020-04-27 ENCOUNTER — Ambulatory Visit (INDEPENDENT_AMBULATORY_CARE_PROVIDER_SITE_OTHER): Payer: BC Managed Care – PPO | Admitting: Family Medicine

## 2020-04-27 ENCOUNTER — Encounter: Payer: Self-pay | Admitting: Family Medicine

## 2020-04-27 VITALS — BP 132/84 | HR 119 | Temp 98.4°F | Ht 66.0 in | Wt >= 6400 oz

## 2020-04-27 DIAGNOSIS — K5900 Constipation, unspecified: Secondary | ICD-10-CM | POA: Diagnosis not present

## 2020-04-27 DIAGNOSIS — J4521 Mild intermittent asthma with (acute) exacerbation: Secondary | ICD-10-CM

## 2020-04-27 DIAGNOSIS — I10 Essential (primary) hypertension: Secondary | ICD-10-CM

## 2020-04-27 MED ORDER — FLOVENT HFA 110 MCG/ACT IN AERO: 2.0000 | INHALATION_SPRAY | Freq: Two times a day (BID) | RESPIRATORY_TRACT | 1 refills | Status: DC

## 2020-04-27 MED ORDER — LISINOPRIL 20 MG PO TABS: 20.0000 mg | ORAL_TABLET | Freq: Every day | ORAL | 3 refills | Status: DC

## 2020-04-27 NOTE — Patient Instructions (Addendum)
Try MiraLAX 1-2 times daily over the next 3-4 days. If no improvement, try using an enema. Stay well hydrated and keep lots of fiber in your diet.  Try to drink 55-60 oz of water daily outside of exercise.  Keep an eye on your sugars.  Keep an eye on your blood pressures. If it gets to <110/70 consistently, let me know please.   Let us know if you need anything.

## 2020-04-27 NOTE — Progress Notes (Signed)
Chief Complaint  Patient presents with  . Follow-up    Subjective: Patient is a 30 y.o. female here for sob.  On 3/21, the patient had successful bariatric surgery.  She has had intermittent shortness of breath and wheezing since then.  She does have a history of asthma.  She does not have any maintenance inhalers.  No other upper respiratory symptoms.  She is not having fevers and denies any calf pain or close contacts with similar symptoms.  She has been compliant with her Lovenox.  Patient also reports constipation.  This started around a week before her surgery.  She tried milk of magnesia with little success.  She is still passing gas.  No bleeding, nausea, or vomiting.  Past Medical History:  Diagnosis Date  . Anxiety   . Asthma   . Complication of anesthesia    Take for a while to wake up  . Depression   . Diabetes mellitus without complication (HCC)   . Hypertension   . Morbid obesity (HCC)   . Sleep apnea    CPAP  . Tachycardia     Objective: BP 132/84 (BP Location: Left Arm, Patient Position: Sitting, Cuff Size: Normal)   Pulse (!) 119   Temp 98.4 F (36.9 C) (Oral)   Ht 5\' 6"  (1.676 m)   Wt (!) 450 lb 2 oz (204.2 kg)   SpO2 94%   BMI 72.65 kg/m  General: Awake, appears stated age Heart: Heart rate around 84, regular rhythm Lungs: CTAB, no rales, wheezes or rhonchi. No accessory muscle use Abdomen: Bowel sounds present though hypoactive, soft, nontender, nondistended, large panniculus; port sites with some crusting, no erythema, fluctuance, excessive warmth, TTP, or drainage Psych: Age appropriate judgment and insight, normal affect and mood  Assessment and Plan: Mild intermittent asthma with exacerbation - Plan: fluticasone (FLOVENT HFA) 110 MCG/ACT inhaler  Constipation, unspecified constipation type  Essential hypertension  1.  I do not hear any wheezing, will add Flovent.  Continue albuterol. 2.  MiraLAX in addition to milk of magnesia.  Consider an  enema in a few days if no improvement. 3.  Her blood pressures have been low readings and she is losing weight.  We stopped her metoprolol and we will also stop her 12.5 mg of hydrochlorothiazide.  We will continue lisinopril 20 mg daily. The patient voiced understanding and agreement to the plan.  [place], DO 04/27/20  12:13 PM

## 2020-05-01 ENCOUNTER — Telehealth: Payer: Self-pay | Admitting: Skilled Nursing Facility1

## 2020-05-01 ENCOUNTER — Other Ambulatory Visit: Payer: Self-pay

## 2020-05-01 ENCOUNTER — Encounter: Payer: BC Managed Care – PPO | Attending: General Surgery | Admitting: Skilled Nursing Facility1

## 2020-05-01 DIAGNOSIS — E119 Type 2 diabetes mellitus without complications: Secondary | ICD-10-CM | POA: Insufficient documentation

## 2020-05-01 NOTE — Telephone Encounter (Signed)
Called pt back after realizing she takes blood pressures and insulin   Took blood pressure today: 118/78 consistent since surgery  Blood sugar: 98 consistent since surgery   Pt states her oxygen has been low in the past after surgery. States she has been checking her oxygen as well which has been about 90-93.    Call your surgeons office if the issue persists.

## 2020-05-01 NOTE — Telephone Encounter (Signed)
Returned pts call.   Pt state she is experiencing dizziness and nausea since 8am: emesis Saturday or Sunday one event: drank water today 10 ounces and some tuna stating she introduced solid foods 3 days prior to this stating she knows she was not advised too but wanted it. Reports clear urine  Yesterday: 1 greek yogurt, 1 clear protein shake, 8 ounces broth, 40 ounces water, 1 sugar free popcicle, 1 sugar free jello (typical pattern since surgery)   Pt advised if too dizzy not to drive call your surgeon, it does not seem to be dehydration.

## 2020-05-02 NOTE — Progress Notes (Signed)
2 Week Post-Operative Nutrition Class   Patient was seen on 03/23/18 for Post-Operative Nutrition education at [facility]   Pt state she is well and reaching her fluid needs with no dizziness.    Surgery date: 04/16/2020 Surgery type: sleeve Start weight at [facility]: 490 Weight today:   Body Composition Scale 05/01/2020  Current Body Weight 443.5  Total Body Fat % 54.4  Visceral Fat 24  Fat-Free Mass % 45.5   Total Body Water % 37.2  Muscle-Mass lbs 36.5  BMI 71.7  Body Fat Displacement          Torso  lbs 150.1         Left Leg  lbs 30         Right Leg  lbs 30         Left Arm  lbs 15         Right Arm   lbs 15      The following the learning objectives were met by the patient during this course:  Identifies Phase 3 (Soft, High Proteins) Dietary Goals and will begin from 2 weeks post-operatively to 2 months post-operatively  Identifies appropriate sources of fluids and proteins   Identifies appropriate fat sources and healthy verses unhealthy fat types    States protein recommendations and appropriate sources post-operatively  Identifies the need for appropriate texture modifications, mastication, and bite sizes when consuming solids  Identifies appropriate multivitamin and calcium sources post-operatively  Describes the need for physical activity post-operatively and will follow MD recommendations  States when to call healthcare provider regarding medication questions or post-operative complications   Handouts given during class include:  Phase 3A: Soft, High Protein Diet Handout  Phase 3 High Protein Meals  Healthy Fats   Follow-Up Plan: Patient will follow-up at [facility] in 6 weeks for 2 month post-op nutrition visit for diet advancement per MD.

## 2020-05-07 ENCOUNTER — Telehealth: Payer: Self-pay | Admitting: Skilled Nursing Facility1

## 2020-05-07 NOTE — Telephone Encounter (Signed)
RD called pt to verify fluid intake once starting soft, solid proteins 2 week post-bariatric surgery.   Daily Fluid intake: 64 oz Daily Protein intake: 60  Concerns/issues:   None stated.

## 2020-05-21 ENCOUNTER — Telehealth: Payer: Self-pay | Admitting: Family Medicine

## 2020-05-21 ENCOUNTER — Other Ambulatory Visit (HOSPITAL_COMMUNITY): Payer: Self-pay

## 2020-05-21 MED FILL — Ondansetron Orally Disintegrating Tab 4 MG: ORAL | 2 days supply | Qty: 10 | Fill #0 | Status: CN

## 2020-05-21 MED FILL — Pantoprazole Sodium EC Tab 40 MG (Base Equiv): ORAL | 60 days supply | Qty: 60 | Fill #0 | Status: CN

## 2020-05-21 NOTE — Telephone Encounter (Signed)
Supportive care. ER if sob or progressing weakness unable to keep down fluids. Ty.

## 2020-05-21 NOTE — Telephone Encounter (Signed)
She tested at home yesterday and was positive  How long should she quarantine

## 2020-05-21 NOTE — Telephone Encounter (Signed)
Pt states she had weight loss surgery on march 21st 2022 pt tested positive yesterday for Covid she has concern, she like to know if should go to the hospital because she has fever, bodache  And headache  Please advice

## 2020-05-23 ENCOUNTER — Other Ambulatory Visit (HOSPITAL_COMMUNITY): Payer: Self-pay

## 2020-05-29 ENCOUNTER — Encounter: Payer: Self-pay | Admitting: Family Medicine

## 2020-05-29 ENCOUNTER — Other Ambulatory Visit: Payer: Self-pay

## 2020-05-29 ENCOUNTER — Telehealth (INDEPENDENT_AMBULATORY_CARE_PROVIDER_SITE_OTHER): Payer: BC Managed Care – PPO | Admitting: Family Medicine

## 2020-05-29 DIAGNOSIS — U071 COVID-19: Secondary | ICD-10-CM | POA: Diagnosis not present

## 2020-05-29 NOTE — Progress Notes (Signed)
Chief Complaint  Patient presents with  . Covid Positive    Laura Benson here for URI complaints. Due to COVID-19 pandemic, we are interacting via web portal for an electronic face-to-face visit. I verified patient's ID using 2 identifiers. Patient agreed to proceed with visit via this method. Patient is at home, I am at office. Patient and I are present for visit.   Duration: 10 days  Associated symptoms: Fever (100.4 F), sinus congestion, rhinorrhea, shortness of breath, myalgia and coughing Denies: sinus pain, itchy watery eyes, ear pain, ear drainage, sore throat and wheezing Treatment to date: Tylenol, Elderberry, cough drops Sick contacts: No  Tested positive for covid on 05/22/2020. She is vaccinated, had not yet gotten the booster.   Past Medical History:  Diagnosis Date  . Anxiety   . Asthma   . Complication of anesthesia    Take for a while to wake up  . Depression   . Diabetes mellitus without complication (HCC)   . Hypertension   . Morbid obesity (HCC)   . Sleep apnea    CPAP  . Tachycardia    Objective No conversational dyspnea Age appropriate judgment and insight Nml affect and mood  COVID-19  Cont supportive care. Letter for work provided. Once she is fever free for 24 hrs and symptoms resolved, no further need to quarantine.  Continue to push fluids, practice good hand hygiene, cover mouth when coughing. F/u prn. If starting to experience fevers, shaking, or shortness of breath, seek immediate care. Pt voiced understanding and agreement to the plan.  [name] [place], DO 05/29/20 11:14 AM

## 2020-06-13 ENCOUNTER — Ambulatory Visit: Payer: BC Managed Care – PPO | Admitting: Skilled Nursing Facility1

## 2020-06-14 ENCOUNTER — Other Ambulatory Visit: Payer: Self-pay | Admitting: Family Medicine

## 2020-06-14 DIAGNOSIS — I10 Essential (primary) hypertension: Secondary | ICD-10-CM

## 2020-06-19 ENCOUNTER — Ambulatory Visit: Payer: BC Managed Care – PPO | Admitting: Skilled Nursing Facility1

## 2020-06-27 ENCOUNTER — Ambulatory Visit (INDEPENDENT_AMBULATORY_CARE_PROVIDER_SITE_OTHER): Payer: BC Managed Care – PPO | Admitting: Family Medicine

## 2020-06-27 ENCOUNTER — Other Ambulatory Visit: Payer: Self-pay

## 2020-06-27 ENCOUNTER — Encounter: Payer: Self-pay | Admitting: Family Medicine

## 2020-06-27 VITALS — BP 140/98 | HR 88 | Temp 98.7°F | Ht 66.0 in | Wt >= 6400 oz

## 2020-06-27 DIAGNOSIS — E538 Deficiency of other specified B group vitamins: Secondary | ICD-10-CM

## 2020-06-27 DIAGNOSIS — F339 Major depressive disorder, recurrent, unspecified: Secondary | ICD-10-CM

## 2020-06-27 DIAGNOSIS — R5383 Other fatigue: Secondary | ICD-10-CM | POA: Diagnosis not present

## 2020-06-27 DIAGNOSIS — I10 Essential (primary) hypertension: Secondary | ICD-10-CM

## 2020-06-27 DIAGNOSIS — E1165 Type 2 diabetes mellitus with hyperglycemia: Secondary | ICD-10-CM

## 2020-06-27 DIAGNOSIS — Z1159 Encounter for screening for other viral diseases: Secondary | ICD-10-CM | POA: Diagnosis not present

## 2020-06-27 LAB — COMPREHENSIVE METABOLIC PANEL
ALT: 15 U/L (ref 0–35)
AST: 15 U/L (ref 0–37)
Albumin: 4.1 g/dL (ref 3.5–5.2)
Alkaline Phosphatase: 50 U/L (ref 39–117)
BUN: 8 mg/dL (ref 6–23)
CO2: 27 mEq/L (ref 19–32)
Calcium: 9.2 mg/dL (ref 8.4–10.5)
Chloride: 104 mEq/L (ref 96–112)
Creatinine, Ser: 0.77 mg/dL (ref 0.40–1.20)
GFR: 103.45 mL/min (ref >60.00)
Glucose, Bld: 145 mg/dL — ABNORMAL HIGH (ref 70–99)
Potassium: 3.9 mEq/L (ref 3.5–5.1)
Sodium: 139 mEq/L (ref 135–145)
Total Bilirubin: 1.2 mg/dL (ref 0.2–1.2)
Total Protein: 6.8 g/dL (ref 6.0–8.3)

## 2020-06-27 LAB — VITAMIN B12: Vitamin B-12: 320 pg/mL (ref 211–911)

## 2020-06-27 LAB — IBC + FERRITIN
Ferritin: 43.4 ng/mL (ref 10.0–291.0)
Iron: 133 ug/dL (ref 42–145)
Saturation Ratios: 35.4 % (ref 20.0–50.0)
Transferrin: 268 mg/dL (ref 212.0–360.0)

## 2020-06-27 LAB — HEMOGLOBIN A1C: Hgb A1c MFr Bld: 6.9 % — ABNORMAL HIGH (ref 4.6–6.5)

## 2020-06-27 LAB — LIPID PANEL
Cholesterol: 168 mg/dL (ref 0–200)
HDL: 34.9 mg/dL — ABNORMAL LOW (ref >39.00)
LDL Cholesterol: 113 mg/dL — ABNORMAL HIGH (ref 0–99)
NonHDL: 133.01
Total CHOL/HDL Ratio: 5
Triglycerides: 102 mg/dL (ref 0.0–149.0)
VLDL: 20.4 mg/dL (ref 0.0–40.0)

## 2020-06-27 LAB — CBC
HCT: 39.4 % (ref 36.0–46.0)
Hemoglobin: 12.6 g/dL (ref 12.0–15.0)
MCHC: 32.1 g/dL (ref 30.0–36.0)
MCV: 73.4 fl — ABNORMAL LOW (ref 78.0–100.0)
Platelets: 332 10*3/uL (ref 150.0–400.0)
RBC: 5.37 Mil/uL — ABNORMAL HIGH (ref 3.87–5.11)
RDW: 15.9 % — ABNORMAL HIGH (ref 11.5–15.5)
WBC: 4.2 10*3/uL (ref 4.0–10.5)

## 2020-06-27 MED ORDER — BUPROPION HCL ER (XL) 150 MG PO TB24: 150.0000 mg | ORAL_TABLET | Freq: Every day | ORAL | 2 refills | Status: DC

## 2020-06-27 MED ORDER — ESCITALOPRAM OXALATE 20 MG PO TABS: 1.0000 | ORAL_TABLET | Freq: Every day | ORAL | 1 refills | Status: DC

## 2020-06-27 MED ORDER — LISINOPRIL 40 MG PO TABS: 40.0000 mg | ORAL_TABLET | Freq: Every day | ORAL | 1 refills | Status: DC

## 2020-06-27 NOTE — Patient Instructions (Addendum)
Give [place] 2-3 business days to get the results of your labs back.   Keep the diet clean and stay active.  Please consider counseling. Contact [phone] to schedule an appointment or inquire about cost/insurance coverage.  Check your blood pressures 2-3 times per week, alternating the time of day you check it. If it is high, considering waiting 1-2 minutes and rechecking. If it gets higher, your anxiety is likely creeping up and we should avoid rechecking.   Let us know if you need anything.

## 2020-06-27 NOTE — Progress Notes (Signed)
Subjective:   Chief Complaint  Patient presents with  . Follow-up    Laura Benson is a 30 y.o. female here for follow-up of diabetes.   Laura Benson's self monitored glucose range is low 100's.  Patient denies hypoglycemic reactions. She checks her glucose levels 2 time(s) per day. Patient does require insulin.  Has not required mealtime insulin.  Medications include: diet controlled Diet is improved.  Exercise: walking, active at home No CP or SOB.   Hypertension Patient presents for hypertension follow up. She does monitor home blood pressures. Blood pressures ranging on average from 120-140's/70-90's. She is compliant with medication- lisinopril 20 mg/d. Patient has these side effects of medication: none  Depression The patient's depression has gotten worse over the past several weeks.  Unfortunately the pharmacy did not refill her Lexapro 20 mg daily.  This had been helping but her depression had worsened prior to running out.  No homicidal or suicidal ideation.  She is not currently following with a counselor or psychologist.  Past Medical History:  Diagnosis Date  . Anxiety   . Asthma   . Complication of anesthesia    Take for a while to wake up  . Depression   . Diabetes mellitus without complication (HCC)   . Hypertension   . Morbid obesity (HCC)   . Sleep apnea    CPAP  . Tachycardia      Related testing: Retinal exam: Due Pneumovax: done  Objective:  BP (!) 140/98 (BP Location: Left Arm, Patient Position: Sitting, Cuff Size: Large)   Pulse 88   Temp 98.7 F (37.1 C) (Oral)   Ht 5\' 6"  (1.676 m)   Wt (!) 427 lb 4 oz (193.8 kg)   SpO2 95%   BMI 68.96 kg/m  General:  Well developed, well nourished, in no apparent distress Skin:  Warm, no pallor or diaphoresis Head:  Normocephalic, atraumatic Eyes:  Pupils equal and round, sclera anicteric without injection  Lungs:  CTAB, no access msc use Cardio:  RRR, no bruits Psych: Age appropriate judgment and  insight  Assessment:   Type 2 diabetes mellitus with hyperglycemia, without long-term current use of insulin (HCC) - Plan: Comprehensive metabolic panel, Lipid panel, Hemoglobin A1c, Ambulatory referral to Ophthalmology  Essential hypertension  Depression, recurrent (HCC), Chronic - Plan: escitalopram (LEXAPRO) 20 MG tablet  Encounter for hepatitis C screening test for low risk patient - Plan: Hepatitis C antibody  Depression, recurrent (HCC) - Plan: escitalopram (LEXAPRO) 20 MG tablet  Low vitamin B12 level - Plan: B12  Other fatigue - Plan: CBC, IBC + Ferritin   Plan:   1.  Chronic.  We will stop insulin given her weight loss and improvement in sugars.  May add back metformin pending results.  Counseled on diet and exercise. 2.  Chronic, uncontrolled.  Increase dosage of lisinopril from 20 mg daily to 40 mg daily.  Monitor blood pressures at home. 3.  Refill Lexapro 20 mg daily.  Add Wellbutrin 150 mg daily. F/u in 1 mo to reck 2/3. The patient voiced understanding and agreement to the plan.  [place], DO 06/27/20 12:10 PM

## 2020-06-28 ENCOUNTER — Encounter: Payer: BC Managed Care – PPO | Attending: General Surgery | Admitting: Skilled Nursing Facility1

## 2020-06-28 DIAGNOSIS — E119 Type 2 diabetes mellitus without complications: Secondary | ICD-10-CM | POA: Diagnosis present

## 2020-06-28 DIAGNOSIS — E669 Obesity, unspecified: Secondary | ICD-10-CM | POA: Insufficient documentation

## 2020-06-28 LAB — HEPATITIS C ANTIBODY
Hepatitis C Ab: NONREACTIVE
SIGNAL TO CUT-OFF: 0.02 (ref <1.00)

## 2020-06-28 NOTE — Progress Notes (Signed)
Bariatric Nutrition Follow-Up Visit Medical Nutrition Therapy    NUTRITION ASSESSMENT    Anthropometrics  Surgery date: 04/16/2020 Surgery type: sleeve Start weight at [facility]: 490 Weight today: pt delcined   Body Composition Scale 05/01/2020  Current Body Weight 443.5  Total Body Fat % 54.4  Visceral Fat 24  Fat-Free Mass % 45.5   Total Body Water % 37.2  Muscle-Mass lbs 36.5  BMI 71.7  Body Fat Displacement          Torso  lbs 150.1         Left Leg  lbs 30         Right Leg  lbs 30         Left Arm  lbs 15         Right Arm   lbs 15    Clinical  Medical hx: HTN, OSA, DM type 2 Medications:  Off insulin and metformin  Labs: none in EMR   Lifestyle & Dietary Hx  Pt states she has not seen her surgeon because she has had too much going on: Dietitian encouraged pt to make a another appt with her surgeon.   Pt states she was started on Wellbutrin stating her family and friends have not been supportive and broke up with her fiancee and states her grandmother is dieing and has not been sleeping well.  Pt states she has had really low energy and is worried her weight has stalled. Pt states she does not want anybody looking at her.   Pt statea she is interested in doing BELT.  Pt states she wakes with nausea really bad needing to take nausea medicine. Pt states she has been throwing up only when eating chicken.  Pt states she added in fruit because she thought it was good for her  Pt states she feels she is not eating enough so she forces herself to eat. Pt states she works third shift.   Pt states she is able to walk her diet back and follow the phases. Pt states she has been feeling depressed not wanting to get out of bed on some days.   Estimated daily fluid intake: 64+ oz Estimated daily protein intake: 60 g Supplements: multivitamin and calcium  Current average weekly physical activity: ADLs'   24-Hr Dietary Recall First Meal: premier + decaff coffee Snack: 6  berries  Second Meal: protein pasta + tuna Snack: 5 cherries  Third Meal: chicken + non starchy vegetables  Snack: 2 sugar free popcicle + 1 yogurt Beverages: unsweet almond milk, water, coffee, decaff hot tea  Post-Op Goals/ Signs/ Symptoms Using straws: no Drinking while eating: no Chewing/swallowing difficulties: no Changes in vision: no Changes to mood/headaches: no Hair loss/changes to skin/nails: no Difficulty focusing/concentrating: no Sweating: no Dizziness/lightheadedness: no Palpitations: no  Carbonated/caffeinated beverages: no N/V/D/C/Gas: constipation useing mirilax and milk and magnesia  Abdominal pain: no Dumping syndrome: no   NUTRITION DIAGNOSIS  Overweight/obesity ([place]-3.3) related to past poor dietary habits and physical inactivity as evidenced by completed bariatric surgery and following dietary guidelines for continued weight loss and healthy nutrition status.     NUTRITION INTERVENTION Nutrition counseling (C-1) and education (E-2) to facilitate bariatric surgery goals, including: . The importance of consuming adequate calories as well as certain nutrients daily due to the body's need for essential vitamins, minerals, and fats . The importance of daily physical activity and to reach a goal of at least 150 minutes of moderate to vigorous physical activity weekly (or as  directed by their physician) due to benefits such as increased musculature and improved lab values . The importance of intuitive eating specifically learning hunger-satiety cues and understanding the importance of learning a new body: The importance of mindful eating to avoid grazing behaviors   Goals: -Continue to aim for a minimum of 64 fluid ounces 7 days a week with at least 30 ounces being plain water -Eat non-starchy vegetables 2 times a day 7 days a week -Start out with soft cooked vegetables today and tomorrow; if tolerated begin to eat raw vegetables or cooked including salads -Eat your  3 ounces of protein first then start in on your non-starchy vegetables; once you understand how much of your meal leads to satisfaction and not full while still eating 3 ounces of protein and non-starchy vegetables you can eat them in any order  -Continue to aim for 30 minutes of activity at least 5 times a week -Do NOT cook with/add to your food: alfredo sauce, cheese sauce, barbeque sauce, ketchup, fat back, butter, bacon grease, grease, Crisco, OR SUGAR -Add caffeineated coffee -start working with a mental health provider  Handouts Provided Include   BELT flyer  Learning Style & Readiness for Change Teaching method utilized: Visual & Auditory  Demonstrated degree of understanding via: Teach Back  Readiness Level: contemplative Barriers to learning/adherence to lifestyle change: life events  RD's Notes for Next Visit . Assess adherence to pt chosen goals   MONITORING & EVALUATION Dietary intake, weekly physical activity, body weight  Next Steps Patient is to follow-up in 4-6 weeks

## 2020-06-30 NOTE — Progress Notes (Deleted)
12/11/16-30 year old female former smoker for sleep evaluation. NPSG 11/26/16-AHI 20.8/hour, oxygen saturation mean 87%, minimum 64%, body weight 496 pounds Medical problem list includes DM 2, depression, HBP, morbid obesity Complains of not breathing in her sleep, restless and nonrestorative sleep, wakes tired and often with morning headache. ENT surgery-tonsils, septoplasty.  Denies lung or heart problems.  Father uses CPAP. Epworth Score 13  07/02/20- 29 yoF former smoker for sleep evaluation. NPSG 11/26/16-AHI 20.8/hour, oxygen saturation mean 87%, minimum 64%, body weight 496 pounds Medical problem list includes DM 2, depression, HBP, morbid obesity ENT surgery-tonsils, septoplasty Bariatirc surgery 04/18/20- Gastric sleeve- Dr Gaetano Net score- Body weight today- Covid vax-

## 2020-07-02 ENCOUNTER — Telehealth: Payer: Self-pay | Admitting: Skilled Nursing Facility1

## 2020-07-02 ENCOUNTER — Institutional Professional Consult (permissible substitution): Payer: BC Managed Care – PPO | Admitting: Internal Medicine

## 2020-07-02 NOTE — Telephone Encounter (Signed)
That is understandable. At least trying to do protein shakes when you do not have an appetite. If you do not have a therapist you can reach out to you can call [facility] for guidance [phone]  -----Original Message----- From: Vivien Rossetti [email or web address]>  Sent: Monday, July 02, 2020 8:10 AM To: Serena Colonel [email or web address]> Subject: Re: Lost  *Caution - External email - see footer for warnings*  Thank you so much, I been trying to eat but it's like I don't have no appetite at all. It be good to talk with someone.   Sent from my iPhone  > On Jul 02, 2020, at 7:36 AM, Shyenne Maggard [email or web address]> wrote: >  > ?I am so sorry to hear that experiencing a loss is very difficult. It is difficult to focus on your health during times like these but be sure to continue to take care of yourself by drinking your fluids and eating throughout the day. Do you think it would be helpful to talk to someone? >  > -----Original Message----- > From: Vivien Rossetti [email or web address]>  > Sent: Friday, June 29, 2020 9:17 AM > To: Josealfredo Adkins [email or web address]> > Subject: Lost >  > *Caution - External email - see footer for warnings* >  > This morning at 1216a my grandmother has passed away it's hard right now for me I haven't been to sleep and my mind all over the place.  >  > Sent from my iPhone > WARNING: This email originated outside of [facility]. Even if this looks like a [employer], it is not. > Do not provide your username, password, or any other personal information in response to this or any other email. > [facility] will never ask you for your username or password via email. > DO NOT CLICK links or attachments unless you are positive the content is safe. > If in doubt about the safety of this message, select the [employer] Phishing button, which forwards to IT Security. >  >  >  > NOTICE: This  message may contain confidential information intended only for the recipient.  If you have received this communication in error, please notify the sender immediately by replying to the message and deleting it from your computer.  WARNING: This email originated outside of [facility]. Even if this looks like a [employer], it is not. Do not provide your username, password, or any other personal information in response to this or any other email. [facility] will never ask you for your username or password via email. DO NOT CLICK links or attachments unless you are positive the content is safe. If in doubt about the safety of this message, select the [employer] Report Phishing button, which forwards to IT Security.

## 2020-07-05 ENCOUNTER — Telehealth: Payer: Self-pay

## 2020-07-05 NOTE — Telephone Encounter (Signed)
Addressed via MyChart   Patient Name:Laura Benson Gender: Female DOB: Mar 10, 1990 Age: 30 Y 6 M 13 D Return Phone Number: [phone] Client [facility] - Client Client Site [facility] - Day Physician Carmelia Roller, - MD Contact Type Call Who Is Calling Patient / Member / Family / Caregiver Call Type Triage / Clinical Relationship To Patient Self Return Phone Number [phone] (Primary) Chief Complaint Depression Reason for Call Symptomatic / Request for Health Information Initial Comment Caller states she is having some depression. Caller states she has an appointment tomorrow Translation No Nurse Assessment Nurse: Kizzie Bane, RN, Marylene Land Date/Time (Eastern Time): 07/05/2020 9:05:59 AM Confirm and document reason for call. If symptomatic, describe symptoms. ---Caller states she is having depression. She recently had a death in her family and has been under a lot stress. She started Wellbutrin last week but hasn't felt any difference yet. She has a therapy appt. tomorrow. Does the patient have any new or worsening symptoms? ---Yes Will a triage be completed? ---Yes Related visit to physician within the last 2 weeks? ---No Does the PT have any chronic conditions? (i.e. diabetes, asthma, this includes High risk factors for pregnancy, etc.) ---Yes List chronic conditions. ---asthma, hypertension, diabetes Is the patient pregnant or possibly pregnant? (Ask all females between the ages of 26-55) ---No Is this a behavioral health or substance abuse call? ---No Guidelines Guideline Title Affirmed Question Affirmed Notes Nurse Date/Time (Eastern Time) Depression [1] Depression AND [2] worsening (e.g.,sleeping poorly, less able to do activities of daily living) [place], [place], Marylene Land 07/05/2020 9:09:02 A

## 2020-07-06 ENCOUNTER — Telehealth (INDEPENDENT_AMBULATORY_CARE_PROVIDER_SITE_OTHER): Payer: BC Managed Care – PPO | Admitting: Family

## 2020-07-06 ENCOUNTER — Other Ambulatory Visit: Payer: Self-pay

## 2020-07-06 DIAGNOSIS — F4321 Adjustment disorder with depressed mood: Secondary | ICD-10-CM

## 2020-07-06 DIAGNOSIS — F339 Major depressive disorder, recurrent, unspecified: Secondary | ICD-10-CM | POA: Diagnosis not present

## 2020-07-06 NOTE — Progress Notes (Signed)
Laura Benson is a 30 y.o. 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   Encounter for gynecological examination without abnormal finding 02/03/2020   Lumbar radiculopathy 08/17/2019   Type 2 diabetes mellitus with hyperglycemia, without long-term current use of insulin (HCC) 01/18/2019   Anxiety 12/31/2018   Uncomplicated asthma 06/08/2018   Tachycardia    MDD (major depressive disorder), severe (HCC) 05/05/2017   Alcohol abuse 04/30/2017   Umbilical hernia without obstruction and without gangrene 02/25/2017   Obstructive sleep apnea 12/11/2016   Diabetes mellitus without complication (HCC) 01/23/2016   Essential hypertension 01/23/2016   Dysmenorrhea 12/25/2013   Rectal bleeding 10/20/2012   Depression, recurrent (HCC) 01/05/2009   BREAST PAIN, BILATERAL 01/05/2009   INTERTRIGO, CANDIDAL 01/05/2009   Amenorrhea 09/20/2008   DELAYED MENSES 08/25/2008   ASCUS PAP 05/05/2008   Severe obesity (BMI >= 40) (HCC) 03/26/2006   HYPERTENSION, BENIGN SYSTEMIC 03/26/2006    Current Outpatient Medications  Medication Sig Dispense Refill   buPROPion (WELLBUTRIN XL) 150 MG 24 hr tablet Take 1 tablet (150 mg total) by mouth daily. 30 tablet 2   escitalopram (LEXAPRO) 20 MG tablet Take 1 tablet (20 mg total) by mouth daily. 90 tablet 1   fluticasone (FLOVENT HFA) 110 MCG/ACT inhaler Inhale 2 puffs into the lungs in the morning and at bedtime. Rinse mouth out after use. 1 each 1   hydrOXYzine (ATARAX/VISTARIL) 25 MG tablet TAKE 1 TABLET (25 MG TOTAL) BY MOUTH 3 (THREE) TIMES DAILY AS NEEDED FOR ANXIETY. 90 tablet 2   Lancets (ONETOUCH DELICA PLUS LANCET33G) MISC USE DAILY TO CHECK BLOOD SUGAR 100 each 12   lisinopril (ZESTRIL) 40 MG tablet Take 1 tablet (40 mg total) by mouth daily. 30 tablet 1   ondansetron (ZOFRAN-ODT) 4 MG disintegrating tablet DISSOLVE 1 TABLET (4 MG TOTAL)  BY MOUTH EVERY 6 (SIX) HOURS AS NEEDED FOR NAUSEA OR VOMITING. 20 tablet 0   ONETOUCH VERIO test strip USE AS DAILY TO CHECK BLOOD SUGAR. 100 strip 12   albuterol (VENTOLIN HFA) 108 (90 Base) MCG/ACT inhaler Inhale 2 puffs into the lungs every 6 (six) hours as needed for wheezing or shortness of breath. 1 each 2   pantoprazole (PROTONIX) 40 MG tablet TAKE 1 TABLET BY MOUTH ONCE A DAY 90 tablet 0   No current facility-administered medications for this visit.    Allergies: Patient has no known allergies.  Past Medical History:  Diagnosis Date   Anxiety    Asthma    Complication of anesthesia    Take for a while to wake up   Depression    Diabetes mellitus without complication (HCC)    Hypertension    Morbid obesity (HCC)    Sleep apnea    CPAP   Tachycardia     Past Surgical History:  Procedure Laterality Date   ADENOIDECTOMY     LAPAROSCOPIC GASTRIC SLEEVE RESECTION N/A 04/16/2020   Procedure: LAPAROSCOPIC GASTRIC SLEEVE RESECTION;  Surgeon: [name], MD;  Location: [facility];  Service: General;  Laterality: N/A;   TONSILLECTOMY     UPPER GI ENDOSCOPY N/A 04/16/2020   Procedure: UPPER GI ENDOSCOPY;  Surgeon: [name], MD;  Location: [facility];  Service: General;  Laterality: N/A;    Family History  Problem Relation Age of Onset   Hypertension Mother    Diabetes Mother    Colon cancer Father  Hypertension Maternal Grandmother    Hypertension Paternal Grandmother    Diabetes Paternal Grandmother    Breast cancer Cousin     Social History   Tobacco Use   Smoking status: Former    Years: 2.00    Pack years: 0.00    Types: Cigarettes   Smokeless tobacco: Never  Substance Use Topics   Alcohol use: No    Subjective:    I connected with Laura Benson on 07/06/20 at  8:40 AM EDT by a video enabled telemedicine application and verified that I am speaking with the correct person using two identifiers.   I discussed the limitations of evaluation and management by  telemedicine and the availability of in person appointments. The patient expressed understanding and agreed to proceed. Provider in office/ patient is at home; provider and patient are only 2 people on video call.   Patient unexpectedly lost her grandmother last Friday and notes this has been an extremely difficult loss for her; was very close to her grandmother growing up; admits "she is feeling lost"; also still recovering from weight loss surgery and just feels overwhelmed; is on combination of Wellbutrin XL and Lexapro; notes she is not actively suicidal but has been hospitalized with depression in the past and is concerned she could end up in a "dark place" again; would be open to talking to someone and feels this would be very beneficial; notes she is already on medical leave from her job so job is protected at this time;   Objective:  There were no vitals filed for this visit.  General: Well developed, well nourished, in no acute distress  Head: Normocephalic and atraumatic  Lungs: Respirations unlabored;  Neurologic: Alert and oriented; speech intact; face symmetrical;   Assessment:  1. Depression, recurrent (HCC)   2. Grief reaction     Plan:  Patient is not actively suicidal but does recognize that her depression may be getting worse; due to history of hospitalization with depression, she is open to and agrees to go to [facility] today in [place]; she has the address and phone number and understands no appointment is needed; follow up to be determined as far as counseling- she understands they [facility] center can help her get set up with a counselor quickly; she will also need to see her PCP in the next week or so in follow up.  Time spent 30 minutes   No follow-ups on file.  No orders of the defined types were placed in this encounter.   Requested Prescriptions    No prescriptions requested or ordered in this encounter

## 2020-07-11 ENCOUNTER — Encounter: Payer: Self-pay | Admitting: Family Medicine

## 2020-07-11 ENCOUNTER — Ambulatory Visit: Payer: BC Managed Care – PPO | Admitting: Family Medicine

## 2020-07-11 ENCOUNTER — Other Ambulatory Visit: Payer: Self-pay

## 2020-07-11 VITALS — BP 120/80 | HR 83 | Temp 98.2°F | Ht 66.0 in | Wt >= 6400 oz

## 2020-07-11 DIAGNOSIS — Z634 Disappearance and death of family member: Secondary | ICD-10-CM

## 2020-07-11 DIAGNOSIS — F322 Major depressive disorder, single episode, severe without psychotic features: Secondary | ICD-10-CM | POA: Diagnosis not present

## 2020-07-11 MED ORDER — BUPROPION HCL ER (XL) 300 MG PO TB24: 300.0000 mg | ORAL_TABLET | Freq: Every day | ORAL | 2 refills | Status: DC

## 2020-07-11 NOTE — Patient Instructions (Signed)
Take 1/2 tab of the Lexapro for 2 weeks as you restart things.  Take 2 wellbutrin until you run out, I have sent in a new dosage.   Continue with the counseling team.  Aim to do some physical exertion for 150 minutes per week. This is typically divided into 5 days per week, 30 minutes per day. The activity should be enough to get your heart rate up. Anything is better than nothing if you have time constraints.  Coping skills Choose 5 that work for you: Take a deep breath Count to 20 Read a book Do a puzzle Meditate Bake Sing Knit Garden Pray Go outside Call a friend Listen to music Take a walk Color Send a note Take a bath Watch a movie Be alone in a quiet place Pet an animal Visit a friend Journal Exercise Stretch   Let us know if you need anything.

## 2020-07-11 NOTE — Progress Notes (Signed)
Chief Complaint  Patient presents with   Depression    Subjective Laura Benson presents for f/u depression.  Pt is currently being treated with Wellbutrin XL 150 mg/d. She will be restarting Lexapro 20 mg/d today.   Reports doing poorly since her grandmother passed away on 06-30-2020.  No thoughts of harming others. She does have suicidal thoughts. She has no access to deadly weapons or a plan. She has been hospitalized in the past for this.  No self-medication with alcohol, prescription drugs or illicit drugs. Pt is following with a counselor/psychologist.  Past Medical History:  Diagnosis Date   Anxiety    Asthma    Complication of anesthesia    Take for a while to wake up   Depression    Diabetes mellitus without complication (HCC)    Hypertension    Morbid obesity (HCC)    Sleep apnea    CPAP   Tachycardia    Allergies as of 07/11/2020   No Known Allergies      Medication List        Accurate as of July 11, 2020  9:34 AM. If you have any questions, ask your nurse or doctor.          albuterol 108 (90 Base) MCG/ACT inhaler Commonly known as: VENTOLIN HFA Inhale 2 puffs into the lungs every 6 (six) hours as needed for wheezing or shortness of breath.   buPROPion 300 MG 24 hr tablet Commonly known as: Wellbutrin XL Take 1 tablet (300 mg total) by mouth daily. What changed:  medication strength how much to take Changed by: Sharlene Dory, DO   escitalopram 20 MG tablet Commonly known as: LEXAPRO Take 1 tablet (20 mg total) by mouth daily.   Flovent HFA 110 MCG/ACT inhaler Generic drug: fluticasone Inhale 2 puffs into the lungs in the morning and at bedtime. Rinse mouth out after use.   hydrOXYzine 25 MG tablet Commonly known as: ATARAX/VISTARIL TAKE 1 TABLET (25 MG TOTAL) BY MOUTH 3 (THREE) TIMES DAILY AS NEEDED FOR ANXIETY.   lisinopril 40 MG tablet Commonly known as: ZESTRIL Take 1 tablet (40 mg total) by mouth daily.   ondansetron 4 MG  disintegrating tablet Commonly known as: ZOFRAN-ODT DISSOLVE 1 TABLET (4 MG TOTAL) BY MOUTH EVERY 6 (SIX) HOURS AS NEEDED FOR NAUSEA OR VOMITING.   OneTouch Delica Plus Lancet33G Misc USE DAILY TO CHECK BLOOD SUGAR   OneTouch Verio test strip Generic drug: glucose blood USE AS DAILY TO CHECK BLOOD SUGAR.   pantoprazole 40 MG tablet Commonly known as: PROTONIX TAKE 1 TABLET BY MOUTH ONCE A DAY        Exam BP 120/80   Pulse 83   Temp 98.2 F (36.8 C) (Oral)   Ht 5\' 6"  (1.676 m)   Wt (!) 421 lb (191 kg)   SpO2 97%   BMI 67.95 kg/m  General:  well developed, well nourished, in no apparent distress Lungs:  No respiratory distress Psych: tearful during portions of interview; well oriented with normal affect and age-appropriate judgement/insight, alert and oriented x4.  Assessment and Plan  Bereavement - Plan: buPROPion (WELLBUTRIN XL) 300 MG 24 hr tablet  MDD (major depressive disorder), severe (HCC) - Plan: buPROPion (WELLBUTRIN XL) 300 MG 24 hr tablet  Pt reports her depression was a struggle/uncontrolled prior to the death of her grandmother and was made much worse 2 weeks ago. Will add back Lexapro 20 mg/d, take 1/2 tab daily for 2 weeks and then increase  to the full dosage. Cont w counseling team. Anxiety coping techniques given. Going outside and exercising rec'd. Increase dosage of Wellbutrin XL to 300 mg/d from 150 mg/d. Will excuse from work from 6/3-7/4 with return on 7/5.  F/u on 7/1. The patient voiced understanding and agreement to the plan.  [name] [place], DO 07/11/20 9:34 AM

## 2020-07-19 ENCOUNTER — Other Ambulatory Visit: Payer: Self-pay | Admitting: Family Medicine

## 2020-07-27 ENCOUNTER — Ambulatory Visit: Payer: BC Managed Care – PPO | Admitting: Family Medicine

## 2020-08-03 ENCOUNTER — Other Ambulatory Visit: Payer: Self-pay

## 2020-08-03 ENCOUNTER — Encounter: Payer: Self-pay | Admitting: Family Medicine

## 2020-08-03 ENCOUNTER — Ambulatory Visit (INDEPENDENT_AMBULATORY_CARE_PROVIDER_SITE_OTHER): Payer: BC Managed Care – PPO | Admitting: Family Medicine

## 2020-08-03 VITALS — BP 138/88 | HR 96 | Temp 98.4°F | Ht 66.0 in | Wt >= 6400 oz

## 2020-08-03 DIAGNOSIS — K429 Umbilical hernia without obstruction or gangrene: Secondary | ICD-10-CM | POA: Diagnosis not present

## 2020-08-03 DIAGNOSIS — R55 Syncope and collapse: Secondary | ICD-10-CM | POA: Diagnosis not present

## 2020-08-03 DIAGNOSIS — Z634 Disappearance and death of family member: Secondary | ICD-10-CM | POA: Diagnosis not present

## 2020-08-03 DIAGNOSIS — F4321 Adjustment disorder with depressed mood: Secondary | ICD-10-CM | POA: Diagnosis not present

## 2020-08-03 NOTE — Patient Instructions (Addendum)
Give [place] 2-3 business days to get the results of your labs back.  If things are normal, we will refer you to a cardiologist.   Get me paperwork for your job please.  Let us know if you need anything.

## 2020-08-03 NOTE — Progress Notes (Signed)
ekg 

## 2020-08-03 NOTE — Progress Notes (Addendum)
Chief Complaint  Patient presents with   Follow-up    Subjective: Patient is a 30 y.o. female here for f/u.  The patient's grandmother passed away around 1 month ago.  She was started back on Lexapro 20 mg daily in addition to having her bupropion XL increased from 150 mg to 300 mg daily.  She reports compliance and no adverse effects.  She reports significant improvement.  No homicidal or suicidal ideation.  No self-medication.  Yesterday, the patient was lifting at work and nearly passed out.  Things turned dark and she felt sweaty.  She did not notice any palpitations.  She would like a lifting restriction and she also has an umbilical hernia that will hurt more when she lifts heavier things.  Past Medical History:  Diagnosis Date   Anxiety    Asthma    Complication of anesthesia    Take for a while to wake up   Depression    Diabetes mellitus without complication (HCC)    Hypertension    Morbid obesity (HCC)    Sleep apnea    CPAP   Tachycardia     Objective: BP 138/88   Pulse 96   Temp 98.4 F (36.9 C) (Oral)   Ht 5\' 6"  (1.676 m)   Wt (!) 412 lb (186.9 kg)   SpO2 97%   BMI 66.50 kg/m  General: Awake, appears stated age HEENT: MMM, EOMi Heart: RRR, no murmurs, no bruits, no lower extremity edema Abdomen: Bowel sounds present, umbilical hernia noted without excessive warmth, fluctuance, or TTP Lungs: CTAB, no rales, wheezes or rhonchi. No accessory muscle use Psych: Age appropriate judgment and insight, normal affect and mood  Assessment and Plan: Situational depression  Bereavement  Pre-syncope - Plan: EKG 12-Lead, CBC, Comprehensive metabolic panel, T4, free, TSH, CANCELED: CBC, CANCELED: Comprehensive metabolic panel, CANCELED: TSH, CANCELED: T4, free  Umbilical hernia without obstruction and without gangrene  1/2. Continue Wellbutrin XL 300 mg daily, Lexapro 20 mg daily.  Continue with therapy.  She does not wish to make any changes. 3. Check labs, EKG  unremarkable.  If labs are normal, will refer to cardiology.  I think it is appropriate to allow her a 20 pound lifting restriction for both #3 and this pending further work-up. 4. She will get surgery to repair this is soon as possible, she had bariatric surgery earlier in the year preventing her from having this done in 2022. I would like to see her in December for physical. The patient voiced understanding and agreement to the plan.  January Gattman, DO 08/08/20  12:06 PM

## 2020-08-04 LAB — CBC
HCT: 42.5 % (ref 35.0–45.0)
Hemoglobin: 12.8 g/dL (ref 11.7–15.5)
MCH: 22.9 pg — ABNORMAL LOW (ref 27.0–33.0)
MCHC: 30.1 g/dL — ABNORMAL LOW (ref 32.0–36.0)
MCV: 76 fL — ABNORMAL LOW (ref 80.0–100.0)
MPV: 10.7 fL (ref 7.5–12.5)
Platelets: 327 10*3/uL (ref 140–400)
RBC: 5.59 10*6/uL — ABNORMAL HIGH (ref 3.80–5.10)
RDW: 14.7 % (ref 11.0–15.0)
WBC: 5.9 10*3/uL (ref 3.8–10.8)

## 2020-08-04 LAB — COMPREHENSIVE METABOLIC PANEL
AG Ratio: 1.4 (calc) (ref 1.0–2.5)
ALT: 21 U/L (ref 6–29)
AST: 16 U/L (ref 10–30)
Albumin: 4.1 g/dL (ref 3.6–5.1)
Alkaline phosphatase (APISO): 63 U/L (ref 31–125)
BUN: 9 mg/dL (ref 7–25)
CO2: 27 mmol/L (ref 20–32)
Calcium: 9.3 mg/dL (ref 8.6–10.2)
Chloride: 103 mmol/L (ref 98–110)
Creat: 0.76 mg/dL (ref 0.50–1.10)
Globulin: 2.9 g/dL (calc) (ref 1.9–3.7)
Glucose, Bld: 123 mg/dL — ABNORMAL HIGH (ref 65–99)
Potassium: 4 mmol/L (ref 3.5–5.3)
Sodium: 140 mmol/L (ref 135–146)
Total Bilirubin: 0.7 mg/dL (ref 0.2–1.2)
Total Protein: 7 g/dL (ref 6.1–8.1)

## 2020-08-04 LAB — T4, FREE: Free T4: 1.2 ng/dL (ref 0.8–1.8)

## 2020-08-04 LAB — TSH: TSH: 2.5 mIU/L

## 2020-08-05 ENCOUNTER — Other Ambulatory Visit: Payer: Self-pay | Admitting: Family Medicine

## 2020-08-06 ENCOUNTER — Other Ambulatory Visit: Payer: Self-pay | Admitting: Family Medicine

## 2020-08-06 DIAGNOSIS — R55 Syncope and collapse: Secondary | ICD-10-CM

## 2020-08-06 NOTE — Progress Notes (Signed)
e

## 2020-08-07 ENCOUNTER — Encounter: Payer: Self-pay | Admitting: Family Medicine

## 2020-08-07 LAB — TEST AUTHORIZATION: TEST CODE:: 5616

## 2020-08-07 LAB — IRON,TIBC AND FERRITIN PANEL
%SAT: 23 % (calc) (ref 16–45)
Ferritin: 42 ng/mL (ref 16–154)
Iron: 70 ug/dL (ref 40–190)
TIBC: 299 mcg/dL (calc) (ref 250–450)

## 2020-08-09 ENCOUNTER — Other Ambulatory Visit: Payer: Self-pay | Admitting: Family Medicine

## 2020-08-09 ENCOUNTER — Ambulatory Visit: Payer: BC Managed Care – PPO | Admitting: Skilled Nursing Facility1

## 2020-08-09 DIAGNOSIS — F322 Major depressive disorder, single episode, severe without psychotic features: Secondary | ICD-10-CM

## 2020-08-09 DIAGNOSIS — Z634 Disappearance and death of family member: Secondary | ICD-10-CM

## 2020-08-11 ENCOUNTER — Other Ambulatory Visit: Payer: Self-pay | Admitting: Family Medicine

## 2020-08-19 ENCOUNTER — Other Ambulatory Visit: Payer: Self-pay | Admitting: Family Medicine

## 2020-09-04 ENCOUNTER — Other Ambulatory Visit: Payer: Self-pay | Admitting: Family Medicine

## 2020-09-04 DIAGNOSIS — T8859XA Other complications of anesthesia, initial encounter: Secondary | ICD-10-CM | POA: Insufficient documentation

## 2020-09-05 ENCOUNTER — Ambulatory Visit: Payer: BC Managed Care – PPO | Admitting: Cardiology

## 2020-09-07 ENCOUNTER — Other Ambulatory Visit: Payer: Self-pay

## 2020-09-07 ENCOUNTER — Emergency Department (HOSPITAL_BASED_OUTPATIENT_CLINIC_OR_DEPARTMENT_OTHER): Payer: BC Managed Care – PPO

## 2020-09-07 ENCOUNTER — Emergency Department (HOSPITAL_BASED_OUTPATIENT_CLINIC_OR_DEPARTMENT_OTHER)
Admission: EM | Admit: 2020-09-07 | Discharge: 2020-09-07 | Disposition: A | Discharge status: Home / Self Care | Payer: BC Managed Care – PPO | Attending: Emergency Medicine | Admitting: Emergency Medicine

## 2020-09-07 ENCOUNTER — Encounter (HOSPITAL_BASED_OUTPATIENT_CLINIC_OR_DEPARTMENT_OTHER): Payer: Self-pay | Admitting: Emergency Medicine

## 2020-09-07 DIAGNOSIS — R519 Headache, unspecified: Secondary | ICD-10-CM | POA: Diagnosis not present

## 2020-09-07 DIAGNOSIS — Z7951 Long term (current) use of inhaled steroids: Secondary | ICD-10-CM | POA: Insufficient documentation

## 2020-09-07 DIAGNOSIS — M791 Myalgia, unspecified site: Secondary | ICD-10-CM | POA: Insufficient documentation

## 2020-09-07 DIAGNOSIS — R5381 Other malaise: Secondary | ICD-10-CM | POA: Diagnosis not present

## 2020-09-07 DIAGNOSIS — Z79899 Other long term (current) drug therapy: Secondary | ICD-10-CM | POA: Insufficient documentation

## 2020-09-07 DIAGNOSIS — R112 Nausea with vomiting, unspecified: Secondary | ICD-10-CM | POA: Insufficient documentation

## 2020-09-07 DIAGNOSIS — E119 Type 2 diabetes mellitus without complications: Secondary | ICD-10-CM | POA: Insufficient documentation

## 2020-09-07 DIAGNOSIS — Z87891 Personal history of nicotine dependence: Secondary | ICD-10-CM | POA: Diagnosis not present

## 2020-09-07 DIAGNOSIS — R1032 Left lower quadrant pain: Secondary | ICD-10-CM | POA: Insufficient documentation

## 2020-09-07 DIAGNOSIS — J45909 Unspecified asthma, uncomplicated: Secondary | ICD-10-CM | POA: Diagnosis not present

## 2020-09-07 DIAGNOSIS — I10 Essential (primary) hypertension: Secondary | ICD-10-CM | POA: Diagnosis not present

## 2020-09-07 DIAGNOSIS — R109 Unspecified abdominal pain: Secondary | ICD-10-CM | POA: Diagnosis present

## 2020-09-07 LAB — CBC WITH DIFFERENTIAL/PLATELET
Abs Immature Granulocytes: 0.01 10*3/uL (ref 0.00–0.07)
Basophils Absolute: 0 10*3/uL (ref 0.0–0.1)
Basophils Relative: 1 %
Eosinophils Absolute: 0.1 10*3/uL (ref 0.0–0.5)
Eosinophils Relative: 2 %
HCT: 40.6 % (ref 36.0–46.0)
Hemoglobin: 12.9 g/dL (ref 12.0–15.0)
Immature Granulocytes: 0 %
Lymphocytes Relative: 42 %
Lymphs Abs: 1.7 10*3/uL (ref 0.7–4.0)
MCH: 23.7 pg — ABNORMAL LOW (ref 26.0–34.0)
MCHC: 31.8 g/dL (ref 30.0–36.0)
MCV: 74.5 fL — ABNORMAL LOW (ref 80.0–100.0)
Monocytes Absolute: 0.4 10*3/uL (ref 0.1–1.0)
Monocytes Relative: 11 %
Neutro Abs: 1.8 10*3/uL (ref 1.7–7.7)
Neutrophils Relative %: 44 %
Platelets: 278 10*3/uL (ref 150–400)
RBC: 5.45 MIL/uL — ABNORMAL HIGH (ref 3.87–5.11)
RDW: 14.8 % (ref 11.5–15.5)
WBC: 4 10*3/uL (ref 4.0–10.5)
nRBC: 0 % (ref 0.0–0.2)

## 2020-09-07 LAB — COMPREHENSIVE METABOLIC PANEL
ALT: 18 U/L (ref 0–44)
AST: 18 U/L (ref 15–41)
Albumin: 4 g/dL (ref 3.5–5.0)
Alkaline Phosphatase: 59 U/L (ref 38–126)
Anion gap: 7 (ref 5–15)
BUN: 8 mg/dL (ref 6–20)
CO2: 27 mmol/L (ref 22–32)
Calcium: 9 mg/dL (ref 8.9–10.3)
Chloride: 106 mmol/L (ref 98–111)
Creatinine, Ser: 0.75 mg/dL (ref 0.44–1.00)
GFR, Estimated: >60 mL/min (ref >60)
Glucose, Bld: 125 mg/dL — ABNORMAL HIGH (ref 70–99)
Potassium: 3.9 mmol/L (ref 3.5–5.1)
Sodium: 140 mmol/L (ref 135–145)
Total Bilirubin: 1.2 mg/dL (ref 0.3–1.2)
Total Protein: 7.2 g/dL (ref 6.5–8.1)

## 2020-09-07 LAB — URINALYSIS, ROUTINE W REFLEX MICROSCOPIC
Glucose, UA: NEGATIVE mg/dL
Hgb urine dipstick: NEGATIVE
Ketones, ur: NEGATIVE mg/dL
Leukocytes,Ua: NEGATIVE
Nitrite: NEGATIVE
Protein, ur: NEGATIVE mg/dL
Specific Gravity, Urine: 1.025 (ref 1.005–1.030)
pH: 6 (ref 5.0–8.0)

## 2020-09-07 LAB — PREGNANCY, URINE: Preg Test, Ur: NEGATIVE

## 2020-09-07 MED ORDER — IOHEXOL 300 MG/ML  SOLN
125.0000 mL | Freq: Once | INTRAMUSCULAR | Status: AC | PRN
  Administered 2020-09-07: 125 mL via INTRAVENOUS

## 2020-09-07 NOTE — ED Triage Notes (Signed)
Pt states has been feeling bad ( nausea headache cramps)for past three weeks. States had sleeve preformed in march feels like some symptoms could be due to that or possible pregnancy.

## 2020-09-07 NOTE — ED Provider Notes (Signed)
[facility] Provider Note: Laura Benson Disha Cottam, MD, FACEP  CSN: [id number] MRN: [id number] ARRIVAL: 09/07/20 at 0129 ROOM: MH07/[facility]07   CHIEF COMPLAINT  Abdominal Pain   HISTORY OF PRESENT ILLNESS  09/07/20 2:02 AM Laura Benson is a 30 y.o. female who has been feeling ill for the past 3 weeks.  Specifically she has had general malaise, body aches, headache and nausea.  She has also had occasional vomiting after eating.  She had gastric sleeve surgery in March of this year and does not know if this is related to that.  Since yesterday she has had a pain in her left lower abdomen almost around to the mid axillary line.  This pain is a sharp pain and she rates it as a 6 out of 10.  It is worse with movement or palpation.  She has had no change in bowel movements.  She thinks she may have had a positive pregnancy test recently.   Past Medical History:  Diagnosis Date   Anxiety    Asthma    Complication of anesthesia    Take for a while to wake up   Depression    Diabetes mellitus without complication (HCC)    Hypertension    Morbid obesity (HCC)    Sleep apnea    CPAP   Tachycardia     Past Surgical History:  Procedure Laterality Date   ADENOIDECTOMY     LAPAROSCOPIC GASTRIC SLEEVE RESECTION N/A 04/16/2020   Procedure: LAPAROSCOPIC GASTRIC SLEEVE RESECTION;  Surgeon: Gaynelle AduWilson, Eric, MD;  Location: [facility];  Service: General;  Laterality: N/A;   TONSILLECTOMY     UPPER GI ENDOSCOPY N/A 04/16/2020   Procedure: UPPER GI ENDOSCOPY;  Surgeon: Gaynelle AduWilson, Eric, MD;  Location: [facility];  Service: General;  Laterality: N/A;    Family History  Problem Relation Age of Onset   Hypertension Mother    Diabetes Mother    Colon cancer Father    Hypertension Maternal Grandmother    Hypertension Paternal Grandmother    Diabetes Paternal Grandmother    Breast cancer Cousin     Social History   Tobacco Use   Smoking status: Former    Years: 2.00    Types: Cigarettes   Smokeless  tobacco: Never  Vaping Use   Vaping Use: Never used  Substance Use Topics   Alcohol use: No   Drug use: No    Prior to Admission medications   Medication Sig Start Date End Date Taking? Authorizing Provider  buPROPion (WELLBUTRIN XL) 300 MG 24 hr tablet TAKE 1 TABLET BY MOUTH EVERY DAY 08/09/20   Sharlene DoryWendling, Nicholas Paul, DO  escitalopram (LEXAPRO) 20 MG tablet Take 1 tablet (20 mg total) by mouth daily. 06/27/20   Sharlene DoryWendling, Nicholas Paul, DO  fluticasone (FLOVENT HFA) 110 MCG/ACT inhaler Inhale 2 puffs into the lungs in the morning and at bedtime. Rinse mouth out after use. 04/27/20   Sharlene DoryWendling, Nicholas Paul, DO  hydrOXYzine (ATARAX/VISTARIL) 25 MG tablet TAKE 1 TABLET (25 MG TOTAL) BY MOUTH 3 (THREE) TIMES DAILY AS NEEDED FOR ANXIETY. 08/06/20   Wendling, Jilda RocheNicholas Paul, DO  Lancets (ONETOUCH DELICA PLUS CamancheLANCET33G) MISC USE DAILY TO CHECK BLOOD SUGAR 01/30/20   Sharlene DoryWendling, Nicholas Paul, DO  lisinopril (ZESTRIL) 40 MG tablet TAKE 1 TABLET BY MOUTH EVERY DAY 09/04/20   Wendling, Jilda RocheNicholas Paul, DO  ondansetron (ZOFRAN-ODT) 4 MG disintegrating tablet DISSOLVE 1 TABLET (4 MG TOTAL) BY MOUTH EVERY 6 (SIX) HOURS AS NEEDED FOR NAUSEA OR VOMITING. 04/18/20  04/18/21  [name], MD  [facility] VERIO test strip USE AS DAILY TO CHECK BLOOD SUGAR. 01/30/20   Sharlene Dory, DO    Allergies Patient has no known allergies.   REVIEW OF SYSTEMS  Negative except as noted here or in the History of Present Illness.   PHYSICAL EXAMINATION  Initial Vital Signs Blood pressure (!) 154/107, pulse 95, temperature 98.9 F (37.2 C), temperature source Oral, resp. rate 18, height 5\' 6"  (1.676 m), weight (!) 186 kg, last menstrual period 07/17/2020, SpO2 100 %.  Examination General: Well-developed, high BMI female in no acute distress; appearance consistent with age of record HENT: normocephalic; atraumatic Eyes: pupils equal, round and reactive to light; extraocular muscles intact Neck: supple Heart: regular rate  and rhythm Lungs: clear to auscultation bilaterally Abdomen: soft; obese; left lateral lower abdominal tenderness; bowel sounds present Extremities: No deformity; full range of motion; pulses normal Neurologic: Awake, alert and oriented; motor function intact in all extremities and symmetric; no facial droop Skin: Warm and dry Psychiatric: Normal mood and affect   RESULTS  Summary of this visit's results, reviewed and interpreted by myself:   EKG Interpretation  Date/Time:    Ventricular Rate:    PR Interval:    QRS Duration:   QT Interval:    QTC Calculation:   R Axis:     Text Interpretation:         Laboratory Studies: Results for orders placed or performed during the hospital encounter of 09/07/20 (from the past 24 hour(s))  CBC with Differential/Platelet     Status: Abnormal   Collection Time: 09/07/20  2:45 AM  Result Value Ref Range   WBC 4.0 4.0 - 10.5 K/uL   RBC 5.45 (H) 3.87 - 5.11 MIL/uL   Hemoglobin 12.9 12.0 - 15.0 g/dL   HCT 11/07/20 32.6 - 71.2 %   MCV 74.5 (L) 80.0 - 100.0 fL   MCH 23.7 (L) 26.0 - 34.0 pg   MCHC 31.8 30.0 - 36.0 g/dL   RDW 45.8 09.9 - 83.3 %   Platelets 278 150 - 400 K/uL   nRBC 0.0 0.0 - 0.2 %   Neutrophils Relative % 44 %   Neutro Abs 1.8 1.7 - 7.7 K/uL   Lymphocytes Relative 42 %   Lymphs Abs 1.7 0.7 - 4.0 K/uL   Monocytes Relative 11 %   Monocytes Absolute 0.4 0.1 - 1.0 K/uL   Eosinophils Relative 2 %   Eosinophils Absolute 0.1 0.0 - 0.5 K/uL   Basophils Relative 1 %   Basophils Absolute 0.0 0.0 - 0.1 K/uL   Immature Granulocytes 0 %   Abs Immature Granulocytes 0.01 0.00 - 0.07 K/uL  Comprehensive metabolic panel     Status: Abnormal   Collection Time: 09/07/20  2:45 AM  Result Value Ref Range   Sodium 140 135 - 145 mmol/L   Potassium 3.9 3.5 - 5.1 mmol/L   Chloride 106 98 - 111 mmol/L   CO2 27 22 - 32 mmol/L   Glucose, Bld 125 (H) 70 - 99 mg/dL   BUN 8 6 - 20 mg/dL   Creatinine, Ser 11/07/20 0.44 - 1.00 mg/dL   Calcium 9.0  8.9 - 0.53 mg/dL   Total Protein 7.2 6.5 - 8.1 g/dL   Albumin 4.0 3.5 - 5.0 g/dL   AST 18 15 - 41 U/L   ALT 18 0 - 44 U/L   Alkaline Phosphatase 59 38 - 126 U/L   Total Bilirubin 1.2 0.3 -  1.2 mg/dL   GFR, Estimated >97 >67 mL/min   Anion gap 7 5 - 15  Urinalysis, Routine w reflex microscopic Urine, Clean Catch     Status: Abnormal   Collection Time: 09/07/20  2:45 AM  Result Value Ref Range   Color, Urine YELLOW YELLOW   APPearance CLOUDY (A) CLEAR   Specific Gravity, Urine 1.025 1.005 - 1.030   pH 6.0 5.0 - 8.0   Glucose, UA NEGATIVE NEGATIVE mg/dL   Hgb urine dipstick NEGATIVE NEGATIVE   Bilirubin Urine SMALL (A) NEGATIVE   Ketones, ur NEGATIVE NEGATIVE mg/dL   Protein, ur NEGATIVE NEGATIVE mg/dL   Nitrite NEGATIVE NEGATIVE   Leukocytes,Ua NEGATIVE NEGATIVE  Pregnancy, urine     Status: None   Collection Time: 09/07/20  2:45 AM  Result Value Ref Range   Preg Test, Ur NEGATIVE NEGATIVE   Imaging Studies: CT ABDOMEN PELVIS WO CONTRAST  Result Date: 09/07/2020 CLINICAL DATA:  30 year old female with left lower quadrant pain, cramping and nausea. Prior gastric sleeve. Study with IV contrast attempted but per the technologist's notes the intravenous access extravasated. EXAM: CT ABDOMEN AND PELVIS WITHOUT CONTRAST TECHNIQUE: Multidetector CT imaging of the abdomen and pelvis was performed following the standard protocol without IV contrast. COMPARISON:  CT Abdomen and Pelvis 02/21/2017. FINDINGS: Lower chest: Negative. Hepatobiliary: Negative noncontrast liver and gallbladder. Pancreas: Negative. Spleen: Negative. Adrenals/Urinary Tract: Normal adrenal glands. Small volume of IV contrast being excreted to nondilated renal collecting systems and ureters. Small volume excreted IV contrast in the urinary bladder. No nephrolithiasis or abnormality is evident. Stomach/Bowel: Minimal diverticula at the junction of the descending and sigmoid colon in the left lower quadrant. No active  inflammation. And otherwise negative large bowel. Normal appendix on coronal image 57. Decompressed and negative terminal ileum. No dilated small bowel. Stomach is decompressed, with new postoperative changes along the greater curvature since 2019. Negative duodenum. No free air, free fluid or mesenteric inflammation. Stable small fat containing umbilical hernia. Vascular/Lymphatic: Normal caliber abdominal aorta. No calcified atherosclerosis. No lymphadenopathy. Reproductive: Negative noncontrast appearance. Other: No pelvic free fluid. Musculoskeletal: Mild sclerotic L5 inferior endplate degeneration has developed since 2019. No acute osseous abnormality identified. IMPRESSION: No acute or inflammatory process identified in the abdomen or pelvis. Minimal diverticula at the junction of the descending and sigmoid colon segments with no active inflammation. Postoperative changes to the stomach since 2019 with no adverse features. Predominantly noncontrast exam, but there is a small volume of IV contrast being excreted from both kidneys to the bladder. Electronically Signed   By: Odessa Fleming M.D.   On: 09/07/2020 04:50    ED COURSE and MDM  Nursing notes, initial and subsequent vitals signs, including pulse oximetry, reviewed and interpreted by myself.  Vitals:   09/07/20 0137 09/07/20 0141 09/07/20 0300  BP:  (!) 154/107 (!) 134/91  Pulse:  95 63  Resp:  18 16  Temp:  98.9 F (37.2 C)   TempSrc:  Oral   SpO2:  100% 100%  Weight: (!) 186 kg    Height: 5\' 6"  (1.676 m)     Medications  iohexol (OMNIPAQUE) 300 MG/ML solution 125 mL (125 mLs Intravenous Contrast Given 09/07/20 0341)   4:56 AM Patient advised of reassuring diagnostic studies.  The cause of her malaise and of her left lower quadrant pain is unclear.   PROCEDURES  Procedures   ED DIAGNOSES     ICD-10-CM   1. Malaise  R53.81     2. LLQ  abdominal pain  R10.32          Paula Libra, MD 09/07/20 [phone]

## 2020-09-09 ENCOUNTER — Other Ambulatory Visit: Payer: Self-pay | Admitting: Family Medicine

## 2020-09-24 ENCOUNTER — Other Ambulatory Visit: Payer: Self-pay

## 2020-09-24 ENCOUNTER — Encounter: Payer: Self-pay | Admitting: Family Medicine

## 2020-09-24 ENCOUNTER — Ambulatory Visit (INDEPENDENT_AMBULATORY_CARE_PROVIDER_SITE_OTHER): Payer: BC Managed Care – PPO | Admitting: Family Medicine

## 2020-09-24 VITALS — BP 134/88 | HR 83 | Temp 98.4°F | Ht 66.0 in | Wt >= 6400 oz

## 2020-09-24 DIAGNOSIS — Z3201 Encounter for pregnancy test, result positive: Secondary | ICD-10-CM | POA: Diagnosis not present

## 2020-09-24 DIAGNOSIS — F411 Generalized anxiety disorder: Secondary | ICD-10-CM | POA: Diagnosis not present

## 2020-09-24 DIAGNOSIS — F339 Major depressive disorder, recurrent, unspecified: Secondary | ICD-10-CM | POA: Diagnosis not present

## 2020-09-24 MED ORDER — QUETIAPINE FUMARATE 25 MG PO TABS: 25.0000 mg | ORAL_TABLET | Freq: Every day | ORAL | 2 refills | Status: DC

## 2020-09-24 NOTE — Progress Notes (Addendum)
Chief Complaint  Patient presents with   Insomnia    Anxiety     Subjective Laura Benson presents for f/u anxiety/depression.  Pt is currently being treated with Lexapro 20 mg/d, Wellbutrin XL 300 mg/d, hydroxyzine prn.  Reports insomnia, mentally disconnected, irritable, poor concentration and depression continuing. No thoughts of harming self or others. No self-medication with alcohol, prescription drugs or illicit drugs. Pt is following with a counselor/psychologist.  Past Medical History:  Diagnosis Date   Anxiety    Asthma    Complication of anesthesia    Take for a while to wake up   Depression    Diabetes mellitus without complication (HCC)    Hypertension    Morbid obesity (HCC)    Sleep apnea    CPAP   Tachycardia    Allergies as of 09/24/2020   No Known Allergies      Medication List        Accurate as of September 24, 2020  9:39 AM. If you have any questions, ask your nurse or doctor.          STOP taking these medications    ondansetron 4 MG disintegrating tablet Commonly known as: ZOFRAN-ODT Stopped by: Sharlene Dory, DO       TAKE these medications    buPROPion 300 MG 24 hr tablet Commonly known as: WELLBUTRIN XL TAKE 1 TABLET BY MOUTH EVERY DAY   escitalopram 20 MG tablet Commonly known as: LEXAPRO Take 1 tablet (20 mg total) by mouth daily.   Flovent HFA 110 MCG/ACT inhaler Generic drug: fluticasone Inhale 2 puffs into the lungs in the morning and at bedtime. Rinse mouth out after use.   hydrOXYzine 25 MG tablet Commonly known as: ATARAX/VISTARIL TAKE 1 TABLET BY MOUTH 3 TIMES DAILY AS NEEDED FOR ANXIETY.   lisinopril 40 MG tablet Commonly known as: ZESTRIL TAKE 1 TABLET BY MOUTH EVERY DAY   OneTouch Delica Plus Lancet33G Misc USE DAILY TO CHECK BLOOD SUGAR   OneTouch Verio test strip Generic drug: glucose blood USE AS DAILY TO CHECK BLOOD SUGAR.   QUEtiapine 25 MG tablet Commonly known as: SEROquel Take 1  tablet (25 mg total) by mouth at bedtime. Started by: Sharlene Dory, DO        Exam BP 134/88   Pulse 83   Temp 98.4 F (36.9 C) (Oral)   Ht 5\' 6"  (1.676 m)   Wt (!) 418 lb 4 oz (189.7 kg)   SpO2 96%   BMI 67.51 kg/m  General:  well developed, well nourished, in no apparent distress Lungs:  No respiratory distress Psych: well oriented with normal range of affect and age-appropriate judgement/insight, alert and oriented x4. Did become visibly frustrated during exam, some word finding difficulty.   Assessment and Plan  Depression, recurrent (HCC) - Plan: QUEtiapine (SEROQUEL) 25 MG tablet, Ambulatory referral to Psychiatry  GAD (generalized anxiety disorder) - Plan: QUEtiapine (SEROQUEL) 25 MG tablet, Ambulatory referral to Psychiatry  Positive pregnancy test - Plan: hCG, serum, qualitative  Chronic, uncontrolled. Cont Wellbutrin XL 300 mg/d, Lexapro 20 mg/d, Hydroxyzine prn. Cont walking. Add Seroquel 25 mg/d. Will need to get set up w psychiatry again as a contingency. Could consider Risperdal vs anti-epileptic stabilizer if no improvement.  F/u in 1 mo. The patient voiced understanding and agreement to the plan.  I spent 45 min with the patient discussing the above in addition to reviewing her chart and filling out paperwork on the same day of the visit.  Laura Benson DISH, DO 09/24/20 9:39 AM

## 2020-09-24 NOTE — Patient Instructions (Signed)
Aim to do some physical exertion for 150 minutes per week. This is typically divided into 5 days per week, 30 minutes per day. The activity should be enough to get your heart rate up. Anything is better than nothing if you have time constraints.  Coping skills Choose 5 that work for you: Take a deep breath Count to 20 Read a book Do a puzzle Meditate Bake Sing Knit Garden Pray Go outside Call a friend Listen to music Take a walk Color Send a note Take a bath Watch a movie Be alone in a quiet place Pet an animal Visit a friend Journal Exercise Stretch   [facility] [address] [place], [place] [address] [phone]  [facility] [address] [place], [place] [address] [phone]  [facility] [address] [place], [place] [address] [phone]  [facility] Medicine [address], [place], [place], [phone] [address], [place], [place], #[phone]  [facility] [address] [place], [place] [address] [phone]  [facility] [address] [place], [place] [phone]  [facility] Department [address] [place], [place] [phone]  Call one of these offices sooner than later as it can take 2-3 months to get a new patient appointment.

## 2020-09-24 NOTE — Addendum Note (Signed)
Addended by: Radene Gunning on: 09/24/2020 04:53 PM   Modules accepted: Level of Service

## 2020-09-25 LAB — HCG, SERUM, QUALITATIVE: Preg, Serum: NEGATIVE

## 2020-10-22 ENCOUNTER — Other Ambulatory Visit: Payer: Self-pay | Admitting: Family Medicine

## 2020-10-22 DIAGNOSIS — F339 Major depressive disorder, recurrent, unspecified: Secondary | ICD-10-CM

## 2020-10-22 DIAGNOSIS — F411 Generalized anxiety disorder: Secondary | ICD-10-CM

## 2020-10-23 ENCOUNTER — Ambulatory Visit: Payer: BC Managed Care – PPO | Admitting: Family Medicine

## 2020-10-23 ENCOUNTER — Other Ambulatory Visit: Payer: Self-pay

## 2020-10-23 ENCOUNTER — Encounter: Payer: Self-pay | Admitting: Family Medicine

## 2020-10-23 VITALS — BP 138/88 | HR 94 | Temp 99.1°F | Ht 66.0 in | Wt >= 6400 oz

## 2020-10-23 DIAGNOSIS — F411 Generalized anxiety disorder: Secondary | ICD-10-CM | POA: Diagnosis not present

## 2020-10-23 DIAGNOSIS — F322 Major depressive disorder, single episode, severe without psychotic features: Secondary | ICD-10-CM | POA: Diagnosis not present

## 2020-10-23 MED ORDER — ARIPIPRAZOLE 5 MG PO TABS: 5.0000 mg | ORAL_TABLET | Freq: Every day | ORAL | 2 refills | Status: DC

## 2020-10-23 NOTE — Patient Instructions (Signed)
Keep the diet clean and stay active.  Please call the following resources today for a psychiatrist.   [facility] [address] [place], [place] [address] [phone]  [facility] Behavior Health [address] [place], [place] [address] [phone]  [facility] health [address] [place], [place] [address] [phone]  [facility] Medicine [address], [place], [place], [phone] [address], [place], [place], #[phone]  [facility] [address] [place], [place] [address] [phone]  [facility] Psych[facility] and Counseling [address] [place], [place] [phone]  [facility] [address] [place], [place] [phone]  Call one of these offices sooner than later as it can take 2-3 months to get a new patient appointment.

## 2020-10-23 NOTE — Progress Notes (Signed)
Chief Complaint  Patient presents with   Follow-up    Subjective Laura Benson presents for f/u anxiety/depression.  Pt is currently being treated with most recently Seroquel 25 mg qhs, Lexapro 20 mg/d, Wellbutrin XL 300 mg/d.  Reports some improvement since treatment. She is having more fatigue/sleepiness since starting Seroquel.  No thoughts of harming self or others. No self-medication with alcohol, prescription drugs or illicit drugs. Pt is not following with a counselor/psychologist. She did not contact any of the psychiatry resources provided for her at the last visit.   Past Medical History:  Diagnosis Date   Anxiety    Asthma    Complication of anesthesia    Take for a while to wake up   Depression    Diabetes mellitus without complication (HCC)    Hypertension    Morbid obesity (HCC)    Sleep apnea    CPAP   Tachycardia    Allergies as of 10/23/2020   No Known Allergies      Medication List        Accurate as of October 23, 2020 10:30 AM. If you have any questions, ask your nurse or doctor.          STOP taking these medications    QUEtiapine 25 MG tablet Commonly known as: SEROQUEL Stopped by: Sharlene Dory, DO       TAKE these medications    ARIPiprazole 5 MG tablet Commonly known as: Abilify Take 1 tablet (5 mg total) by mouth daily. Started by: Sharlene Dory, DO   buPROPion 300 MG 24 hr tablet Commonly known as: WELLBUTRIN XL TAKE 1 TABLET BY MOUTH EVERY DAY   escitalopram 20 MG tablet Commonly known as: LEXAPRO Take 1 tablet (20 mg total) by mouth daily.   Flovent HFA 110 MCG/ACT inhaler Generic drug: fluticasone Inhale 2 puffs into the lungs in the morning and at bedtime. Rinse mouth out after use.   hydrOXYzine 25 MG tablet Commonly known as: ATARAX/VISTARIL TAKE 1 TABLET BY MOUTH 3 TIMES DAILY AS NEEDED FOR ANXIETY.   lisinopril 40 MG tablet Commonly known as: ZESTRIL TAKE 1 TABLET BY MOUTH EVERY  DAY   OneTouch Delica Plus Lancet33G Misc USE DAILY TO CHECK BLOOD SUGAR   OneTouch Verio test strip Generic drug: glucose blood USE AS DAILY TO CHECK BLOOD SUGAR.        Exam BP 138/88   Pulse 94   Temp 99.1 F (37.3 C) (Oral)   Ht 5\' 6"  (1.676 m)   Wt (!) 407 lb 4 oz (184.7 kg)   SpO2 99%   BMI 65.73 kg/m  General:  well developed, well nourished, in no apparent distress Lungs:  No respiratory distress Psych: well oriented with normal range of affect and age-appropriate judgement/insight, alert and oriented x4.  Assessment and Plan  MDD (major depressive disorder), severe (HCC) - Plan: ARIPiprazole (ABILIFY) 5 MG tablet  GAD (generalized anxiety disorder) - Plan: ARIPiprazole (ABILIFY) 5 MG tablet  Chronic, AE from medication. Stop Seroquel, start Abilify 5 mg/d. Cont Lexapro 20 mg/d, Wellbutrin XL 300 mg/d. Gave psych resources again to contact.  F/u in 1 mo to reck. The patient voiced understanding and agreement to the plan.  [place], DO 10/23/20 10:30 AM

## 2020-10-24 ENCOUNTER — Other Ambulatory Visit: Payer: Self-pay | Admitting: Family Medicine

## 2020-10-24 DIAGNOSIS — F339 Major depressive disorder, recurrent, unspecified: Secondary | ICD-10-CM

## 2020-10-24 DIAGNOSIS — F411 Generalized anxiety disorder: Secondary | ICD-10-CM

## 2020-10-24 DIAGNOSIS — F322 Major depressive disorder, single episode, severe without psychotic features: Secondary | ICD-10-CM

## 2020-10-24 NOTE — Progress Notes (Signed)
re

## 2020-10-29 ENCOUNTER — Ambulatory Visit: Payer: BC Managed Care – PPO | Admitting: Cardiology

## 2020-11-16 ENCOUNTER — Other Ambulatory Visit: Payer: Self-pay | Admitting: Family Medicine

## 2020-11-16 DIAGNOSIS — F322 Major depressive disorder, single episode, severe without psychotic features: Secondary | ICD-10-CM

## 2020-11-16 DIAGNOSIS — F411 Generalized anxiety disorder: Secondary | ICD-10-CM

## 2020-11-27 ENCOUNTER — Ambulatory Visit: Payer: BC Managed Care – PPO | Admitting: Family Medicine

## 2020-11-27 ENCOUNTER — Encounter: Payer: Self-pay | Admitting: Family Medicine

## 2021-02-08 ENCOUNTER — Encounter (HOSPITAL_COMMUNITY): Payer: Self-pay | Admitting: *Deleted

## 2021-06-08 IMAGING — DX DG CHEST 2V
2 series · 2 of 2 positions shown · non-contrast
Comparison: 07/06/2018

CLINICAL DATA: Shortness of breath following recent bariatric
surgery

EXAM:
CHEST - 2 VIEW

[chest lat]
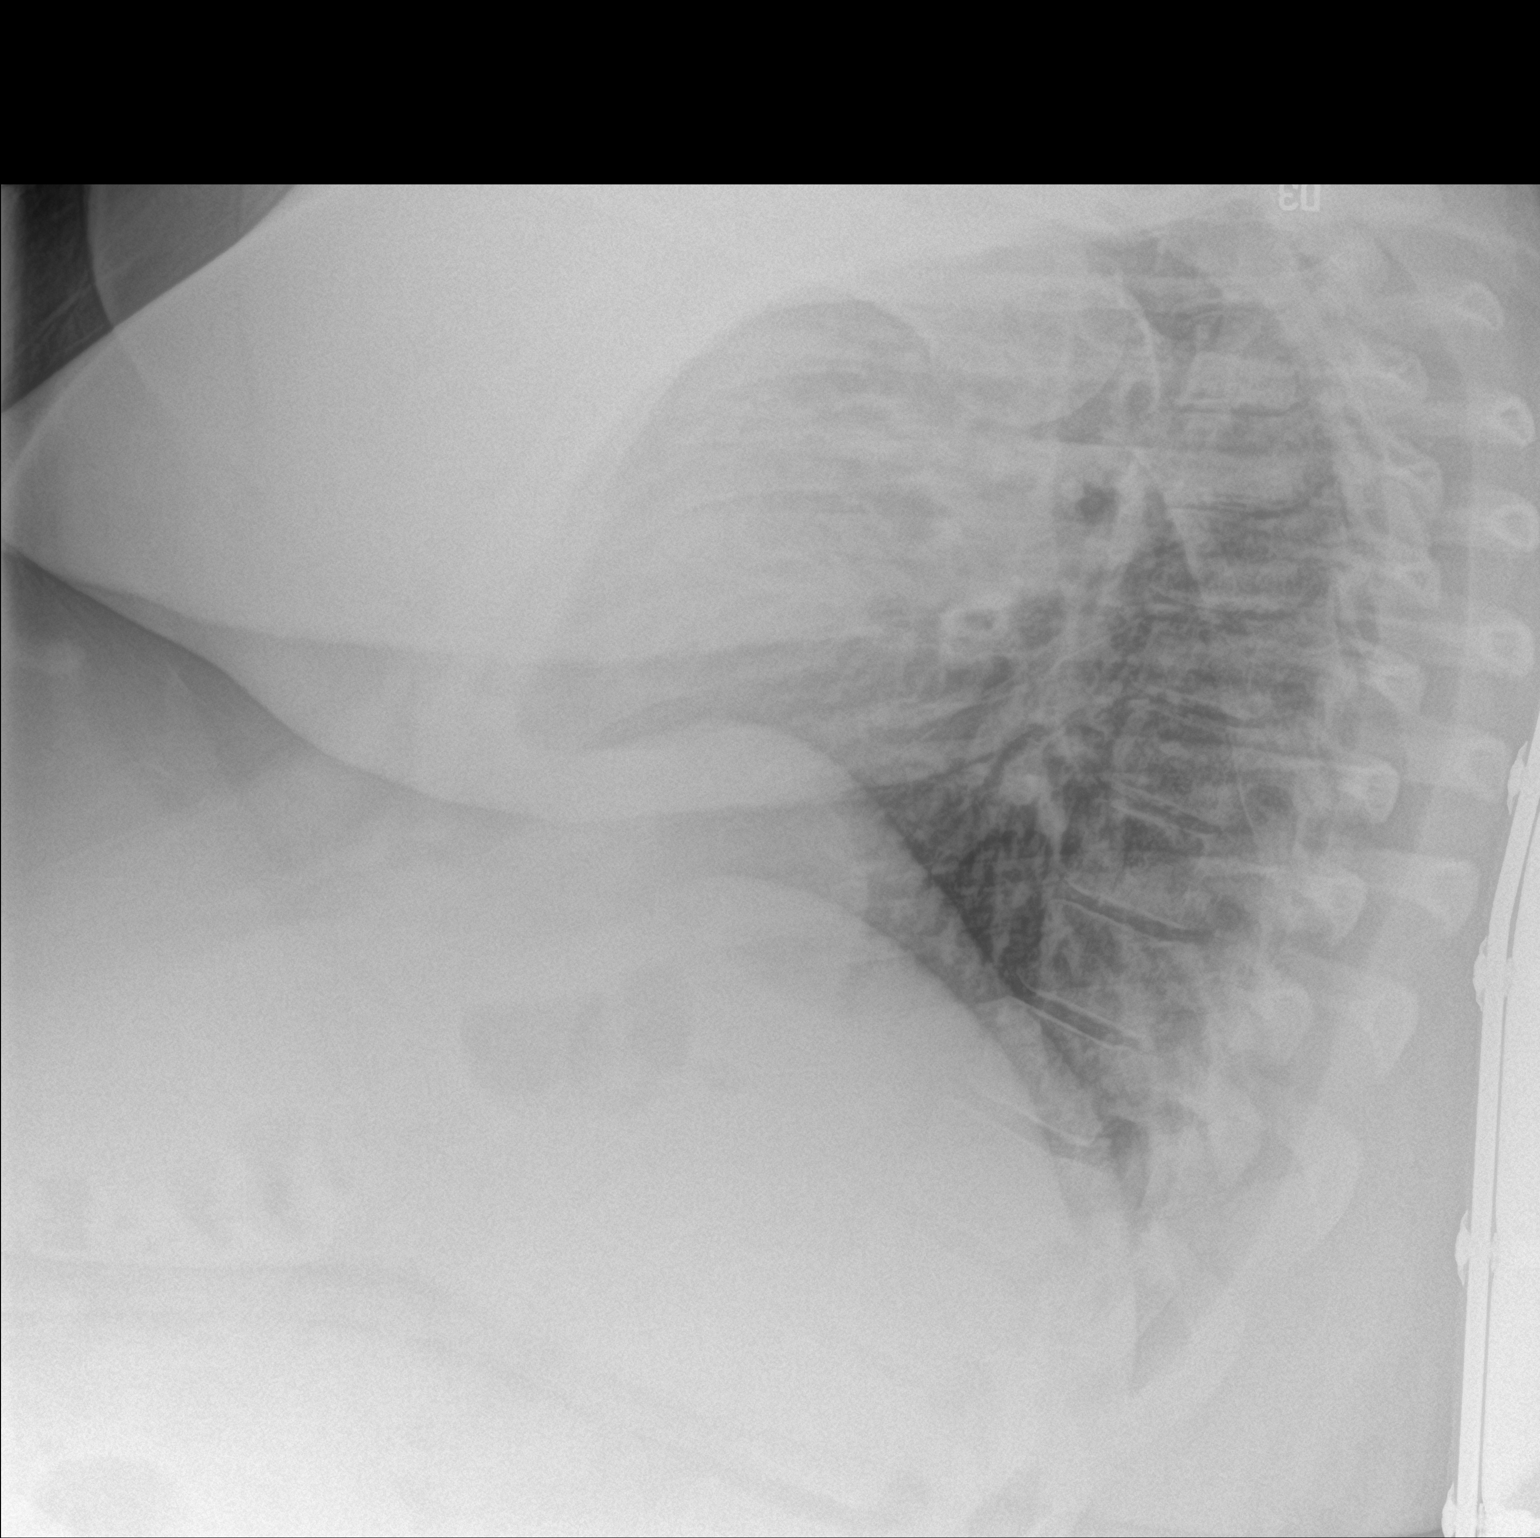

[chest ap w/grid]
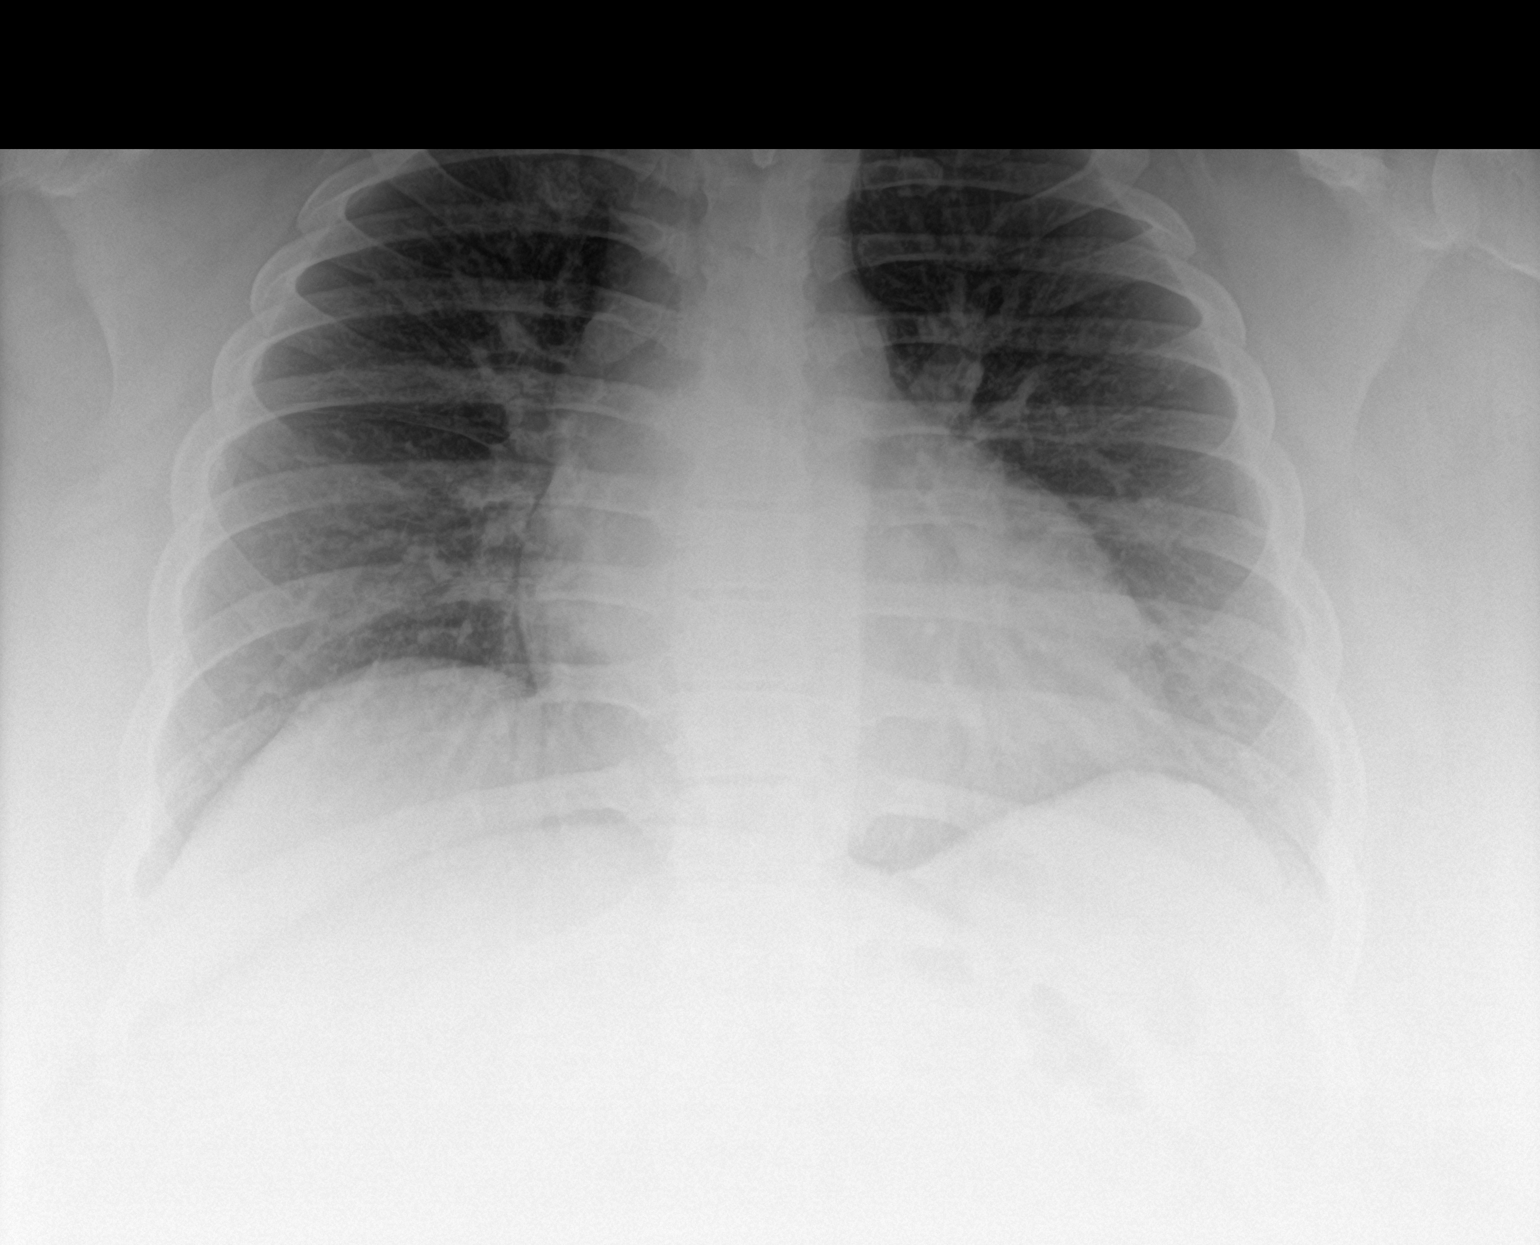

[2 of 2 positions shown; findings below may reference images not displayed]

FINDINGS: The heart size and mediastinal contours are within normal limits.
Both lungs are clear. The visualized skeletal structures are
unremarkable.
IMPRESSION: No active cardiopulmonary disease.

## 2021-07-01 ENCOUNTER — Ambulatory Visit: Payer: BC Managed Care – PPO | Admitting: Family Medicine

## 2021-07-01 ENCOUNTER — Encounter: Payer: Self-pay | Admitting: Family Medicine

## 2021-07-01 VITALS — BP 114/78 | HR 78 | Temp 98.9°F | Ht 66.0 in | Wt 398.2 lb

## 2021-07-01 DIAGNOSIS — F411 Generalized anxiety disorder: Secondary | ICD-10-CM | POA: Diagnosis not present

## 2021-07-01 DIAGNOSIS — E1165 Type 2 diabetes mellitus with hyperglycemia: Secondary | ICD-10-CM

## 2021-07-01 DIAGNOSIS — F339 Major depressive disorder, recurrent, unspecified: Secondary | ICD-10-CM

## 2021-07-01 LAB — MICROALBUMIN / CREATININE URINE RATIO
Creatinine,U: 239.7 mg/dL
Microalb Creat Ratio: 0.8 mg/g (ref 0.0–30.0)
Microalb, Ur: 1.9 mg/dL (ref 0.0–1.9)

## 2021-07-01 MED ORDER — ESCITALOPRAM OXALATE 10 MG PO TABS: 10.0000 mg | ORAL_TABLET | Freq: Every day | ORAL | 1 refills | Status: DC

## 2021-07-01 NOTE — Patient Instructions (Addendum)
Please schedule your eye exam.   Give [place] 2-3 business days to get the results of your labs back.   Wear a wrist cock up splint at night and during aggravating activities.   Keep the diet clean and stay active.  Take 1/2 tab daily for the first 2 weeks and then increase to 1 tab daily.   Let us know if you need anything.  [facility] [address] [place], [place] [address] [phone]  [facility] [address] [place], [place] [address] [phone]  [facility] health [address] [place], [place] [address] [phone]  [facility] Medicine [address], [place], [place], [phone] [address], [place], [place], [phone]  [facility] [address] [place], [place] [address] [phone]  [facility] and Counseling [address] [place], [place] [phone]  [facility] [address] [place], [place] [phone]  [facility] [address] [place], [place]   [phone].   Please go online to complete a form to submit first.  [facility]  [address] [place], [place] [phone].     [facility] -  local practices located at: [address] [place], [place]. [phone]. [address] [place], [place], [place].  [phone]. [address], [place], [place].  [phone].  Contact one of these offices sooner than later as it can take 2-3 months to get a new patient appointment.

## 2021-07-01 NOTE — Progress Notes (Signed)
Chief Complaint  Patient presents with   mental health   Weight Loss    Birth control Off all medications since January 2023    Subjective Laura Benson presents for f/u anxiety/depression.  Pt is currently being treated with nothing.  Reports doing poorly since coming off of treatment. She quit her job and lost her insurance and stopped her Wellbutrin 300 mg daily, Lexapro 20 mg daily, and Abilify 5 mg daily.   She never set up with psychiatry due to this also. Some thoughts of harming others, not herself, usually when she gets worked up. No self-medication with alcohol, prescription drugs or illicit drugs. Pt is not following with a counselor/psychologist.  DM Diet controlled. Has lost 100 lbs since having bariatric surgery. She does not check her sugars routinely. Diet improved. Exercising is limited, exercises at home.  She is due for an eye exam.  She is up-to-date with pneumonia vaccine.  Past Medical History:  Diagnosis Date   Anxiety    Asthma    Complication of anesthesia    Take for a while to wake up   Depression    Diabetes mellitus without complication ([place])    Hypertension    Morbid obesity ([place])    Sleep apnea    CPAP   Tachycardia    Allergies as of 07/01/2021   No Known Allergies      Medication List        Accurate as of July 01, 2021  1:33 PM. If you have any questions, ask your nurse or doctor.          STOP taking these medications    ARIPiprazole 5 MG tablet Commonly known as: ABILIFY Stopped by: Shelda Pal, DO   buPROPion 300 MG 24 hr tablet Commonly known as: WELLBUTRIN XL Stopped by: Shelda Pal, DO   Flovent HFA 110 MCG/ACT inhaler Generic drug: fluticasone Stopped by: Shelda Pal, DO   hydrOXYzine 25 MG tablet Commonly known as: ATARAX Stopped by: Shelda Pal, DO   lisinopril 40 MG tablet Commonly known as: ZESTRIL Stopped by: Shelda Pal, DO       TAKE these  medications    escitalopram 10 MG tablet Commonly known as: Lexapro Take 1 tablet (10 mg total) by mouth daily. What changed:  medication strength how much to take Changed by: Shelda Pal, DO   OneTouch Delica Plus 0000000 Misc USE DAILY TO CHECK BLOOD SUGAR   OneTouch Verio test strip Generic drug: glucose blood USE AS DAILY TO CHECK BLOOD SUGAR.        Exam BP 114/78   Pulse 78   Temp 98.9 F (37.2 C) (Oral)   Ht 5\' 6"  (1.676 m)   Wt (!) 398 lb 4 oz (180.6 kg)   SpO2 98%   BMI 64.28 kg/m  General:  well developed, well nourished, in no apparent distress Heart: RRR, no bruits, no lower extremity edema, DP pulses 2+ bilaterally Skin: No external lesions noted on the feet Neuro: Sensation blunted to pinprick on the right foot, unable to discern any pinprick on the left foot Lungs: Clear to auscultation bilaterally.  No respiratory distress Psych: well oriented with normal range of affect and age-appropriate judgement/insight, alert and oriented x4.  Assessment and Plan  GAD (generalized anxiety disorder) - Plan: escitalopram (LEXAPRO) 10 MG tablet  Depression, recurrent ([place]) - Plan: escitalopram (LEXAPRO) 10 MG tablet  Type 2 diabetes mellitus with hyperglycemia, without long-term current use of insulin (  [place]) - Plan: Hemoglobin A1c, Microalbumin / creatinine urine ratio, Lipid panel, Comprehensive metabolic panel, CBC  1/2.  Chronic, unstable.  Restart Lexapro 5 mg daily for 2 weeks and then increase to 10 mg daily.  Psychiatry information provided.  I will see her for this in 1 month to recheck. 3.  Chronic, unsure if stable.  For now continue diet control.  Blood pressure looks good, okay to stay off of lisinopril.  Could consider Mounjaro to help with weight loss if A1c is not less than 7. The patient voiced understanding and agreement to the plan.  Glide, DO 07/01/21 1:33 PM

## 2021-07-02 ENCOUNTER — Encounter: Payer: Self-pay | Admitting: Family Medicine

## 2021-07-02 LAB — COMPREHENSIVE METABOLIC PANEL
ALT: 9 U/L (ref 0–35)
AST: 12 U/L (ref 0–37)
Albumin: 4 g/dL (ref 3.5–5.2)
Alkaline Phosphatase: 52 U/L (ref 39–117)
BUN: 9 mg/dL (ref 6–23)
CO2: 27 mEq/L (ref 19–32)
Calcium: 9.1 mg/dL (ref 8.4–10.5)
Chloride: 105 mEq/L (ref 96–112)
Creatinine, Ser: 0.78 mg/dL (ref 0.40–1.20)
GFR: 101.14 mL/min (ref >60.00)
Glucose, Bld: 99 mg/dL (ref 70–99)
Potassium: 4.1 mEq/L (ref 3.5–5.1)
Sodium: 139 mEq/L (ref 135–145)
Total Bilirubin: 1 mg/dL (ref 0.2–1.2)
Total Protein: 6.7 g/dL (ref 6.0–8.3)

## 2021-07-02 LAB — CBC
HCT: 35.8 % — ABNORMAL LOW (ref 36.0–46.0)
Hemoglobin: 11.4 g/dL — ABNORMAL LOW (ref 12.0–15.0)
MCHC: 31.8 g/dL (ref 30.0–36.0)
MCV: 74.8 fl — ABNORMAL LOW (ref 78.0–100.0)
Platelets: 281 10*3/uL (ref 150.0–400.0)
RBC: 4.79 Mil/uL (ref 3.87–5.11)
RDW: 14.3 % (ref 11.5–15.5)
WBC: 4.8 10*3/uL (ref 4.0–10.5)

## 2021-07-02 LAB — LIPID PANEL
Cholesterol: 168 mg/dL (ref 0–200)
HDL: 42.9 mg/dL (ref >39.00)
LDL Cholesterol: 107 mg/dL — ABNORMAL HIGH (ref 0–99)
NonHDL: 125.06
Total CHOL/HDL Ratio: 4
Triglycerides: 90 mg/dL (ref 0.0–149.0)
VLDL: 18 mg/dL (ref 0.0–40.0)

## 2021-07-02 LAB — HEMOGLOBIN A1C: Hgb A1c MFr Bld: 6.5 % (ref 4.6–6.5)

## 2021-07-03 ENCOUNTER — Other Ambulatory Visit: Payer: Self-pay | Admitting: Family Medicine

## 2021-07-03 MED ORDER — NORETHIN ACE-ETH ESTRAD-FE 1-20 MG-MCG PO TABS: 1.0000 | ORAL_TABLET | Freq: Every day | ORAL | 11 refills | Status: DC

## 2021-07-08 ENCOUNTER — Emergency Department (HOSPITAL_COMMUNITY): Payer: BC Managed Care – PPO

## 2021-07-08 ENCOUNTER — Emergency Department (HOSPITAL_COMMUNITY)
Admission: EM | Admit: 2021-07-08 | Discharge: 2021-07-08 | Disposition: A | Discharge status: Home / Self Care | Payer: BC Managed Care – PPO | Attending: Emergency Medicine | Admitting: Emergency Medicine

## 2021-07-08 ENCOUNTER — Other Ambulatory Visit: Payer: Self-pay

## 2021-07-08 ENCOUNTER — Encounter (HOSPITAL_COMMUNITY): Payer: Self-pay

## 2021-07-08 DIAGNOSIS — K5900 Constipation, unspecified: Secondary | ICD-10-CM | POA: Diagnosis not present

## 2021-07-08 DIAGNOSIS — R109 Unspecified abdominal pain: Secondary | ICD-10-CM | POA: Diagnosis present

## 2021-07-08 LAB — HCG, QUANTITATIVE, PREGNANCY: hCG, Beta Chain, Quant, S: <1 m[IU]/mL (ref <5)

## 2021-07-08 MED ORDER — OXYCODONE-ACETAMINOPHEN 5-325 MG PO TABS
2.0000 | ORAL_TABLET | Freq: Once | ORAL | Status: AC
  Administered 2021-07-08: 2 via ORAL
  Filled 2021-07-08: qty 2

## 2021-07-08 NOTE — ED Provider Notes (Signed)
[facility]-EMERGENCY DEPT Provider Note   CSN: [id number] Arrival date & time: 07/08/21  0256     History  Chief Complaint  Patient presents with   Hernia    Laura Benson is a 31 y.o. female.  31 year old female presents with abdominal wall discomfort.  No associated fever or emesis.  Has abdominal wall hernia which needs to be repaired.  States pain is sharp and worse with the kind of movement.  No treatment use prior to arrival       Home Medications Prior to Admission medications   Medication Sig Start Date End Date Taking? Authorizing Provider  escitalopram (LEXAPRO) 10 MG tablet Take 1 tablet (10 mg total) by mouth daily. 07/01/21   Wendling, [name], DO  Lancets (ONETOUCH DELICA PLUS [place]) MISC USE DAILY TO CHECK BLOOD SUGAR 01/30/20   Sharlene Dory, DO  norethindrone-ethinyl estradiol-FE (LOESTRIN FE) 1-20 MG-MCG tablet Take 1 tablet by mouth daily. 07/03/21   Wendling, [name], DO  ONETOUCH VERIO test strip USE AS DAILY TO CHECK BLOOD SUGAR. 01/30/20   Sharlene Dory, DO      Allergies    Patient has no known allergies.    Review of Systems   Review of Systems  All other systems reviewed and are negative.   Physical Exam Updated Vital Signs BP (!) 174/98 (BP Location: Right Arm)   Pulse 72   Temp 97.9 F (36.6 C) (Oral)   Resp 18   Ht 1.676 m (5\' 6" )   Wt (!) 180.6 kg   SpO2 100%   BMI 64.28 kg/m  Physical Exam Vitals and nursing note reviewed.  Constitutional:      General: She is not in acute distress.    Appearance: Normal appearance. She is well-developed. She is not toxic-appearing.  HENT:     Head: Normocephalic and atraumatic.  Eyes:     General: Lids are normal.     Conjunctiva/sclera: Conjunctivae normal.     Pupils: Pupils are equal, round, and reactive to light.  Neck:     Thyroid: No thyroid mass.     Trachea: No tracheal deviation.  Cardiovascular:     Rate and Rhythm: Normal  rate and regular rhythm.     Heart sounds: Normal heart sounds. No murmur heard.    No gallop.  Pulmonary:     Effort: Pulmonary effort is normal. No respiratory distress.     Breath sounds: Normal breath sounds. No stridor. No decreased breath sounds, wheezing, rhonchi or rales.  Abdominal:     General: There is no distension.     Palpations: Abdomen is soft.     Tenderness: There is no abdominal tenderness. There is no rebound.    Musculoskeletal:        General: No tenderness. Normal range of motion.     Cervical back: Normal range of motion and neck supple.  Skin:    General: Skin is warm and dry.     Findings: No abrasion or rash.  Neurological:     Mental Status: She is alert and oriented to person, place, and time. Mental status is at baseline.     GCS: GCS eye subscore is 4. GCS verbal subscore is 5. GCS motor subscore is 6.     Cranial Nerves: No cranial nerve deficit.     Sensory: No sensory deficit.     Motor: Motor function is intact.  Psychiatric:        Attention and Perception:  Attention normal.        Speech: Speech normal.        Behavior: Behavior normal.     ED Results / Procedures / Treatments   Labs (all labs ordered are listed, but only abnormal results are displayed) Labs Reviewed - No data to display  EKG None  Radiology No results found.  Procedures Procedures    Medications Ordered in ED Medications  oxyCODONE-acetaminophen (PERCOCET/ROXICET) 5-325 MG per tablet 2 tablet (has no administration in time range)    ED Course/ Medical Decision Making/ A&P                           Medical Decision Making Amount and/or Complexity of Data Reviewed Labs: ordered. Radiology: ordered.  Risk Prescription drug management.   Had acute abdominal series was per interpretation shows constipation.  Suspect this is etiology of the patient's symptoms.  She has had no fever or emesis.  Low suspicion for serious intra-abdominal process.  Plan will  be for patient to go home and follow-up with [place] surgery        Final Clinical Impression(s) / ED Diagnoses Final diagnoses:  None    Rx / DC Orders ED Discharge Orders     None         Lorre Nick, MD 07/08/21 1230

## 2021-07-08 NOTE — ED Triage Notes (Addendum)
Pt BIB EMS from home with c/o abdominal pain extending from belly button down to pelvic area. Pt has hx of hernia and states pain is same, states she needs to have it operated on but has not had insurance. 20 g LAC, 20 mcg fentanyl given en route.

## 2021-07-21 ENCOUNTER — Encounter: Payer: Self-pay | Admitting: Family Medicine

## 2021-07-25 ENCOUNTER — Other Ambulatory Visit: Payer: Self-pay | Admitting: Family Medicine

## 2021-07-25 DIAGNOSIS — F339 Major depressive disorder, recurrent, unspecified: Secondary | ICD-10-CM

## 2021-07-25 DIAGNOSIS — F411 Generalized anxiety disorder: Secondary | ICD-10-CM

## 2021-07-26 ENCOUNTER — Emergency Department (HOSPITAL_COMMUNITY)
Admission: EM | Admit: 2021-07-26 | Discharge: 2021-07-26 | Disposition: A | Discharge status: Home / Self Care | Payer: BC Managed Care – PPO | Attending: Emergency Medicine | Admitting: Emergency Medicine

## 2021-07-26 ENCOUNTER — Other Ambulatory Visit (HOSPITAL_COMMUNITY): Payer: Self-pay

## 2021-07-26 ENCOUNTER — Encounter (HOSPITAL_COMMUNITY): Payer: Self-pay

## 2021-07-26 DIAGNOSIS — L0291 Cutaneous abscess, unspecified: Secondary | ICD-10-CM

## 2021-07-26 DIAGNOSIS — L0211 Cutaneous abscess of neck: Secondary | ICD-10-CM | POA: Insufficient documentation

## 2021-07-26 DIAGNOSIS — R221 Localized swelling, mass and lump, neck: Secondary | ICD-10-CM | POA: Diagnosis present

## 2021-07-26 DIAGNOSIS — Z8616 Personal history of COVID-19: Secondary | ICD-10-CM | POA: Insufficient documentation

## 2021-07-26 DIAGNOSIS — E119 Type 2 diabetes mellitus without complications: Secondary | ICD-10-CM | POA: Insufficient documentation

## 2021-07-26 DIAGNOSIS — I10 Essential (primary) hypertension: Secondary | ICD-10-CM | POA: Diagnosis not present

## 2021-07-26 MED ORDER — HYDROCODONE-ACETAMINOPHEN 5-325 MG PO TABS
1.0000 | ORAL_TABLET | Freq: Four times a day (QID) | ORAL | 0 refills | Status: DC | PRN
  Filled 2021-07-26: qty 10, 3d supply, fill #0

## 2021-07-26 MED ORDER — SULFAMETHOXAZOLE-TRIMETHOPRIM 800-160 MG PO TABS
1.0000 | ORAL_TABLET | Freq: Two times a day (BID) | ORAL | 0 refills | Status: DC
  Filled 2021-07-26: qty 10, 5d supply, fill #0

## 2021-07-26 MED ORDER — LIDOCAINE HCL (PF) 1 % IJ SOLN
5.0000 mL | Freq: Once | INTRAMUSCULAR | Status: AC
  Administered 2021-07-26: 5 mL
  Filled 2021-07-26: qty 30

## 2021-07-26 NOTE — Discharge Instructions (Signed)
You were seen today for an abscess which was drained.  You may keep the incision covered until drainage stops.  You may shower normally.  Please place warm compresses over the area over the next week throughout the day.  I have provided contact information for general surgery to follow-up with as needed.  I also recommend following up with your primary care provider.  I prescribed a 5-day course of antibiotics.  If you become pregnant, discontinue the use and contact primary care for further options. I also prescribed a short course of pain medication to be used as needed.

## 2021-07-26 NOTE — ED Triage Notes (Signed)
Pt arrived via POV, c/o cyst on back of neck. Denies any drainage. States she has had fevers, but recently had COVID so unsure of origin.

## 2021-07-26 NOTE — ED Provider Notes (Signed)
[facility]-EMERGENCY DEPT Provider Note   CSN: [id number] Arrival date & time: 07/26/21  1220     History  Chief Complaint  Patient presents with   Cyst    Laura Benson is a 31 y.o. female.  Patient presents to the hospital complaining of a cyst on the back of the right side of her neck.  Patient states that it started approximate 3 weeks ago.  Primary care saw it and thought it was too small to drain and did not prescribe antibiotics.  Patient states that since that time she has had COVID.  The area has swelled in size and is significantly tender.  Patient presents today hoping for drainage of the area.  Patient does endorse fevers but states that she had COVID over the 2 weeks and believes the fevers were due to COVID.  Other past medical history significant for DM, obesity, hypertension, anxiety  HPI     Home Medications Prior to Admission medications   Medication Sig Start Date End Date Taking? Authorizing Provider  HYDROcodone-acetaminophen (NORCO/VICODIN) 5-325 MG tablet Take 1 tablet by mouth every 6 (six) hours as needed. 07/26/21  Yes Darrick Grinder, PA-C  sulfamethoxazole-trimethoprim (BACTRIM DS) 800-160 MG tablet Take 1 tablet by mouth 2 (two) times daily for 5 days. 07/26/21 07/31/21 Yes Barrie Dunker B, PA-C  escitalopram (LEXAPRO) 10 MG tablet TAKE 1 TABLET BY MOUTH EVERY DAY 07/25/21   Wendling, [name], DO  Lancets [facility] DELICA PLUS [place]) MISC USE DAILY TO CHECK BLOOD SUGAR 01/30/20   Wendling, [name], DO  norethindrone-ethinyl estradiol-FE (LOESTRIN FE) 1-20 MG-MCG tablet Take 1 tablet by mouth daily. 07/03/21   Wendling, [name], DO  ONETOUCH VERIO test strip USE AS DAILY TO CHECK BLOOD SUGAR. 01/30/20   Sharlene Dory, DO      Allergies    Patient has no known allergies.    Review of Systems   Review of Systems  Skin:        Skin abscess on back of neck    Physical Exam Updated Vital Signs BP (!)  154/125 (BP Location: Left Wrist)   Pulse 80   Temp 99.3 F (37.4 C) (Oral)   Resp 20   Ht 5\' 6"  (1.676 m)   Wt (!) 174.2 kg   SpO2 100%   BMI 61.98 kg/m  Physical Exam Vitals and nursing note reviewed.  Constitutional:      General: She is not in acute distress.    Appearance: She is obese.  HENT:     Head: Normocephalic and atraumatic.  Eyes:     Conjunctiva/sclera: Conjunctivae normal.  Neck:     Comments: Apparent abscess noted on back of neck with fluid collection noted on ultrasound Cardiovascular:     Rate and Rhythm: Normal rate.  Pulmonary:     Effort: Pulmonary effort is normal.  Neurological:     Mental Status: She is alert.     ED Results / Procedures / Treatments   Labs (all labs ordered are listed, but only abnormal results are displayed) Labs Reviewed - No data to display  EKG None  Radiology No results found.  Procedures . Incision and Drainage  Date/Time: 07/26/2021 2:46 PM  Performed by: 07/28/2021, PA-C Authorized by: Darrick Grinder, PA-C   Consent:    Consent obtained:  Verbal   Consent given by:  Patient   Risks discussed:  Bleeding, incomplete drainage, pain and damage to other organs   Alternatives  discussed:  No treatment Universal protocol:    Procedure explained and questions answered to patient or proxy's satisfaction: yes     Relevant documents present and verified: yes     Imaging studies available: yes     Required blood products, implants, devices, and special equipment available: yes     Site/side marked: yes     Immediately prior to procedure, a time out was called: yes     Patient identity confirmed:  Verbally with patient Location:    Type:  Abscess   Size:  6cmx4cmx2cm   Location:  Neck   Neck location:  R posterior Pre-procedure details:    Skin preparation:  Betadine Sedation:    Sedation type:  None Anesthesia:    Anesthesia method:  Local infiltration   Local anesthetic:  Lidocaine 1% w/o  epi Procedure type:    Complexity:  Complex Procedure details:    Ultrasound guidance: yes     Needle aspiration: no     Incision types:  Cruciate   Incision depth:  Subcutaneous   Wound management:  Probed and deloculated, irrigated with saline and extensive cleaning   Drainage:  Purulent   Drainage amount:  Moderate   Packing materials:  None Post-procedure details:    Procedure completion:  Tolerated well, no immediate complications     Medications Ordered in ED Medications  lidocaine (PF) (XYLOCAINE) 1 % injection 5 mL (5 mLs Infiltration Given 07/26/21 1353)    ED Course/ Medical Decision Making/ A&P                           Medical Decision Making Risk Prescription drug management.   Patient presented with skin lesion suspicious for abscess.  Ultrasound showed fluid accumulation below the lesion.  Abscess was drained as noted above.  Moderate to copious amount of purulent drainage.  Wound was covered with a sterile dressing.  No systemic symptoms at this time.  Plan to discharge patient home with antibiotics, short course of pain medication, and information for general surgery for follow-up as needed.  Patient will use warm compresses at home alongside the antibiotics.        Final Clinical Impression(s) / ED Diagnoses Final diagnoses:  Abscess    Rx / DC Orders ED Discharge Orders          Ordered    sulfamethoxazole-trimethoprim (BACTRIM DS) 800-160 MG tablet  2 times daily        07/26/21 1452    HYDROcodone-acetaminophen (NORCO/VICODIN) 5-325 MG tablet  Every 6 hours PRN        07/26/21 1452              Pamala Duffel 07/26/21 1454    Pricilla Loveless, MD 07/27/21 [phone]

## 2021-07-26 NOTE — ED Provider Triage Note (Signed)
Emergency Medicine Provider Triage Evaluation Note  Laura Benson , a 31 y.o. female  was evaluated in triage.  Pt complains of skin lesion to the neck.  Patient has lesion on the right side posterior neck near the base of the neck.  Patient states that approximately 3 weeks ago she noticed it and was seen by primary care.  No treatment was administered at that time.  Since then the patient has had COVID and noticed that the area grew.  Now has mass at that area approximately 3 and half centimeters in diameter.  Patient tested for COVID this morning and tested negative.  Denies COVID symptoms at this time  Review of Systems  Positive: Skin lesion Negative: Fever (outside of Covid)  Physical Exam  BP (!) 154/125 (BP Location: Left Wrist)   Pulse 80   Temp 99.3 F (37.4 C) (Oral)   Resp 20   Ht 5\' 6"  (1.676 m)   Wt (!) 174.2 kg   SpO2 100%   BMI 61.98 kg/m  Gen:   Awake, no distress   Resp:  Normal effort  MSK:   Moves extremities without difficulty  Other:    Medical Decision Making  Medically screening exam initiated at 12:40 PM.  Appropriate orders placed.  Harlo Jaso was informed that the remainder of the evaluation will be completed by another provider, this initial triage assessment does not replace that evaluation, and the importance of remaining in the ED until their evaluation is complete.     Vivien Rossetti, PA-C 07/26/21 1242

## 2021-07-29 ENCOUNTER — Emergency Department (HOSPITAL_COMMUNITY)
Admission: EM | Admit: 2021-07-29 | Discharge: 2021-07-29 | Disposition: A | Discharge status: Home / Self Care | Payer: BC Managed Care – PPO | Attending: Emergency Medicine | Admitting: Emergency Medicine

## 2021-07-29 ENCOUNTER — Encounter (HOSPITAL_COMMUNITY): Payer: Self-pay

## 2021-07-29 ENCOUNTER — Other Ambulatory Visit: Payer: Self-pay

## 2021-07-29 DIAGNOSIS — L509 Urticaria, unspecified: Secondary | ICD-10-CM | POA: Insufficient documentation

## 2021-07-29 DIAGNOSIS — L299 Pruritus, unspecified: Secondary | ICD-10-CM | POA: Diagnosis not present

## 2021-07-29 DIAGNOSIS — R21 Rash and other nonspecific skin eruption: Secondary | ICD-10-CM | POA: Diagnosis present

## 2021-07-29 DIAGNOSIS — T7840XA Allergy, unspecified, initial encounter: Secondary | ICD-10-CM

## 2021-07-29 LAB — BASIC METABOLIC PANEL
Anion gap: 5 (ref 5–15)
BUN: 11 mg/dL (ref 6–20)
CO2: 23 mmol/L (ref 22–32)
Calcium: 8.7 mg/dL — ABNORMAL LOW (ref 8.9–10.3)
Chloride: 109 mmol/L (ref 98–111)
Creatinine, Ser: 0.94 mg/dL (ref 0.44–1.00)
GFR, Estimated: >60 mL/min (ref >60)
Glucose, Bld: 171 mg/dL — ABNORMAL HIGH (ref 70–99)
Potassium: 3.8 mmol/L (ref 3.5–5.1)
Sodium: 137 mmol/L (ref 135–145)

## 2021-07-29 LAB — CBC
HCT: 37.5 % (ref 36.0–46.0)
Hemoglobin: 11.6 g/dL — ABNORMAL LOW (ref 12.0–15.0)
MCH: 23.4 pg — ABNORMAL LOW (ref 26.0–34.0)
MCHC: 30.9 g/dL (ref 30.0–36.0)
MCV: 75.8 fL — ABNORMAL LOW (ref 80.0–100.0)
Platelets: 352 10*3/uL (ref 150–400)
RBC: 4.95 MIL/uL (ref 3.87–5.11)
RDW: 14 % (ref 11.5–15.5)
WBC: 4.5 10*3/uL (ref 4.0–10.5)
nRBC: 0 % (ref 0.0–0.2)

## 2021-07-29 NOTE — ED Provider Triage Note (Signed)
Emergency Medicine Provider Triage Evaluation Note  Laura Benson , a 31 y.o. female  was evaluated in triage.  Pt complains of itching, burning, stinging and lightheadedness.  Patient reports that she recently had abscess on posterior neck drained on Friday, was placed on Bactrim at this time.  Patient reports she has taken for the last 2 days and then this morning had sudden onset pruritus, itching and swelling to the back of her neck.  Patient believes that she might be allergic to the medication.  Patient denies any trouble swallowing, shortness of breath, nausea vomiting with diarrhea, hives.  Patient posterior neck abscess appears clean dry and intact with bandage in place.  Patient denies any fevers at home.  Review of Systems  Positive:  Negative:   Physical Exam  BP (!) 156/98 (BP Location: Right Arm)   Pulse 99   Temp 98.1 F (36.7 C) (Oral)   Resp 18   Ht 5\' 6"  (1.676 m)   Wt (!) 172.4 kg   LMP 07/24/2021   SpO2 96%   BMI 61.33 kg/m  Gen:   Awake, no distress   Resp:  Normal effort  MSK:   Moves extremities without difficulty  Other:  Fluctuant mass posterior neck, unsure if this patient body habitus or underlying abscess.  Medical Decision Making  Medically screening exam initiated at 4:01 PM.  Appropriate orders placed.  Briannah Lona was informed that the remainder of the evaluation will be completed by another provider, this initial triage assessment does not replace that evaluation, and the importance of remaining in the ED until their evaluation is complete.     Vivien Rossetti, PA-C 07/29/21 1603

## 2021-07-29 NOTE — ED Provider Notes (Signed)
[facility]-EMERGENCY DEPT Provider Note   CSN: [id number] Arrival date & time: 07/29/21  1516     History  Chief Complaint  Patient presents with   Pruritis    Laura Benson is a 31 y.o. female here presenting with possible allergic reaction.  Patient was seen here 2 days ago for right neck abscess and had an I&D performed.  Patient also put on Bactrim.  Patient states that she had 2 doses so far and started having itchiness and noticed a rash in her torso and also her neck area.  Denies any trouble swallowing.  Patient states that she has no new medicines other than Bactrim.  The history is provided by the patient.       Home Medications Prior to Admission medications   Medication Sig Start Date End Date Taking? Authorizing Provider  escitalopram (LEXAPRO) 10 MG tablet TAKE 1 TABLET BY MOUTH EVERY DAY 07/25/21   Sharlene Dory, DO  HYDROcodone-acetaminophen (NORCO/VICODIN) 5-325 MG tablet Take 1 tablet by mouth every 6 hours as needed. 07/26/21   Barrie Dunker B, PA-C  Lancets (ONETOUCH DELICA PLUS LANCET33G) MISC USE DAILY TO CHECK BLOOD SUGAR 01/30/20   Wendling, [name], DO  norethindrone-ethinyl estradiol-FE (LOESTRIN FE) 1-20 MG-MCG tablet Take 1 tablet by mouth daily. 07/03/21   Wendling, [name], DO  ONETOUCH VERIO test strip USE AS DAILY TO CHECK BLOOD SUGAR. 01/30/20   Sharlene Dory, DO  sulfamethoxazole-trimethoprim (BACTRIM DS) 800-160 MG tablet Take 1 tablet by mouth 2 times daily for 5 days. 07/26/21 07/31/21  Darrick Grinder, PA-C      Allergies    Patient has no known allergies.    Review of Systems   Review of Systems  Skin:  Positive for rash.  All other systems reviewed and are negative.   Physical Exam Updated Vital Signs BP (!) 156/98 (BP Location: Right Arm)   Pulse 99   Temp 98.1 F (36.7 C) (Oral)   Resp 18   Ht 5\' 6"  (1.676 m)   Wt (!) 172.4 kg   LMP 07/24/2021   SpO2 96%   BMI 61.33 kg/m   Physical Exam Vitals and nursing note reviewed.  HENT:     Head: Normocephalic.     Mouth/Throat:     Mouth: Mucous membranes are moist.  Eyes:     Extraocular Movements: Extraocular movements intact.     Pupils: Pupils are equal, round, and reactive to light.  Neck:     Comments: Right side of the neck with incision mark and there is no purulent drainage.  There is minimal redness around that area. Cardiovascular:     Rate and Rhythm: Normal rate and regular rhythm.     Pulses: Normal pulses.  Pulmonary:     Effort: Pulmonary effort is normal.     Breath sounds: Normal breath sounds.  Abdominal:     General: Abdomen is flat.     Palpations: Abdomen is soft.  Musculoskeletal:        General: Normal range of motion.  Skin:    Capillary Refill: Capillary refill takes less than 2 seconds.     Comments: Patient has some urticaria on her torso and back  Neurological:     General: No focal deficit present.     Mental Status: She is alert and oriented to person, place, and time.  Psychiatric:        Mood and Affect: Mood normal.  Behavior: Behavior normal.     ED Results / Procedures / Treatments   Labs (all labs ordered are listed, but only abnormal results are displayed) Labs Reviewed  CBC - Abnormal; Notable for the following components:      Result Value   Hemoglobin 11.6 (*)    MCV 75.8 (*)    MCH 23.4 (*)    All other components within normal limits  BASIC METABOLIC PANEL - Abnormal; Notable for the following components:   Glucose, Bld 171 (*)    Calcium 8.7 (*)    All other components within normal limits    EKG None  Radiology No results found.  Procedures Procedures    Medications Ordered in ED Medications - No data to display  ED Course/ Medical Decision Making/ A&P                           Medical Decision Making Laura Benson is a 31 y.o. female here presenting with rash.  Patient likely has allergic reaction to Bactrim.  Patient has  no signs of anaphylaxis.  I looked at her wound and it is healing well and no evidence of cellulitis.  I think at this point I would just discontinue antibiotics and start Benadryl as needed.  We will hold off on steroids for now and told her to use warm compresses to the wound.  Stable for discharge   Amount and/or Complexity of Data Reviewed Labs: ordered. Decision-making details documented in ED Course.    Final Clinical Impression(s) / ED Diagnoses Final diagnoses:  None    Rx / DC Orders ED Discharge Orders     None         Charlynne Pander, MD 07/29/21 2029

## 2021-07-29 NOTE — ED Triage Notes (Signed)
Patient has a posterior neck abscess drained. Patient was prescribed sulfamethoxazole- Trimethoprim. Patient states she started having itching and redness/swelling below the abscess area the next day.

## 2021-07-29 NOTE — Discharge Instructions (Signed)
You have an allergic reaction to Bactrim.  Please discontinue taking Bactrim.  You may take Benadryl 25 mg every 6 hours as needed for rash  Please use warm compresses to your wound  Return to ER if you have fever, purulent drainage, trouble breathing

## 2021-07-31 ENCOUNTER — Encounter: Payer: Self-pay | Admitting: Family Medicine

## 2021-07-31 ENCOUNTER — Ambulatory Visit: Payer: BC Managed Care – PPO | Admitting: Family Medicine

## 2021-07-31 VITALS — BP 132/86 | HR 89 | Temp 97.9°F | Ht 66.0 in | Wt 386.5 lb

## 2021-07-31 DIAGNOSIS — T7840XA Allergy, unspecified, initial encounter: Secondary | ICD-10-CM | POA: Diagnosis not present

## 2021-07-31 DIAGNOSIS — F339 Major depressive disorder, recurrent, unspecified: Secondary | ICD-10-CM | POA: Diagnosis not present

## 2021-07-31 DIAGNOSIS — F411 Generalized anxiety disorder: Secondary | ICD-10-CM

## 2021-07-31 MED ORDER — ESCITALOPRAM OXALATE 20 MG PO TABS: 20.0000 mg | ORAL_TABLET | Freq: Every day | ORAL | 2 refills | Status: DC

## 2021-07-31 MED ORDER — PREDNISONE 20 MG PO TABS: 40.0000 mg | ORAL_TABLET | Freq: Every day | ORAL | 0 refills | Status: AC

## 2021-07-31 NOTE — Progress Notes (Signed)
Chief Complaint  Patient presents with   Follow-up    Light headed     Subjective Laura Benson presents for f/u anxiety/depression.  Pt is currently being treated with Lexapro 10 mg/d.  Reports compliance, no AE's.  Reports doing poorly since treatment. Got covid which made things worse w anxiety.  No thoughts of harming self or others. No self-medication with alcohol, prescription drugs or illicit drugs. Pt is not following with a counselor/psychologist.  Had allergic rxn to Bactrim and has been itching. Went to ED, was told to take Benadryl routinely and it makes her drowsy. Still itching slightly. No swelling, breathing issues, hives.   Past Medical History:  Diagnosis Date   Anxiety    Asthma    Complication of anesthesia    Take for a while to wake up   Depression    Diabetes mellitus without complication (HCC)    Hypertension    Morbid obesity (HCC)    Sleep apnea    CPAP   Tachycardia    Allergies as of 07/31/2021   No Known Allergies      Medication List        Accurate as of July 31, 2021 12:17 PM. If you have any questions, ask your nurse or doctor.          STOP taking these medications    HYDROcodone-acetaminophen 5-325 MG tablet Commonly known as: NORCO/VICODIN Stopped by: Sharlene Dory, DO   sulfamethoxazole-trimethoprim 800-160 MG tablet Commonly known as: BACTRIM DS Stopped by: Sharlene Dory, DO       TAKE these medications    escitalopram 20 MG tablet Commonly known as: Lexapro Take 1 tablet (20 mg total) by mouth daily. What changed:  medication strength how much to take Changed by: Sharlene Dory, DO   norethindrone-ethinyl estradiol-FE 1-20 MG-MCG tablet Commonly known as: LOESTRIN FE Take 1 tablet by mouth daily.   OneTouch Delica Plus Lancet33G Misc USE DAILY TO CHECK BLOOD SUGAR   OneTouch Verio test strip Generic drug: glucose blood USE AS DAILY TO CHECK BLOOD SUGAR.   predniSONE 20 MG  tablet Commonly known as: DELTASONE Take 2 tablets (40 mg total) by mouth daily with breakfast for 5 days. Started by: Sharlene Dory, DO        Exam BP 132/86   Pulse 89   Temp 97.9 F (36.6 C) (Oral)   Ht 5\' 6"  (1.676 m)   Wt (!) 386 lb 8 oz (175.3 kg)   LMP 07/24/2021   SpO2 97%   BMI 62.38 kg/m  General:  well developed, well nourished, in no apparent distress Lungs:  No respiratory distress Psych: well oriented with normal range of affect and age-appropriate judgement/insight, alert and oriented x4.  Assessment and Plan  GAD (generalized anxiety disorder) - Plan: escitalopram (LEXAPRO) 20 MG tablet  Depression, recurrent (HCC) - Plan: escitalopram (LEXAPRO) 20 MG tablet  Allergic reaction, initial encounter - Plan: predniSONE (DELTASONE) 20 MG tablet  1/2. Chronic, controlled. Increase Lexapro 10 mg/d to 20 mg/d. Counseling info suggested. Stay active.  3. Stop Benadryl scheduled, only use PRN. Use Zyrtec, add 5 d pred burst.  F/u in 1 mo. The patient voiced understanding and agreement to the plan.  07/26/2021 [employer], DO 07/31/21 12:17 PM

## 2021-07-31 NOTE — Patient Instructions (Addendum)
Start taking Zyrtec daily in addition to the prednisone. OK to stop the Benadryl for now, just use it as needed.  Aim to do some physical exertion for 150 minutes per week. This is typically divided into 5 days per week, 30 minutes per day. The activity should be enough to get your heart rate up. Anything is better than nothing if you have time constraints.  Try not to scratch as this can make things worse. Avoid scented products while dealing with this. You may resume when the itchiness resolves. Cold/cool compresses can help.   Let Laura Benson know if you need anything.

## 2021-08-22 ENCOUNTER — Other Ambulatory Visit: Payer: Self-pay | Admitting: Family Medicine

## 2021-08-22 DIAGNOSIS — F411 Generalized anxiety disorder: Secondary | ICD-10-CM

## 2021-08-22 DIAGNOSIS — F339 Major depressive disorder, recurrent, unspecified: Secondary | ICD-10-CM

## 2021-08-30 ENCOUNTER — Ambulatory Visit (HOSPITAL_COMMUNITY)
Admission: EM | Admit: 2021-08-30 | Discharge: 2021-08-31 | Disposition: A | Discharge status: Other Acute Inpatient Hospital | Payer: No Payment, Other | Attending: Urology | Admitting: Urology

## 2021-08-30 ENCOUNTER — Other Ambulatory Visit: Payer: Self-pay

## 2021-08-30 DIAGNOSIS — Z20822 Contact with and (suspected) exposure to covid-19: Secondary | ICD-10-CM | POA: Insufficient documentation

## 2021-08-30 DIAGNOSIS — F322 Major depressive disorder, single episode, severe without psychotic features: Secondary | ICD-10-CM | POA: Diagnosis not present

## 2021-08-30 DIAGNOSIS — R45851 Suicidal ideations: Secondary | ICD-10-CM | POA: Diagnosis not present

## 2021-08-30 DIAGNOSIS — F411 Generalized anxiety disorder: Secondary | ICD-10-CM | POA: Diagnosis not present

## 2021-08-30 LAB — COMPREHENSIVE METABOLIC PANEL
ALT: 18 U/L (ref 0–44)
AST: 18 U/L (ref 15–41)
Albumin: 3.9 g/dL (ref 3.5–5.0)
Alkaline Phosphatase: 54 U/L (ref 38–126)
Anion gap: 8 (ref 5–15)
BUN: 5 mg/dL — ABNORMAL LOW (ref 6–20)
CO2: 25 mmol/L (ref 22–32)
Calcium: 9.2 mg/dL (ref 8.9–10.3)
Chloride: 107 mmol/L (ref 98–111)
Creatinine, Ser: 0.81 mg/dL (ref 0.44–1.00)
GFR, Estimated: >60 mL/min (ref >60)
Glucose, Bld: 111 mg/dL — ABNORMAL HIGH (ref 70–99)
Potassium: 4.1 mmol/L (ref 3.5–5.1)
Sodium: 140 mmol/L (ref 135–145)
Total Bilirubin: 1 mg/dL (ref 0.3–1.2)
Total Protein: 6.8 g/dL (ref 6.5–8.1)

## 2021-08-30 LAB — TSH: TSH: 1.211 u[IU]/mL (ref 0.350–4.500)

## 2021-08-30 LAB — CBC WITH DIFFERENTIAL/PLATELET
Abs Immature Granulocytes: 0.01 10*3/uL (ref 0.00–0.07)
Basophils Absolute: 0 10*3/uL (ref 0.0–0.1)
Basophils Relative: 1 %
Eosinophils Absolute: 0.1 10*3/uL (ref 0.0–0.5)
Eosinophils Relative: 2 %
HCT: 37.6 % (ref 36.0–46.0)
Hemoglobin: 11.9 g/dL — ABNORMAL LOW (ref 12.0–15.0)
Immature Granulocytes: 0 %
Lymphocytes Relative: 52 %
Lymphs Abs: 2.6 10*3/uL (ref 0.7–4.0)
MCH: 23.7 pg — ABNORMAL LOW (ref 26.0–34.0)
MCHC: 31.6 g/dL (ref 30.0–36.0)
MCV: 74.9 fL — ABNORMAL LOW (ref 80.0–100.0)
Monocytes Absolute: 0.6 10*3/uL (ref 0.1–1.0)
Monocytes Relative: 11 %
Neutro Abs: 1.7 10*3/uL (ref 1.7–7.7)
Neutrophils Relative %: 34 %
Platelets: 332 10*3/uL (ref 150–400)
RBC: 5.02 MIL/uL (ref 3.87–5.11)
RDW: 16.5 % — ABNORMAL HIGH (ref 11.5–15.5)
WBC: 5 10*3/uL (ref 4.0–10.5)
nRBC: 0 % (ref 0.0–0.2)

## 2021-08-30 LAB — LIPID PANEL
Cholesterol: 178 mg/dL (ref 0–200)
HDL: 48 mg/dL (ref >40)
LDL Cholesterol: 119 mg/dL — ABNORMAL HIGH (ref 0–99)
Total CHOL/HDL Ratio: 3.7 RATIO
Triglycerides: 57 mg/dL (ref <150)
VLDL: 11 mg/dL (ref 0–40)

## 2021-08-30 LAB — ETHANOL: Alcohol, Ethyl (B): <10 mg/dL (ref <10)

## 2021-08-30 LAB — POCT URINE DRUG SCREEN - MANUAL ENTRY (I-SCREEN)
POC Amphetamine UR: NOT DETECTED
POC Buprenorphine (BUP): NOT DETECTED
POC Cocaine UR: NOT DETECTED
POC Marijuana UR: POSITIVE — AB
POC Methadone UR: NOT DETECTED
POC Methamphetamine UR: NOT DETECTED
POC Morphine: NOT DETECTED
POC Oxazepam (BZO): NOT DETECTED
POC Oxycodone UR: NOT DETECTED
POC Secobarbital (BAR): NOT DETECTED

## 2021-08-30 MED ORDER — TRAZODONE HCL 50 MG PO TABS
50.0000 mg | ORAL_TABLET | Freq: Every evening | ORAL | Status: DC | PRN
  Administered 2021-08-30: 50 mg via ORAL
  Filled 2021-08-30: qty 1

## 2021-08-30 MED ORDER — ACETAMINOPHEN 325 MG PO TABS
650.0000 mg | ORAL_TABLET | Freq: Four times a day (QID) | ORAL | Status: DC | PRN
  Administered 2021-08-31: 650 mg via ORAL
  Filled 2021-08-30: qty 2

## 2021-08-30 MED ORDER — HYDROXYZINE HCL 25 MG PO TABS
25.0000 mg | ORAL_TABLET | Freq: Three times a day (TID) | ORAL | Status: DC | PRN
  Administered 2021-08-30 – 2021-08-31 (×2): 25 mg via ORAL
  Filled 2021-08-30 (×2): qty 1

## 2021-08-30 MED ORDER — MAGNESIUM HYDROXIDE 400 MG/5ML PO SUSP: 30.0000 mL | Freq: Every day | ORAL | Status: DC | PRN

## 2021-08-30 MED ORDER — ALUM & MAG HYDROXIDE-SIMETH 200-200-20 MG/5ML PO SUSP: 30.0000 mL | ORAL | Status: DC | PRN

## 2021-08-30 NOTE — ED Triage Notes (Signed)
Pt presents to [facility], voluntarily accompanied by [employer] with complaint of suicidal ideation with a plan to overdose on medication, anxiety, chronic pain and depression. Pt reports that she sent a message through messenger to family members wanting to end her life and her sister called [employer] for a welfare check. Pt reports struggling with various issues from bills, chronic pain and taking care of her fiance that currently has medical issues. Pt is currently employed, but finds it difficult to get through work some days. Pt is tearful during triage process. Pt reports that her father is a major support system for her, but she feels guilty having to always bother him about her problems. Pt is also still grieving from the loss of her grandmother last year. Pt also admits to experiencing auditory hallucinations that are commanding in nature. Pt reports the voices telling her to "just do it" to end her life and that she "has no one".  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 Pt has hx of SI and was last hospitalized in 2019. Pt is also prescribed escitalopram (20mg ) through her primary care physician. Pt denies HI.
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
## 2021-08-30 NOTE — ED Provider Notes (Signed)
[facility] Continuous Assessment Admission H&P  Date: 08/30/21 Patient Name: Laura Benson MRN: [id number] Chief Complaint:  Chief Complaint  Patient presents with   suicidal ideation   Depression   Stress      Diagnoses:  Final diagnoses:  None    HPI: Laura Benson is a 31 year old female with psychiatric history significant for depression, anxiety, and suicidal ideation. Patient was brought to [facility] voluntarily via Event organiser.  This nurse petitioner reviewed patient's chart and met with her face-to-face.  On evaluation, patient is alert and oriented to x 4.  She is calm and cooperative. Patient's speech is clear and coherent. She has normal behavior and eye contact. Patient's mood is depressed with congruent affect.  No signs of mania, distractibility, preoccupation, or delusional thought content noted during this assessment.  Patient reports that she sent a message to her sister and her sister was concerned about her and contacted law enforcement. Patient is unwilling to share the context of the message that she sent to her sister. Patient acknowledged that she has been experiencing worsening depressive symptoms over the past several weeks. Patient states "I am on the verge of losing everything; I cannot handle it."  She identifies several life stressors: death of grandmother, financial difficulties, fianc's health, chronic back pain, abdominal hernia" as triggers for worsening depressive symptoms.  She is endorsing depressive symptoms of crying spells, irritability, poor focus, poor sleep, worthlessness, poor hygiene, poor appetite, worthlessness, and hopelessness.  Patient reports that she is complaint with escitalopram $RemoveBeforeDEI'20mg'edeSDCGAiQIYVSwc$ /day prescribed by her PCP. She says she does not have a therapist because "I don't trust people." She says she's currently not interested in therapy.   She is endorsing suicidal ideation with plan to overdose on "a lot of pills." She admits to  prior suicidal ideation in 2019 and was admitted to inpatient psych at [facility]. She denies history of self-harming. She denies homicidal ideation, paranoia, and visual hallucination. She endorses auditory hallucination of voices saying "just do it!, quit your job, you don't need it." She admits to smoking marijuana 3-4 days per week. She says she smoked 2 blunts of marijuana today. She denies all other substance use.    PHQ 2-9:  [employer] Visit from 07/01/2021 in [facility] Visit from 06/27/2020 in [facility] Visit from 10/01/2016 in [facility] at [facility] that you would be better off dead, or of hurting yourself in some way Not at all Not at all Not at all  PHQ-9 Total Score $RemoveBef'14 20 12       'WBGSWRvyCq$ Flowsheet Row ED from 07/29/2021 in [place] DEPT ED from 07/26/2021 in [place] [facility]PT ED from 07/08/2021 in [place] DEPT  C-SSRS RISK CATEGORY No Risk No Risk No Risk        Total Time spent with patient: 15 minutes  Musculoskeletal  Strength & Muscle Tone: within normal limits Gait & Station: normal Patient leans: Right  Psychiatric Specialty Exam  Presentation General Appearance: Appropriate for Environment  Eye Contact:Good  Speech:Clear and Coherent  Speech Volume:Normal  Handedness:Right   Mood and Affect  Mood:Depressed  Affect:Congruent   Thought Process  Thought Processes:Coherent  Descriptions of Associations:Intact  Orientation:Full (Time, Place and Person)  Thought Content:WDL    Hallucinations:Hallucinations: Auditory Description of Auditory Hallucinations: "just do it, quit your job, you don't need it."  Ideas of Reference:None  Suicidal Thoughts:Suicidal Thoughts: Yes, Active SI Active Intent and/or Plan: With Plan  Homicidal Thoughts:Homicidal  Thoughts: No   Sensorium  Memory:Immediate Good; Remote Good; Recent Good  Judgment:Fair  Insight:Good   Executive Functions  Concentration:Fair  Attention Span:Fair  Recall:Good  Fund of Knowledge:Good  Language:Good   Psychomotor Activity  Psychomotor Activity:Psychomotor Activity: Normal   Assets  Assets:Communication Skills; Desire for Improvement; Housing; Physical Health; Social Support; Vocational/Educational   Sleep  Sleep:Sleep: Poor Number of Hours of Sleep: 4   Nutritional Assessment (For OBS and FBC admissions only) Has the patient had a weight loss or gain of 10 pounds or more in the last 3 months?: No Has the patient had a decrease in food intake/or appetite?: Yes Does the patient have dental problems?: No Does the patient have eating habits or behaviors that may be indicators of an eating disorder including binging or inducing vomiting?: No Has the patient recently lost weight without trying?: 0 Has the patient been eating poorly because of a decreased appetite?: 0 Malnutrition Screening Tool Score: 0    Physical Exam Vitals and nursing note reviewed.  Constitutional:      General: She is not in acute distress.    Appearance: She is well-developed. She is obese. She is not ill-appearing.  HENT:     Head: Normocephalic and atraumatic.  Eyes:     Conjunctiva/sclera: Conjunctivae normal.  Cardiovascular:     Rate and Rhythm: Normal rate.     Heart sounds: No murmur heard. Pulmonary:     Effort: Pulmonary effort is normal. No respiratory distress.  Abdominal:     Palpations: Abdomen is soft.     Tenderness: There is no abdominal tenderness.  Musculoskeletal:        General: No swelling. Normal range of motion.     Cervical back: Normal range of motion.  Skin:    General: Skin is warm.     Capillary Refill: Capillary refill takes less than 2 seconds.  Neurological:     Mental Status: She is alert and oriented to person, place, and time.   Psychiatric:        Attention and Perception: She is attentive. She perceives auditory hallucinations. She does not perceive visual hallucinations.        Mood and Affect: Mood is depressed.        Behavior: Behavior normal. Behavior is cooperative.        Thought Content: Thought content includes suicidal ideation. Thought content includes suicidal plan.        Cognition and Memory: Cognition normal.    Review of Systems  Constitutional: Negative.   HENT: Negative.    Eyes: Negative.   Respiratory: Negative.    Cardiovascular: Negative.   Gastrointestinal: Negative.   Genitourinary: Negative.   Musculoskeletal: Negative.   Skin: Negative.   Neurological: Negative.   Endo/Heme/Allergies: Negative.   Psychiatric/Behavioral:  Positive for depression, hallucinations, substance abuse and suicidal ideas. The patient is nervous/anxious.     Blood pressure (!) 160/101, pulse 65, temperature 98.3 F (36.8 C), temperature source Oral, resp. rate 16, SpO2 100 %. There is no height or weight on file to calculate BMI.  Past Psychiatric History: anxiety, depression, SI   Is the patient at risk to self? Yes  Has the patient been a risk to self in the past 6 months? No .    Has the patient been a risk to self within the distant past? Yes   Is the patient a risk to  others? No   Has the patient been a risk to others in the past 6 months? No   Has the patient been a risk to others within the distant past? No   Past Medical History:  Past Medical History:  Diagnosis Date   Anxiety    Asthma    Complication of anesthesia    Take for a while to wake up   Depression    Diabetes mellitus without complication ([place])    Hypertension    Morbid obesity ([place])    Sleep apnea    CPAP   Tachycardia     Past Surgical History:  Procedure Laterality Date   ADENOIDECTOMY     LAPAROSCOPIC GASTRIC SLEEVE RESECTION N/A 04/16/2020   Procedure: LAPAROSCOPIC GASTRIC SLEEVE RESECTION;  Surgeon: Greer Pickerel, MD;  Location: [facility];  Service: General;  Laterality: N/A;   TONSILLECTOMY     UPPER GI ENDOSCOPY N/A 04/16/2020   Procedure: UPPER GI ENDOSCOPY;  Surgeon: Greer Pickerel, MD;  Location: [facility];  Service: General;  Laterality: N/A;    Family History:  Family History  Problem Relation Age of Onset   Hypertension Mother    Diabetes Mother    Colon cancer Father    Hypertension Maternal Grandmother    Hypertension Paternal Grandmother    Diabetes Paternal Grandmother    Breast cancer Cousin     Social History:  Social History   Socioeconomic History   Marital status: Single    Spouse name: Not on file   Number of children: 0   Years of education: Not on file   Highest education level: Not on file  Occupational History    Employer: [employer]  Tobacco Use   Smoking status: Former    Years: 2.00    Types: Cigarettes   Smokeless tobacco: Never  Vaping Use   Vaping Use: Never used  Substance and Sexual Activity   Alcohol use: No   Drug use: No   Sexual activity: Yes    Birth control/protection: None  Other Topics Concern   Not on file  Social History Narrative   Not on file   Social Determinants of Health   Financial Resource Strain: Not on file  Food Insecurity: Not on file  Transportation Needs: Not on file  Physical Activity: Not on file  Stress: Not on file  Social Connections: Not on file  Intimate Partner Violence: Not on file    SDOH:  SDOH Screenings   Alcohol Screen: Medium Risk (05/05/2017)   Alcohol Screen    Last Alcohol Screening Score (AUDIT): 11  Depression (PHQ2-9): Medium Risk (07/01/2021)   Depression (PHQ2-9)    PHQ-2 Score: 14  Financial Resource Strain: Not on file  Food Insecurity: Not on file  Housing: Not on file  Physical Activity: Not on file  Social Connections: Not on file  Stress: Not on file  Tobacco Use: Medium Risk (07/31/2021)   Patient History    Smoking Tobacco Use: Former    Smokeless Tobacco Use: Never    Passive  Exposure: Not on file  Transportation Needs: Not on file    Last Labs:  Admission on 07/29/2021, Discharged on 07/29/2021  Component Date Value Ref Range Status   WBC 07/29/2021 4.5  4.0 - 10.5 K/uL Final   RBC 07/29/2021 4.95  3.87 - 5.11 MIL/uL Final   Hemoglobin 07/29/2021 11.6 (L)  12.0 - 15.0 g/dL Final   HCT 07/29/2021 37.5  36.0 - 46.0 % Final   MCV  07/29/2021 75.8 (L)  80.0 - 100.0 fL Final   MCH 07/29/2021 23.4 (L)  26.0 - 34.0 pg Final   MCHC 07/29/2021 30.9  30.0 - 36.0 g/dL Final   RDW 75/88/9857 14.0  11.5 - 15.5 % Final   Platelets 07/29/2021 352  150 - 400 K/uL Final   nRBC 07/29/2021 0.0  0.0 - 0.2 % Final   Performed at [facility], [address] [place], [place] [address]   Sodium 07/29/2021 137  135 - 145 mmol/L Final   Potassium 07/29/2021 3.8  3.5 - 5.1 mmol/L Final   Chloride 07/29/2021 109  98 - 111 mmol/L Final   CO2 07/29/2021 23  22 - 32 mmol/L Final   Glucose, Bld 07/29/2021 171 (H)  70 - 99 mg/dL Final   Glucose reference range applies only to samples taken after fasting for at least 8 hours.   BUN 07/29/2021 11  6 - 20 mg/dL Final   Creatinine, Ser 07/29/2021 0.94  0.44 - 1.00 mg/dL Final   Calcium 71/69/6806 8.7 (L)  8.9 - 10.3 mg/dL Final   GFR, Estimated 07/29/2021 >60  >60 mL/min Final   Comment: (NOTE) Calculated using the CKD-EPI Creatinine Equation (2021)    Anion gap 07/29/2021 5  5 - 15 Final   Performed at [facility], [address], [place], [place] [address]  Admission on 07/08/2021, Discharged on 07/08/2021  Component Date Value Ref Range Status   hCG, Beta Chain, Quant, S 07/08/2021 <1  <5 mIU/mL Final   Comment:          GEST. AGE      CONC.  (mIU/mL)   <=1 WEEK        5 - 50     2 WEEKS       50 - 500     3 WEEKS       100 - 10,000     4 WEEKS     1,000 - 30,000     5 WEEKS     3,500 - 115,000   6-8 WEEKS     12,000 - 270,000    12 WEEKS     15,000 - 220,000        FEMALE AND  NON-PREGNANT FEMALE:     LESS THAN 5 mIU/mL Performed at [facility], [address] [place], [place] [address]   Office Visit on 07/01/2021  Component Date Value Ref Range Status   Hgb A1c MFr Bld 07/01/2021 6.5  4.6 - 6.5 % Final   Glycemic Control Guidelines for People with Diabetes:Non Diabetic:  <6%Goal of Therapy: <7%Additional Action Suggested:  >8%    Microalb, Ur 07/01/2021 1.9  0.0 - 1.9 mg/dL Final   Creatinine,U 32/00/1612 239.7  mg/dL Final   Microalb Creat Ratio 07/01/2021 0.8  0.0 - 30.0 mg/g Final   Cholesterol 07/01/2021 168  0 - 200 mg/dL Final   ATP III Classification       Desirable:  < 200 mg/dL               Borderline High:  200 - 239 mg/dL          High:  > = 244 mg/dL   Triglycerides 26/13/0830 90.0  0.0 - 149.0 mg/dL Final   Normal:  <045 mg/dLBorderline High:  150 - 199 mg/dL   HDL 06/36/7561 79.73  >39.00 mg/dL Final   VLDL 35/51/9084 18.0  0.0 - 40.0 mg/dL Final  LDL Cholesterol 07/01/2021 107 (H)  0 - 99 mg/dL Final   Total CHOL/HDL Ratio 07/01/2021 4   Final                  Men          Women1/2 Average Risk     3.4          3.3Average Risk          5.0          4.42X Average Risk          9.6          7.13X Average Risk          15.0          11.0                       NonHDL 07/01/2021 125.06   Final   NOTE:  Non-HDL goal should be 30 mg/dL higher than patient's LDL goal (i.e. LDL goal of < 70 mg/dL, would have non-HDL goal of < 100 mg/dL)   Sodium 07/01/2021 139  135 - 145 mEq/L Final   Potassium 07/01/2021 4.1  3.5 - 5.1 mEq/L Final   Chloride 07/01/2021 105  96 - 112 mEq/L Final   CO2 07/01/2021 27  19 - 32 mEq/L Final   Glucose, Bld 07/01/2021 99  70 - 99 mg/dL Final   BUN 07/01/2021 9  6 - 23 mg/dL Final   Creatinine, Ser 07/01/2021 0.78  0.40 - 1.20 mg/dL Final   Total Bilirubin 07/01/2021 1.0  0.2 - 1.2 mg/dL Final   Alkaline Phosphatase 07/01/2021 52  39 - 117 U/L Final   AST 07/01/2021 12  0 - 37 U/L Final   ALT  07/01/2021 9  0 - 35 U/L Final   Total Protein 07/01/2021 6.7  6.0 - 8.3 g/dL Final   Albumin 07/01/2021 4.0  3.5 - 5.2 g/dL Final   GFR 07/01/2021 101.14  >60.00 mL/min Final   Calculated using the CKD-EPI Creatinine Equation (2021)   Calcium 07/01/2021 9.1  8.4 - 10.5 mg/dL Final   WBC 07/01/2021 4.8  4.0 - 10.5 K/uL Final   RBC 07/01/2021 4.79  3.87 - 5.11 Mil/uL Final   Platelets 07/01/2021 281.0  150.0 - 400.0 K/uL Final   Hemoglobin 07/01/2021 11.4 (L)  12.0 - 15.0 g/dL Final   HCT 07/01/2021 35.8 (L)  36.0 - 46.0 % Final   MCV 07/01/2021 74.8 (L)  78.0 - 100.0 fl Final   MCHC 07/01/2021 31.8  30.0 - 36.0 g/dL Final   RDW 07/01/2021 14.3  11.5 - 15.5 % Final    Allergies: Patient has no known allergies.  PTA Medications: (Not in a hospital admission)   Medical Decision Making  Patient is recommended for inpatient psychiatric treatment for crisis stabilization.  Patient will be [facility] to [facility] for continuous assessment while awaiting inpatient psychiatric bed to become available.  Lab Orders         Resp Panel by RT-PCR (Flu A&B, Covid) Anterior Nasal Swab         CBC with Differential/Platelet         Comprehensive metabolic panel         Ethanol         TSH         Pregnancy, urine         Lipid panel         POCT  Urine Drug Screen - (I-Screen)     Continue citalopram 20 mg/day for depression and anxiety -Hydroxyzine 25 mg 3 times daily as needed for anxiety -Trazodone 50 mg nightly as needed for sleep -Monitor patient for safety  Recommendations  Based on my evaluation the patient does not appear to have an emergency medical condition.  Ophelia Shoulder, NP 08/30/21  10:00 PM

## 2021-08-30 NOTE — ED Notes (Signed)
Pt is awake and laying down. She is calm and cooperative. No c/o pain or distress. Will continue to monitor for safety

## 2021-08-31 ENCOUNTER — Inpatient Hospital Stay (HOSPITAL_COMMUNITY)
Admission: AD | Admit: 2021-08-31 | Discharge: 2021-09-04 | DRG: 885 | Disposition: A | Discharge status: Home / Self Care | Payer: Federal, State, Local not specified - Other | Attending: Psychiatry | Admitting: Psychiatry

## 2021-08-31 ENCOUNTER — Encounter (HOSPITAL_COMMUNITY): Payer: Self-pay | Admitting: Nurse Practitioner

## 2021-08-31 DIAGNOSIS — Z87891 Personal history of nicotine dependence: Secondary | ICD-10-CM

## 2021-08-31 DIAGNOSIS — K429 Umbilical hernia without obstruction or gangrene: Secondary | ICD-10-CM | POA: Diagnosis present

## 2021-08-31 DIAGNOSIS — Z6841 Body Mass Index (BMI) 40.0 and over, adult: Secondary | ICD-10-CM

## 2021-08-31 DIAGNOSIS — F410 Panic disorder [episodic paroxysmal anxiety] without agoraphobia: Secondary | ICD-10-CM | POA: Diagnosis present

## 2021-08-31 DIAGNOSIS — G4733 Obstructive sleep apnea (adult) (pediatric): Secondary | ICD-10-CM | POA: Diagnosis present

## 2021-08-31 DIAGNOSIS — Z8249 Family history of ischemic heart disease and other diseases of the circulatory system: Secondary | ICD-10-CM

## 2021-08-31 DIAGNOSIS — Z9884 Bariatric surgery status: Secondary | ICD-10-CM

## 2021-08-31 DIAGNOSIS — F333 Major depressive disorder, recurrent, severe with psychotic symptoms: Principal | ICD-10-CM

## 2021-08-31 DIAGNOSIS — I10 Essential (primary) hypertension: Secondary | ICD-10-CM | POA: Diagnosis present

## 2021-08-31 DIAGNOSIS — Z91411 Personal history of adult psychological abuse: Secondary | ICD-10-CM | POA: Diagnosis not present

## 2021-08-31 DIAGNOSIS — K30 Functional dyspepsia: Secondary | ICD-10-CM | POA: Diagnosis present

## 2021-08-31 DIAGNOSIS — F419 Anxiety disorder, unspecified: Secondary | ICD-10-CM | POA: Diagnosis present

## 2021-08-31 DIAGNOSIS — R110 Nausea: Secondary | ICD-10-CM | POA: Diagnosis present

## 2021-08-31 DIAGNOSIS — G4700 Insomnia, unspecified: Secondary | ICD-10-CM | POA: Diagnosis present

## 2021-08-31 DIAGNOSIS — F332 Major depressive disorder, recurrent severe without psychotic features: Secondary | ICD-10-CM | POA: Diagnosis present

## 2021-08-31 DIAGNOSIS — Z62810 Personal history of physical and sexual abuse in childhood: Secondary | ICD-10-CM | POA: Diagnosis present

## 2021-08-31 DIAGNOSIS — J45909 Unspecified asthma, uncomplicated: Secondary | ICD-10-CM

## 2021-08-31 DIAGNOSIS — Z79899 Other long term (current) drug therapy: Secondary | ICD-10-CM

## 2021-08-31 DIAGNOSIS — E119 Type 2 diabetes mellitus without complications: Secondary | ICD-10-CM | POA: Diagnosis present

## 2021-08-31 DIAGNOSIS — Z20822 Contact with and (suspected) exposure to covid-19: Secondary | ICD-10-CM | POA: Diagnosis present

## 2021-08-31 DIAGNOSIS — M545 Low back pain, unspecified: Secondary | ICD-10-CM | POA: Diagnosis present

## 2021-08-31 DIAGNOSIS — Z833 Family history of diabetes mellitus: Secondary | ICD-10-CM

## 2021-08-31 LAB — RESP PANEL BY RT-PCR (FLU A&B, COVID) ARPGX2
Influenza A by PCR: NEGATIVE
Influenza B by PCR: NEGATIVE
SARS Coronavirus 2 by RT PCR: NEGATIVE

## 2021-08-31 LAB — POC SARS CORONAVIRUS 2 AG: SARSCOV2ONAVIRUS 2 AG: NEGATIVE

## 2021-08-31 LAB — POCT PREGNANCY, URINE: Preg Test, Ur: NEGATIVE

## 2021-08-31 MED ORDER — ESCITALOPRAM OXALATE 10 MG PO TABS
10.0000 mg | ORAL_TABLET | Freq: Every day | ORAL | Status: AC
  Administered 2021-09-01 – 2021-09-03 (×3): 10 mg via ORAL
  Filled 2021-08-31 (×4): qty 1

## 2021-08-31 MED ORDER — ESCITALOPRAM OXALATE 10 MG PO TABS
20.0000 mg | ORAL_TABLET | Freq: Every day | ORAL | Status: DC
  Administered 2021-08-31: 20 mg via ORAL
  Filled 2021-08-31: qty 2

## 2021-08-31 MED ORDER — ESCITALOPRAM OXALATE 20 MG PO TABS
20.0000 mg | ORAL_TABLET | Freq: Every day | ORAL | Status: DC
  Filled 2021-08-31: qty 1

## 2021-08-31 MED ORDER — ALUM & MAG HYDROXIDE-SIMETH 200-200-20 MG/5ML PO SUSP: 30.0000 mL | ORAL | Status: DC | PRN

## 2021-08-31 MED ORDER — NORETHIN ACE-ETH ESTRAD-FE 1-20 MG-MCG PO TABS
1.0000 | ORAL_TABLET | Freq: Every day | ORAL | Status: DC
  Administered 2021-08-31 – 2021-09-04 (×5): 1 via ORAL

## 2021-08-31 MED ORDER — CLONIDINE HCL 0.1 MG PO TABS
0.1000 mg | ORAL_TABLET | Freq: Once | ORAL | Status: AC
Start: 2021-08-31 — End: 2021-08-31
  Administered 2021-08-31: 0.1 mg via ORAL
  Filled 2021-08-31 (×2): qty 1

## 2021-08-31 MED ORDER — MAGNESIUM HYDROXIDE 400 MG/5ML PO SUSP
30.0000 mL | Freq: Every day | ORAL | Status: DC | PRN
  Administered 2021-09-03: 30 mL via ORAL
  Filled 2021-08-31: qty 30

## 2021-08-31 MED ORDER — DULOXETINE HCL 30 MG PO CPEP
30.0000 mg | ORAL_CAPSULE | Freq: Every day | ORAL | Status: DC
  Administered 2021-09-01 – 2021-09-03 (×3): 30 mg via ORAL
  Filled 2021-08-31 (×5): qty 1

## 2021-08-31 MED ORDER — TRAZODONE HCL 50 MG PO TABS: 50.0000 mg | ORAL_TABLET | Freq: Every evening | ORAL | Status: DC | PRN

## 2021-08-31 MED ORDER — MELATONIN 3 MG PO TABS
3.0000 mg | ORAL_TABLET | Freq: Every day | ORAL | Status: DC
  Administered 2021-08-31 – 2021-09-03 (×4): 3 mg via ORAL
  Filled 2021-08-31 (×4): qty 1
  Filled 2021-08-31: qty 7
  Filled 2021-08-31 (×2): qty 1

## 2021-08-31 MED ORDER — HYDROXYZINE HCL 25 MG PO TABS
25.0000 mg | ORAL_TABLET | Freq: Three times a day (TID) | ORAL | Status: DC | PRN
  Administered 2021-08-31 – 2021-09-04 (×7): 25 mg via ORAL
  Filled 2021-08-31 (×5): qty 1
  Filled 2021-08-31: qty 10
  Filled 2021-08-31 (×2): qty 1

## 2021-08-31 MED ORDER — ACETAMINOPHEN 325 MG PO TABS
650.0000 mg | ORAL_TABLET | Freq: Four times a day (QID) | ORAL | Status: DC | PRN
  Administered 2021-09-01 – 2021-09-03 (×2): 650 mg via ORAL
  Filled 2021-08-31 (×2): qty 2

## 2021-08-31 MED ORDER — ALBUTEROL SULFATE HFA 108 (90 BASE) MCG/ACT IN AERS: 2.0000 | INHALATION_SPRAY | RESPIRATORY_TRACT | Status: DC | PRN

## 2021-08-31 MED ORDER — ARIPIPRAZOLE 5 MG PO TABS
5.0000 mg | ORAL_TABLET | Freq: Every day | ORAL | Status: DC
  Administered 2021-08-31 – 2021-09-03 (×4): 5 mg via ORAL
  Filled 2021-08-31: qty 1
  Filled 2021-08-31: qty 7
  Filled 2021-08-31 (×5): qty 1

## 2021-08-31 MED ORDER — ONDANSETRON 4 MG PO TBDP
4.0000 mg | ORAL_TABLET | Freq: Four times a day (QID) | ORAL | Status: DC | PRN
  Administered 2021-08-31 – 2021-09-03 (×3): 4 mg via ORAL
  Filled 2021-08-31 (×3): qty 1

## 2021-08-31 MED ORDER — LISINOPRIL 10 MG PO TABS
10.0000 mg | ORAL_TABLET | Freq: Every day | ORAL | Status: DC
  Administered 2021-09-01: 10 mg via ORAL
  Filled 2021-08-31 (×3): qty 1

## 2021-08-31 NOTE — Progress Notes (Signed)
Patient is a 31 year old female who presented voluntarily from the [facility] for SI with a plan to OD on her medications. Pt reported that she has been depressed for years, and over the past week she has been having suicidal thoughts due to feeling like she was "losing everything" (car, housing, and was still grieving the loss of her grandmother). Pt endorsed feelings of hopelessness, irritability, insomnia, and loneliness. Pt denied nicotine/alcohol/ drug use, but endorsed smoking marijuana. Patient presented with a sad affect/ depressed mood and calm, cooperative behavior, answered questions logically and coherently during admission interview and assessment. Pt currently denies SI/HI and A/VH, but reported that she has had AH in the past: voices telling her to hurt herself. VS monitored and recorded. Skin check performed with MHT and revealed no abnormalities. Admission paperwork completed and signed. Belongings searched and secured in locker. Patient was oriented to unit and schedule. Q 15 min checks initiated for safety. Lunch and po fluids given to patient.

## 2021-08-31 NOTE — H&P (Addendum)
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 Improvement in symptoms so as ready for discharge  Short Term Goals: Ability to identify changes in lifestyle to reduce recurrence of condition will improve, Ability to verbalize feelings will improve, Ability to disclose and discuss suicidal ideas, Ability to identify and develop effective coping behaviors will improve, Compliance with prescribed medications will improve, and Ability to identify triggers associated with substance abuse/mental health issues will improve  Physician Treatment Plan for Secondary Diagnosis:  Principal Problem:   MDD (major depressive disorder), recurrent, severe, with psychosis ([place]) Active  Problems:   Severe obesity (BMI >= 40) (HCC)   HYPERTENSION, BENIGN SYSTEMIC   Obstructive sleep apnea   Asthma   Anxiety  Severe recurrent major depressive disorder with psychotic features ([place]) -Wean off Lexapro-Give 10 mg x 3 days and stop -Start Cymbalta 30 mg starting 8/6 for MDD titrating up as she comes off Lexapro (r/b/se/a to med reviewed and she consents to med trial) -Start Abilify 5 mg nightly for psychosis and augmentation of depression (r/b/se/a to med reviewed and she consents to med trial) - Checking A1c, EKG with start of antipsychotic and Lipid panel reviewed.   Insomnia -Start Melatonin 3 mg nightly  Anxiety -Continue Hydroxyzine 25 mg every 6 hours PRN  Hypertension -Start Lisinopril 10 mg on 8/6 (historical med)  Other PRNS -Continue Tylenol 650 mg every 6 hours PRN for mild pain -Continue Maalox 30 mg every 4 hrs PRN for indigestion -Continue Zofran disintegrating tabs every 6 hrs PRN for nausea  -Start Albuterol inhaler Q 4 H PRN for wheezing/SOB  Discharge Planning: Social work and case management to assist with discharge planning and identification of hospital follow-up needs prior to discharge Estimated LOS: 5-7 days Discharge Concerns: Need to establish a safety plan; Medication compliance and effectiveness Discharge Goals: Return home with  outpatient referrals for mental health follow-up including medication management/psychotherapy  I certify that inpatient services furnished can reasonably be expected to improve the patient's condition.    Nicholes Rough, NP 8/5/20235:26 PM

## 2021-08-31 NOTE — BHH Group Notes (Signed)
Pt was invited but did not attend wrap up group this evening.

## 2021-08-31 NOTE — ED Notes (Signed)
Pt refused breakfast 

## 2021-08-31 NOTE — ED Provider Notes (Signed)
FBC/OBS ASAP Discharge Summary  Date and Time: 08/31/2021 8:26 AM  Name: Laura Benson  MRN:  [id number]   Discharge Diagnoses:  Final diagnoses:  MDD (major depressive disorder), severe ([place])    Subjective: Patient seen face-to-face by this provider, and chart reviewed. On evaluation, patient is alert and oriented x 4. Her thought process is logical and speech is clear and coherent at a moderate tone. Her mood is depressed and affect is congruent.  Today, she denies suicidal ideations. She states that she sent her sister a suicidal message and that her sister called the police for a wellness check. She reports feeling suicidal for a couple of days. She states that she feels like she is losing everything, her car, her place, and she is still grieving the loss of her grandmother. She describes her depressive symptoms as feelings of sadness, hopelessness, worthlessness, irritability, isolating, and anhedonia. She states that she is always depressed that she has been depressed since 2008. She reports poor sleep. She reports a fair appetite.  She denies HI/AVH at this time. There is no objective evidence that the patient is currently responding to internal or external stimuli. She reports smoking marijuana. She denies drinking alcohol. Patient denies medication side effects at this time.  Stay Summary:  Candiss Heppler is a 31 year old female with psychiatric history significant for depression, anxiety, and suicidal ideation. Patient was brought to [facility] voluntarily via law enforcement after her sister called the police due to a concerning message the patient sent her sister. Patient was admitted to the [facility] continuous assessment unit and recommended for inpatient treatment. Patient is voluntary. Labs obtained included CBC, CMP, BAL, lipid panel, urine pregnancy, TSH, A1c, and COVID.  Urine drug screen positive for marijuana. Patient was restarted on her home medications escitalopram 20 mg p.o. daily.    Total Time spent with patient: 30 minutes  Past Psychiatric History: History of MDD and GAD. Hospitalized at [facility] in 2019. Past Medical History:  Past Medical History:  Diagnosis Date   Anxiety    Asthma    Complication of anesthesia    Take for a while to wake up   Depression    Diabetes mellitus without complication ([place])    Hypertension    Morbid obesity ([place])    Sleep apnea    CPAP   Tachycardia     Past Surgical History:  Procedure Laterality Date   ADENOIDECTOMY     LAPAROSCOPIC GASTRIC SLEEVE RESECTION N/A 04/16/2020   Procedure: LAPAROSCOPIC GASTRIC SLEEVE RESECTION;  Surgeon: Greer Pickerel, MD;  Location: [facility] ORS;  Service: General;  Laterality: N/A;   TONSILLECTOMY     UPPER GI ENDOSCOPY N/A 04/16/2020   Procedure: UPPER GI ENDOSCOPY;  Surgeon: Greer Pickerel, MD;  Location: [facility];  Service: General;  Laterality: N/A;   Family History:  Family History  Problem Relation Age of Onset   Hypertension Mother    Diabetes Mother    Colon cancer Father    Hypertension Maternal Grandmother    Hypertension Paternal Grandmother    Diabetes Paternal Grandmother    Breast cancer Cousin    Family Psychiatric History: No history reported.  Social History:  Social History   Substance and Sexual Activity  Alcohol Use No     Social History   Substance and Sexual Activity  Drug Use No    Social History   Socioeconomic History   Marital status: Single    Spouse name: Not on file   Number of children: 0  Years of education: Not on file   Highest education level: Not on file  Occupational History    Employer: [employer]  Tobacco Use   Smoking status: Former    Years: 2.00    Types: Cigarettes   Smokeless tobacco: Never  Vaping Use   Vaping Use: Never used  Substance and Sexual Activity   Alcohol use: No   Drug use: No   Sexual activity: Yes    Birth control/protection: None  Other Topics Concern   Not on file  Social History Narrative   Not on file   Social  Determinants of Health   Financial Resource Strain: Not on file  Food Insecurity: Not on file  Transportation Needs: Not on file  Physical Activity: Not on file  Stress: Not on file  Social Connections: Not on file   SDOH:  SDOH Screenings   Alcohol Screen: Medium Risk (05/05/2017)   Alcohol Screen    Last Alcohol Screening Score (AUDIT): 11  Depression (PHQ2-9): Medium Risk (07/01/2021)   Depression (PHQ2-9)    PHQ-2 Score: 14  Financial Resource Strain: Not on file  Food Insecurity: Not on file  Housing: Not on file  Physical Activity: Not on file  Social Connections: Not on file  Stress: Not on file  Tobacco Use: Medium Risk (07/31/2021)   Patient History    Smoking Tobacco Use: Former    Smokeless Tobacco Use: Never    Passive Exposure: Not on file  Transportation Needs: Not on file    Tobacco Cessation:  N/A, patient does not currently use tobacco products  Current Medications:  Current Facility-Administered Medications  Medication Dose Route Frequency Provider Last Rate Last Admin   acetaminophen (TYLENOL) tablet 650 mg  650 mg Oral Q6H PRN Ajibola, Ene A, NP   650 mg at 08/31/21 0821   alum & mag hydroxide-simeth (MAALOX/MYLANTA) 200-200-20 MG/5ML suspension 30 mL  30 mL Oral Q4H PRN Ajibola, Ene A, NP       escitalopram (LEXAPRO) tablet 20 mg  20 mg Oral Daily Ajibola, Ene A, NP   20 mg at 08/31/21 0820   hydrOXYzine (ATARAX) tablet 25 mg  25 mg Oral TID PRN Ajibola, Ene A, NP   25 mg at 08/31/21 1610   magnesium hydroxide (MILK OF MAGNESIA) suspension 30 mL  30 mL Oral Daily PRN Ajibola, Ene A, NP       traZODone (DESYREL) tablet 50 mg  50 mg Oral QHS PRN Ajibola, Ene A, NP   50 mg at 08/30/21 2244   Current Outpatient Medications  Medication Sig Dispense Refill   escitalopram (LEXAPRO) 20 MG tablet TAKE 1 TABLET BY MOUTH EVERY DAY 90 tablet 1   Lancets (ONETOUCH DELICA PLUS LANCET33G) MISC USE DAILY TO CHECK BLOOD SUGAR 100 each 12   norethindrone-ethinyl  estradiol-FE (LOESTRIN FE) 1-20 MG-MCG tablet Take 1 tablet by mouth daily. 28 tablet 11   ONETOUCH VERIO test strip USE AS DAILY TO CHECK BLOOD SUGAR. 100 strip 12    PTA Medications: (Not in a hospital admission)      07/01/2021    1:06 PM 06/27/2020   10:27 AM 04/13/2019    8:23 AM  Depression screen PHQ 2/9  Decreased Interest 3 3 1   Down, Depressed, Hopeless 3 3 1   PHQ - 2 Score 6 6 2   Altered sleeping 3 3   Tired, decreased energy 2 3   Change in appetite 0 3   Feeling bad or failure about yourself  1 1  Trouble concentrating 2 3   Moving slowly or fidgety/restless 0 1   Suicidal thoughts 0 0   PHQ-9 Score 14 20   Difficult doing work/chores Somewhat difficult Very difficult     Flowsheet Row ED from 08/30/2021 in [facility] ED from 07/29/2021 in [facility] from 07/26/2021 in [facility]  C-SSRS RISK CATEGORY High Risk No Risk No Risk       Musculoskeletal  Strength & Muscle Tone: within normal limits Gait & Station: normal Patient leans: N/A  Psychiatric Specialty Exam  Presentation  General Appearance: Appropriate for Environment  Eye Contact:Fair  Speech:Clear and Coherent  Speech Volume:Normal  Handedness:Right   Mood and Affect  Mood:Depressed  Affect:Congruent   Thought Process  Thought Processes:Coherent  Descriptions of Associations:Intact  Orientation:Full (Time, Place and Person)  Thought Content:WDL     Hallucinations:Hallucinations: None Description of Auditory Hallucinations: "just do it, quit your job, you don't need it."  Ideas of Reference:None  Suicidal Thoughts:Suicidal Thoughts: No SI Active Intent and/or Plan: With Plan  Homicidal Thoughts:Homicidal Thoughts: No   Sensorium  Memory:Immediate Fair; Recent Fair; Remote Fair  Judgment:Fair  Insight:Fair   Executive Functions  Concentration:Fair  Attention  Span:Fair  Recall:Fair  Fund of Knowledge:Fair  Language:Fair   Psychomotor Activity  Psychomotor Activity:Psychomotor Activity: Normal   Assets  Assets:Communication Skills; Desire for Improvement; Financial Resources/Insurance; Housing; Intimacy; Leisure Time; Physical Health   Sleep  Sleep:Sleep: Poor Number of Hours of Sleep: 5   Nutritional Assessment (For OBS and FBC admissions only) Has the patient had a weight loss or gain of 10 pounds or more in the last 3 months?: No Has the patient had a decrease in food intake/or appetite?: Yes Does the patient have dental problems?: No Does the patient have eating habits or behaviors that may be indicators of an eating disorder including binging or inducing vomiting?: No Has the patient recently lost weight without trying?: 0 Has the patient been eating poorly because of a decreased appetite?: 0 Malnutrition Screening Tool Score: 0    Physical Exam  Physical Exam HENT:     Head: Normocephalic.     Nose: Nose normal.  Eyes:     Conjunctiva/sclera: Conjunctivae normal.  Cardiovascular:     Rate and Rhythm: Normal rate.     Comments: Hypertensive  Pulmonary:     Effort: Pulmonary effort is normal.  Musculoskeletal:        General: Normal range of motion.     Cervical back: Normal range of motion.  Neurological:     Mental Status: She is alert and oriented to person, place, and time.    Review of Systems  Constitutional: Negative.   HENT: Negative.    Eyes: Negative.   Respiratory: Negative.    Cardiovascular: Negative.   Gastrointestinal: Negative.   Genitourinary: Negative.   Musculoskeletal:        Sciatica pain  Skin: Negative.   Neurological: Negative.   Endo/Heme/Allergies: Negative.    Blood pressure (!) 154/85, pulse 65, temperature 98.4 F (36.9 C), resp. rate 16, SpO2 99 %. There is no height or weight on file to calculate BMI.   Plan Of Care/Follow-up recommendations:  Activity:  as  tolerated  Disposition: Patient is accepted to [facility] after 10 am today. Accepting MD is Dr. Dara Hoyer. Patient is voluntary. EMTALA completed. Orders reviewed. Labs reviewed.   Daion Ginsberg L, NP 08/31/2021, 8:26 AM

## 2021-08-31 NOTE — Tx Team (Signed)
Initial Treatment Plan 08/31/2021 4:24 PM [name] [id number]    PATIENT STRESSORS: Financial difficulties   Health problems   Loss of grandmother   Other: mental health     PATIENT STRENGTHS: Average or above average intelligence  Capable of independent living  Communication skills  Motivation for treatment/growth  Supportive family/friends  Work skills    PATIENT IDENTIFIED PROBLEMS: Suicidal ideation    Hopelessness    "Feel like I'm losing everything"    Worrying         DISCHARGE CRITERIA:  Adequate post-discharge living arrangements Improved stabilization in mood, thinking, and/or behavior Reduction of life-threatening or endangering symptoms to within safe limits Verbal commitment to aftercare and medication compliance  PRELIMINARY DISCHARGE PLAN: Outpatient therapy Return to previous living arrangement Return to previous work or school arrangements  PATIENT/FAMILY INVOLVEMENT: This treatment plan has been presented to and reviewed with the patient, Laura Benson,  The patient has been given the opportunity to ask questions and make suggestions.  Shela Nevin, RN 08/31/2021, 4:24 PM

## 2021-08-31 NOTE — ED Notes (Signed)
Called report to [place] at [facility]. Awaiting transport from safe transport to transfer to [facility].

## 2021-08-31 NOTE — Progress Notes (Signed)
   08/31/21 2215  Psych Admission Type (Psych Patients Only)  Admission Status Voluntary  Psychosocial Assessment  Patient Complaints Depression  Eye Contact Brief  Facial Expression Flat  Affect Appropriate to circumstance  Speech Logical/coherent  Interaction Minimal  Motor Activity Other (Comment) (slow)  Appearance/Hygiene Unremarkable  Behavior Characteristics Appropriate to situation  Mood Depressed  Thought Process  Coherency WDL  Content WDL  Delusions None reported or observed  Hallucination None reported or observed  Judgment Impaired  Confusion None  Danger to Self  Current suicidal ideation? Denies  Self-Injurious Behavior No self-injurious ideation or behavior indicators observed or expressed   Agreement Not to Harm Self Yes  Description of Agreement verbal  Danger to Others  Danger to Others None reported or observed

## 2021-08-31 NOTE — BH Assessment (Signed)
Updated disposition: Per Tosin, AC, RN pt has been accepted 300-02 at 1000 tomorrow. Pls have the voluntary consent faxed to [phone].    Redmond Pulling, MS, [facility], [facility] Triage Specialist [phone]
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
## 2021-08-31 NOTE — ED Notes (Signed)
Pt A&O x 4, presents with depression and suicidal thoughts, plan to overdose on medications.  Pt unable to contract for safety. Financial stressors and the loss of her grandmother.  Monitoring for safety.

## 2021-08-31 NOTE — ED Notes (Signed)
Rechecked BP in both arms, left arm 156/84 and right arm 158/86. Provider aware.  Pt denies headache, blurry vision or any abnormal s/s. States her eyes feel funny and swollen but she had been crying a lot prior to admission.   C/o sciatica type pain in back, rating it a 7/10. Tylenol given PRN, along with Atarax 25mg  for anxiety.

## 2021-08-31 NOTE — ED Notes (Signed)
Patient discharged to go to [facility].

## 2021-08-31 NOTE — BH Assessment (Incomplete)
Per Tosin, AC, RN pt has been accepted 300-02 at 1000 tomorrow. Pls have the voluntary consent faxed to [phone].    Redmond Pulling, MS, [facility], [facility] Triage Specialist [phone]

## 2021-08-31 NOTE — ED Provider Notes (Incomplete)
[facility] Continuous Assessment Admission H&P  Date: 08/30/21 Patient Name: Laura Benson MRN: [id number] Chief Complaint:  Chief Complaint  Patient presents with  . suicidal ideation  . Depression  . Stress      Diagnoses:  Final diagnoses:  None    HPI: Rosmarie Esquibel  PHQ 2-9:  Flowsheet Row Office Visit from 07/01/2021 in [facility] Office Visit from 06/27/2020 in [facility] Office Visit from 10/01/2016 in [facility] [place]  Thoughts that you would be better off dead, or of hurting yourself in some way Not at all Not at all Not at all  PHQ-9 Total Score 14 20 12        Flowsheet Row ED from 07/29/2021 in [facility] ED from 07/26/2021 in [facility]  HOSPITAL-EMERGENCY DEPT ED from 07/08/2021 in [facility]  C-SSRS RISK CATEGORY No Risk No Risk No Risk        Total Time spent with patient: {Time; 15 min - 8 hours:17441}  Musculoskeletal  Strength & Muscle Tone: {desc; muscle tone:32375} Gait & Station: {PE GAIT ED 09/07/2021 Patient leans: {Patient Leans:[id number]}  Psychiatric Specialty Exam  Presentation General Appearance: Appropriate for Environment  Eye Contact:Good  Speech:Clear and Coherent  Speech Volume:Normal  Handedness:Right   Mood and Affect  Mood:Depressed  Affect:Congruent   Thought Process  Thought Processes:Coherent  Descriptions of Associations:Intact  Orientation:Full (Time, Place and Person)  Thought Content:WDL    Hallucinations:Hallucinations: Auditory Description of Auditory Hallucinations: "just do it, quit your job, you don't need it."  Ideas of Reference:None  Suicidal Thoughts:Suicidal Thoughts: Yes, Active SI Active Intent and/or Plan: With Plan  Homicidal Thoughts:Homicidal Thoughts: No   Sensorium  Memory:Immediate Good; Remote  Good; Recent Good  Judgment:Fair  Insight:Good   Executive Functions  Concentration:Fair  Attention Span:Fair  Recall:Good  Fund of Knowledge:Good  Language:Good   Psychomotor Activity  Psychomotor Activity:Psychomotor Activity: Normal   Assets  Assets:Communication Skills; Desire for Improvement; Housing; Physical Health; Social Support; Vocational/Educational   Sleep  Sleep:Sleep: Poor Number of Hours of Sleep: 4   Nutritional Assessment (For OBS and FBC admissions only) Has the patient had a weight loss or gain of 10 pounds or more in the last 3 months?: No Has the patient had a decrease in food intake/or appetite?: Yes Does the patient have dental problems?: No Does the patient have eating habits or behaviors that may be indicators of an eating disorder including binging or inducing vomiting?: No Has the patient recently lost weight without trying?: 0 Has the patient been eating poorly because of a decreased appetite?: 0 Malnutrition Screening Tool Score: 0    Physical Exam ROS  Blood pressure (!) 160/101, pulse 65, temperature 98.3 F (36.8 C), temperature source Oral, resp. rate 16, SpO2 100 %. There is no height or weight on file to calculate BMI.  Past Psychiatric History: ***   Is the patient at risk to self? {YES/NO:21197} Has the patient been a risk to self in the past 6 months? {YES/NO:21197}.    Has the patient been a risk to self within the distant past? {YES/NO:21197}  Is the patient a risk to others? {YES/NO:21197}  Has the patient been a risk to others in the past 6 months? {YES/NO:21197}  Has the patient been a risk to others within the distant past? {YES/NO:21197}  Past Medical History:  Past Medical History:  Diagnosis Date  .  Anxiety   . Asthma   . Complication of anesthesia    Take for a while to wake up  . Depression   . Diabetes mellitus without complication (HCC)   . Hypertension   . Morbid obesity (HCC)   . Sleep apnea     CPAP  . Tachycardia     Past Surgical History:  Procedure Laterality Date  . ADENOIDECTOMY    . LAPAROSCOPIC GASTRIC SLEEVE RESECTION N/A 04/16/2020   Procedure: LAPAROSCOPIC GASTRIC SLEEVE RESECTION;  Surgeon: [name], MD;  Location: [facility] ORS;  Service: General;  Laterality: N/A;  . TONSILLECTOMY    . UPPER GI ENDOSCOPY N/A 04/16/2020   Procedure: UPPER GI ENDOSCOPY;  Surgeon: [name], MD;  Location: [facility] ORS;  Service: General;  Laterality: N/A;    Family History:  Family History  Problem Relation Age of Onset  . Hypertension Mother   . Diabetes Mother   . Colon cancer Father   . Hypertension Maternal Grandmother   . Hypertension Paternal Grandmother   . Diabetes Paternal Grandmother   . Breast cancer Cousin     Social History:  Social History   Socioeconomic History  . Marital status: Single    Spouse name: Not on file  . Number of children: 0  . Years of education: Not on file  . Highest education level: Not on file  Occupational History    Employer: [employer]  Tobacco Use  . Smoking status: Former    Years: 2.00    Types: Cigarettes  . Smokeless tobacco: Never  Vaping Use  . Vaping Use: Never used  Substance and Sexual Activity  . Alcohol use: No  . Drug use: No  . Sexual activity: Yes    Birth control/protection: None  Other Topics Concern  . Not on file  Social History Narrative  . Not on file   Social Determinants of Health   Financial Resource Strain: Not on file  Food Insecurity: Not on file  Transportation Needs: Not on file  Physical Activity: Not on file  Stress: Not on file  Social Connections: Not on file  Intimate Partner Violence: Not on file    SDOH:  SDOH Screenings   Alcohol Screen: Medium Risk (05/05/2017)   Alcohol Screen   . Last Alcohol Screening Score (AUDIT): 11  Depression (PHQ2-9): Medium Risk (07/01/2021)   Depression (PHQ2-9)   . PHQ-2 Score: 14  Financial Resource Strain: Not on file  Food Insecurity: Not on file   Housing: Not on file  Physical Activity: Not on file  Social Connections: Not on file  Stress: Not on file  Tobacco Use: Medium Risk (07/31/2021)   Patient History   . Smoking Tobacco Use: Former   . Smokeless Tobacco Use: Never   . Passive Exposure: Not on file  Transportation Needs: Not on file    Last Labs:  Admission on 07/29/2021, Discharged on 07/29/2021  Component Date Value Ref Range Status  . WBC 07/29/2021 4.5  4.0 - 10.5 K/uL Final  . RBC 07/29/2021 4.95  3.87 - 5.11 MIL/uL Final  . Hemoglobin 07/29/2021 11.6 (L)  12.0 - 15.0 g/dL Final  . HCT 91/47/8295 37.5  36.0 - 46.0 % Final  . MCV 07/29/2021 75.8 (L)  80.0 - 100.0 fL Final  . MCH 07/29/2021 23.4 (L)  26.0 - 34.0 pg Final  . MCHC 07/29/2021 30.9  30.0 - 36.0 g/dL Final  . RDW 62/13/0865 14.0  11.5 - 15.5 % Final  . Platelets 07/29/2021  352  150 - 400 K/uL Final  . nRBC 07/29/2021 0.0  0.0 - 0.2 % Final   Performed at [facility], [address] [place], [place] [address]  . Sodium 07/29/2021 137  135 - 145 mmol/L Final  . Potassium 07/29/2021 3.8  3.5 - 5.1 mmol/L Final  . Chloride 07/29/2021 109  98 - 111 mmol/L Final  . CO2 07/29/2021 23  22 - 32 mmol/L Final  . Glucose, Bld 07/29/2021 171 (H)  70 - 99 mg/dL Final   Glucose reference range applies only to samples taken after fasting for at least 8 hours.  . BUN 07/29/2021 11  6 - 20 mg/dL Final  . Creatinine, Ser 07/29/2021 0.94  0.44 - 1.00 mg/dL Final  . Calcium 54/56/2563 8.7 (L)  8.9 - 10.3 mg/dL Final  . GFR, Estimated 07/29/2021 >60  >60 mL/min Final   Comment: (NOTE) Calculated using the CKD-EPI Creatinine Equation (2021)   . Anion gap 07/29/2021 5  5 - 15 Final   Performed at [facility], [address] [place], [place] [address]  Admission on 07/08/2021, Discharged on 07/08/2021  Component Date Value Ref Range Status  . hCG, Beta Chain, Quant, S 07/08/2021 <1  <5 mIU/mL Final   Comment:          GEST.  AGE      CONC.  (mIU/mL)   <=1 WEEK        5 - 50     2 WEEKS       50 - 500     3 WEEKS       100 - 10,000     4 WEEKS     1,000 - 30,000     5 WEEKS     3,500 - 115,000   6-8 WEEKS     12,000 - 270,000    12 WEEKS     15,000 - 220,000        FEMALE AND NON-PREGNANT FEMALE:     LESS THAN 5 mIU/mL Performed at [facility], [address] [place], [place] [address]   Office Visit on 07/01/2021  Component Date Value Ref Range Status  . Hgb A1c MFr Bld 07/01/2021 6.5  4.6 - 6.5 % Final   Glycemic Control Guidelines for People with Diabetes:Non Diabetic:  <6%Goal of Therapy: <7%Additional Action Suggested:  >8%   . Microalb, Ur 07/01/2021 1.9  0.0 - 1.9 mg/dL Final  . Creatinine,U 81/15/7262 239.7  mg/dL Final  . Microalb Creat Ratio 07/01/2021 0.8  0.0 - 30.0 mg/g Final  . Cholesterol 07/01/2021 168  0 - 200 mg/dL Final   ATP III Classification       Desirable:  < 200 mg/dL               Borderline High:  200 - 239 mg/dL          High:  > = 035 mg/dL  . Triglycerides 07/01/2021 90.0  0.0 - 149.0 mg/dL Final   Normal:  <597 mg/dLBorderline High:  150 - 199 mg/dL  . HDL 07/01/2021 42.90  >39.00 mg/dL Final  . VLDL 41/63/8453 18.0  0.0 - 40.0 mg/dL Final  . LDL Cholesterol 07/01/2021 107 (H)  0 - 99 mg/dL Final  . Total CHOL/HDL Ratio 07/01/2021 4   Final                  Men  Women1/2 Average Risk     3.4          3.3Average Risk          5.0          4.42X Average Risk          9.6          7.13X Average Risk          15.0          11.0                      . NonHDL 07/01/2021 125.06   Final   NOTE:  Non-HDL goal should be 30 mg/dL higher than patient's LDL goal (i.e. LDL goal of < 70 mg/dL, would have non-HDL goal of < 100 mg/dL)  . Sodium 07/01/2021 139  135 - 145 mEq/L Final  . Potassium 07/01/2021 4.1  3.5 - 5.1 mEq/L Final  . Chloride 07/01/2021 105  96 - 112 mEq/L Final  . CO2 07/01/2021 27  19 - 32 mEq/L Final  . Glucose, Bld 07/01/2021 99  70 - 99  mg/dL Final  . BUN 02/77/4128 9  6 - 23 mg/dL Final  . Creatinine, Ser 07/01/2021 0.78  0.40 - 1.20 mg/dL Final  . Total Bilirubin 07/01/2021 1.0  0.2 - 1.2 mg/dL Final  . Alkaline Phosphatase 07/01/2021 52  39 - 117 U/L Final  . AST 07/01/2021 12  0 - 37 U/L Final  . ALT 07/01/2021 9  0 - 35 U/L Final  . Total Protein 07/01/2021 6.7  6.0 - 8.3 g/dL Final  . Albumin 78/67/6720 4.0  3.5 - 5.2 g/dL Final  . GFR 94/70/9628 101.14  >60.00 mL/min Final   Calculated using the CKD-EPI Creatinine Equation (2021)  . Calcium 07/01/2021 9.1  8.4 - 10.5 mg/dL Final  . WBC 36/62/9476 4.8  4.0 - 10.5 K/uL Final  . RBC 07/01/2021 4.79  3.87 - 5.11 Mil/uL Final  . Platelets 07/01/2021 281.0  150.0 - 400.0 K/uL Final  . Hemoglobin 07/01/2021 11.4 (L)  12.0 - 15.0 g/dL Final  . HCT 54/65/0354 35.8 (L)  36.0 - 46.0 % Final  . MCV 07/01/2021 74.8 (L)  78.0 - 100.0 fl Final  . MCHC 07/01/2021 31.8  30.0 - 36.0 g/dL Final  . RDW 65/68/1275 14.3  11.5 - 15.5 % Final    Allergies: Patient has no known allergies.  PTA Medications: (Not in a hospital admission)   Medical Decision Making  ***    Recommendations  [facility] MSE Recommendations:[id number]}  Maricela Bo, NP 08/30/21  10:00 PM

## 2021-08-31 NOTE — ED Notes (Signed)
Pt sleeping at present, no distress noted.  Monitoring for safety. 

## 2021-08-31 NOTE — Discharge Instructions (Addendum)
Transfer to [facility] 

## 2021-09-01 LAB — VITAMIN B12: Vitamin B-12: 266 pg/mL (ref 180–914)

## 2021-09-01 LAB — HEMOGLOBIN A1C
Hgb A1c MFr Bld: 6.1 % — ABNORMAL HIGH (ref 4.8–5.6)
Mean Plasma Glucose: 128.37 mg/dL

## 2021-09-01 LAB — VITAMIN D 25 HYDROXY (VIT D DEFICIENCY, FRACTURES): Vit D, 25-Hydroxy: 26.6 ng/mL — ABNORMAL LOW (ref 30–100)

## 2021-09-01 LAB — SARS CORONAVIRUS 2 BY RT PCR: SARS Coronavirus 2 by RT PCR: NEGATIVE

## 2021-09-01 MED ORDER — LIDOCAINE 5 % EX PTCH
1.0000 | MEDICATED_PATCH | Freq: Every day | CUTANEOUS | Status: DC
  Administered 2021-09-01 – 2021-09-03 (×3): 1 via TRANSDERMAL
  Filled 2021-09-01 (×6): qty 1
  Filled 2021-09-01 (×2): qty 7

## 2021-09-01 MED ORDER — LISINOPRIL 20 MG PO TABS
20.0000 mg | ORAL_TABLET | Freq: Every day | ORAL | Status: DC
  Administered 2021-09-02: 20 mg via ORAL
  Filled 2021-09-01 (×3): qty 1

## 2021-09-01 MED ORDER — LIDOCAINE 5 % EX PTCH
1.0000 | MEDICATED_PATCH | CUTANEOUS | Status: DC
  Filled 2021-09-01: qty 1

## 2021-09-01 MED ORDER — LISINOPRIL 10 MG PO TABS
10.0000 mg | ORAL_TABLET | Freq: Once | ORAL | Status: AC
  Administered 2021-09-01: 10 mg via ORAL
  Filled 2021-09-01: qty 1

## 2021-09-01 NOTE — Group Note (Signed)
  [facility] LCSW Group Therapy Note  Date/Time:  09/01/2021 10:00AM-11:00AM  Type of Therapy and Topic:  Group Therapy:  Ways to Love Myself and Take Care of Myself  Participation Level:  None   Description of Group This process group started with group leader playing a song entitled "Love Me More" to facilitate a discussion about the need to love and respect ourselves and prioritize taking care of ourselves, especially after hospital discharge.   There was a discussion about the need for self-love, and patients listed ways in which they can demonstrate self-love.  Patients were then asked to share how they plan to take care of themselves in a better manner when they get home from the hospital.  Group members shared ideas about making changes when they return home so that they can stay well and in recovery.  Two more songs were played including "I Am Enough" and "My Own Hero" which led to further reinforcement of the ideas given.  Patients were emotional and/or supportive of those who became emotional.  Therapeutic Goals Patient will listen and be able to relate to a song about prioritizing themselves through self-love Patient will participate in generating ideas about healthy self-care options when they return to the community Patients will be supportive of one another and receive said support from others   Summary of Patient Progress:  The patient was not present until the last 10 minutes of group.  She was attentive at that time.   Therapeutic Modalities Activity Motivational Interviewing Processing   Ambrose Mantle, LCSW 09/01/2021, 12:00pm

## 2021-09-01 NOTE — [facility] Group Notes (Signed)
Patient did not attend group.

## 2021-09-01 NOTE — Progress Notes (Signed)
   09/01/21 1300  Psych Admission Type (Psych Patients Only)  Admission Status Voluntary  Psychosocial Assessment  Patient Complaints Depression  Eye Contact Brief  Facial Expression Sad  Affect Appropriate to circumstance  Speech Logical/coherent  Interaction Minimal  Motor Activity Slow  Appearance/Hygiene Unremarkable  Behavior Characteristics Cooperative;Appropriate to situation  Mood Depressed;Pleasant  Thought Process  Coherency WDL  Content WDL  Delusions None reported or observed  Perception WDL  Hallucination None reported or observed  Judgment Impaired  Confusion None  Danger to Self  Current suicidal ideation? Denies  Self-Injurious Behavior No self-injurious ideation or behavior indicators observed or expressed   Agreement Not to Harm Self Yes  Description of Agreement verbal  Danger to Others  Danger to Others None reported or observed

## 2021-09-01 NOTE — Group Note (Signed)
Date:  09/01/2021 Time:  10:14 AM  Group Topic/Focus:  Orientation:   The focus of this group is to educate the patient on the purpose and policies of crisis stabilization and provide a format to answer questions about their admission.  The group details unit policies and expectations of patients while admitted.    Participation Level:  Active  Participation Quality:  Appropriate  Affect:  Appropriate  Cognitive:  Appropriate  Insight: Appropriate  Engagement in Group:  Engaged  Modes of Intervention:  Discussion  Additional Comments:     Reymundo Poll 09/01/2021, 10:14 AM

## 2021-09-01 NOTE — Progress Notes (Signed)
[facility] MD Progress Note  09/01/2021 1:20 PM Laura Benson  MRN:  [id number]  HPI:  Laura Benson is a 31 year old female with past diagnoses of MDD, anxiety, hypertension and gastric sleeve surgery who presented to the [facility] health urgent care [facility]) accompanied by law enforcement, after her sister called the police due to concerning messages that pt sent to sister and other family members. Patient was admitted to the [facility] continuous assessment unit. She was recommended for inpatient treatment, and subsequently transferred and admitted voluntarily to this [facility] for treatment and stabilization of her mood.  24 Hour chart review: As per flow sheets, vital signs have been consistently elevated over the past 24 hrs, with SBP between 150-160s. Pt has attended some groups over the past 24 hrs. She has been appropriate and cooperative with care, and has taken all meds as scheduled over the past 24 hrs. She required Hydroxyzine 25 mg for anxiety last night, required Tylenol 650 mg earlier today morning for pain, and required Zofran 4 mg for nausea today afternoon. She has not required any agitation protocol medications for the past 24 hrs.  Today's patient assessment note: Pt seen in her room on the 300 hall for this assessment. She is lying in bed and presents with Pt with a euthymic mood, attention to personal hygiene and grooming is fair, eye contact is good, speech is clear & coherent. Thought contents are organized and logical, and pt currently denies SI/HI/AVH or paranoia. There is no evidence of delusional thoughts.  She reports pain of 7 (10 being worst) to her lower back area. We will start Lidoderm patch daily to her lower back for pain. Pt continues to report being depressed, but states that her depressive symptoms are resolving. We will increase Lisinopril to 20 mg daily starting today, with a goal to reach 40 mg daily which is the home dose. Pt reports that her PCP stopped this  medication a month ago because her hypertension was well controlled. She reports that she was also taking Hydrochlorothiazide, but this medication is not in chart review. Writer spoke with Dr. Ronaldo Miyamoto on the medicine team who recommends to continue upward titrations of Lisinopril.  Pt reports a good sleep quality last night, and reports a good sleep quality. She complains of nausea which is not new, but states that the Zofran 4 mg as needed is effective in relieving this. We will continue to monitor.  Principal Problem: MDD (major depressive disorder), recurrent, severe, with psychosis (HCC) Diagnosis: Principal Problem:   MDD (major depressive disorder), recurrent, severe, with psychosis (HCC) Active Problems:   Severe obesity (BMI >= 40) (HCC)   HYPERTENSION, BENIGN SYSTEMIC   Obstructive sleep apnea   Asthma   Anxiety  Total Time spent with patient: 30 minutes  Past Psychiatric History: MDD, GAD  Past Medical History:  Past Medical History:  Diagnosis Date   Anxiety    Asthma    Complication of anesthesia    Take for a while to wake up   Depression    Diabetes mellitus without complication (HCC)    Hypertension    Morbid obesity (HCC)    Sleep apnea    CPAP   Tachycardia     Past Surgical History:  Procedure Laterality Date   ADENOIDECTOMY     LAPAROSCOPIC GASTRIC SLEEVE RESECTION N/A 04/16/2020   Procedure: LAPAROSCOPIC GASTRIC SLEEVE RESECTION;  Surgeon: [name], MD;  Location: [facility] ORS;  Service: General;  Laterality: N/A;   TONSILLECTOMY  UPPER GI ENDOSCOPY N/A 04/16/2020   Procedure: UPPER GI ENDOSCOPY;  Surgeon: [name], MD;  Location: [facility];  Service: General;  Laterality: N/A;   Family History:  Family History  Problem Relation Age of Onset   Hypertension Mother    Diabetes Mother    Colon cancer Father    Hypertension Maternal Grandmother    Hypertension Paternal Grandmother    Diabetes Paternal Grandmother    Breast cancer Cousin    Family  Psychiatric  History: none reported Social History:  Social History   Substance and Sexual Activity  Alcohol Use No     Social History   Substance and Sexual Activity  Drug Use No    Social History   Socioeconomic History   Marital status: Single    Spouse name: Not on file   Number of children: 0   Years of education: Not on file   Highest education level: Not on file  Occupational History    Employer: [employer]  Tobacco Use   Smoking status: Former    Years: 2.00    Types: Cigarettes   Smokeless tobacco: Never  Vaping Use   Vaping Use: Never used  Substance and Sexual Activity   Alcohol use: No   Drug use: No   Sexual activity: Yes    Birth control/protection: None  Other Topics Concern   Not on file  Social History Narrative   Not on file   Social Determinants of Health   Financial Resource Strain: Not on file  Food Insecurity: Not on file  Transportation Needs: Not on file  Physical Activity: Not on file  Stress: Not on file  Social Connections: Not on file   Additional Social History:     Sleep: Good  Appetite:  Good  Current Medications: Current Facility-Administered Medications  Medication Dose Route Frequency Provider Last Rate Last Admin   acetaminophen (TYLENOL) tablet 650 mg  650 mg Oral Q6H PRN Bobbitt, Shalon E, NP   650 mg at 09/01/21 0825   albuterol (VENTOLIN HFA) 108 (90 Base) MCG/ACT inhaler 2 puff  2 puff Inhalation Q4H PRN Demetrios Byron, NP       alum & mag hydroxide-simeth (MAALOX/MYLANTA) 200-200-20 MG/5ML suspension 30 mL  30 mL Oral Q4H PRN Bobbitt, Shalon E, NP       ARIPiprazole (ABILIFY) tablet 5 mg  5 mg Oral QHS Excell Neyland, NP   5 mg at 08/31/21 2217   DULoxetine (CYMBALTA) DR capsule 30 mg  30 mg Oral Daily Starleen Blue, NP   30 mg at 09/01/21 6440   escitalopram (LEXAPRO) tablet 10 mg  10 mg Oral Daily Starleen Blue, NP   10 mg at 09/01/21 3474   hydrOXYzine (ATARAX) tablet 25 mg  25 mg Oral TID PRN Bobbitt, Shalon E, NP    25 mg at 08/31/21 2217   lidocaine (LIDODERM) 5 % 1 patch  1 patch Transdermal Daily Attiah, Nadir, MD       lisinopril (ZESTRIL) tablet 10 mg  10 mg Oral Once Starleen Blue, NP       [START ON 09/02/2021] lisinopril (ZESTRIL) tablet 20 mg  20 mg Oral Daily Cuca Benassi, NP       magnesium hydroxide (MILK OF MAGNESIA) suspension 30 mL  30 mL Oral Daily PRN Bobbitt, Shalon E, NP       melatonin tablet 3 mg  3 mg Oral QHS Ashlen Kiger, NP   3 mg at 08/31/21 2217   norethindrone-ethinyl estradiol-FE (LOESTRIN FE)  1-20 MG-MCG per tablet 1 tablet  1 tablet Oral Daily Abbott Pao, Nadir, MD   1 tablet at 09/01/21 0824   ondansetron (ZOFRAN-ODT) disintegrating tablet 4 mg  4 mg Oral Q6H PRN Starleen Blue, NP   4 mg at 09/01/21 1225   Lab Results:  Results for orders placed or performed during the hospital encounter of 08/31/21 (from the past 48 hour(s))  Hemoglobin A1c     Status: Abnormal   Collection Time: 09/01/21  6:58 AM  Result Value Ref Range   Hgb A1c MFr Bld 6.1 (H) 4.8 - 5.6 %    Comment: (NOTE) Pre diabetes:          5.7%-6.4%  Diabetes:              >6.4%  Glycemic control for   <7.0% adults with diabetes    Mean Plasma Glucose 128.37 mg/dL    Comment: Performed at [facility], [address], [place], [place] [address]  Vitamin B12     Status: None   Collection Time: 09/01/21  6:58 AM  Result Value Ref Range   Vitamin B-12 266 180 - 914 pg/mL    Comment: (NOTE) This assay is not validated for testing neonatal or myeloproliferative syndrome specimens for Vitamin B12 levels. Performed at [facility], [address] [place], [place] [address]    Blood Alcohol level:  Lab Results  Component Value Date   [facility] <10 08/30/2021   ETH <10 05/04/2017   Metabolic Disorder Labs: Lab Results  Component Value Date   HGBA1C 6.1 (H) 09/01/2021   MPG 128.37 09/01/2021   MPG 208.73 04/05/2020   No results found for: "PROLACTIN" Lab Results  Component  Value Date   CHOL 178 08/30/2021   TRIG 57 08/30/2021   HDL 48 08/30/2021   CHOLHDL 3.7 08/30/2021   VLDL 11 08/30/2021   LDLCALC 119 (H) 08/30/2021   LDLCALC 107 (H) 07/01/2021   Physical Findings: AIMS: 0 CIWA:   n/a COWS:  n/a   Musculoskeletal: Strength & Muscle Tone: within normal limits Gait & Station: normal Patient leans: N/A  Psychiatric Specialty Exam:  Presentation  General Appearance: Fairly Groomed  Eye Contact:Good  Speech:Clear and Coherent  Speech Volume:Normal  Handedness:Right   Mood and Affect  Mood:Euthymic  Affect:Congruent  Thought Process  Thought Processes:Coherent  Descriptions of Associations:Intact  Orientation:Full (Time, Place and Person)  Thought Content:Logical  History of Schizophrenia/Schizoaffective disorder:No data recorded Duration of Psychotic Symptoms:Less than six months  Hallucinations:Hallucinations: Auditory Description of Auditory Hallucinations: no voices today  Ideas of Reference:None  Suicidal Thoughts:Suicidal Thoughts: No  Homicidal Thoughts:Homicidal Thoughts: No   Sensorium  Memory:Immediate Good  Judgment:Fair  Insight:Fair  Executive Functions  Concentration:Fair  Attention Span:Fair  Recall:Fair  Fund of Knowledge:Fair  Language:Fair  Psychomotor Activity  Psychomotor Activity:Psychomotor Activity: Normal  Assets  Assets:Housing; Social Support  Sleep  Sleep:Sleep: Good Number of Hours of Sleep: 5  Physical Exam: Physical Exam Constitutional:      Appearance: She is obese.  HENT:     Head: Normocephalic.     Nose: Nose normal. No congestion or rhinorrhea.  Eyes:     Pupils: Pupils are equal, round, and reactive to light.  Pulmonary:     Effort: Pulmonary effort is normal.  Musculoskeletal:        General: Normal range of motion.     Cervical back: Normal range of motion.  Neurological:     Mental Status: She is alert  and oriented to person, place, and time.   Psychiatric:        Behavior: Behavior normal.        Thought Content: Thought content normal.    Review of Systems  Constitutional: Negative.  Negative for fever.  HENT: Negative.    Eyes: Negative.  Negative for blurred vision.  Respiratory: Negative.    Cardiovascular: Negative.   Gastrointestinal: Negative.   Genitourinary: Negative.   Musculoskeletal: Negative.   Skin: Negative.   Neurological: Negative.   Endo/Heme/Allergies: Negative.   Psychiatric/Behavioral:  Positive for depression and substance abuse. Negative for hallucinations, memory loss and suicidal ideas. The patient is nervous/anxious and has insomnia.    Blood pressure (!) 150/83, pulse 83, temperature 99.3 F (37.4 C), temperature source Oral, resp. rate 16, height 5\' 6"  (1.676 m), weight (!) 178.3 kg, SpO2 98 %. Body mass index is 63.43 kg/m. Treatment Plan Summary: Daily contact with patient to assess and evaluate symptoms and progress in treatment and Medication management   Observation Level/Precautions:  15 minute checks  Laboratory:  Labs reviewed   Psychotherapy:  Unit Group sessions  Med[facility]:  [facility]  Consultations:  To be determined   Discharge Concerns:  Safety, medication compliance, mood stability  Estimated LOS: 5-7 days  Other:  N/A    PLAN Safety and Monitoring: Voluntary admission to inpatient psychiatric unit for safety, stabilization and treatment Daily contact with patient to assess and evaluate symptoms and progress in treatment Patient's case to be discussed in multi-disciplinary team meeting Observation Level : q15 minute checks Vital signs: q12 hours Precautions: Safety   Long Term Goal(s): Improvement in symptoms so as ready for discharge   Short Term Goals: Ability to identify changes in lifestyle to reduce recurrence of condition will improve, Ability to verbalize feelings will improve, Ability to disclose and discuss suicidal ideas, Ability to identify and develop  effective coping behaviors will improve, Compliance with prescribed medications will improve, and Ability to identify triggers associated with substance abuse/mental health issues will improve   Physician Treatment Plan for Secondary Diagnosis:  Principal Problem:   MDD (major depressive disorder), recurrent, severe, with psychosis (HCC) Active Problems:   Severe obesity (BMI >= 40) (HCC)   HYPERTENSION, BENIGN SYSTEMIC   Obstructive sleep apnea   Asthma   Anxiety   Severe recurrent major depressive disorder with psychotic features (HCC) -Weaning off Lexapro-Give 10 mg x 3 days and stop -Continue Cymbalta 30 mg starting for MDD  (r/b/se/a to med reviewed and she consents to med trial) -Continue Abilify 5 mg nightly for psychosis and augmentation of depression (r/b/se/a to med reviewed and she consents to med trial) - Checking A1c, EKG with start of antipsychotic and Lipid panel reviewed.    Insomnia -Continue Melatonin 3 mg nightly   Anxiety -Continue Hydroxyzine 25 mg every 6 hours PRN   Hypertension -Increase Lisinopril to 20 mg (historical med)  Lower back pain -Start Lidoderm patch Q 24 hrs   Other PRNS -Continue Tylenol 650 mg every 6 hours PRN for mild pain -Continue Maalox 30 mg every 4 hrs PRN for indigestion -Continue Zofran disintegrating tabs every 6 hrs PRN for nausea  -Start Albuterol inhaler Q 4 H PRN for wheezing/SOB   Labs Reviewed: Hemoglobin A1C is 6.1. Pt has been educated on the need for healthy food choices and exercise and verbalizes understanding. EKG with QTC of 416.  Discharge Planning: Social work and case management to assist with discharge planning and identification of hospital follow-up  needs prior to discharge Estimated LOS: 5-7 days Discharge Concerns: Need to establish a safety plan; Medication compliance and effectiveness Discharge Goals: Return home with outpatient referrals for mental health follow-up including medication  management/psychotherapy  Starleen Blue, NP 09/01/2021, 1:20 PM

## 2021-09-01 NOTE — Progress Notes (Signed)
Pt BP remains high, NP aware and will evaluate after checking Pt's chart.

## 2021-09-01 NOTE — Progress Notes (Signed)
The patient rated her day as a 8 or 9 out of 10. She states that she spoke with her step kids today which went well. She also spoke with different members of her family. The patient states that she feels that her father is beginning to understand her mental illness.

## 2021-09-01 NOTE — Progress Notes (Signed)
   09/01/21 2139  Psych Admission Type (Psych Patients Only)  Admission Status Voluntary  Psychosocial Assessment  Patient Complaints Depression  Eye Contact Fair  Facial Expression Flat  Affect Appropriate to circumstance  Speech Logical/coherent  Interaction Assertive  Motor Activity Slow  Appearance/Hygiene Unremarkable  Behavior Characteristics Appropriate to situation  Mood Pleasant  Thought Process  Coherency WDL  Content WDL  Delusions None reported or observed  Perception WDL  Hallucination None reported or observed  Judgment Impaired  Confusion None  Danger to Self  Current suicidal ideation? Denies  Self-Injurious Behavior No self-injurious ideation or behavior indicators observed or expressed   Agreement Not to Harm Self Yes  Description of Agreement verbal  Danger to Others  Danger to Others None reported or observed
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
## 2021-09-01 NOTE — Progress Notes (Signed)
Called by [facility] staff for concern of elevated BP. Patient currently on lisinopril 10mg  qday. Med rec shows that she is supposed to be on 40mg  of lisinopril but apparently is non-compliant. Recommend titration of lisinopril up to target dose for BP control. Thank you.   , DO

## 2021-09-02 ENCOUNTER — Encounter (HOSPITAL_COMMUNITY): Payer: Self-pay

## 2021-09-02 ENCOUNTER — Ambulatory Visit: Payer: Self-pay | Admitting: Family Medicine

## 2021-09-02 MED ORDER — CLONIDINE HCL 0.1 MG PO TABS
0.1000 mg | ORAL_TABLET | Freq: Three times a day (TID) | ORAL | Status: DC | PRN
  Administered 2021-09-02: 0.1 mg via ORAL
  Filled 2021-09-02 (×2): qty 1

## 2021-09-02 MED ORDER — CLONIDINE HCL 0.1 MG PO TABS
0.1000 mg | ORAL_TABLET | Freq: Once | ORAL | Status: AC
  Administered 2021-09-02: 0.1 mg via ORAL
  Filled 2021-09-02: qty 1

## 2021-09-02 MED ORDER — LISINOPRIL 10 MG PO TABS
30.0000 mg | ORAL_TABLET | Freq: Every day | ORAL | Status: DC
  Administered 2021-09-03: 30 mg via ORAL
  Filled 2021-09-02 (×2): qty 3

## 2021-09-02 MED ORDER — LISINOPRIL 10 MG PO TABS
10.0000 mg | ORAL_TABLET | Freq: Once | ORAL | Status: AC
  Administered 2021-09-02: 10 mg via ORAL
  Filled 2021-09-02: qty 1

## 2021-09-02 MED ORDER — VITAMIN D (ERGOCALCIFEROL) 1.25 MG (50000 UNIT) PO CAPS
50000.0000 [IU] | ORAL_CAPSULE | ORAL | Status: DC
  Administered 2021-09-02: 50000 [IU] via ORAL
  Filled 2021-09-02 (×2): qty 1

## 2021-09-02 NOTE — BHH Group Notes (Signed)
Spiritual care group on grief and loss facilitated by chaplain Dyanne Carrel, [facility]   Group Goal:   Support / Education around grief and loss   Members engage in facilitated group support and psycho-social education.   Group Description:   Following introductions and group rules, group members engaged in facilitated group dialog and support around topic of loss, with particular support around experiences of loss in their lives. Group Identified types of loss (relationships / self / things) and identified patterns, circumstances, and changes that precipitate losses. Reflected on thoughts / feelings around loss, normalized grief responses, and recognized variety in grief experience. Group noted Worden's four tasks of grief in discussion.   Group drew on Adlerian / Rogerian, narrative, MI,   Patient Progress: Laura Benson participated in group and actively engaged in the group conversation.  She shared about the loss of her grandmother who died last year.  She is using her coping strategies and shared some with the group.  She was supportive of her peers.  [address], Bcc Pager, [phone]

## 2021-09-02 NOTE — Group Note (Signed)
Date:  09/02/2021 Time:  9:55 AM      Participation Level:  Active  Participation Quality:  Appropriate  Affect:  Appropriate  Cognitive:  Appropriate  Insight: Appropriate  Engagement in Group:  Engaged  Modes of Intervention:  Discussion  Additional Comments:  Patient stated her goal for today was to make calls to "get my car back".  Andrey Spearman 09/02/2021, 9:55 AM

## 2021-09-02 NOTE — Group Note (Signed)
Occupational Therapy Group Note   Group Topic:Goal Setting  Group Date: 09/02/2021 Start Time: 1400 End Time: 1445 Facilitators: Karine Garn G, OT   Group Description: Group encouraged engagement and participation through discussion focused on goal setting. Group members were introduced to goal-setting using the SMART Goal framework, identifying goals as Specific, Measureable, Acheivable, Relevant, and Time-Bound. Group members took time from group to create their own personal goal reflecting the SMART goal template and shared for review by peers and OT.    Therapeutic Goal(s):  Identify at least one goal that fits the SMART framework    Participation Level: Active   Participation Quality: Independent   Behavior: Appropriate   Speech/Thought Process: Directed   Affect/Mood: Appropriate   Insight: Fair   Judgement: Fair   Individualization: pt was active in their participation of group discussion/activity. New skills identified  Modes of Intervention: Discussion and Education  Patient Response to Interventions:  Attentive   Plan: Continue to engage patient in OT groups 2 - 3x/week.  09/02/2021  Akayla Brass G Latasia Silberstein, OT Branda Chaudhary, OT  

## 2021-09-02 NOTE — Progress Notes (Signed)
D:  Patient's self inventory sheet, patient has fair sleep, no sleep medicine.  Fair appetite, normal energy level, good concentration.  Rated depression and hopeless 3, anxiety 5.  Denied SI.  Physical problems, back, lower.  Physical pain, worst pain #8, lower back.  Pain medicine helpful.  Goal is coming out my room more and going to group.  Plans to come out my room more.  Discharge date?   Does have discharge plans. A:  Medications administered per MD orders.  Emotional support and encouragement given patient. R:  Denied SI and HI, contracts for safety.  Denied A/V hallucinations.  Safety maintained with 15 minute checks.

## 2021-09-02 NOTE — Progress Notes (Signed)
   09/02/21 2015  Psych Admission Type (Psych Patients Only)  Admission Status Voluntary  Psychosocial Assessment  Patient Complaints Anxiety;Depression  Eye Contact Fair  Facial Expression Flat  Affect Appropriate to circumstance  Speech Logical/coherent  Interaction Assertive  Motor Activity Slow  Appearance/Hygiene Unremarkable  Behavior Characteristics Cooperative  Mood Anxious;Depressed  Aggressive Behavior  Effect No apparent injury  Thought Process  Coherency WDL  Content WDL  Delusions WDL  Perception WDL  Hallucination None reported or observed  Judgment Impaired  Confusion WDL  Danger to Self  Current suicidal ideation? Denies  Danger to Others  Danger to Others None reported or observed

## 2021-09-02 NOTE — BH IP Treatment Plan (Signed)
Interdisciplinary Treatment and Diagnostic Plan Update  09/02/2021 Time of Session: 10:30am Laura Benson MRN: [id number]  Principal Diagnosis: MDD (major depressive disorder), recurrent, severe, with psychosis (HCC)  Secondary Diagnoses: Principal Problem:   MDD (major depressive disorder), recurrent, severe, with psychosis (HCC) Active Problems:   Severe obesity (BMI >= 40) (HCC)   HYPERTENSION, BENIGN SYSTEMIC   Obstructive sleep apnea   Asthma   Anxiety   Current Medications:  Current Facility-Administered Medications  Medication Dose Route Frequency Provider Last Rate Last Admin   acetaminophen (TYLENOL) tablet 650 mg  650 mg Oral Q6H PRN Bobbitt, Shalon E, NP   650 mg at 09/01/21 0825   albuterol (VENTOLIN HFA) 108 (90 Base) MCG/ACT inhaler 2 puff  2 puff Inhalation Q4H PRN Nkwenti, Doris, NP       alum & mag hydroxide-simeth (MAALOX/MYLANTA) 200-200-20 MG/5ML suspension 30 mL  30 mL Oral Q4H PRN Bobbitt, Shalon E, NP       ARIPiprazole (ABILIFY) tablet 5 mg  5 mg Oral QHS Nkwenti, Doris, NP   5 mg at 09/01/21 2116   cloNIDine (CATAPRES) tablet 0.1 mg  0.1 mg Oral Q8H PRN Comer Locket, MD   0.1 mg at 09/02/21 0904   DULoxetine (CYMBALTA) DR capsule 30 mg  30 mg Oral Daily Starleen Blue, NP   30 mg at 09/02/21 0823   escitalopram (LEXAPRO) tablet 10 mg  10 mg Oral Daily Starleen Blue, NP   10 mg at 09/02/21 6644   hydrOXYzine (ATARAX) tablet 25 mg  25 mg Oral TID PRN Bobbitt, Shalon E, NP   25 mg at 09/02/21 0824   lidocaine (LIDODERM) 5 % 1 patch  1 patch Transdermal Daily Attiah, Nadir, MD   1 patch at 09/02/21 0822   [START ON 09/03/2021] lisinopril (ZESTRIL) tablet 30 mg  30 mg Oral Daily Nkwenti, Doris, NP       magnesium hydroxide (MILK OF MAGNESIA) suspension 30 mL  30 mL Oral Daily PRN Bobbitt, Shalon E, NP       melatonin tablet 3 mg  3 mg Oral QHS Nkwenti, Doris, NP   3 mg at 09/01/21 2116   norethindrone-ethinyl estradiol-FE (LOESTRIN FE) 1-20 MG-MCG per tablet 1  tablet  1 tablet Oral Daily Abbott Pao, Nadir, MD   1 tablet at 09/02/21 0822   ondansetron (ZOFRAN-ODT) disintegrating tablet 4 mg  4 mg Oral Q6H PRN Starleen Blue, NP   4 mg at 09/01/21 1225   Vitamin D (Ergocalciferol) (DRISDOL) 1.25 MG (50000 UNIT) capsule 50,000 Units  50,000 Units Oral Q7 days Comer Locket, MD   50,000 Units at 09/02/21 0945   PTA Medications: Medications Prior to Admission  Medication Sig Dispense Refill Last Dose   albuterol (VENTOLIN HFA) 108 (90 Base) MCG/ACT inhaler Inhale 2 puffs into the lungs every 6 (six) hours as needed for wheezing or shortness of breath.      escitalopram (LEXAPRO) 20 MG tablet TAKE 1 TABLET BY MOUTH EVERY DAY 90 tablet 1    lisinopril (ZESTRIL) 40 MG tablet Take 1 tablet by mouth daily. (Patient not taking: Reported on 08/31/2021)   Not Taking   norethindrone-ethinyl estradiol-FE (LOESTRIN FE) 1-20 MG-MCG tablet Take 1 tablet by mouth daily. 28 tablet 11     Patient Stressors: Financial difficulties   Health problems   Loss of grandmother   Other: mental health    Patient Strengths: Average or above average intelligence  Capable of independent living  Communication skills  Motivation for treatment/growth  Supportive family/friends  Work skills   Treatment Modalities: Medication Management, Group therapy, Case management,  1 to 1 session with clinician, Psychoeducation, Recreational therapy.   Physician Treatment Plan for Primary Diagnosis: MDD (major depressive disorder), recurrent, severe, with psychosis (HCC) Long Term Goal(s): Improvement in symptoms so as ready for discharge   Short Term Goals: Ability to identify changes in lifestyle to reduce recurrence of condition will improve Ability to verbalize feelings will improve Ability to disclose and discuss suicidal ideas Ability to identify and develop effective coping behaviors will improve Compliance with prescribed medications will improve Ability to identify triggers  associated with substance abuse/mental health issues will improve  Medication Management: Evaluate patient's response, side effects, and tolerance of medication regimen.  Therapeutic Interventions: 1 to 1 sessions, Unit Group sessions and Medication administration.  Evaluation of Outcomes: Progressing  Physician Treatment Plan for Secondary Diagnosis: Principal Problem:   MDD (major depressive disorder), recurrent, severe, with psychosis (HCC) Active Problems:   Severe obesity (BMI >= 40) (HCC)   HYPERTENSION, BENIGN SYSTEMIC   Obstructive sleep apnea   Asthma   Anxiety  Long Term Goal(s): Improvement in symptoms so as ready for discharge   Short Term Goals: Ability to identify changes in lifestyle to reduce recurrence of condition will improve Ability to verbalize feelings will improve Ability to disclose and discuss suicidal ideas Ability to identify and develop effective coping behaviors will improve Compliance with prescribed medications will improve Ability to identify triggers associated with substance abuse/mental health issues will improve     Medication Management: Evaluate patient's response, side effects, and tolerance of medication regimen.  Therapeutic Interventions: 1 to 1 sessions, Unit Group sessions and Medication administration.  Evaluation of Outcomes: Progressing   RN Treatment Plan for Primary Diagnosis: MDD (major depressive disorder), recurrent, severe, with psychosis (HCC) Long Term Goal(s): Knowledge of disease and therapeutic regimen to maintain health will improve  Short Term Goals: Ability to remain free from injury will improve, Ability to verbalize frustration and anger appropriately will improve, Ability to demonstrate self-control, Ability to participate in decision making will improve, Ability to verbalize feelings will improve, Ability to disclose and discuss suicidal ideas, Ability to identify and develop effective coping behaviors will improve,  and Compliance with prescribed medications will improve  Medication Management: RN will administer medications as ordered by provider, will assess and evaluate patient's response and provide education to patient for prescribed medication. RN will report any adverse and/or side effects to prescribing provider.  Therapeutic Interventions: 1 on 1 counseling sessions, Psychoeducation, Medication administration, Evaluate responses to treatment, Monitor vital signs and CBGs as ordered, Perform/monitor CIWA, COWS, AIMS and Fall Risk screenings as ordered, Perform wound care treatments as ordered.  Evaluation of Outcomes: Progressing   LCSW Treatment Plan for Primary Diagnosis: MDD (major depressive disorder), recurrent, severe, with psychosis (HCC) Long Term Goal(s): Safe transition to appropriate next level of care at discharge, Engage patient in therapeutic group addressing interpersonal concerns.  Short Term Goals: Engage patient in aftercare planning with referrals and resources, Increase social support, Increase ability to appropriately verbalize feelings, Increase emotional regulation, Facilitate acceptance of mental health diagnosis and concerns, Facilitate patient progression through stages of change regarding substance use diagnoses and concerns, Identify triggers associated with mental health/substance abuse issues, and Increase skills for wellness and recovery  Therapeutic Interventions: Assess for all discharge needs, 1 to 1 time with Social worker, Explore available resources and support systems, Assess for adequacy in community support network, Educate family and significant  other(s) on suicide prevention, Complete Psychosocial Assessment, Interpersonal group therapy.  Evaluation of Outcomes: Progressing   Progress in Treatment: Attending groups: Yes. Participating in groups: Yes. Taking medication as prescribed: Yes. Toleration medication: Yes. Family/Significant other contact made:  Yes, individual(s) contacted:  Bartholomew Crews [phone] Patient understands diagnosis: Yes. Discussing patient identified problems/goals with staff: Yes. Medical problems stabilized or resolved: Yes. Denies suicidal/homicidal ideation: Yes. Issues/concerns per patient self-inventory: No.  New problem(s) identified: No, Describe:  none reported   New Short Term/Long Term Goal(s):  medication stabilization, elimination of SI thoughts, development of comprehensive mental wellness plan.    Patient Goals:  Patient states that she would like to participate in more groups and open up more  Discharge Plan or Barriers: Patient recently admitted. CSW will continue to follow and assess for appropriate referrals and possible discharge planning.    Reason for Continuation of Hospitalization: Anxiety Depression Medication stabilization Suicidal ideation  Estimated Length of Stay: 3-7 days  Last 3 [place] Suicide Severity Risk Score: Flowsheet Row Ad[facility] (Current) [facility] from [facility]/30/2021 in [facility] ED from [facility]/29/2021 [facility] [place] [facility]  C-SSRS RISK CATEGORY High Risk High Risk No Risk       Last PHQ 2/9 Scores:    07/01/2021    1:06 PM 06/27/2020   10:27 AM 04/13/2019    8:23 AM  Depression screen PHQ 2/9  Decreased Interest 3 3 1   Down, Depressed, Hopeless 3 3 1   PHQ - 2 Score 6 6 2   Altered sleeping 3 3   Tired, decreased energy 2 3   Change in appetite 0 3   Feeling bad or failure about yourself  1 1   Trouble concentrating 2 3   Moving slowly or fidgety/restless 0 1   Suicidal thoughts 0 0   PHQ-9 Score 14 20   Difficult doing work/chores Somewhat difficult Very difficult     Scribe for Treatment Team: , LCSW 09/02/2021 2:43 PM

## 2021-09-02 NOTE — Progress Notes (Signed)
Adult Psychoeducational Group Note  Date:  09/02/2021 Time:  8:18 PM  Group Topic/Focus:  Wrap-Up Group:   The focus of this group is to help patients review their daily goal of treatment and discuss progress on daily workbooks.  Participation Level:  Did Not Attend  Participation Quality:   Did Not Attend  Affect:   Did Not Attend  Cognitive:   Did Not Attend  Insight: None  Engagement in Group:   Did Not Attend  Modes of Intervention:   Did Not Attend  Additional Comments:   Pt was encouraged to attend AA group but did not attend. Felipa Furnace 09/02/2021, 8:18 PM

## 2021-09-02 NOTE — Group Note (Signed)
LCSW Group Therapy Note   Group Date: 09/02/2021 Start Time: 1300 End Time: 1400  Type of Therapy and Topic:  Group Therapy - Who Am I?  Participation Level:  Active   Description of Group The focus of this group was to aid patients in self-exploration and awareness. Patients were guided in exploring various factors of oneself to include interests, readiness to change, management of emotions, and individual perception of self. Patients were provided with complementary worksheets exploring hidden talents, ease of asking other for help, music/media preferences, understanding and responding to feelings/emotions, and hope for the future. At group closing, patients were encouraged to adhere to discharge plan to assist in continued self-exploration and understanding.  Therapeutic Goals Patients learned that self-exploration and awareness is an ongoing process Patients identified their individual skills, preferences, and abilities Patients explored their openness to establish and confide in supports Patients explored their readiness for change and progression of mental health   Summary of Patient Progress:  Patient actively engaged in introductory check-in. Patient actively engaged in activity of self-exploration and identification, completing complementary worksheet to assist in discussion. Patient identified various factors ranging from hidden talents, favorite music and movies, trusted individuals, accountability, and individual perceptions of self and hope. Pt engaged in processing thoughts and feelings as well as means of reframing thoughts. Pt proved receptive of alternate group members input and feedback from CSW.  Therapeutic Modalities Cognitive Behavioral Therapy Motivational Interviewing  Rogene [place], [place] 09/02/2021  6:06 PM

## 2021-09-02 NOTE — Plan of Care (Signed)
Nurse discussed coping skills, anxiety and depression with  patient.  

## 2021-09-02 NOTE — Progress Notes (Signed)
[facility] MD Progress Note  09/02/2021 12:23 PM Laura Benson  MRN:  [id number]  HPI:  Laura Benson is a 31 year old female with past diagnoses of MDD, anxiety, hypertension and gastric sleeve surgery who presented to the [facility] health urgent care [facility]) accompanied by law enforcement, after her sister called the police due to concerning messages that pt sent to sister and other family members. Patient was admitted to the [facility] continuous assessment unit. She was recommended for inpatient treatment, and subsequently transferred and admitted voluntarily to this [facility] for treatment and stabilization of her mood.  24 Hour chart review: As per flow sheets, pt's BP has remained consistently elevated over the past 24 hrs, with SBP between 150s-160s. She slept a total of 7 hours last night. Pt has attended some groups over the past 24 hrs. She is continuing to be appropriate and cooperative with care, and has taken all meds as scheduled over the past 24 hrs. She required Hydroxyzine 25 mg for anxiety earlier today morning, and required Clonidine 0.1 mg earlier today morning for an elevated BP. She has not required any agitation protocol medications for the past 24 hrs.  Today's patient assessment note: Pt seen in her room on the 300 hall for this assessment. She is lying in bed and presents with a euthymic mood & affect is congruent.  Attention to personal hygiene and grooming is fair, eye contact is good, speech is clear & coherent. Thought contents are organized and logical, and pt continues to deny SI/HI/AVH or paranoia. There is no evidence of delusional thoughts.  She reports that her lower back pain is improving with the Lidoderm patch.  She reports still being depressed, but states that her depressive symptoms have improved significantly since being admitted here at [facility]. She reports that she was able to talk to a few of her family members yesterday who were supportive. Pt has been  educated that CSW will coordinate to get outpatient f/u resources prior to discharge.  Pt BP has been consistently elevated as noted above. A one time dose of Clonidine 0.1 mg was given earlier today morning, and pt's Lisinopril increased to 30 mg/day and we are slowly titrating this med upwards for a goal of 40 mg daily (PTA med & dose, but states has not taken it in a month). Morning BP was 160/100 & 151/87 after Clonidine and additional 10 mg of Lisinopril. Pt is asymptomatic and we will continue to monitor this. She denies feeling of anxiety currently.  Pt reports a good sleep quality last night, and reports a fair appetite. She reports that her nausea has resolved. We will continue medications as listed below. She denies any medication related side effects.  Principal Problem: MDD (major depressive disorder), recurrent, severe, with psychosis (HCC) Diagnosis: Principal Problem:   MDD (major depressive disorder), recurrent, severe, with psychosis (HCC) Active Problems:   Severe obesity (BMI >= 40) (HCC)   HYPERTENSION, BENIGN SYSTEMIC   Obstructive sleep apnea   Asthma   Anxiety  Total Time spent with patient: 30 minutes  Past Psychiatric History: MDD, GAD  Past Medical History:  Past Medical History:  Diagnosis Date   Anxiety    Asthma    Complication of anesthesia    Take for a while to wake up   Depression    Diabetes mellitus without complication (HCC)    Hypertension    Morbid obesity (HCC)    Sleep apnea    CPAP   Tachycardia  Past Surgical History:  Procedure Laterality Date   ADENOIDECTOMY     LAPAROSCOPIC GASTRIC SLEEVE RESECTION N/A 04/16/2020   Procedure: LAPAROSCOPIC GASTRIC SLEEVE RESECTION;  Surgeon: Gaynelle AduWilson, Eric, MD;  Location: [facility];  Service: General;  Laterality: N/A;   TONSILLECTOMY     UPPER GI ENDOSCOPY N/A 04/16/2020   Procedure: UPPER GI ENDOSCOPY;  Surgeon: Gaynelle AduWilson, Eric, MD;  Location: [facility];  Service: General;  Laterality: N/A;   Family  History:  Family History  Problem Relation Age of Onset   Hypertension Mother    Diabetes Mother    Colon cancer Father    Hypertension Maternal Grandmother    Hypertension Paternal Grandmother    Diabetes Paternal Grandmother    Breast cancer Cousin    Family Psychiatric  History: none reported Social History:  Social History   Substance and Sexual Activity  Alcohol Use No     Social History   Substance and Sexual Activity  Drug Use No    Social History   Socioeconomic History   Marital status: Single    Spouse name: Not on file   Number of children: 0   Years of education: Not on file   Highest education level: Not on file  Occupational History    Employer: [employer]  Tobacco Use   Smoking status: Former    Years: 2.00    Types: Cigarettes   Smokeless tobacco: Never  Vaping Use   Vaping Use: Never used  Substance and Sexual Activity   Alcohol use: No   Drug use: No   Sexual activity: Yes    Birth control/protection: None  Other Topics Concern   Not on file  Social History Narrative   Not on file   Social Determinants of Health   Financial Resource Strain: Not on file  Food Insecurity: Not on file  Transportation Needs: Not on file  Physical Activity: Not on file  Stress: Not on file  Social Connections: Not on file   Additional Social History:     Sleep: Good  Appetite:  Good  Current Medications: Current Facility-Administered Medications  Medication Dose Route Frequency Provider Last Rate Last Admin   acetaminophen (TYLENOL) tablet 650 mg  650 mg Oral Q6H PRN Bobbitt, Shalon E, NP   650 mg at 09/01/21 0825   albuterol (VENTOLIN HFA) 108 (90 Base) MCG/ACT inhaler 2 puff  2 puff Inhalation Q4H PRN Cuma Polyakov, NP       alum & mag hydroxide-simeth (MAALOX/MYLANTA) 200-200-20 MG/5ML suspension 30 mL  30 mL Oral Q4H PRN Bobbitt, Shalon E, NP       ARIPiprazole (ABILIFY) tablet 5 mg  5 mg Oral QHS Labrian Torregrossa, NP   5 mg at 09/01/21 2116   cloNIDine  (CATAPRES) tablet 0.1 mg  0.1 mg Oral Q8H PRN Bartholomew CrewsSingleton, Amy E, MD   0.1 mg at 09/02/21 0904   DULoxetine (CYMBALTA) DR capsule 30 mg  30 mg Oral Daily Lumi Winslett, NP   30 mg at 09/02/21 0823   escitalopram (LEXAPRO) tablet 10 mg  10 mg Oral Daily Dacie Mandel, Tyler Aasoris, NP   10 mg at 09/02/21 0823   hydrOXYzine (ATARAX) tablet 25 mg  25 mg Oral TID PRN Bobbitt, Shalon E, NP   25 mg at 09/02/21 0824   lidocaine (LIDODERM) 5 % 1 patch  1 patch Transdermal Daily Attiah, Nadir, MD   1 patch at 09/02/21 0822   [START ON 09/03/2021] lisinopril (ZESTRIL) tablet 30 mg  30 mg Oral Daily Jaeden Messer,  Wilbon Obenchain, NP       magnesium hydroxide (MILK OF MAGNESIA) suspension 30 mL  30 mL Oral Daily PRN Bobbitt, Shalon E, NP       melatonin tablet 3 mg  3 mg Oral QHS Tacy Chavis, NP   3 mg at 09/01/21 2116   norethindrone-ethinyl estradiol-FE (LOESTRIN FE) 1-20 MG-MCG per tablet 1 tablet  1 tablet Oral Daily Abbott Pao, Nadir, MD   1 tablet at 09/02/21 0822   ondansetron (ZOFRAN-ODT) disintegrating tablet 4 mg  4 mg Oral Q6H PRN Starleen Blue, NP   4 mg at 09/01/21 1225   Vitamin D (Ergocalciferol) (DRISDOL) 1.25 MG (50000 UNIT) capsule 50,000 Units  50,000 Units Oral Q7 days Comer Locket, MD   50,000 Units at 09/02/21 0945   Lab Results:  Results for orders placed or performed during the hospital encounter of 08/31/21 (from the past 48 hour(s))  Hemoglobin A1c     Status: Abnormal   Collection Time: 09/01/21  6:58 AM  Result Value Ref Range   Hgb A1c MFr Bld 6.1 (H) 4.8 - 5.6 %    Comment: (NOTE) Pre diabetes:          5.7%-6.4%  Diabetes:              >6.4%  Glycemic control for   <7.0% adults with diabetes    Mean Plasma Glucose 128.37 mg/dL    Comment: Performed at [facility], [address], [place], [place] [address]  Vitamin B12     Status: None   Collection Time: 09/01/21  6:58 AM  Result Value Ref Range   Vitamin B-12 266 180 - 914 pg/mL    Comment: (NOTE) This assay is not validated for  testing neonatal or myeloproliferative syndrome specimens for Vitamin B12 levels. Performed at [facility], [address], [place], [place] [address]   VITAMIN D 25 Hydroxy (Vit-D Deficiency, Fractures)     Status: Abnormal   Collection Time: 09/01/21  6:58 AM  Result Value Ref Range   Vit D, 25-Hydroxy 26.60 (L) 30 - 100 ng/mL    Comment: (NOTE) Vitamin D deficiency has been defined by the Institute of Medicine  and an Endocrine Society practice guideline as a level of serum 25-OH  vitamin D less than 20 ng/mL (1,2). The Endocrine Society went on to  further define vitamin D insufficiency as a level between 21 and 29  ng/mL (2).  1. IOM (Institute of Medicine). 2010. Dietary reference intakes for  calcium and D. [place]: [employer]. 2. Holick MF, Binkley , Bischoff-Ferrari HA, et al. Evaluation,  treatment, and prevention of vitamin D deficiency: an Endocrine  Society clinical practice guideline, JCEM. 2011 Jul; 96(7): 1911-30.  Performed at [facility], [address] [place], [place] [address]   SARS Coronavirus 2 by RT PCR (hospital order, performed in [facility] hospital lab) *cepheid single result test* Anterior Nasal Swab     Status: None   Collection Time: 09/01/21 10:33 AM   Specimen: Anterior Nasal Swab  Result Value Ref Range   SARS Coronavirus 2 by RT PCR NEGATIVE NEGATIVE    Comment: (NOTE) SARS-CoV-2 target nucleic acids are NOT DETECTED.  The SARS-CoV-2 RNA is generally detectable in upper and lower respiratory specimens during the acute phase of infection. The lowest concentration of SARS-CoV-2 viral copies this assay can detect is 250 copies / mL. A negative result does not preclude SARS-CoV-2 infection and should not  be used as the sole basis for treatment or other patient management decisions.  A negative result may occur with improper specimen collection / handling, submission of specimen other than  nasopharyngeal swab, presence of viral mutation(s) within the areas targeted by this assay, and inadequate number of viral copies (<250 copies / mL). A negative result must be combined with clinical observations, patient history, and epidemiological information.  Fact Sheet for Patients:   RoadLapTop.co.za  Fact Sheet for Healthcare Providers: http://kim-miller.com/  This test is not yet approved or  cleared by the [place] FDA and has been authorized for detection and/or diagnosis of SARS-CoV-2 by FDA under an Emergency Use Authorization (EUA).  This EUA will remain in effect (meaning this test can be used) for the duration of the COVID-19 declaration under Section 564(b)(1) of the Act, 21 U.S.C. section 360bbb-3(b)(1), unless the authorization is terminated or revoked sooner.  Performed at [facility], [address] [place], [place] [address]    Blood Alcohol level:  Lab Results  Component Value Date   [facility] <10 08/30/2021   ETH <10 05/04/2017   Metabolic Disorder Labs: Lab Results  Component Value Date   HGBA1C 6.1 (H) 09/01/2021   MPG 128.37 09/01/2021   MPG 208.73 04/05/2020   No results found for: "PROLACTIN" Lab Results  Component Value Date   CHOL 178 08/30/2021   TRIG 57 08/30/2021   HDL 48 08/30/2021   CHOLHDL 3.7 08/30/2021   VLDL 11 08/30/2021   LDLCALC 119 (H) 08/30/2021   LDLCALC 107 (H) 07/01/2021   Physical Findings: AIMS: 0 CIWA:   n/a COWS:  n/a   Musculoskeletal: Strength & Muscle Tone: within normal limits Gait & Station: normal Patient leans: N/A  Psychiatric Specialty Exam:  Presentation  General Appearance: Appropriate for Environment; Fairly Groomed  Eye Contact:Good  Speech:Clear and Coherent  Speech Volume:Normal  Handedness:Right  Mood and Affect  Mood:Euthymic  Affect:Congruent  Thought Process  Thought Processes:Coherent  Descriptions of  Associations:Intact  Orientation:Full (Time, Place and Person)  Thought Content:Logical  History of Schizophrenia/Schizoaffective disorder:No data recorded Duration of Psychotic Symptoms:N/A  Hallucinations:Hallucinations: None Description of Auditory Hallucinations: no voices today  Ideas of Reference:None  Suicidal Thoughts:Suicidal Thoughts: No  Homicidal Thoughts:Homicidal Thoughts: No  Sensorium  Memory:Immediate Good  Judgment:Fair  Insight:Fair  Executive Functions  Concentration:Fair  Attention Span:Good  Recall:Good  Fund of Knowledge:Good  Language:Good  Psychomotor Activity  Psychomotor Activity:Psychomotor Activity: Normal   Assets  Assets:Communication Skills  Sleep  Sleep:Sleep: Good  Physical Exam: Physical Exam Constitutional:      Appearance: She is obese.  HENT:     Head: Normocephalic.     Nose: Nose normal. No congestion or rhinorrhea.  Eyes:     Pupils: Pupils are equal, round, and reactive to light.  Pulmonary:     Effort: Pulmonary effort is normal.  Musculoskeletal:        General: Normal range of motion.     Cervical back: Normal range of motion.  Neurological:     Mental Status: She is alert and oriented to person, place, and time.  Psychiatric:        Behavior: Behavior normal.        Thought Content: Thought content normal.    Review of Systems  Constitutional: Negative.  Negative for fever.  HENT: Negative.    Eyes: Negative.  Negative for blurred vision.  Respiratory: Negative.    Cardiovascular: Negative.   Gastrointestinal: Negative.   Genitourinary: Negative.  Musculoskeletal: Negative.   Skin: Negative.   Neurological: Negative.   Endo/Heme/Allergies: Negative.   Psychiatric/Behavioral:  Positive for depression and substance abuse. Negative for hallucinations, memory loss and suicidal ideas. The patient is nervous/anxious (improving) and has insomnia (improving).    Blood pressure (!) 151/87, pulse 63,  temperature 98.8 F (37.1 C), temperature source Oral, resp. rate 16, height 5\' 6"  (1.676 m), weight (!) 178.3 kg, SpO2 100 %. Body mass index is 63.43 kg/m. Treatment Plan Summary: Daily contact with patient to assess and evaluate symptoms and progress in treatment and Medication management   Observation Level/Precautions:  15 minute checks  Laboratory:  Labs reviewed   Psychotherapy:  Unit Group sessions  Medications:  See [facility]  Consultations:  To be determined   Discharge Concerns:  Safety, medication compliance, mood stability  Estimated LOS: 5-7 days  Other:  N/A    PLAN Safety and Monitoring: Voluntary admission to inpatient psychiatric unit for safety, stabilization and treatment Daily contact with patient to assess and evaluate symptoms and progress in treatment Patient's case to be discussed in multi-disciplinary team meeting Observation Level : q15 minute checks Vital signs: q12 hours Precautions: Safety   Long Term Goal(s): Improvement in symptoms so as ready for discharge   Short Term Goals: Ability to identify changes in lifestyle to reduce recurrence of condition will improve, Ability to verbalize feelings will improve, Ability to disclose and discuss suicidal ideas, Ability to identify and develop effective coping behaviors will improve, Compliance with prescribed medications will improve, and Ability to identify triggers associated with substance abuse/mental health issues will improve   Physician Treatment Plan for Secondary Diagnosis:  Principal Problem:   MDD (major depressive disorder), recurrent, severe, with psychosis (HCC) Active Problems:   Severe obesity (BMI >= 40) (HCC)   HYPERTENSION, BENIGN SYSTEMIC   Obstructive sleep apnea   Asthma   Anxiety   Severe recurrent major depressive disorder with psychotic features (HCC) -Weaning off Lexapro-Give 10 mg x 3 days and stop -Continue Cymbalta 30 mg starting for MDD  (r/b/se/a to med reviewed and she  consents to med trial) -Continue Abilify 5 mg nightly for psychosis and augmentation of depression (r/b/se/a to med reviewed and she consents to med trial) - A1c 6.1-educated on healthy diet and exercise & f/u with PCP, EKG with start of antipsychotic and Lipid panel reviewed.    Insomnia -Continue Melatonin 3 mg nightly   Anxiety -Continue Hydroxyzine 25 mg every 6 hours PRN   Hypertension -Increase Lisinopril to 30 mg (historical med) -Start Clonidine 0.1 mg TID PRN for SBP>160/DSB>110 (see MAR)  Lower back pain -Continue Lidoderm patch Q 24 hrs  Other PRNS -Continue Tylenol 650 mg every 6 hours PRN for mild pain -Continue Maalox 30 mg every 4 hrs PRN for indigestion -Continue Zofran disintegrating tabs every 6 hrs PRN for nausea  -Start Albuterol inhaler Q 4 H PRN for wheezing/SOB   Labs Reviewed: Hemoglobin A1C is 6.1. Pt has been educated on the need for healthy food choices and exercise and verbalizes understanding. EKG with QTC of 416.  Discharge Planning: Social work and case management to assist with discharge planning and identification of hospital follow-up needs prior to discharge Estimated LOS: 5-7 days Discharge Concerns: Need to establish a safety plan; Medication compliance and effectiveness Discharge Goals: Return home with outpatient referrals for mental health follow-up including medication management/psychotherapy  [facility], NP 09/02/2021, 12:23 PMPatient ID: 11/02/2021, female   DOB: 08-Jun-1990, 31 y.o.   MRN:  [id number]  

## 2021-09-03 LAB — VITAMIN B1: Vitamin B1 (Thiamine): 106.7 nmol/L (ref 66.5–200.0)

## 2021-09-03 MED ORDER — LISINOPRIL 10 MG PO TABS
10.0000 mg | ORAL_TABLET | Freq: Once | ORAL | Status: AC
  Administered 2021-09-03: 10 mg via ORAL
  Filled 2021-09-03: qty 1

## 2021-09-03 MED ORDER — DULOXETINE HCL 30 MG PO CPEP
30.0000 mg | ORAL_CAPSULE | Freq: Once | ORAL | Status: AC
  Administered 2021-09-03: 30 mg via ORAL
  Filled 2021-09-03: qty 1

## 2021-09-03 MED ORDER — AMLODIPINE BESYLATE 5 MG PO TABS
5.0000 mg | ORAL_TABLET | Freq: Every day | ORAL | Status: DC
  Administered 2021-09-03 – 2021-09-04 (×2): 5 mg via ORAL
  Filled 2021-09-03 (×4): qty 1
  Filled 2021-09-03: qty 7

## 2021-09-03 MED ORDER — CLONIDINE HCL 0.1 MG PO TABS
0.1000 mg | ORAL_TABLET | Freq: Three times a day (TID) | ORAL | Status: DC | PRN
  Administered 2021-09-03: 0.1 mg via ORAL
  Filled 2021-09-03: qty 1

## 2021-09-03 MED ORDER — DULOXETINE HCL 60 MG PO CPEP
60.0000 mg | ORAL_CAPSULE | Freq: Every day | ORAL | Status: DC
  Administered 2021-09-04: 60 mg via ORAL
  Filled 2021-09-03 (×2): qty 1
  Filled 2021-09-03 (×2): qty 7

## 2021-09-03 MED ORDER — LISINOPRIL 20 MG PO TABS
40.0000 mg | ORAL_TABLET | Freq: Every day | ORAL | Status: DC
  Administered 2021-09-04: 40 mg via ORAL
  Filled 2021-09-03: qty 1
  Filled 2021-09-03 (×2): qty 14
  Filled 2021-09-03: qty 1

## 2021-09-03 NOTE — Progress Notes (Addendum)
D:  Patient's self inventory sheet, patient has fair sleep, no sleep medication.  Fair appetite, normal energy level, good concentration.  Rated depression and anxiety 5, hopeless 3.  Denied withdrawals.  Denied SI.  Physical problems, pain, lower back, worst pain #8 in past 24 hours.  Goal is be around people more.  Plans to keep out of my room.  Does not want to have bad dreams at night.  No discharge plans. A:  Medications administered per MD orders.  Emotional support and encouragement given patient. R:  Safety maintained with 15 minute checks.  Denied SI and HI, contracts for safety.  Denied A/V hallucinations.  Stated she slept 6 hours off/on last night.

## 2021-09-03 NOTE — Plan of Care (Signed)
Nurse discussed anxiety, depression and coping skills with patient.  

## 2021-09-03 NOTE — Progress Notes (Signed)
   09/03/21 2000  Psych Admission Type (Psych Patients Only)  Admission Status Voluntary  Psychosocial Assessment  Patient Complaints Anxiety  Eye Contact Fair  Facial Expression Flat  Affect Appropriate to circumstance  Speech Logical/coherent  Interaction Assertive  Motor Activity Slow  Appearance/Hygiene Unremarkable  Behavior Characteristics Cooperative  Mood Anxious;Depressed  Aggressive Behavior  Effect No apparent injury  Thought Process  Coherency WDL  Content WDL  Delusions WDL  Perception WDL  Hallucination None reported or observed  Judgment Impaired  Confusion WDL  Danger to Self  Current suicidal ideation? Denies  Danger to Others  Danger to Others None reported or observed

## 2021-09-03 NOTE — Group Note (Signed)
Date:  09/03/2021 Time:  9:20 AM  Group Topic/Focus:  Orientation:   The focus of this group is to educate the patient on the purpose and policies of crisis stabilization and provide a format to answer questions about their admission.  The group details unit policies and expectations of patients while admitted.    Participation Level:  Active  Participation Quality:  Appropriate  Affect:  Appropriate  Cognitive:  Appropriate  Insight: Appropriate  Engagement in Group:  Engaged  Modes of Intervention:  Discussion  Additional Comments:    [name] 09/03/2021, 9:20 AM

## 2021-09-03 NOTE — Group Note (Signed)
Recreation Therapy Group Note   Group Topic:Animal Assisted Therapy   Group Date: 09/03/2021 Start Time: 1425 End Time: 1500 Facilitators: Nanette Wirsing, Benito Mccreedy, LRT Location: 300 Hall Dayroom  Animal-Assisted Activity (AAA) Program Checklist/Progress Notes Patient Eligibility Criteria Checklist & Daily Group note for Rec Tx Intervention   AAA/T Program Assumption of Risk Form signed by Patient/ or Parent Legal Guardian YES  Patient is free of allergies or severe asthma  YES  Patient reports no fear of animals YES  Patient reports no history of cruelty to animals YES  Patient understands their participation is voluntary YES  Patient washes hands before animal contact YES  Patient washes hands after animal contact YES  Group Description: Patients provided opportunity to interact with trained and credentialed [employer] Therapy dog and the community volunteer/dog handler. Patients practiced appropriate animal interaction and were educated on dog safety outside of the hospital in common community settings. Patients were encouraged to socialize with one another and share about their experiences with animals and pets. Patients participated with turn taking for dog interactions with structure imposed as needed based on number of participants and quality of spontaneous participation delivered.  Goal Area(s) Addresses:  Patient will demonstrate appropriate social skills during group session.  Patient will demonstrate ability to follow instructions during group session.  Patient will identify if a reduction in stress level occurs as a result of participation in animal assisted therapy session.    Education: Charity fundraiser, Health visitor, Communication & Social Skills   Affect/Mood: Congruent and Happy   Participation Level: Engaged   Participation Quality: Independent   Behavior: Attentive , Cooperative, and Interactive    Modes of Intervention: Activity, Research officer, trade union, and Socialization   Patient Response to Interventions:  Interested  and Receptive   Education Outcome:  Acknowledges education   Clinical Observations/Individualized Feedback: Ronne was active in their participation of session activities and group discussion. Pt appropriately pet the visiting therapy dog, Colin Mulders and asked relevant questions to community volunteer. Pt reflected that they have a Malte-Poo named Apollo at home. Pt briefly called out of dayroom for consult with CSW and returned.   Benito Mccreedy Aliah Eriksson, LRT, CTRS 09/03/2021 4:05 PM

## 2021-09-03 NOTE — Progress Notes (Signed)
   09/03/21 0515  Sleep  Number of Hours 6.5

## 2021-09-03 NOTE — [facility] Suicide Risk Assessment (Signed)
[facility] INPATIENT:  Family/Significant Other Suicide Prevention Education  Suicide Prevention Education:  Contact Attempts: Laura Benson, sister  has been identified by the patient as the family member/significant other with whom the patient will be residing, and identified as the person(s) who will aid the patient in the event of a mental health crisis.  With written consent from the patient, two attempts were made to provide suicide prevention education, prior to and/or following the patient's discharge.  We were unsuccessful in providing suicide prevention education.  A suicide education pamphlet was given to the patient to share with family/significant other.  Date and time of first attempt:09/03/2021 12:58 PM Date and time of second attempt: CSW will make additional attempt to reach collateral.   Laura Benson 09/03/2021, 12:56 PM

## 2021-09-03 NOTE — Progress Notes (Signed)
[facility] MD Progress Note  09/03/2021 12:53 PM Laura Benson  MRN:  [id number]  HPI:  Laura Benson is a 31 year old female with past diagnoses of MDD, anxiety, hypertension and gastric sleeve surgery who presented to the [facility] health urgent care [facility]) accompanied by law enforcement, after her sister called the police due to concerning messages that pt sent to sister and other family members. Patient was admitted to the [facility] continuous assessment unit. She was recommended for inpatient treatment, and subsequently transferred and admitted voluntarily to this [facility] for treatment and stabilization of her mood.  24 Hour chart review: As per flow sheets, pt's BP has remained consistently elevated over the past 24 hrs, with SBP continuing to fluctuate between 150s-160s. She slept a total of 6.5 hours last night as per nursing flow sheets. Pt has attended some groups over the past 24 hrs. She is continuing to be appropriate and cooperative with care, and has taken all meds as scheduled over the past 24 hrs. She required Hydroxyzine 25 mg for anxiety earlier today morning, and also had it last night. She has not required any agitation protocol medications for the past 24 hrs. She has been noted to be anxious, logical, anxious & depressed, but cooperative with care.  Today's patient assessment note: Pt seen in her room on the 300 hall for this assessment. She is again lying in bed and presents with a euthymic mood & affect is congruent. Pt reports that she attended the morning group session, became tired and decided to lie down, and that she will get back up to participated in other unit activities.  Attention to personal hygiene and grooming is fair, eye contact is good, speech is clear & coherent. Thought contents continue to be organized and logical, and pt continues to deny SI/HI/AVH or paranoia. There is no evidence of delusional thoughts.  She reports that her lower back pain is  improving with the Lidoderm patch.  She reports that her depressive symptoms have improved significantly since being admitted here at [facility]. She reports that she has a supportive family and prefers to be discharged tomorrow. Pt has been educated that CSW will coordinate to get outpatient f/u resources prior to discharging her tomorrow.   We have changed parameters for Clonidine to 0.1 mg with SBP>160/DBP>100. Lisinopril is also increased to 40 mg daily for management of hypertension. Pt has been educated on the need for f/u with her primary care physician after discharge and verbalizes understanding. She reports a good sleep quality last night and a good appetite. Medications adjusted as listed below.  Principal Problem: MDD (major depressive disorder), recurrent, severe, with psychosis (HCC) Diagnosis: Principal Problem:   MDD (major depressive disorder), recurrent, severe, with psychosis (HCC) Active Problems:   Severe obesity (BMI >= 40) (HCC)   HYPERTENSION, BENIGN SYSTEMIC   Obstructive sleep apnea   Asthma   Anxiety  Total Time spent with patient: 30 minutes  Past Psychiatric History: MDD, GAD  Past Medical History:  Past Medical History:  Diagnosis Date   Anxiety    Asthma    Complication of anesthesia    Take for a while to wake up   Depression    Diabetes mellitus without complication (HCC)    Hypertension    Morbid obesity (HCC)    Sleep apnea    CPAP   Tachycardia     Past Surgical History:  Procedure Laterality Date   ADENOIDECTOMY     LAPAROSCOPIC GASTRIC SLEEVE RESECTION N/A  04/16/2020   Procedure: LAPAROSCOPIC GASTRIC SLEEVE RESECTION;  Surgeon: [name], MD;  Location: [facility];  Service: General;  Laterality: N/A;   TONSILLECTOMY     UPPER GI ENDOSCOPY N/A 04/16/2020   Procedure: UPPER GI ENDOSCOPY;  Surgeon: [name], MD;  Location: [facility];  Service: General;  Laterality: N/A;   Family History:  Family History  Problem Relation Age of Onset    Hypertension Mother    Diabetes Mother    Colon cancer Father    Hypertension Maternal Grandmother    Hypertension Paternal Grandmother    Diabetes Paternal Grandmother    Breast cancer Cousin    Family Psychiatric  History: none reported Social History:  Social History   Substance and Sexual Activity  Alcohol Use No     Social History   Substance and Sexual Activity  Drug Use No    Social History   Socioeconomic History   Marital status: Single    Spouse name: Not on file   Number of children: 0   Years of education: Not on file   Highest education level: Not on file  Occupational History    Employer: [employer]  Tobacco Use   Smoking status: Former    Years: 2.00    Types: Cigarettes   Smokeless tobacco: Never  Vaping Use   Vaping Use: Never used  Substance and Sexual Activity   Alcohol use: No   Drug use: No   Sexual activity: Yes    Birth control/protection: None  Other Topics Concern   Not on file  Social History Narrative   Not on file   Social Determinants of Health   Financial Resource Strain: Not on file  Food Insecurity: Not on file  Transportation Needs: Not on file  Physical Activity: Not on file  Stress: Not on file  Social Connections: Not on file   Additional Social History:     Sleep: Good  Appetite:  Good  Current Medications: Current Facility-Administered Medications  Medication Dose Route Frequency Provider Last Rate Last Admin   acetaminophen (TYLENOL) tablet 650 mg  650 mg Oral Q6H PRN Bobbitt, Shalon E, NP   650 mg at 09/03/21 0759   albuterol (VENTOLIN HFA) 108 (90 Base) MCG/ACT inhaler 2 puff  2 puff Inhalation Q4H PRN Inas Avena, NP       alum & mag hydroxide-simeth (MAALOX/MYLANTA) 200-200-20 MG/5ML suspension 30 mL  30 mL Oral Q4H PRN Bobbitt, Shalon E, NP       ARIPiprazole (ABILIFY) tablet 5 mg  5 mg Oral QHS Teale Goodgame, NP   5 mg at 09/02/21 2123   cloNIDine (CATAPRES) tablet 0.1 mg  0.1 mg Oral Q8H PRN Starleen Blue, NP       [START ON 09/04/2021] DULoxetine (CYMBALTA) DR capsule 60 mg  60 mg Oral Daily Lakeena Downie, NP       hydrOXYzine (ATARAX) tablet 25 mg  25 mg Oral TID PRN Bobbitt, Shalon E, NP   25 mg at 09/03/21 0759   lidocaine (LIDODERM) 5 % 1 patch  1 patch Transdermal Daily Attiah, Nadir, MD   1 patch at 09/03/21 0756   [START ON 09/04/2021] lisinopril (ZESTRIL) tablet 40 mg  40 mg Oral Daily Parisha Beaulac, NP       magnesium hydroxide (MILK OF MAGNESIA) suspension 30 mL  30 mL Oral Daily PRN Bobbitt, Shalon E, NP       melatonin tablet 3 mg  3 mg Oral QHS Starleen Blue, NP  3 mg at 09/02/21 2123   norethindrone-ethinyl estradiol-FE (LOESTRIN FE) 1-20 MG-MCG per tablet 1 tablet  1 tablet Oral Daily Attiah, Nadir, MD   1 tablet at 09/03/21 0757   ondansetron (ZOFRAN-ODT) disintegrating tablet 4 mg  4 mg Oral Q6H PRN Starleen Blue, NP   4 mg at 09/01/21 1225   Vitamin D (Ergocalciferol) (DRISDOL) 1.25 MG (50000 UNIT) capsule 50,000 Units  50,000 Units Oral Q7 days Comer Locket, MD   50,000 Units at 09/02/21 0945   Lab Results:  No results found for this or any previous visit (from the past 48 hour(s)).  Blood Alcohol level:  Lab Results  Component Value Date   ETH <10 08/30/2021   ETH <10 05/04/2017   Metabolic Disorder Labs: Lab Results  Component Value Date   HGBA1C 6.1 (H) 09/01/2021   MPG 128.37 09/01/2021   MPG 208.73 04/05/2020   No results found for: "PROLACTIN" Lab Results  Component Value Date   CHOL 178 08/30/2021   TRIG 57 08/30/2021   HDL 48 08/30/2021   CHOLHDL 3.7 08/30/2021   VLDL 11 08/30/2021   LDLCALC 119 (H) 08/30/2021   LDLCALC 107 (H) 07/01/2021   Physical Findings: AIMS: 0 CIWA:   n/a COWS:  n/a   Musculoskeletal: Strength & Muscle Tone: within normal limits Gait & Station: normal Patient leans: N/A  Psychiatric Specialty Exam:  Presentation  General Appearance: Appropriate for Environment; Fairly Groomed  Eye  Contact:Good  Speech:Clear and Coherent  Speech Volume:Normal  Handedness:Right  Mood and Affect  Mood:Euthymic  Affect:Congruent  Thought Process  Thought Processes:Coherent  Descriptions of Associations:Intact  Orientation:Full (Time, Place and Person)  Thought Content:Logical  History of Schizophrenia/Schizoaffective disorder:No data recorded Duration of Psychotic Symptoms:N/A  Hallucinations:Hallucinations: None  Ideas of Reference:None  Suicidal Thoughts:Suicidal Thoughts: No  Homicidal Thoughts:Homicidal Thoughts: No  Sensorium  Memory:Immediate Good  Judgment:Good  Insight:Good  Executive Functions  Concentration:Good  Attention Span:Good  Recall:Good  Fund of Knowledge:Good  Language:Good  Psychomotor Activity  Psychomotor Activity:Psychomotor Activity: Normal   Assets  Assets:Housing; Social Support  Sleep  Sleep:Sleep: Good  Physical Exam: Physical Exam Constitutional:      Appearance: She is obese.  HENT:     Head: Normocephalic.     Nose: Nose normal. No congestion or rhinorrhea.  Eyes:     Pupils: Pupils are equal, round, and reactive to light.  Pulmonary:     Effort: Pulmonary effort is normal.  Musculoskeletal:        General: Normal range of motion.     Cervical back: Normal range of motion.  Neurological:     Mental Status: She is alert and oriented to person, place, and time.  Psychiatric:        Behavior: Behavior normal.        Thought Content: Thought content normal.    Review of Systems  Constitutional: Negative.  Negative for fever.  HENT: Negative.    Eyes: Negative.  Negative for blurred vision.  Respiratory: Negative.    Cardiovascular: Negative.   Gastrointestinal: Negative.   Genitourinary: Negative.   Musculoskeletal: Negative.   Skin: Negative.   Neurological: Negative.   Endo/Heme/Allergies: Negative.   Psychiatric/Behavioral:  Positive for depression and substance abuse. Negative for  hallucinations, memory loss and suicidal ideas. The patient is nervous/anxious (improving) and has insomnia (improving).    Blood pressure (!) 155/101, pulse 70, temperature 98.5 F (36.9 C), temperature source Oral, resp. rate 16, height 5\' 6"  (1.676 m), weight (!) 178.3  kg, SpO2 100 %. Body mass index is 63.43 kg/m. Treatment Plan Summary: Daily contact with patient to assess and evaluate symptoms and progress in treatment and Medication management   Observation Level/Precautions:  15 minute checks  Laboratory:  Labs reviewed   Psychotherapy:  Unit Group sessions  Medications:  See [facility]  Consultations:  To be determined   Discharge Concerns:  Safety, medication compliance, mood stability  Estimated LOS: 5-7 days  Other:  N/A    PLAN Safety and Monitoring: Voluntary admission to inpatient psychiatric unit for safety, stabilization and treatment Daily contact with patient to assess and evaluate symptoms and progress in treatment Patient's case to be discussed in multi-disciplinary team meeting Observation Level : q15 minute checks Vital signs: q12 hours Precautions: Safety   Long Term Goal(s): Improvement in symptoms so as ready for discharge   Short Term Goals: Ability to identify changes in lifestyle to reduce recurrence of condition will improve, Ability to verbalize feelings will improve, Ability to disclose and discuss suicidal ideas, Ability to identify and develop effective coping behaviors will improve, Compliance with prescribed medications will improve, and Ability to identify triggers associated with substance abuse/mental health issues will improve   Physician Treatment Plan for Secondary Diagnosis:  Principal Problem:   MDD (major depressive disorder), recurrent, severe, with psychosis (HCC) Active Problems:   Severe obesity (BMI >= 40) (HCC)   HYPERTENSION, BENIGN SYSTEMIC   Obstructive sleep apnea   Asthma   Anxiety   Severe recurrent major depressive disorder  with psychotic features (HCC) -Weaned off Lexapro-n 10 mg x 3 days and stopped -Increase Cymbalta to 60 mg starting for MDD  (r/b/se/a to med reviewed and she consents to med trial) -Continue Abilify 5 mg nightly for psychosis and augmentation of depression (r/b/se/a to med reviewed and she consents to med trial) - A1c 6.1-educated on healthy diet and exercise & f/u with PCP, EKG with start of antipsychotic and Lipid panel reviewed.    Insomnia -Continue Melatonin 3 mg nightly   Anxiety -Continue Hydroxyzine 25 mg every 6 hours PRN   Hypertension -Increase Lisinopril to 40 mg (historical med) -Continue Clonidine 0.1 mg TID PRN for SBP>160/DSB>100 (see MAR)  Lower back pain -Continue Lidoderm patch Q 24 hrs  Other PRNS -Continue Tylenol 650 mg every 6 hours PRN for mild pain -Continue Maalox 30 mg every 4 hrs PRN for indigestion -Continue Zofran disintegrating tabs every 6 hrs PRN for nausea  -Start Albuterol inhaler Q 4 H PRN for wheezing/SOB   Labs Reviewed: Hemoglobin A1C is 6.1. Pt has been educated on the need for healthy food choices and exercise and verbalizes understanding. EKG with QTC of 416.  Discharge Planning: Social work and case management to assist with discharge planning and identification of hospital follow-up needs prior to discharge Estimated LOS: 5-7 days Discharge Concerns: Need to establish a safety plan; Medication compliance and effectiveness Discharge Goals: Return home with outpatient referrals for mental health follow-up including medication management/psychotherapy  Starleen Blue, NP 09/03/2021, 12:53 PMPatient ID: Vivien Rossetti, female   DOB: 06-08-1990, 31 y.o.   MRN: [id number] Patient ID: Lalitha Ilyas, female   DOB: [birth year], 30 y.o.   MRN: [id number]

## 2021-09-03 NOTE — Progress Notes (Signed)
Adult Psychoeducational Group Note  Date:  09/03/2021 Time:  9:21 PM  Group Topic/Focus:  Wrap-Up Group:   The focus of this group is to help patients review their daily goal of treatment and discuss progress on daily workbooks.  Participation Level:  Active  Participation Quality:  Appropriate  Affect:  Appropriate  Cognitive:  Appropriate  Insight: Appropriate  Engagement in Group:  Developing/Improving  Modes of Intervention:  Discussion  Additional Comments:  Pt stated her goal for today was to focus on her treatment plan and interact more with peers and staff. Pt stated she accomplished her goals today. Pt stated she talked with her doctor and with her  social worker about her care today. Pt rated her overall day a 10. Pt stated she felt better about herself tonight. Pt stated she was able to attend all meals today. Pt stated she took all medications provided today. Pt stated her appetite was improved today. Pt rated her sleep last night was fair. Pt stated the goal tonight was to get some rest. Pt stated she had some physical pain tonight. Pt stated she had some moderate pain in her lower back. Pt rated the moderate pain in her lower back a 5 on the pain level scale. Pt nurse was updated on the situation . Pt deny visual hallucinations and auditory issues tonight. Pt denies thoughts of harming herself or others. Pt stated she would alert staff if anything changed.  Felipa Furnace 09/03/2021, 9:21 PM

## 2021-09-04 DIAGNOSIS — F333 Major depressive disorder, recurrent, severe with psychotic symptoms: Principal | ICD-10-CM

## 2021-09-04 MED ORDER — VITAMIN D (ERGOCALCIFEROL) 1.25 MG (50000 UNIT) PO CAPS
50000.0000 [IU] | ORAL_CAPSULE | ORAL | 0 refills | Status: DC
Start: 2021-09-09 — End: 2023-03-24

## 2021-09-04 MED ORDER — MELATONIN 3 MG PO TABS
3.0000 mg | ORAL_TABLET | Freq: Every day | ORAL | 0 refills | Status: DC
Start: 2021-09-04 — End: 2023-03-24

## 2021-09-04 MED ORDER — AMLODIPINE BESYLATE 10 MG PO TABS
10.0000 mg | ORAL_TABLET | Freq: Every day | ORAL | Status: DC
  Filled 2021-09-04: qty 7

## 2021-09-04 MED ORDER — AMLODIPINE BESYLATE 5 MG PO TABS
5.0000 mg | ORAL_TABLET | Freq: Once | ORAL | Status: AC
  Administered 2021-09-04: 5 mg via ORAL
  Filled 2021-09-04: qty 1

## 2021-09-04 MED ORDER — HYDROXYZINE HCL 25 MG PO TABS: 25.0000 mg | ORAL_TABLET | Freq: Three times a day (TID) | ORAL | 0 refills | Status: DC | PRN

## 2021-09-04 MED ORDER — WHITE PETROLATUM EX OINT
TOPICAL_OINTMENT | CUTANEOUS | Status: AC
  Filled 2021-09-04: qty 5

## 2021-09-04 MED ORDER — AMLODIPINE BESYLATE 10 MG PO TABS: 10.0000 mg | ORAL_TABLET | Freq: Every day | ORAL | 0 refills | Status: DC

## 2021-09-04 MED ORDER — LIDOCAINE 5 % EX PTCH
1.0000 | MEDICATED_PATCH | Freq: Every day | CUTANEOUS | 0 refills | Status: DC
Start: 2021-09-05 — End: 2023-03-24

## 2021-09-04 MED ORDER — DULOXETINE HCL 60 MG PO CPEP: 60.0000 mg | ORAL_CAPSULE | Freq: Every day | ORAL | 0 refills | Status: DC

## 2021-09-04 MED ORDER — LISINOPRIL 40 MG PO TABS
40.0000 mg | ORAL_TABLET | Freq: Every day | ORAL | 0 refills | Status: DC
Start: 2021-09-04 — End: 2022-01-14

## 2021-09-04 MED ORDER — ARIPIPRAZOLE 5 MG PO TABS: 5.0000 mg | ORAL_TABLET | Freq: Every day | ORAL | 0 refills | Status: DC

## 2021-09-04 NOTE — [facility] Suicide Risk Assessment (Signed)
Suicide Risk Assessment  Discharge Assessment    [facility] Discharge Suicide Risk Assessment   Principal Problem: MDD (major depressive disorder), recurrent, severe, with psychosis (HCC) Discharge Diagnoses: Principal Problem:   MDD (major depressive disorder), recurrent, severe, with psychosis (HCC) Active Problems:   Severe obesity (BMI >= 40) (HCC)   HYPERTENSION, BENIGN SYSTEMIC   Obstructive sleep apnea   Asthma   Anxiety  HPI:  Laura Benson is a 31 year old female with past diagnoses of MDD, anxiety, hypertension and gastric sleeve surgery who presented to the [employer] health urgent care [facility]) accompanied by law enforcement, after her sister called the police due to concerning messages that pt sent to sister and other family members. Patient was admitted to the [facility] continuous assessment unit. She was recommended for inpatient treatment, and subsequently transferred and admitted voluntarily to this [facility] for treatment and stabilization of her mood.                                               HOSPITAL COURSE During the patient's hospitalization, patient had extensive initial psychiatric evaluation, and follow-up psychiatric evaluations every day.  Diagnoses provided upon initial assessment were as follows:  Principal Problem:   MDD (major depressive disorder), recurrent, severe, with psychosis (HCC) Active Problems:   Severe obesity (BMI >= 40) (HCC)   HYPERTENSION, BENIGN SYSTEMIC   Obstructive sleep apnea   Asthma   Anxiety Patient's psychiatric medications were adjusted on admission as follows: Severe recurrent major depressive disorder with psychotic features (HCC) -Wean off Lexapro-Given 10 mg x 3 days and stopped -Started Cymbalta 30 mg starting 8/6 for MDD & titrated upwards as she came off Lexapro (r/b/se/a to med reviewed and she consented to med trial) -Started Abilify 5 mg nightly for psychosis and augmentation of depression (r/b/se/a to med  reviewed and she consents to med trial) - Checked A1c, EKG with start of antipsychotic and Lipid panel reviewed.    Insomnia -Start Melatonin 3 mg nightly   Anxiety -Continue Hydroxyzine 25 mg every 6 hours PRN   Hypertension -Start Lisinopril 10 mg on 8/6 (historical med) During the hospitalization, other adjustments were made to the patient's medication regimen. Cymbalta was increased to 60 mg daily for management of depressive symptoms and pain. Lisinopril was slowly titrated upwards and reach home dose of 40 mg daily. Medicine team was consulted as pt's BP remained high, and recommendations were given to add Norvasc to medication regimen. Pt is being discharged on Norvasc 10 mg daily along with Lisinopril 40 mg daily for management of her hypertension. She has been educated to contact her Primary care provider as soon as she gets discharged for an appointment to manage her hypertension as well as other medical conditions, and has verbalized understanding. BP is currently elevated, but pt is asymptomatic & denies lightheadedness/dizziness, denies chest pain & nausea.  Patient's care was discussed during the interdisciplinary team meeting every day during the hospitalization. The patient denies having side effects to prescribed psychiatric medication. The patient was evaluated each day by a clinical provider to ascertain response to treatment. Improvement was noted by the patient's report of decreasing symptoms, improved sleep and appetite, affect, medication tolerance, behavior, and participation in unit programming.  Patient was asked each day to complete a self inventory noting mood, mental status, pain, new symptoms, anxiety and concerns.  Symptoms were reported as significantly decreased or resolved completely by discharge. On day of discharge, the patient reports that their mood is stable. The patient denied having suicidal thoughts for more than 48 hours prior to discharge.  Patient denies  having homicidal thoughts.  Patient denies having auditory hallucinations.  Patient denies any visual hallucinations or other symptoms of psychosis. The patient was motivated to continue taking medication with a goal of continued improvement in mental health.   The patient reports their target psychiatric symptoms of depression & insomnia responded well to the psychiatric medications, and the patient reports overall benefit from this psychiatric hospitalization. Supportive psychotherapy was provided to the patient. The patient also participated in regular group therapy while hospitalized. Coping skills, problem solving as well as relaxation therapies were also part of the unit programming.  Labs were reviewed with the patient, and abnormal results were discussed with the patient. Vitamin D level was slightly low during this admission at 26.60, and pt is being supplemented with Vitamin D. She has been educated to continue taking these supplement after discharge, and to f/u with her PCP. Pt educated that she is prediabetic with a Hemoglobin A1C of 6.1. She has been educated to follow this up with her PCP and verbalizes understanding.  Total Time spent with patient: 30 minutes  Musculoskeletal: Strength & Muscle Tone: within normal limits Gait & Station: normal Patient leans: N/A  Psychiatric Specialty Exam  Presentation  General Appearance: Appropriate for Environment; Fairly Groomed  Eye Contact:Good  Speech:Clear and Coherent  Speech Volume:Normal  Handedness:Right  Mood and Affect  Mood:Euthymic  Duration of Depression Symptoms: Greater than two weeks  Affect:Congruent  Thought Process  Thought Processes:Coherent  Descriptions of Associations:Intact  Orientation:Full (Time, Place and Person)  Thought Content:Logical  History of Schizophrenia/Schizoaffective disorder:No data recorded Duration of Psychotic Symptoms:N/A  Hallucinations:Hallucinations: None  Ideas of  Reference:None  Suicidal Thoughts:Suicidal Thoughts: No  Homicidal Thoughts:Homicidal Thoughts: No  Sensorium  Memory:Immediate Good  Judgment:Good  Insight:Good  Executive Functions  Concentration:Good  Attention Span:Good  Recall:Good  Fund of Knowledge:Good  Language:Good  Psychomotor Activity  Psychomotor Activity:Psychomotor Activity: Normal  Assets  Assets:Communication Skills; Housing; Social Support  Sleep  Sleep:Sleep: Good  Physical Exam: Physical Exam Constitutional:      Appearance: Normal appearance.  HENT:     Head: Normocephalic.     Nose: Nose normal. No congestion or rhinorrhea.  Eyes:     Pupils: Pupils are equal, round, and reactive to light.  Pulmonary:     Effort: No respiratory distress.  Musculoskeletal:        General: Normal range of motion.     Cervical back: Normal range of motion.  Neurological:     Mental Status: She is alert and oriented to person, place, and time.     Sensory: No sensory deficit.     Coordination: Coordination normal.  Psychiatric:        Behavior: Behavior normal.        Thought Content: Thought content normal.    Review of Systems  Constitutional: Negative.  Negative for fever.  HENT: Negative.    Eyes: Negative.   Respiratory: Negative.    Cardiovascular: Negative.   Gastrointestinal: Negative.   Genitourinary: Negative.   Musculoskeletal: Negative.   Skin: Negative.   Neurological: Negative.   Endo/Heme/Allergies: Negative.   Psychiatric/Behavioral:  Positive for depression (Patient reports that depressive symptoms have reduced significantly since admission. She denies SI/HI/AVH and verbally contracts for safety outside of this [facility]) and substance abuse (Educated on [facility] use cessation). Negative for hallucinations, memory loss and suicidal ideas. The patient is nervous/anxious (improving with medications) and has insomnia (Resolved with medications).    Blood pressure (!) 159/91, pulse 78,  temperature 98.1 F (36.7 C), temperature source Oral, resp. rate 18, height 5\' 6"  (1.676 m), weight (!) 178.3 kg, SpO2 98 %. Body mass index is 63.43 kg/m.  Mental Status Per Nursing Assessment::   On Admission:  Suicidal ideation indicated by others  Demographic Factors:  Low socioeconomic status  Loss Factors: Financial problems/change in socioeconomic status  Historical Factors: Impulsivity  Risk Reduction Factors:   Living with another person, especially a relative and Positive social support  Continued Clinical Symptoms:  Patient reports that her depressive symptoms have improved significantly since admission. She currently denies, SI/HI/AVH and is verbally contracting for safety outside of this [facility].  Cognitive Features That Contribute To Risk:  None    Suicide Risk:  Minimal: No identifiable suicidal ideation.  Patients presenting with no risk factors but with morbid ruminations; may be classified as minimal risk based on the severity of the depressive symptoms   Follow-up Information     [facility], DO. Schedule an appointment as soon as possible for a visit.   Specialty: Family Medicine Why: Please call this provider to schedule an appointment for primary care services as soon as possible. Contact information: [address] [place] [phone]         [phone], MD Follow up.   Why: Reschedule your appointment for medication management services on 09/03/21 Contact information: [address] [place], [place]        [facility]. Go to.   Specialty: Behavioral Health Why: Please go to this provider for an assessment to obtain therapy and/or medication management services, during walk in times:  Monday or Wednesday, arrive at 7:30 am.  Services are provided on a first come, first served basis. Contact information: [address] [place]  [place] [phone]               Plan Of Care/Follow-up recommendations:  The patient is able to verbalize their individual safety plan to this provider.  # It is recommended to the patient to continue psychiatric medications as prescribed, after discharge from the hospital.    # It is recommended to the patient to follow up with your outpatient psychiatric provider and PCP.  # It was discussed with the patient, the impact of alcohol, drugs, tobacco have been there overall psychiatric and medical wellbeing, and total abstinence from substance use was recommended the patient.ed.  # Prescriptions provided or sent directly to preferred pharmacy at discharge. Patient agreeable to plan. Given opportunity to ask questions. Appears to feel comfortable with discharge.    # In the event of worsening symptoms, the patient is instructed to call the crisis hotline (988), 911 and or go to the nearest ED for appropriate evaluation and treatment of symptoms. To follow-up with primary care provider for other medical issues, concerns and or health care needs  # Patient was discharged home with a plan to follow up as noted above.   04-23-1990, NP 09/04/2021, 11:45 AM

## 2021-09-04 NOTE — Group Note (Signed)
[facility] LCSW Group Therapy Note   Group Date: 09/04/2021 Start Time: 1300 End Time: 1400   Type of Therapy and Topic: Group Therapy: Avoiding Self-Sabotaging and Enabling Behaviors  Participation Level: Did Not Attend  Mood:  Description of Group:  In this group, patients will learn how to identify obstacles, self-sabotaging and enabling behaviors, as well as: what are they, why do we do them and what needs these behaviors meet. Discuss unhealthy relationships and how to have positive healthy boundaries with those that sabotage and enable. Explore aspects of self-sabotage and enabling in yourself and how to limit these self-destructive behaviors in everyday life.   Therapeutic Goals: 1. Patient will identify one obstacle that relates to self-sabotage and enabling behaviors 2. Patient will identify one personal self-sabotaging or enabling behavior they did prior to admission 3. Patient will state a plan to change the above identified behavior 4. Patient will demonstrate ability to communicate their needs through discussion and/or role play.    Summary of Patient Progress:   Patient did not attend group despite encouraged participation.    Therapeutic Modalities:  Cognitive Behavioral Therapy Person-Centered Therapy Motivational Interviewing    Cheyrl Buley W Carole Deere, LCSWA 

## 2021-09-04 NOTE — Progress Notes (Signed)
RN met with pt and reviewed pt's discharge instructions. Pt verbalized understanding of discharge instructions and pt did not have any questions. RN reviewed and provided pt with a copy of SRA, AVS and Transition Record. RN returned pt's belongings to pt. Pt denied SI/HI/AVH and voiced no concerns. Pt was appreciative of the care pt received at [facility]. Patient discharged to the lobby without incident.  

## 2021-09-04 NOTE — [facility] Suicide Risk Assessment (Signed)
[facility] INPATIENT:  Family/Significant Other Suicide Prevention Education  Suicide Prevention Education:  Education Completed; Laura Benson, sister has been identified by the patient as the family member/significant other with whom the patient will be residing, and identified as the person(s) who will aid the patient in the event of a mental health crisis (suicidal ideations/suicide attempt).  With written consent from the patient, the family member/significant other has been provided the following suicide prevention education, prior to the and/or following the discharge of the patient.  In addition to the precautions below, patient's support agrees to monitor patient for safety, ensure treatment compliance, and alert emergency services should patient require such services.  Sister states she has not further concerns about patient.   The suicide prevention education provided includes the following: Suicide risk factors Suicide prevention and interventions National Suicide Hotline telephone number [facility] assessment telephone number [facility] Emergency Assistance 911 [facility] and/or Residential Mobile Crisis Unit telephone number  Request made of family/significant other to: Remove weapons (e.g., guns, rifles, knives), all items previously/currently identified as safety concern.   Remove drugs/medications (over-the-counter, prescriptions, illicit drugs), all items previously/currently identified as a safety concern.  The family member/significant other verbalizes understanding of the suicide prevention education information provided.  The family member/significant other agrees to remove the items of safety concern listed above.  Corky Crafts 09/04/2021, 10:26 AM

## 2021-09-04 NOTE — Progress Notes (Signed)
  [facility] Adult Case Management Discharge Plan :  Will you be returning to the same living situation after discharge:  Yes,  patient to return to place of residence.  At discharge, do you have transportation home?: Yes,  patient's mother to provide transportation from hospital.  Do you have the ability to pay for your medications: No. Patient has no listed insurance, to be provided with 7 day supply of medication at discharge. CSW has provided patient with clinic information for free/reduced medications.   Release of information consent forms completed and in the chart;  Patient's signature needed at discharge.  Patient to Follow up at:  Follow-up Information     Sharlene Dory, DO. Schedule an appointment as soon as possible for a visit.   Specialty: Family Medicine Why: Please call this provider to schedule an appointment for primary care services as soon as possible. Contact information: [address] [place] [address] [phone]         Angelina Ok, MD Follow up.   Why: Reschedule your appointment for medication management services on 09/03/21 Contact information: [address] [place], [place] [address]        [facility]. Go to.   Specialty: Behavioral Health Why: Please go to this provider for an assessment to obtain therapy and/or medication management services, during walk in times:  Monday or Wednesday, arrive at 7:30 am.  Services are provided on a first come, first served basis. Contact information: [address] [place] [address] [phone]                Next level of care provider has access to [facility] Link:no  Safety Planning and Suicide Prevention discussed: Yes,  SPE completed with patient and Wilber Bihari, sister. CSW completed SPE with patient. Discussed potential triggers leading to suicidal ideation in addition to coping skills one might use in order to delay and  distract self from self harming behaviors. CSW encouraged patient to utilize emergency services if they felt unable to maintain their safety. SPE flyer provided to patient at this time.    Has patient been referred to the Quitline?: N/A patient is not a smoker Tobacco Use: Medium Risk (08/31/2021)   Patient History    Smoking Tobacco Use: Former    Smokeless Tobacco Use: Never    Passive Exposure: Not on file   Patient has been referred for addiction treatment: N/A Patient denies active substance use, UDS Negative for all, screened low risk during nursing admission (see [facility] quick tab).  Social History   Substance and Sexual Activity  Drug Use No   Social History   Substance and Sexual Activity  Alcohol Use No   Corky Crafts, LCSWA 09/04/2021, 10:28 AM

## 2021-09-04 NOTE — Progress Notes (Signed)
   09/04/21 0500  Sleep  Number of Hours 4.5

## 2021-09-10 ENCOUNTER — Telehealth (HOSPITAL_COMMUNITY): Payer: Self-pay | Admitting: Family Medicine

## 2021-09-10 NOTE — BH Assessment (Signed)
Care Management - Follow Up [facility] Discharges   Patient has been placed in an inpatient psychiatric hospital [facility]) on 08-30-2021.

## 2021-09-14 NOTE — Discharge Summary (Signed)
Physician Discharge Summary Note  Patient:  Laura Benson is an 31 y.o., female MRN:  [id number] DOB:  1990-06-08 Patient phone:  [phone] (home)  Patient address:   [address] [place] [id number],  Total Time spent with patient: 30 minutes  Date of Admission:  08/31/2021 Date of Discharge: 09/04/2021  Reason for Admission:   Laura Benson is a 31 year old female with past diagnoses of MDD, anxiety, hypertension and gastric sleeve surgery who [place] [facility]) accompanied by law enforcement, after her sister called the police due to concerning messages that pt sent to sister and other family members. [facility]. She was recommended for inpatient treatment, and subsequently transferred and [facility] for treatment and stabilization of her mood.   Principal Problem: MDD (major depressive disorder), recurrent, severe, with psychosis (HCC) Discharge Diagnoses: Principal Problem:   MDD (major depressive disorder), recurrent, severe, with psychosis (HCC) Active Problems:   Severe obesity (BMI >= 40) (HCC)   HYPERTENSION, BENIGN SYSTEMIC   Obstructive sleep apnea   Asthma   Anxiety  Past Psychiatric History: As above  Past Medical History:  Past Medical History:  Diagnosis Date   Anxiety    Asthma    Complication of anesthesia    Take for a while to wake up   Depression    Diabetes mellitus without complication (HCC)    Hypertension    Morbid obesity (HCC)    Sleep apnea    CPAP   Tachycardia     Past Surgical History:  Procedure Laterality Date   ADENOIDECTOMY     LAPAROSCOPIC GASTRIC SLEEVE RESECTION N/A 04/16/2020   Procedure: LAPAROSCOPIC GASTRIC SLEEVE RESECTION;  Surgeon: Gaynelle AduWilson, Eric, MD;  Location: [facility] ORS;  Service: General;  Laterality: N/A;   TONSILLECTOMY     UPPER GI ENDOSCOPY N/A 04/16/2020   Procedure:  UPPER GI ENDOSCOPY;  Surgeon: Gaynelle AduWilson, Eric, MD;  Location: [facility];  Service: General;  Laterality: N/A;   Family History:  Family History  Problem Relation Age of Onset   Hypertension Mother    Diabetes Mother    Colon cancer Father    Hypertension Maternal Grandmother    Hypertension Paternal Grandmother    Diabetes Paternal Grandmother    Breast cancer Cousin    Family Psychiatric  History: none reported Social History:  Social History   Substance and Sexual Activity  Alcohol Use No     Social History   Substance and Sexual Activity  Drug Use No    Social History   Socioeconomic History   Marital status: Single    Spouse name: Not on file   Number of children: 0   Years of education: Not on file   Highest education level: Not on file  Occupational History    Employer: [employer]  Tobacco Use   Smoking status: Former    Years: 2.00    Types: Cigarettes   Smokeless tobacco: Never  Vaping Use   Vaping Use: Never used  Substance and Sexual Activity   Alcohol use: No   Drug use: No   Sexual activity: Yes    Birth control/protection: None  Other Topics Concern   Not on file  Social History Narrative   Not on file   Social Determinants of Health   Financial Resource Strain: Not on file  Food Insecurity: Not on file  Transportation Needs: Not on file  Physical Activity: Not on file  Stress: Not on file  Social Connections: Not on file                                               HOSPITAL COURSE During the patient's hospitalization, patient had extensive initial psychiatric evaluation, and follow-up psychiatric evaluations every day.  Diagnoses provided upon initial assessment were as follows:   Principal Problem:   MDD (major depressive disorder), recurrent, severe, with psychosis (HCC) Active Problems:   Severe obesity (BMI >= 40) (HCC)   HYPERTENSION, BENIGN SYSTEMIC   Obstructive sleep apnea   Asthma   Anxiety Patient's psychiatric medications were  adjusted on admission as follows: Severe recurrent major depressive disorder with psychotic features (HCC) -Wean off Lexapro-Given 10 mg x 3 days and stopped -Started Cymbalta 30 mg starting 8/6 for MDD & titrated upwards as she came off Lexapro (r/b/se/a to med reviewed and she consented to med trial) -Started Abilify 5 mg nightly for psychosis and augmentation of depression (r/b/se/a to med reviewed and she consents to med trial) - Checked A1c, EKG with start of antipsychotic and Lipid panel reviewed.    Insomnia -Start Melatonin 3 mg nightly   Anxiety -Continue Hydroxyzine 25 mg every 6 hours PRN   Hypertension -Start Lisinopril 10 mg on 8/6 (historical med) During the hospitalization, other adjustments were made to the patient's medication regimen. Cymbalta was increased to 60 mg daily for management of depressive symptoms and pain. Lisinopril was slowly titrated upwards and reach home dose of 40 mg daily. Medicine team was consulted as pt's BP remained high, and recommendations were given to add Norvasc to medication regimen. Pt is being discharged on Norvasc 10 mg daily along with Lisinopril 40 mg daily for management of her hypertension. She has been educated to contact her Primary care provider as soon as she gets discharged for an appointment to manage her hypertension as well as other medical conditions, and has verbalized understanding. BP is currently elevated, but pt is asymptomatic & denies lightheadedness/dizziness, denies chest pain & nausea.   Patient's care was discussed during the interdisciplinary team meeting every day during the hospitalization. The patient denies having side effects to prescribed psychiatric medication. The patient was evaluated each day by a clinical provider to ascertain response to treatment. Improvement was noted by the patient's report of decreasing symptoms, improved sleep and appetite, affect, medication tolerance, behavior, and participation in unit  programming.  Patient was asked each day to complete a self inventory noting mood, mental status, pain, new symptoms, anxiety and concerns.     Symptoms were reported as significantly decreased or resolved completely by discharge. On day of discharge, the patient reports that their mood is stable. The patient denied having suicidal thoughts for more than 48 hours prior to discharge.  Patient denies having homicidal thoughts.  Patient denies having auditory hallucinations.  Patient denies any visual hallucinations or other symptoms of psychosis. The patient was motivated to continue taking medication with a goal of continued improvement in mental health.    The patient reports their target psychiatric symptoms of depression & insomnia responded well to the psychiatric medications, and the patient reports overall benefit from this psychiatric hospitalization. Supportive psychotherapy was provided to the patient. The patient also participated in regular group therapy while hospitalized. Coping skills, problem solving as well as relaxation therapies were  also part of the unit programming.   Labs were reviewed with the patient, and abnormal results were discussed with the patient. Vitamin D level was slightly low during this admission at 26.60, and pt is being supplemented with Vitamin D. She has been educated to continue taking these supplement after discharge, and to f/u with her PCP. Pt educated that she is prediabetic with a Hemoglobin A1C of 6.1. She has been educated to follow this up with her PCP and verbalizes understanding.  Physical Findings: AIMS: Facial and Oral Movements Muscles of Facial Expression: None, normal Lips and Perioral Area: None, normal Jaw: None, normal Tongue: None, normal,Extremity Movements Upper (arms, wrists, hands, fingers): None, normal Lower (legs, knees, ankles, toes): None, normal, Trunk Movements Neck, shoulders, hips: None, normal, Overall Severity Severity of  abnormal movements (highest score from questions above): None, normal Incapacitation due to abnormal movements: None, normal Patient's awareness of abnormal movements (rate only patient's report): No Awareness, Dental Status Current problems with teeth and/or dentures?: No Does patient usually wear dentures?: No  CIWA:   n/a COWS:   n/a  Musculoskeletal: Strength & Muscle Tone: within normal limits Gait & Station: normal Patient leans: N/A   Psychiatric Specialty Exam:  Presentation  General Appearance: Appropriate for Environment; Fairly Groomed  Eye Contact:Good  Speech:Clear and Coherent  Speech Volume:Normal  Handedness:Right   Mood and Affect  Mood:Euthymic  Affect:Congruent   Thought Process  Thought Processes:Coherent  Descriptions of Associations:Intact  Orientation:Full (Time, Place and Person)  Thought Content:Logical  History of Schizophrenia/Schizoaffective disorder:No data recorded Duration of Psychotic Symptoms:N/A  Hallucinations:No data recorded Ideas of Reference:None  Suicidal Thoughts:No data recorded Homicidal Thoughts:No data recorded  Sensorium  Memory:Immediate Good  Judgment:Good  Insight:Good  Executive Functions  Concentration:Good  Attention Span:Good  Recall:Good  Fund of Knowledge:Good  Language:Good  Psychomotor Activity  Psychomotor Activity:No data recorded  Assets  Assets:Communication Skills; Housing; Social Support  Sleep  Sleep:No data recorded  Physical Exam: Physical Exam ROS Blood pressure (!) 159/91, pulse 78, temperature 98.1 F (36.7 C), temperature source Oral, resp. rate 18, height 5\' 6"  (1.676 m), weight (!) 178.3 kg, SpO2 98 %. Body mass index is 63.43 kg/m.   Social History   Tobacco Use  Smoking Status Former   Years: 2.00   Types: Cigarettes  Smokeless Tobacco Never   Tobacco Cessation:  N/A, patient does not currently use tobacco products   Blood Alcohol level:  Lab  Results  Component Value Date   ETH <10 08/30/2021   ETH <10 05/04/2017    Metabolic Disorder Labs:  Lab Results  Component Value Date   HGBA1C 6.1 (H) 09/01/2021   MPG 128.37 09/01/2021   MPG 208.73 04/05/2020   No results found for: "PROLACTIN" Lab Results  Component Value Date   CHOL 178 08/30/2021   TRIG 57 08/30/2021   HDL 48 08/30/2021   CHOLHDL 3.7 08/30/2021   VLDL 11 08/30/2021   LDLCALC 119 (H) 08/30/2021   LDLCALC 107 (H) 07/01/2021    See Psychiatric Specialty Exam and Suicide Risk Assessment completed by Attending Physician prior to discharge.  Discharge destination:  Home  Is patient on multiple antipsychotic therapies at discharge:  No   Has Patient had three or more failed trials of antipsychotic monotherapy by history:  No  Recommended Plan for Multiple Antipsychotic Therapies: NA   Allergies as of 09/04/2021   No Known Allergies      Medication List     STOP taking these medications  escitalopram 20 MG tablet Commonly known as: LEXAPRO       TAKE these medications      Indication  albuterol 108 (90 Base) MCG/ACT inhaler Commonly known as: VENTOLIN HFA Inhale 2 puffs into the lungs every 6 (six) hours as needed for wheezing or shortness of breath.  Indication: Spasm of Lung Air Passages   amLODipine 10 MG tablet Commonly known as: NORVASC Take 1 tablet (10 mg total) by mouth daily.  Indication: High Blood Pressure Disorder   ARIPiprazole 5 MG tablet Commonly known as: ABILIFY Take 1 tablet (5 mg total) by mouth at bedtime.  Indication: Major Depressive Disorder, psychosis & MDD augmentation   DULoxetine 60 MG capsule Commonly known as: CYMBALTA Take 1 capsule (60 mg total) by mouth daily.  Indication: Major Depressive Disorder, Disease of the Peripheral Nerves   hydrOXYzine 25 MG tablet Commonly known as: ATARAX Take 1 tablet (25 mg total) by mouth 3 (three) times daily as needed for anxiety.  Indication: Feeling Anxious    lidocaine 5 % Commonly known as: LIDODERM Place 1 patch onto the skin daily. Remove & Discard patch within 12 hours or as directed by MD  Indication: lower back pain   lisinopril 40 MG tablet Commonly known as: ZESTRIL Take 1 tablet (40 mg total) by mouth daily.  Indication: High Blood Pressure Disorder   melatonin 3 MG Tabs tablet Take 1 tablet (3 mg total) by mouth at bedtime.  Indication: Trouble Sleeping   norethindrone-ethinyl estradiol-FE 1-20 MG-MCG tablet Commonly known as: LOESTRIN FE Take 1 tablet by mouth daily.  Indication: Pain During Periods   Vitamin D (Ergocalciferol) 1.25 MG (50000 UNIT) Caps capsule Commonly known as: DRISDOL Take 1 capsule (50,000 Units total) by mouth every 7 (seven) days.  Indication: Vitamin D Deficiency        Follow-up Information     Sharlene Dory, DO. Schedule an appointment as soon as possible for a visit.   Specialty: Family Medicine Why: Please call this provider to schedule an appointment for primary care services as soon as possible. Contact information: [address] [place] [address] [phone]         Angelina Ok, MD Follow up.   Why: Reschedule your appointment for medication management services on 09/03/21 Contact information: [address] [place], [place] [address]        [facility]. Go to.   Specialty: Behavioral Health Why: Please go to this provider for an assessment to obtain therapy and/or medication management services, during walk in times:  Monday or Wednesday, arrive at 7:30 am.  Services are provided on a first come, first served basis. Contact information: [address] [place] [address] [phone]               Follow-up recommendations:   The patient is able to verbalize their individual safety plan to this provider.   # It is recommended to the patient to continue psychiatric medications as prescribed,  after discharge from the hospital.     # It is recommended to the patient to follow up with your outpatient psychiatric provider and PCP.   # It was discussed with the patient, the impact of alcohol, drugs, tobacco have been there overall psychiatric and medical wellbeing, and total abstinence from substance use was recommended the patient.ed.   # Prescriptions provided or sent directly to preferred pharmacy at discharge. Patient agreeable to plan. Given opportunity to ask questions.  Appears to feel comfortable with discharge.    # In the event of worsening symptoms, the patient is instructed to call the crisis hotline (988), 911 and or go to the nearest ED for appropriate evaluation and treatment of symptoms. To follow-up with primary care provider for other medical issues, concerns and or health care needs   # Patient was discharged home with a plan to follow up as noted above.    Signed: Starleen Blue, NP 09/14/2021, 9:09 AM

## 2021-09-25 ENCOUNTER — Emergency Department (HOSPITAL_COMMUNITY)
Admission: EM | Admit: 2021-09-25 | Discharge: 2021-09-25 | Disposition: A | Discharge status: Home / Self Care | Payer: Medicaid Other | Attending: Emergency Medicine | Admitting: Emergency Medicine

## 2021-09-25 ENCOUNTER — Other Ambulatory Visit (HOSPITAL_COMMUNITY): Payer: Self-pay

## 2021-09-25 ENCOUNTER — Encounter: Payer: Self-pay | Admitting: Nurse Practitioner

## 2021-09-25 DIAGNOSIS — N9489 Other specified conditions associated with female genital organs and menstrual cycle: Secondary | ICD-10-CM | POA: Insufficient documentation

## 2021-09-25 DIAGNOSIS — Z79899 Other long term (current) drug therapy: Secondary | ICD-10-CM | POA: Insufficient documentation

## 2021-09-25 DIAGNOSIS — R1084 Generalized abdominal pain: Secondary | ICD-10-CM | POA: Insufficient documentation

## 2021-09-25 DIAGNOSIS — D649 Anemia, unspecified: Secondary | ICD-10-CM | POA: Insufficient documentation

## 2021-09-25 DIAGNOSIS — R197 Diarrhea, unspecified: Secondary | ICD-10-CM | POA: Insufficient documentation

## 2021-09-25 DIAGNOSIS — E119 Type 2 diabetes mellitus without complications: Secondary | ICD-10-CM | POA: Insufficient documentation

## 2021-09-25 LAB — CBC WITH DIFFERENTIAL/PLATELET
Abs Immature Granulocytes: 0.01 10*3/uL (ref 0.00–0.07)
Basophils Absolute: 0 10*3/uL (ref 0.0–0.1)
Basophils Relative: 0 %
Eosinophils Absolute: 0.1 10*3/uL (ref 0.0–0.5)
Eosinophils Relative: 2 %
HCT: 37.3 % (ref 36.0–46.0)
Hemoglobin: 11.3 g/dL — ABNORMAL LOW (ref 12.0–15.0)
Immature Granulocytes: 0 %
Lymphocytes Relative: 42 %
Lymphs Abs: 2.1 10*3/uL (ref 0.7–4.0)
MCH: 22.8 pg — ABNORMAL LOW (ref 26.0–34.0)
MCHC: 30.3 g/dL (ref 30.0–36.0)
MCV: 75.2 fL — ABNORMAL LOW (ref 80.0–100.0)
Monocytes Absolute: 0.8 10*3/uL (ref 0.1–1.0)
Monocytes Relative: 15 %
Neutro Abs: 2.1 10*3/uL (ref 1.7–7.7)
Neutrophils Relative %: 41 %
Platelets: 273 10*3/uL (ref 150–400)
RBC: 4.96 MIL/uL (ref 3.87–5.11)
RDW: 15.6 % — ABNORMAL HIGH (ref 11.5–15.5)
WBC: 5.2 10*3/uL (ref 4.0–10.5)
nRBC: 0 % (ref 0.0–0.2)

## 2021-09-25 LAB — URINALYSIS, ROUTINE W REFLEX MICROSCOPIC
Bilirubin Urine: NEGATIVE
Glucose, UA: NEGATIVE mg/dL
Hgb urine dipstick: NEGATIVE
Ketones, ur: NEGATIVE mg/dL
Leukocytes,Ua: NEGATIVE
Nitrite: NEGATIVE
Protein, ur: 30 mg/dL — AB
Specific Gravity, Urine: 1.028 (ref 1.005–1.030)
pH: 5 (ref 5.0–8.0)

## 2021-09-25 LAB — COMPREHENSIVE METABOLIC PANEL
ALT: 16 U/L (ref 0–44)
AST: 17 U/L (ref 15–41)
Albumin: 3.5 g/dL (ref 3.5–5.0)
Alkaline Phosphatase: 55 U/L (ref 38–126)
Anion gap: 5 (ref 5–15)
BUN: 7 mg/dL (ref 6–20)
CO2: 23 mmol/L (ref 22–32)
Calcium: 8.6 mg/dL — ABNORMAL LOW (ref 8.9–10.3)
Chloride: 111 mmol/L (ref 98–111)
Creatinine, Ser: 0.82 mg/dL (ref 0.44–1.00)
GFR, Estimated: >60 mL/min (ref >60)
Glucose, Bld: 116 mg/dL — ABNORMAL HIGH (ref 70–99)
Potassium: 3.7 mmol/L (ref 3.5–5.1)
Sodium: 139 mmol/L (ref 135–145)
Total Bilirubin: 0.6 mg/dL (ref 0.3–1.2)
Total Protein: 7 g/dL (ref 6.5–8.1)

## 2021-09-25 LAB — I-STAT BETA HCG BLOOD, ED (MC, WL, AP ONLY): I-stat hCG, quantitative: <5 m[IU]/mL (ref <5)

## 2021-09-25 LAB — LIPASE, BLOOD: Lipase: 28 U/L (ref 11–51)

## 2021-09-25 MED ORDER — DICYCLOMINE HCL 10 MG/ML IM SOLN
20.0000 mg | Freq: Once | INTRAMUSCULAR | Status: AC
  Administered 2021-09-25: 20 mg via INTRAMUSCULAR
  Filled 2021-09-25: qty 2

## 2021-09-25 MED ORDER — DICYCLOMINE HCL 20 MG PO TABS
20.0000 mg | ORAL_TABLET | Freq: Two times a day (BID) | ORAL | 0 refills | Status: DC
  Filled 2021-09-25: qty 20, 10d supply, fill #0

## 2021-09-25 MED ORDER — SODIUM CHLORIDE 0.9 % IV BOLUS
1000.0000 mL | Freq: Once | INTRAVENOUS | Status: AC
  Administered 2021-09-25: 1000 mL via INTRAVENOUS

## 2021-09-25 NOTE — ED Provider Notes (Signed)
[facility]-EMERGENCY DEPT Provider Note   CSN: [id number] Arrival date & time: 09/25/21  6387     History  Chief Complaint  Patient presents with   Abdominal Pain    Laura Benson is a 31 y.o. female.   Abdominal Pain    Patient with medical history of previous gastric sleeve 1 year ago, MDD, type 2 diabetes, anxiety presents today due to diarrhea and abdominal pain.  Symptoms started 3 days ago, the abdominal pain is constant, severity is also constant but it is worse after she eats or drinks.  It is all across her abdomen does not radiate to her back.  It feels like a cramping pain, no nausea or vomiting.  Does endorse diarrhea with slight mucus but no blood.  She is taken Tylenol with no improvement.  No dysuria, hematuria, syncope, alcohol use, vaginal bleeding, vaginal discharge.  Denies recent antibiotic use or travel.  Home Medications Prior to Admission medications   Medication Sig Start Date End Date Taking? Authorizing Provider  albuterol (VENTOLIN HFA) 108 (90 Base) MCG/ACT inhaler Inhale 2 puffs into the lungs every 6 (six) hours as needed for wheezing or shortness of breath.    [provider]  amLODipine (NORVASC) 10 MG tablet Take 1 tablet (10 mg total) by mouth daily. 09/05/21   Starleen Blue, NP  ARIPiprazole (ABILIFY) 5 MG tablet Take 1 tablet (5 mg total) by mouth at bedtime. 09/04/21   Starleen Blue, NP  DULoxetine (CYMBALTA) 60 MG capsule Take 1 capsule (60 mg total) by mouth daily. 09/05/21   Starleen Blue, NP  hydrOXYzine (ATARAX) 25 MG tablet Take 1 tablet (25 mg total) by mouth 3 (three) times daily as needed for anxiety. 09/04/21   Starleen Blue, NP  lidocaine (LIDODERM) 5 % Place 1 patch onto the skin daily. Remove & Discard patch within 12 hours or as directed by MD 09/05/21   Starleen Blue, NP  lisinopril (ZESTRIL) 40 MG tablet Take 1 tablet (40 mg total) by mouth daily. 09/04/21   Starleen Blue, NP  melatonin 3 MG TABS  tablet Take 1 tablet (3 mg total) by mouth at bedtime. 09/04/21   Starleen Blue, NP  norethindrone-ethinyl estradiol-FE (LOESTRIN FE) 1-20 MG-MCG tablet Take 1 tablet by mouth daily. 07/03/21   Sharlene Dory, DO  Vitamin D, Ergocalciferol, (DRISDOL) 1.25 MG (50000 UNIT) CAPS capsule Take 1 capsule (50,000 Units total) by mouth every 7 (seven) days. 09/09/21   Starleen Blue, NP      Allergies    Patient has no known allergies.    Review of Systems   Review of Systems  Gastrointestinal:  Positive for abdominal pain.    Physical Exam Updated Vital Signs BP (!) 193/113 (BP Location: Left Arm) Comment (BP Location): forearm  Pulse 84   Temp 98.7 F (37.1 C) (Oral)   Resp 18   SpO2 97%  Physical Exam Vitals and nursing note reviewed. Exam conducted with a chaperone present.  Constitutional:      Appearance: Normal appearance.  HENT:     Head: Normocephalic and atraumatic.  Eyes:     General: No scleral icterus.       Right eye: No discharge.        Left eye: No discharge.     Extraocular Movements: Extraocular movements intact.     Pupils: Pupils are equal, round, and reactive to light.  Cardiovascular:     Rate and Rhythm: Normal rate and regular rhythm.  Pulses: Normal pulses.     Heart sounds: Normal heart sounds. No murmur heard.    No friction rub. No gallop.  Pulmonary:     Effort: Pulmonary effort is normal. No respiratory distress.     Breath sounds: Normal breath sounds.  Abdominal:     General: Abdomen is flat. A surgical scar is present. Bowel sounds are normal. There is no distension.     Palpations: Abdomen is soft.     Tenderness: There is generalized abdominal tenderness. There is no right CVA tenderness or left CVA tenderness.  Skin:    General: Skin is warm and dry.     Coloration: Skin is not jaundiced.  Neurological:     Mental Status: She is alert. Mental status is at baseline.     Coordination: Coordination normal.     ED Results /  Procedures / Treatments   Labs (all labs ordered are listed, but only abnormal results are displayed) Labs Reviewed  COMPREHENSIVE METABOLIC PANEL - Abnormal; Notable for the following components:      Result Value   Glucose, Bld 116 (*)    Calcium 8.6 (*)    All other components within normal limits  CBC WITH DIFFERENTIAL/PLATELET - Abnormal; Notable for the following components:   Hemoglobin 11.3 (*)    MCV 75.2 (*)    MCH 22.8 (*)    RDW 15.6 (*)    All other components within normal limits  URINALYSIS, ROUTINE W REFLEX MICROSCOPIC - Abnormal; Notable for the following components:   Color, Urine YELLOW (*)    APPearance TURBID (*)    Protein, ur 30 (*)    Bacteria, UA RARE (*)    All other components within normal limits  LIPASE, BLOOD  I-STAT BETA HCG BLOOD, ED ([facility], [facility], [facility] ONLY)    EKG None  Radiology No results found.  Procedures Procedures    Medications Ordered in ED Medications  sodium chloride 0.9 % bolus 1,000 mL (1,000 mLs Intravenous New Bag/Given 09/25/21 0733)  dicyclomine (BENTYL) injection 20 mg (20 mg Intramuscular Given 09/25/21 0102)    ED Course/ Medical Decision Making/ A&P                           Medical Decision Making Amount and/or Complexity of Data Reviewed Labs: ordered.  Risk Prescription drug management.   Patient presents with diffuse abdominal pain and diarrhea.  Differential includes but not limited to enteritis, status, dehydration, AKI, gross lecture derangement, medication reaction.  Considered ovarian torsion, pelvic inflammatory, vaginitis but based on history and physical exam I do not think gynecologic involvement is likely.  Considered C. difficile no recent antibiotic use or travel, does not appear consistent.  Do not think stool cultures indicated.  Patient is hypertensive at 193/113 but vitals otherwise unremarkable.  Not septic, no focal abdominal tenderness or peritoneal signs.  I reviewed external medical records,  patient had gastric sleeve done about a year ago.  Not a candidate for anti-inflammatory medicine.  Social determinants of health: Patient does not currently have medical insurance, she is set to get insurance next week but currently is not covered.  I ordered, viewed and interpreted laboratory work-up.   -CBC: No leukocytosis, mild anemia hemoglobin 11.3.   -CMP: No gross electrolyte derangement or AKI, no transaminitis.   -Lipase is within normal limits. -Not an ectopic pregnancy.  -UA pending  Ordered fluids and Bentyl.  Repeat abdominal exam is benign.  Considered CT but given lack of focality and that the pain is just associate with diarrhea I think this is most likely an enteritis and I do not think a CT scan would change disposition or management.  Discussed outpatient follow-up with gastroenterology, I will provide a referral.  There is no family disease of UC or crohns which I think is less likely but considered in the setting of mucus and diarrhea although I suspect acute enteritis is more likely.  Return precautions were discussed, will discharge with Bentyl.          Final Clinical Impression(s) / ED Diagnoses Final diagnoses:  None    Rx / DC Orders ED Discharge Orders     None         Sherrill Raring, Hershal Coria 09/25/21 1428    Lacretia Leigh, MD 09/26/21 [phone]

## 2021-09-25 NOTE — Discharge Instructions (Signed)
Take Bentyl twice daily for the next week.  Drink plenty of fluids.  Can take Tylenol for pain.  Call and schedule an appointment with gastroenterology, information above.  Return to ED if you are unable to eat or drink, the pain localizes to one side of your abdomen, new or concerning symptoms.

## 2021-09-25 NOTE — ED Triage Notes (Signed)
Pt reports with sharp lower abdominal pain and diarrhea x 3 days. Pt reports having a weight loss surgery last year.

## 2021-09-27 ENCOUNTER — Emergency Department (HOSPITAL_COMMUNITY)
Admission: EM | Admit: 2021-09-27 | Discharge: 2021-09-27 | Disposition: A | Discharge status: Home / Self Care | Payer: BLUE CROSS/BLUE SHIELD | Attending: Emergency Medicine | Admitting: Emergency Medicine

## 2021-09-27 ENCOUNTER — Encounter (HOSPITAL_COMMUNITY): Payer: Self-pay

## 2021-09-27 ENCOUNTER — Emergency Department (HOSPITAL_COMMUNITY): Payer: BLUE CROSS/BLUE SHIELD

## 2021-09-27 ENCOUNTER — Other Ambulatory Visit: Payer: Self-pay

## 2021-09-27 DIAGNOSIS — K529 Noninfective gastroenteritis and colitis, unspecified: Secondary | ICD-10-CM | POA: Diagnosis not present

## 2021-09-27 DIAGNOSIS — R1032 Left lower quadrant pain: Secondary | ICD-10-CM | POA: Diagnosis present

## 2021-09-27 LAB — COMPREHENSIVE METABOLIC PANEL
ALT: 15 U/L (ref 0–44)
AST: 14 U/L — ABNORMAL LOW (ref 15–41)
Albumin: 3.6 g/dL (ref 3.5–5.0)
Alkaline Phosphatase: 62 U/L (ref 38–126)
Anion gap: 6 (ref 5–15)
BUN: 6 mg/dL (ref 6–20)
CO2: 25 mmol/L (ref 22–32)
Calcium: 8.6 mg/dL — ABNORMAL LOW (ref 8.9–10.3)
Chloride: 107 mmol/L (ref 98–111)
Creatinine, Ser: 0.76 mg/dL (ref 0.44–1.00)
GFR, Estimated: >60 mL/min (ref >60)
Glucose, Bld: 107 mg/dL — ABNORMAL HIGH (ref 70–99)
Potassium: 3.5 mmol/L (ref 3.5–5.1)
Sodium: 138 mmol/L (ref 135–145)
Total Bilirubin: 0.8 mg/dL (ref 0.3–1.2)
Total Protein: 7.4 g/dL (ref 6.5–8.1)

## 2021-09-27 LAB — URINALYSIS, ROUTINE W REFLEX MICROSCOPIC
Bilirubin Urine: NEGATIVE
Glucose, UA: NEGATIVE mg/dL
Hgb urine dipstick: NEGATIVE
Ketones, ur: 20 mg/dL — AB
Leukocytes,Ua: NEGATIVE
Nitrite: NEGATIVE
Protein, ur: 30 mg/dL — AB
Specific Gravity, Urine: 1.027 (ref 1.005–1.030)
pH: 5 (ref 5.0–8.0)

## 2021-09-27 LAB — CBC WITH DIFFERENTIAL/PLATELET
Abs Immature Granulocytes: 0.02 10*3/uL (ref 0.00–0.07)
Basophils Absolute: 0 10*3/uL (ref 0.0–0.1)
Basophils Relative: 0 %
Eosinophils Absolute: 0.1 10*3/uL (ref 0.0–0.5)
Eosinophils Relative: 3 %
HCT: 38.7 % (ref 36.0–46.0)
Hemoglobin: 12 g/dL (ref 12.0–15.0)
Immature Granulocytes: 0 %
Lymphocytes Relative: 34 %
Lymphs Abs: 1.8 10*3/uL (ref 0.7–4.0)
MCH: 22.9 pg — ABNORMAL LOW (ref 26.0–34.0)
MCHC: 31 g/dL (ref 30.0–36.0)
MCV: 73.9 fL — ABNORMAL LOW (ref 80.0–100.0)
Monocytes Absolute: 0.6 10*3/uL (ref 0.1–1.0)
Monocytes Relative: 12 %
Neutro Abs: 2.6 10*3/uL (ref 1.7–7.7)
Neutrophils Relative %: 51 %
Platelets: 343 10*3/uL (ref 150–400)
RBC: 5.24 MIL/uL — ABNORMAL HIGH (ref 3.87–5.11)
RDW: 15.3 % (ref 11.5–15.5)
WBC: 5.2 10*3/uL (ref 4.0–10.5)
nRBC: 0 % (ref 0.0–0.2)

## 2021-09-27 LAB — I-STAT BETA HCG BLOOD, ED (MC, WL, AP ONLY): I-stat hCG, quantitative: <5 m[IU]/mL (ref <5)

## 2021-09-27 LAB — LIPASE, BLOOD: Lipase: 29 U/L (ref 11–51)

## 2021-09-27 MED ORDER — FAMOTIDINE 20 MG PO TABS: 20.0000 mg | ORAL_TABLET | Freq: Two times a day (BID) | ORAL | 0 refills | Status: DC

## 2021-09-27 MED ORDER — IOHEXOL 300 MG/ML  SOLN
100.0000 mL | Freq: Once | INTRAMUSCULAR | Status: AC | PRN
  Administered 2021-09-27: 100 mL via INTRAVENOUS

## 2021-09-27 MED ORDER — ONDANSETRON HCL 4 MG/2ML IJ SOLN
4.0000 mg | Freq: Once | INTRAMUSCULAR | Status: AC
  Administered 2021-09-27: 4 mg via INTRAVENOUS
  Filled 2021-09-27: qty 2

## 2021-09-27 MED ORDER — ACETAMINOPHEN 500 MG PO TABS
1000.0000 mg | ORAL_TABLET | Freq: Once | ORAL | Status: AC
  Administered 2021-09-27: 1000 mg via ORAL
  Filled 2021-09-27: qty 2

## 2021-09-27 MED ORDER — LACTATED RINGERS IV BOLUS
1000.0000 mL | Freq: Once | INTRAVENOUS | Status: AC
  Administered 2021-09-27: 1000 mL via INTRAVENOUS

## 2021-09-27 MED ORDER — OXYCODONE HCL 5 MG PO TABS
5.0000 mg | ORAL_TABLET | Freq: Once | ORAL | Status: AC
  Administered 2021-09-27: 5 mg via ORAL
  Filled 2021-09-27: qty 1

## 2021-09-27 MED ORDER — FAMOTIDINE 20 MG PO TABS
20.0000 mg | ORAL_TABLET | Freq: Once | ORAL | Status: AC
  Administered 2021-09-27: 20 mg via ORAL
  Filled 2021-09-27: qty 1

## 2021-09-27 MED ORDER — ONDANSETRON HCL 4 MG PO TABS: 4.0000 mg | ORAL_TABLET | Freq: Four times a day (QID) | ORAL | 0 refills | Status: AC

## 2021-09-27 NOTE — ED Provider Triage Note (Signed)
Emergency Medicine Provider Triage Evaluation Note  Laura Benson , a 31 y.o. female  was evaluated in triage.  Pt complains of significant abdominal pain associated with lack of appetite.  Recently evaluated on 8/30 reports since then pain is gotten worse.  Unable to get in with gastroenterology until October.  Denies fever.  Review of Systems  Positive: As above Negative: As above  Physical Exam  BP (!) 179/122 (BP Location: Left Arm)   Pulse 88   Temp 99.3 F (37.4 C) (Oral)   Resp 20   SpO2 99%  Gen:   Awake, no distress   Resp:  Normal effort  MSK:   Moves extremities without difficulty  Other:  Generalized abdominal tenderness  Medical Decision Making  Medically screening exam initiated at 5:47 PM.  Appropriate orders placed.  Bralee Feldt was informed that the remainder of the evaluation will be completed by another provider, this initial triage assessment does not replace that evaluation, and the importance of remaining in the ED until their evaluation is complete.     Marita [place], PA-C 09/27/21 [phone]

## 2021-09-27 NOTE — ED Triage Notes (Signed)
Patient c/o sharp epigastric pain  to the lower abdomen x 5 days. Patient states she feels bloated and has had watery diarrhea after eating or drinking water. Patient states she had a gastric sleeve 04/16/20.

## 2021-09-27 NOTE — ED Provider Notes (Signed)
[facility]-EMERGENCY DEPT Provider Note   CSN: [id number] Arrival date & time: 09/27/21  1729     History Chief Complaint  Patient presents with   Abdominal Pain   Diarrhea    HPI Laura Benson is a 31 y.o. female presenting for abdominal pain and diarrhea.  Patient states epigastric pain over the last 5 days.  Patient states bloating and watery diarrhea.  Patient feels dehydrated.  Patient was seen yesterday for similar.   She states that in the interim, she called her bariatric surgeon and recommend she come to the emergency department for cross-sectional imaging.  She is otherwise having daily diarrhea, pain in her left lower quadrant.  She denies fevers.  No known sick contacts.  No history of similar. Patient's recorded medical, surgical, social, medication list and allergies were reviewed in the Snapshot window as part of the initial history.   Review of Systems   Review of Systems  Constitutional:  Negative for chills and fever.  HENT:  Negative for ear pain and sore throat.   Eyes:  Negative for pain and visual disturbance.  Respiratory:  Negative for cough and shortness of breath.   Cardiovascular:  Negative for chest pain and palpitations.  Gastrointestinal:  Positive for abdominal pain, diarrhea and nausea. Negative for vomiting.  Genitourinary:  Negative for dysuria and hematuria.  Musculoskeletal:  Negative for arthralgias and back pain.  Skin:  Negative for color change and rash.  Neurological:  Negative for seizures and syncope.  All other systems reviewed and are negative.   Physical Exam Updated Vital Signs BP (!) 141/85   Pulse 79   Temp 98 F (36.7 C) (Oral)   Resp 16   Ht 5\' 6"  (1.676 m)   Wt (!) 172.8 kg   LMP 09/20/2021   SpO2 97%   BMI 61.50 kg/m  Physical Exam Vitals and nursing note reviewed.  Constitutional:      General: She is not in acute distress.    Appearance: She is well-developed.  HENT:     Head:  Normocephalic and atraumatic.  Eyes:     Conjunctiva/sclera: Conjunctivae normal.  Cardiovascular:     Rate and Rhythm: Normal rate and regular rhythm.     Heart sounds: No murmur heard. Pulmonary:     Effort: Pulmonary effort is normal. No respiratory distress.     Breath sounds: Normal breath sounds.  Abdominal:     General: There is no distension.     Palpations: Abdomen is soft.     Tenderness: There is abdominal tenderness. There is no right CVA tenderness, left CVA tenderness or guarding.  Musculoskeletal:        General: No swelling or tenderness. Normal range of motion.     Cervical back: Neck supple.  Skin:    General: Skin is warm and dry.  Neurological:     General: No focal deficit present.     Mental Status: She is alert and oriented to person, place, and time. Mental status is at baseline.     Cranial Nerves: No cranial nerve deficit.      ED Course/ Medical Decision Making/ A&P Clinical Course as of 09/27/21 2129  Fri Sep 27, 2021  2008 Labs 2009. Pending CT [CC]    Clinical Course User Index [CC] [place], MD    Procedures Procedures   Medications Ordered in ED Medications  lactated ringers bolus 1,000 mL (1,000 mLs Intravenous New Bag/Given 09/27/21 2001)  acetaminophen (TYLENOL) tablet 1,000  mg (1,000 mg Oral Given 09/27/21 1952)  famotidine (PEPCID) tablet 20 mg (20 mg Oral Given 09/27/21 1952)  oxyCODONE (Oxy IR/ROXICODONE) immediate release tablet 5 mg (5 mg Oral Given 09/27/21 1952)  ondansetron (ZOFRAN) injection 4 mg (4 mg Intravenous Given 09/27/21 2001)  iohexol (OMNIPAQUE) 300 MG/ML solution 100 mL (100 mLs Intravenous Contrast Given 09/27/21 2038)   Medical Decision Making:    [name] is a 31 y.o. female who presented to the ED today with abdominal pain, detailed above.    Patient's presentation is complicated by their history of multiple comorbid medical problems including bariatric surgery candidate, status post gastric sleeve,  hypertension..  Patient placed on continuous vitals and telemetry monitoring while in ED which was reviewed periodically.  Complete initial physical exam performed, notably the patient  was hemodynamically stable in no acute distress.  She has diffuse abdominal tenderness with no focal guarding.     Reviewed and confirmed nursing documentation for past medical history, family history, social history.    Initial Assessment:   With the patient's presentation of abdominal pain, most likely diagnosis is nonspecific etiology. Other diagnoses were considered including (but not limited to) gastroenteritis, colitis, small bowel obstruction, appendicitis, cholecystitis, pancreatitis, nephrolithiasis, UTI, pyleonephritis, ruptured ectopic pregnancy, PID, ovarian torsion. These are considered less likely due to history of present illness and physical exam findings.   This is most consistent with an acute life/limb threatening illness complicated by underlying chronic conditions.   Initial Plan:   CBC/CMP to evaluate for underlying infectious/metabolic etiology for patient's abdominal pain  Lipase to evaluate for pancreatitis  EKG to evaluate for cardiac source of pain  CTAB/Pelvis with contrast to evaluate for structural/surgical etiology of patients' severe abdominal pain.  Urinalysis and repeat physical assessment to evaluate for UTI/Pyelonpehritis  Empiric management of symptoms with escalating pain control and antiemetics as needed.   Initial Study Results:    Laboratory  All laboratory results reviewed without evidence of clinically relevant pathology.    Radiology All images reviewed independently. Agree with radiology report at this time.   CT ABDOMEN PELVIS W CONTRAST  Result Date: 09/27/2021 CLINICAL DATA:  Abdominal pain. Sharp epigastric pain radiating towards the abdomen for 5 days. Diarrhea. EXAM: CT ABDOMEN AND PELVIS WITH CONTRAST TECHNIQUE: Multidetector CT imaging of the abdomen and  pelvis was performed using the standard protocol following bolus administration of intravenous contrast. RADIATION DOSE REDUCTION: This exam was performed according to the departmental dose-optimization program which includes automated exposure control, adjustment of the mA and/or kV according to patient size and/or use of iterative reconstruction technique. CONTRAST:  OMNIPAQUE IOHEXOL 300 MG/ML  SOLN COMPARISON:  CT examination dated September 07, 2020. FINDINGS: Lower chest: No acute abnormality. Hepatobiliary: No focal liver abnormality is seen. No gallstones, gallbladder wall thickening, or biliary dilatation. Pancreas: Unremarkable. No pancreatic ductal dilatation or surrounding inflammatory changes. Spleen: Normal in size without focal abnormality. Adrenals/Urinary Tract: Adrenal glands are unremarkable. Kidneys are normal, without renal calculi, focal lesion, or hydronephrosis. Bladder is unremarkable. Stomach/Bowel: Postsurgical changes for prior sleeve gastrectomy. Appendix appears normal. There is marked thickening of the ascending and transverse colonic wall with mucosal enhancement and mild adjacent fat stranding suggesting colitis. Descending colon and sigmoid colon are unremarkable. Vascular/Lymphatic: No significant vascular findings are present. No enlarged abdominal or pelvic lymph nodes. Reproductive: Uterus and bilateral adnexa are unremarkable. Other: Small fat containing umbilical hernia. No abdominopelvic ascites. Musculoskeletal: Degenerate disc disease prominent at L5-S1. No acute osseous abnormality. IMPRESSION: 1. Marked  thickening of the ascending and transverse colonic wall with mucosal enhancement and mild adjacent fat stranding suggesting severe colitis, likely secondary to infectious etiology. 2.  Normal appendix. 3.  No evidence of nephrolithiasis or hydronephrosis. 4.  Uterus and adnexa are unremarkable. Electronically Signed   By: Larose Hires D.O.   On: 09/27/2021 21:13     Final Reassessment and Plan:   On repeat physical exam, patient symptoms have grossly resolved.  She is currently ambulatory and states that she is tolerating p.o. intake.  Overall patient is well-appearing has well-controlled symptoms.  Favor likely developing gastroenteritis picture given her diffuse syndrome.  No evidence of acute sepsis, diverticulitis, small bowel obstruction at this time and patient is stable for continued outpatient care and management.  Patient discharged with no further acute events.     Clinical Impression:  1. Enteritis      Discharge   Final Clinical Impression(s) / ED Diagnoses Final diagnoses:  Enteritis    Rx / DC Orders ED Discharge Orders          Ordered    ondansetron (ZOFRAN) 4 MG tablet  Every 6 hours        09/27/21 2126    famotidine (PEPCID) 20 MG tablet  2 times daily        09/27/21 2126              Glyn Ade, MD 09/27/21 2129

## 2021-10-12 ENCOUNTER — Other Ambulatory Visit (HOSPITAL_COMMUNITY): Payer: Self-pay

## 2021-10-28 ENCOUNTER — Ambulatory Visit: Payer: BLUE CROSS/BLUE SHIELD | Admitting: Nurse Practitioner

## 2021-11-06 ENCOUNTER — Encounter (HOSPITAL_COMMUNITY): Payer: Self-pay | Admitting: *Deleted

## 2021-11-26 ENCOUNTER — Other Ambulatory Visit: Payer: Self-pay

## 2021-11-26 ENCOUNTER — Emergency Department (HOSPITAL_COMMUNITY)
Admission: EM | Admit: 2021-11-26 | Discharge: 2021-11-26 | Disposition: A | Discharge status: Home / Self Care | Payer: BLUE CROSS/BLUE SHIELD | Attending: Emergency Medicine | Admitting: Emergency Medicine

## 2021-11-26 ENCOUNTER — Encounter (HOSPITAL_COMMUNITY): Payer: Self-pay

## 2021-11-26 DIAGNOSIS — Z20822 Contact with and (suspected) exposure to covid-19: Secondary | ICD-10-CM | POA: Insufficient documentation

## 2021-11-26 DIAGNOSIS — J069 Acute upper respiratory infection, unspecified: Secondary | ICD-10-CM | POA: Diagnosis not present

## 2021-11-26 DIAGNOSIS — R059 Cough, unspecified: Secondary | ICD-10-CM | POA: Diagnosis present

## 2021-11-26 DIAGNOSIS — R0981 Nasal congestion: Secondary | ICD-10-CM

## 2021-11-26 DIAGNOSIS — R051 Acute cough: Secondary | ICD-10-CM | POA: Insufficient documentation

## 2021-11-26 LAB — SARS CORONAVIRUS 2 BY RT PCR: SARS Coronavirus 2 by RT PCR: NEGATIVE

## 2021-11-26 NOTE — ED Triage Notes (Signed)
Patient said she thinks she has a respiratory infection. He has been coughing, sneezing, mucus. The mucus is yellow in color. Has been going on for 5 days.

## 2021-11-26 NOTE — ED Provider Notes (Signed)
[facility]-EMERGENCY DEPT Provider Note   CSN: [id number] Arrival date & time: 11/26/21  0900     History  Chief Complaint  Patient presents with   Nasal Congestion    Laura Benson is a 31 y.o. female with past medical history significant for severe obesity, depression, previous intra-abdominal surgery for gastric sleeve, obstructive sleep apnea who presents with 5 days of upper respiratory infectious symptoms.  Patient reports cough, nasal congestion, mucus, sneezing.  She reports that mucus is yellow in color, she is reported some throat irritation.  She denies any persistent chest pain, shortness of breath, fever, chills.  She reports a small amount of blood-tinged sputum with severe coughing episodes but no persistent hemoptysis.  She does not take any blood thinners.  She has no previous history of DVT or PE.  HPI     Home Medications Prior to Admission medications   Medication Sig Start Date End Date Taking? Authorizing Provider  albuterol (VENTOLIN HFA) 108 (90 Base) MCG/ACT inhaler Inhale 2 puffs into the lungs every 6 (six) hours as needed for wheezing or shortness of breath.    [provider]  amLODipine (NORVASC) 10 MG tablet Take 1 tablet (10 mg total) by mouth daily. 09/05/21   Starleen Blue, NP  ARIPiprazole (ABILIFY) 5 MG tablet Take 1 tablet (5 mg total) by mouth at bedtime. 09/04/21   Starleen Blue, NP  dicyclomine (BENTYL) 20 MG tablet Take 1 tablet (20 mg total) by mouth 2 (two) times daily. 09/25/21   Theron Arista, PA-C  DULoxetine (CYMBALTA) 60 MG capsule Take 1 capsule (60 mg total) by mouth daily. 09/05/21   Starleen Blue, NP  famotidine (PEPCID) 20 MG tablet Take 1 tablet (20 mg total) by mouth 2 (two) times daily. 09/27/21   Glyn Ade, MD  hydrOXYzine (ATARAX) 25 MG tablet Take 1 tablet (25 mg total) by mouth 3 (three) times daily as needed for anxiety. 09/04/21   Starleen Blue, NP  lidocaine (LIDODERM) 5 % Place 1 patch  onto the skin daily. Remove & Discard patch within 12 hours or as directed by MD 09/05/21   Starleen Blue, NP  lisinopril (ZESTRIL) 40 MG tablet Take 1 tablet (40 mg total) by mouth daily. 09/04/21   Starleen Blue, NP  melatonin 3 MG TABS tablet Take 1 tablet (3 mg total) by mouth at bedtime. 09/04/21   Starleen Blue, NP  norethindrone-ethinyl estradiol-FE (LOESTRIN FE) 1-20 MG-MCG tablet Take 1 tablet by mouth daily. 07/03/21   Sharlene Dory, DO  Vitamin D, Ergocalciferol, (DRISDOL) 1.25 MG (50000 UNIT) CAPS capsule Take 1 capsule (50,000 Units total) by mouth every 7 (seven) days. 09/09/21   Starleen Blue, NP      Allergies    Patient has no known allergies.    Review of Systems   Review of Systems  HENT:  Positive for congestion.   Respiratory:  Positive for cough.   All other systems reviewed and are negative.   Physical Exam Updated Vital Signs BP (!) 185/95   Pulse 95   Temp 98.3 F (36.8 C) (Oral)   Resp 15   Ht 5\' 6"  (1.676 m)   Wt (!) 179.2 kg   SpO2 98%   BMI 63.75 kg/m  Physical Exam Vitals and nursing note reviewed.  Constitutional:      General: She is not in acute distress.    Appearance: Normal appearance. She is obese.  HENT:     Head: Normocephalic and atraumatic.  Eyes:  General:        Right eye: No discharge.        Left eye: No discharge.  Cardiovascular:     Rate and Rhythm: Normal rate and regular rhythm.     Heart sounds: No murmur heard.    No friction rub. No gallop.  Pulmonary:     Effort: Pulmonary effort is normal.     Breath sounds: Normal breath sounds.     Comments: Good respiratory movement bilaterally, no wheezing, , stridor, rales.  Small amount of congestion, scattered rhonchi that clears with cough. Abdominal:     General: Bowel sounds are normal.     Palpations: Abdomen is soft.  Skin:    General: Skin is warm and dry.     Capillary Refill: Capillary refill takes less than 2 seconds.  Neurological:     Mental  Status: She is alert and oriented to person, place, and time.  Psychiatric:        Mood and Affect: Mood normal.        Behavior: Behavior normal.     ED Results / Procedures / Treatments   Labs (all labs ordered are listed, but only abnormal results are displayed) Labs Reviewed  SARS CORONAVIRUS 2 BY RT PCR    EKG None  Radiology No results found.  Procedures Procedures    Medications Ordered in ED Medications - No data to display  ED Course/ Medical Decision Making/ A&P                           Medical Decision Making  This is a overall well-appearing 31 year old female, although she has some chronic medical conditions including morbid obesity her presentation today is fairly benign, she is fairly hypertensive on arrival with blood pressure 185/95, however she denies any chest pain, shortness of breath, she does have a history of hypertension previously.  Her symptoms today are consistent with upper respiratory infection, nasal congestion, she reports symptoms of been present for around 5 days.  She does not have significant wheezing on my exam.  Her symptoms are consistent with viral upper respiratory infection, she is afebrile Today, and appears to only require supportive care.  I have low clinical suspicion for developing acute bronchitis or pneumonia.  We will swab her for COVID, flu, encouraged over-the-counter cough and cold medication, rest, plenty of fluids, and will provide work note.  Patient understands agrees to plan, and is discharged in stable condition at this time. Final Clinical Impression(s) / ED Diagnoses Final diagnoses:  Viral upper respiratory tract infection  Nasal congestion  Acute cough    Rx / DC Orders ED Discharge Orders     None         Anselmo Pickler, PA-C 11/26/21 6712    Margette Fast, MD 11/26/21 1324

## 2021-12-04 ENCOUNTER — Encounter: Payer: Self-pay | Admitting: Family Medicine

## 2021-12-04 ENCOUNTER — Ambulatory Visit (INDEPENDENT_AMBULATORY_CARE_PROVIDER_SITE_OTHER): Payer: BLUE CROSS/BLUE SHIELD | Admitting: Family Medicine

## 2021-12-04 VITALS — BP 130/80 | HR 76 | Temp 99.0°F | Ht 66.0 in | Wt 393.5 lb

## 2021-12-04 DIAGNOSIS — K429 Umbilical hernia without obstruction or gangrene: Secondary | ICD-10-CM | POA: Diagnosis not present

## 2021-12-04 DIAGNOSIS — J4521 Mild intermittent asthma with (acute) exacerbation: Secondary | ICD-10-CM | POA: Diagnosis not present

## 2021-12-04 DIAGNOSIS — J189 Pneumonia, unspecified organism: Secondary | ICD-10-CM | POA: Diagnosis not present

## 2021-12-04 MED ORDER — AZITHROMYCIN 250 MG PO TABS: ORAL_TABLET | ORAL | 0 refills | Status: DC

## 2021-12-04 MED ORDER — PREDNISONE 20 MG PO TABS: 40.0000 mg | ORAL_TABLET | Freq: Every day | ORAL | 0 refills | Status: DC

## 2021-12-04 NOTE — Patient Instructions (Addendum)
Continue to push fluids, practice good hand hygiene, and cover your mouth if you cough.  If you start having fevers, shaking or shortness of breath, seek immediate care.  OK to take Tylenol 1000 mg (2 extra strength tabs) or 975 mg (3 regular strength tabs) every 6 hours as needed.  Call the surgery team regarding your painful umbilical hernia.   Call Center for [facility] at [facility] at [phone] for an appointment.  They are located at [address], [address], [place], [place], [address] (right across the hall from our office).  Let us know if you need anything.

## 2021-12-04 NOTE — Progress Notes (Signed)
Chief Complaint  Patient presents with   Follow-up   Cough    Laura Benson here for URI complaints.  Duration: 2 weeks  Associated symptoms: sinus headache, sinus congestion, rhinorrhea, wheezing, shortness of breath, chest tightness, and productive cough, coughing up blood mixed w sputum Denies: sinus pain, itchy watery eyes, ear pain, ear drainage, sore throat, myalgia, and fevers Hx of asthma.  Treatment to date: Vick's, Tylenol, Mucinex Sick contacts: Yes; family members  The patient has a history of an umbilical hernia.  This is been going on for several years, she was referred to the general surgery team for this back in 2019.  She has had bariatric surgery since then with the [facility].  She was told she is able to call them to have this evaluated.  It is painful when she lifts heavier objects and is requesting an accommodation for work.  Past Medical History:  Diagnosis Date   Anxiety    Asthma    Complication of anesthesia    Take for a while to wake up   Depression    Diabetes mellitus without complication (HCC)    Hypertension    Morbid obesity (HCC)    Sleep apnea    CPAP   Tachycardia     Objective BP 130/80 (BP Location: Left Arm, Patient Position: Sitting, Cuff Size: Large)   Pulse 76   Temp 99 F (37.2 C) (Oral)   Ht 5\' 6"  (1.676 m)   Wt (!) 393 lb 8 oz (178.5 kg)   SpO2 96%   BMI 63.51 kg/m  General: Awake, alert, appears stated age HEENT: AT, [place], ears patent b/l and TM's neg, nares patent w/o discharge, pharynx pink and without exudates, MMM Neck: No masses or asymmetry Heart: RRR Lungs: CTAB, no accessory muscle use Psych: Age appropriate judgment and insight, normal mood and affect  Mild intermittent asthma with acute exacerbation - Plan: predniSONE (DELTASONE) 20 MG tablet  Walking pneumonia - Plan: azithromycin (ZITHROMAX) 250 MG tablet  Umbilical hernia without obstruction and without gangrene  Exacerbation of  chronic issue.  5-day prednisone burst, 40 mg daily.  Continue albuterol as needed. With duration and worsening of symptoms, will cover for walking pneumonia/bacterial bronchitis with a Z-Pak. I did write her a letter allowing a 20 pound lifting restriction at work.  We will defer to the general surgery team to repair the umbilical hernia but she was told to reach out and let know if she needs a new referral. Pt voiced understanding and agreement to the plan.  [place], DO 12/04/21 12:07 PM

## 2021-12-05 ENCOUNTER — Telehealth: Payer: Self-pay | Admitting: Family Medicine

## 2021-12-05 DIAGNOSIS — J4521 Mild intermittent asthma with (acute) exacerbation: Secondary | ICD-10-CM

## 2021-12-05 DIAGNOSIS — J189 Pneumonia, unspecified organism: Secondary | ICD-10-CM

## 2021-12-05 MED ORDER — PREDNISONE 20 MG PO TABS: 40.0000 mg | ORAL_TABLET | Freq: Every day | ORAL | 0 refills | Status: AC

## 2021-12-05 MED ORDER — AZITHROMYCIN 250 MG PO TABS: ORAL_TABLET | ORAL | 0 refills | Status: DC

## 2021-12-05 NOTE — Telephone Encounter (Signed)
Rx sent 

## 2021-12-05 NOTE — Telephone Encounter (Signed)
Pt states pharmacy's power went out so they did not receive rx, and she would like it resent. If pcp is not able to fill, can DOD fill?  azithromycin (ZITHROMAX) 250 MG Tablet    predniSONE (DELTASONE) 20 MG tablet   [id number] [place], [place] - [address] [place], [place] [address] Phone: [phone]  Fax: [phone]

## 2021-12-09 ENCOUNTER — Ambulatory Visit: Payer: BLUE CROSS/BLUE SHIELD | Admitting: Family Medicine

## 2022-01-14 ENCOUNTER — Encounter: Payer: Self-pay | Admitting: Family Medicine

## 2022-01-14 ENCOUNTER — Other Ambulatory Visit (HOSPITAL_COMMUNITY): Payer: Self-pay

## 2022-01-14 MED ORDER — ARIPIPRAZOLE 5 MG PO TABS
5.0000 mg | ORAL_TABLET | Freq: Every day | ORAL | 3 refills | Status: DC
  Filled 2022-01-14: qty 30, 30d supply, fill #0

## 2022-01-14 MED ORDER — HYDROXYZINE HCL 25 MG PO TABS
25.0000 mg | ORAL_TABLET | Freq: Three times a day (TID) | ORAL | 3 refills | Status: DC | PRN
  Filled 2022-01-14: qty 30, 10d supply, fill #0

## 2022-01-14 MED ORDER — LISINOPRIL 40 MG PO TABS
40.0000 mg | ORAL_TABLET | Freq: Every day | ORAL | 3 refills | Status: DC
  Filled 2022-01-14: qty 30, 30d supply, fill #0

## 2022-01-14 MED ORDER — DULOXETINE HCL 60 MG PO CPEP
60.0000 mg | ORAL_CAPSULE | Freq: Every day | ORAL | 3 refills | Status: DC
  Filled 2022-01-14: qty 30, 30d supply, fill #0

## 2022-01-14 NOTE — Addendum Note (Signed)
Addended by: Scharlene Gloss B on: 01/14/2022 05:08 PM   Modules accepted: Orders

## 2022-01-15 ENCOUNTER — Other Ambulatory Visit (HOSPITAL_COMMUNITY): Payer: Self-pay

## 2022-01-16 ENCOUNTER — Other Ambulatory Visit (HOSPITAL_COMMUNITY): Payer: Self-pay

## 2022-01-18 ENCOUNTER — Emergency Department (HOSPITAL_COMMUNITY)
Admission: EM | Admit: 2022-01-18 | Discharge: 2022-01-18 | Disposition: A | Discharge status: Home / Self Care | Payer: BLUE CROSS/BLUE SHIELD | Attending: Emergency Medicine | Admitting: Emergency Medicine

## 2022-01-18 ENCOUNTER — Other Ambulatory Visit: Payer: Self-pay

## 2022-01-18 ENCOUNTER — Emergency Department (HOSPITAL_COMMUNITY): Payer: BLUE CROSS/BLUE SHIELD

## 2022-01-18 DIAGNOSIS — J069 Acute upper respiratory infection, unspecified: Secondary | ICD-10-CM | POA: Insufficient documentation

## 2022-01-18 DIAGNOSIS — J40 Bronchitis, not specified as acute or chronic: Secondary | ICD-10-CM | POA: Diagnosis not present

## 2022-01-18 DIAGNOSIS — R0981 Nasal congestion: Secondary | ICD-10-CM | POA: Diagnosis present

## 2022-01-18 DIAGNOSIS — Z1152 Encounter for screening for COVID-19: Secondary | ICD-10-CM | POA: Insufficient documentation

## 2022-01-18 LAB — RESP PANEL BY RT-PCR (RSV, FLU A&B, COVID)  RVPGX2
Influenza A by PCR: NEGATIVE
Influenza B by PCR: NEGATIVE
Resp Syncytial Virus by PCR: NEGATIVE
SARS Coronavirus 2 by RT PCR: NEGATIVE

## 2022-01-18 MED ORDER — ACETAMINOPHEN 325 MG PO TABS
650.0000 mg | ORAL_TABLET | Freq: Once | ORAL | Status: AC | PRN
  Administered 2022-01-18: 650 mg via ORAL
  Filled 2022-01-18: qty 2

## 2022-01-18 MED ORDER — AZITHROMYCIN 250 MG PO TABS: 250.0000 mg | ORAL_TABLET | Freq: Every day | ORAL | 0 refills | Status: DC

## 2022-01-18 NOTE — ED Provider Notes (Signed)
[facility]-EMERGENCY DEPT Provider Note   CSN: [id number] Arrival date & time: 01/18/22  0277     History  Chief Complaint  Patient presents with   URI    Laura Benson is a 31 y.o. female.  She is here with 2 days of cough productive of clear sputum, fevers, body aches, sneezing and nasal congestion.  She thinks she might have the flu.  She does have a history of breathing problems.  Non-smoker, uses marijuana.  No vomiting diarrhea or urinary symptoms.  The history is provided by the patient.  URI Presenting symptoms: congestion, cough, fatigue, fever and rhinorrhea   Cough:    Cough characteristics:  Productive   Sputum characteristics:  Nondescript   Severity:  Moderate   Onset quality:  Gradual   Duration:  2 days   Timing:  Intermittent   Progression:  Unchanged   Chronicity:  New Associated symptoms: myalgias   Risk factors: diabetes mellitus        Home Medications Prior to Admission medications   Medication Sig Start Date End Date Taking? Authorizing Provider  albuterol (VENTOLIN HFA) 108 (90 Base) MCG/ACT inhaler Inhale 2 puffs into the lungs every 6 (six) hours as needed for wheezing or shortness of breath.    [provider]  amLODipine (NORVASC) 10 MG tablet Take 1 tablet (10 mg total) by mouth daily. 09/05/21   Starleen Blue, NP  ARIPiprazole (ABILIFY) 5 MG tablet Take 1 tablet (5 mg total) by mouth at bedtime. 01/14/22   Sharlene Dory, DO  azithromycin (ZITHROMAX) 250 MG tablet Take 2 tabs the first day and then 1 tab daily until you run out. 12/05/21   Sharlene Dory, DO  dicyclomine (BENTYL) 20 MG tablet Take 1 tablet (20 mg total) by mouth 2 (two) times daily. 09/25/21   Theron Arista, PA-C  DULoxetine (CYMBALTA) 60 MG capsule Take 1 capsule (60 mg total) by mouth daily. 01/14/22   Sharlene Dory, DO  famotidine (PEPCID) 20 MG tablet Take 1 tablet (20 mg total) by mouth 2 (two) times daily. 09/27/21    Glyn Ade, MD  hydrOXYzine (ATARAX) 25 MG tablet Take 1 tablet (25 mg total) by mouth 3 (three) times daily as needed for anxiety. 01/14/22   Sharlene Dory, DO  lidocaine (LIDODERM) 5 % Place 1 patch onto the skin daily. Remove & Discard patch within 12 hours or as directed by MD 09/05/21   Starleen Blue, NP  lisinopril (ZESTRIL) 40 MG tablet Take 1 tablet (40 mg total) by mouth daily. 01/14/22   Sharlene Dory, DO  melatonin 3 MG TABS tablet Take 1 tablet (3 mg total) by mouth at bedtime. 09/04/21   Starleen Blue, NP  norethindrone-ethinyl estradiol-FE (LOESTRIN FE) 1-20 MG-MCG tablet Take 1 tablet by mouth daily. 07/03/21   Sharlene Dory, DO  Vitamin D, Ergocalciferol, (DRISDOL) 1.25 MG (50000 UNIT) CAPS capsule Take 1 capsule (50,000 Units total) by mouth every 7 (seven) days. 09/09/21   Starleen Blue, NP      Allergies    Patient has no known allergies.    Review of Systems   Review of Systems  Constitutional:  Positive for fatigue and fever.  HENT:  Positive for congestion and rhinorrhea.   Eyes:  Negative for visual disturbance.  Respiratory:  Positive for cough.   Cardiovascular:  Negative for chest pain.  Gastrointestinal:  Negative for diarrhea, nausea and vomiting.  Genitourinary:  Negative for dysuria.  Musculoskeletal:  Positive for myalgias.    Physical Exam Updated Vital Signs BP (!) 174/102 (BP Location: Left Arm)   Pulse 85   Temp (!) 102.9 F (39.4 C) (Oral)   Resp 19   Ht 5\' 6"  (1.676 m)   Wt (!) 179.6 kg   LMP 01/04/2022   SpO2 97%   BMI 63.92 kg/m  Physical Exam Vitals and nursing note reviewed.  Constitutional:      General: She is not in acute distress.    Appearance: Normal appearance. She is well-developed.  HENT:     Head: Normocephalic and atraumatic.  Eyes:     Conjunctiva/sclera: Conjunctivae normal.  Cardiovascular:     Rate and Rhythm: Normal rate and regular rhythm.     Heart sounds: No murmur  heard. Pulmonary:     Effort: Pulmonary effort is normal. No respiratory distress.     Breath sounds: Normal breath sounds.  Abdominal:     Palpations: Abdomen is soft.     Tenderness: There is no abdominal tenderness.  Musculoskeletal:        General: No swelling.     Cervical back: Neck supple.  Skin:    General: Skin is warm and dry.     Capillary Refill: Capillary refill takes less than 2 seconds.  Neurological:     General: No focal deficit present.     Mental Status: She is alert.     ED Results / Procedures / Treatments   Labs (all labs ordered are listed, but only abnormal results are displayed) Labs Reviewed  RESP PANEL BY RT-PCR (RSV, FLU A&B, COVID)  [id number]    EKG None  Radiology DG Chest 2 View  Result Date: 01/18/2022 CLINICAL DATA:  Cough, fever EXAM: CHEST - 2 VIEW COMPARISON:  Radiograph 08/07/2021 FINDINGS: The cardiomediastinal silhouette is within normal limits. There is no focal airspace consolidation. There is no pleural effusion or evidence of pneumothorax. There is no acute osseous abnormality. IMPRESSION: No evidence of acute cardiopulmonary disease. Electronically Signed   By: 10/08/2021 M.D.   On: 01/18/2022 09:27    Procedures Procedures    Medications Ordered in ED Medications  acetaminophen (TYLENOL) tablet 650 mg (650 mg Oral Given 01/18/22 01/20/22)    ED Course/ Medical Decision Making/ A&P                           Medical Decision Making Amount and/or Complexity of Data Reviewed Radiology: ordered.  Risk Prescription drug management.   This patient complains of flulike symptoms; this involves an extensive number of treatment Options and is a complaint that carries with it a high risk of complications and morbidity. The differential includes flu, COVID, pneumonia, bronchitis, dehydration, viral syndrome  I ordered, reviewed and interpreted labs, which included COVID and flu negative I ordered medication Tylenol for fever and  reviewed PMP when indicated. I ordered imaging studies which included chest x-ray and I independently    visualized and interpreted imaging which showed no acute infiltrates Previous records obtained and reviewed in epic epic, no recent admissions Social determinants considered, patient with history of depression Critical Interventions: None  After the interventions stated above, I reevaluated the patient and found patient's fever to be improved and hemodynamically stable oxygenating well on room air Admission and further testing considered, no indications for admission or further workup at this time.  Will cover with antibiotics for bronchitis and recommended close follow-up with PCP and blood sugar  monitoring.  Return instructions discussed         Final Clinical Impression(s) / ED Diagnoses Final diagnoses:  Bronchitis  Upper respiratory tract infection, unspecified type    Rx / DC Orders ED Discharge Orders          Ordered    azithromycin (ZITHROMAX) 250 MG tablet  Daily        01/18/22 1138              Terrilee Files, MD 01/18/22 [id number]

## 2022-01-18 NOTE — ED Triage Notes (Signed)
Pt BIB EMS from home c/o body aches, fever and cough since yesterday.

## 2022-01-18 NOTE — Discharge Instructions (Addendum)
You were seen in the emergency department for cough fever body aches.  Your COVID and flu test were negative and your chest x-ray did not show any pneumonia.  We are treating you with antibiotics.  Please use Tylenol and ibuprofen for fever and aches, drink plenty of fluids and rest.  Follow-up with your regular doctor.  Return to the emergency department if any worsening or concerning symptoms.

## 2022-02-02 ENCOUNTER — Other Ambulatory Visit: Payer: Self-pay | Admitting: Family Medicine

## 2022-02-03 ENCOUNTER — Other Ambulatory Visit (HOSPITAL_COMMUNITY): Payer: Self-pay

## 2022-02-03 MED ORDER — ALBUTEROL SULFATE HFA 108 (90 BASE) MCG/ACT IN AERS
2.0000 | INHALATION_SPRAY | Freq: Four times a day (QID) | RESPIRATORY_TRACT | 0 refills | Status: DC | PRN
  Filled 2022-02-03: qty 6.7, 25d supply, fill #0

## 2022-02-13 ENCOUNTER — Other Ambulatory Visit (HOSPITAL_COMMUNITY): Payer: Self-pay

## 2022-04-11 ENCOUNTER — Other Ambulatory Visit (HOSPITAL_COMMUNITY): Payer: Self-pay

## 2022-04-11 ENCOUNTER — Ambulatory Visit (HOSPITAL_COMMUNITY)
Admission: EM | Admit: 2022-04-11 | Discharge: 2022-04-11 | Disposition: A | Discharge status: Home / Self Care | Payer: BLUE CROSS/BLUE SHIELD | Attending: Nurse Practitioner | Admitting: Nurse Practitioner

## 2022-04-11 ENCOUNTER — Encounter (HOSPITAL_COMMUNITY): Payer: Self-pay | Admitting: Emergency Medicine

## 2022-04-11 DIAGNOSIS — M5441 Lumbago with sciatica, right side: Secondary | ICD-10-CM | POA: Diagnosis not present

## 2022-04-11 DIAGNOSIS — R519 Headache, unspecified: Secondary | ICD-10-CM

## 2022-04-11 MED ORDER — TIZANIDINE HCL 4 MG PO TABS
4.0000 mg | ORAL_TABLET | Freq: Three times a day (TID) | ORAL | 0 refills | Status: DC | PRN
  Filled 2022-04-11: qty 30, 10d supply, fill #0

## 2022-04-11 NOTE — ED Provider Notes (Signed)
[place]    CSN: [id number] Arrival date & time: 04/11/22  1127      History   Chief Complaint Chief Complaint  Patient presents with   Headache   Back Pain    HPI Laura Benson is a 32 y.o. female.   Patient presents today for acute midline low back pain for the past week.  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  No recent fall, accident, injury, or known trauma to the back.  Reports last week when she was at work, she was picking things up and felt dizzy.  Reports shortly thereafter, she began having back pain as well as a headache.  Reports she has not been taking her blood pressure medication as prescribed and has not taken anything for the headache or back pain so far.  She does endorse a history of sciatic nerve inflammation and reports it feels similar.  She endorses pain shooting down her right leg.  No numbness or tingling in her legs.  No new urinary incontinence or bowel incontinence, saddle anesthesia, weakness in lower extremities, decreased sensation lower extremities, dysuria, urinary frequency, or hematuria.  No fever or nausea/vomiting since symptoms started.    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   Encounter for gynecological examination without abnormal finding 02/03/2020   Lumbar radiculopathy 08/17/2019   Type 2 diabetes mellitus with hyperglycemia, without long-term current use of insulin ([place])  01/18/2019   Anxiety 12/31/2018   Asthma 06/08/2018   Tachycardia    MDD (major depressive disorder), severe ([place]) 05/05/2017   Alcohol abuse [id number]   Umbilical hernia without obstruction and without gangrene 02/25/2017   Obstructive sleep apnea 12/11/2016   Diabetes mellitus without complication ([place]) [id number]   Essential hypertension 01/23/2016   Dysmenorrhea 12/25/2013   Rectal bleeding 10/20/2012   Depression, recurrent ([place]) 01/05/2009   BREAST PAIN, BILATERAL 01/05/2009   INTERTRIGO, CANDIDAL 01/05/2009   Amenorrhea 09/20/2008   DELAYED MENSES 08/25/2008   ASCUS PAP 05/05/2008   Severe obesity (BMI >= 40) ([place]) 03/26/2006   HYPERTENSION, BENIGN SYSTEMIC 03/26/2006    Past Surgical History:  Procedure Laterality Date   ADENOIDECTOMY     LAPAROSCOPIC GASTRIC SLEEVE RESECTION N/A 04/16/2020   Procedure: LAPAROSCOPIC GASTRIC SLEEVE RESECTION;  Surgeon: Greer Pickerel, MD;  Location: [facility];  Service: General;  Laterality: N/A;   TONSILLECTOMY     UPPER GI ENDOSCOPY N/A 04/16/2020   Procedure: UPPER GI ENDOSCOPY;  Surgeon: Greer Pickerel, MD;  Location: [facility];  Service: General;  Laterality: N/A;    OB History     Gravida  0   Para      Term      Preterm      AB      Living         SAB      IAB  Ectopic      Multiple      Live Births               Home Medications    Prior to Admission medications   Medication Sig Start Date End Date Taking? Authorizing Provider  tiZANidine (ZANAFLEX) 4 MG tablet Take 1 tablet (4 mg total) by mouth every 8 (eight) hours as needed for muscle spasms. Do not take with alcohol or while driving or operating heavy machinery.  May cause drowsiness. 04/11/22  Yes Eulogio Bear, NP  albuterol (VENTOLIN HFA) 108 (90 Base) MCG/ACT inhaler Inhale 2 puffs into the lungs every 6 (six) hours as needed for wheezing or shortness of breath. 02/03/22   Shelda Pal, DO  amLODipine (NORVASC) 10 MG tablet Take 1  tablet (10 mg total) by mouth daily. 09/05/21   Nicholes Rough, NP  ARIPiprazole (ABILIFY) 5 MG tablet Take 1 tablet (5 mg total) by mouth at bedtime. 01/14/22   Shelda Pal, DO  dicyclomine (BENTYL) 20 MG tablet Take 1 tablet (20 mg total) by mouth 2 (two) times daily. 09/25/21   Sherrill Raring, PA-C  DULoxetine (CYMBALTA) 60 MG capsule Take 1 capsule (60 mg total) by mouth daily. 01/14/22   Shelda Pal, DO  famotidine (PEPCID) 20 MG tablet Take 1 tablet (20 mg total) by mouth 2 (two) times daily. 09/27/21   Tretha Sciara, MD  hydrOXYzine (ATARAX) 25 MG tablet Take 1 tablet (25 mg total) by mouth 3 (three) times daily as needed for anxiety. 01/14/22   Shelda Pal, DO  lidocaine (LIDODERM) 5 % Place 1 patch onto the skin daily. Remove & Discard patch within 12 hours or as directed by MD 09/05/21   Nicholes Rough, NP  lisinopril (ZESTRIL) 40 MG tablet Take 1 tablet (40 mg total) by mouth daily. 01/14/22   Shelda Pal, DO  melatonin 3 MG TABS tablet Take 1 tablet (3 mg total) by mouth at bedtime. 09/04/21   Nicholes Rough, NP  norethindrone-ethinyl estradiol-FE (LOESTRIN FE) 1-20 MG-MCG tablet Take 1 tablet by mouth daily. 07/03/21   Shelda Pal, DO  Vitamin D, Ergocalciferol, (DRISDOL) 1.25 MG (50000 UNIT) CAPS capsule Take 1 capsule (50,000 Units total) by mouth every 7 (seven) days. 09/09/21   Nicholes Rough, NP    Family History Family History  Problem Relation Age of Onset   Hypertension Mother    Diabetes Mother    Colon cancer Father    Hypertension Maternal Grandmother    Hypertension Paternal Grandmother    Diabetes Paternal Grandmother    Breast cancer Cousin     Social History Social History   Tobacco Use   Smoking status: Former    Years: 2    Types: Cigarettes   Smokeless tobacco: Never  Scientific laboratory technician Use: Never used  Substance Use Topics   Alcohol use: No   Drug use: No     Allergies   Patient has no known  allergies.   Review of Systems Review of Systems Per HPI  Physical Exam Triage Vital Signs ED Triage Vitals  Enc Vitals Group     BP 04/11/22 1318 (!) 153/93     Pulse Rate 04/11/22 1318 63     Resp 04/11/22 1318 18     Temp 04/11/22 1318 98.5 F (36.9 C)     Temp src --      SpO2 04/11/22 1318 98 %     Weight --  Height --      Head Circumference --      Peak Flow --      Pain Score 04/11/22 1316 6     Pain Loc --      Pain Edu? --      Excl. in [place]? --    No data found.  Updated Vital Signs BP (!) 153/93 (BP Location: Right Arm)   Pulse 63   Temp 98.5 F (36.9 C)   Resp 18   LMP 03/18/2022   SpO2 98%   Visual Acuity Right Eye Distance:   Left Eye Distance:   Bilateral Distance:    Right Eye Near:   Left Eye Near:    Bilateral Near:     Physical Exam Vitals and nursing note reviewed.  Constitutional:      General: She is not in acute distress.    Appearance: Normal appearance. She is not toxic-appearing.  HENT:     Mouth/Throat:     Mouth: Mucous membranes are moist.     Pharynx: Oropharynx is clear.  Pulmonary:     Effort: Pulmonary effort is normal. No respiratory distress.  Musculoskeletal:       Back:     Comments: Inspection: no swelling, obvious deformity or redness to lumbar spine Palpation: Lumbar spine tender to palpation area marked; no obvious deformities palpated; paraspinal muscles are also tender to touch ROM: Full ROM of spine Strength: 5/5 bilateral lower extremities Neurovascular: neurovascularly intact in left and right lower extremity   Skin:    General: Skin is warm and dry.     Capillary Refill: Capillary refill takes less than 2 seconds.     Coloration: Skin is not jaundiced or pale.     Findings: No erythema.  Neurological:     Mental Status: She is alert and oriented to person, place, and time.  Psychiatric:        Behavior: Behavior is cooperative.      UC Treatments / Results  Labs (all labs ordered are  listed, but only abnormal results are displayed) Labs Reviewed - No data to display  EKG   Radiology No results found.  Procedures Procedures (including critical care time)  Medications Ordered in UC Medications - No data to display  Initial Impression / Assessment and Plan / UC Course  I have reviewed the triage vital signs and the nursing notes.  Pertinent labs & imaging results that were available during my care of the patient were reviewed by me and considered in my medical decision making (see chart for details).   Patient is well-appearing, normotensive, afebrile, not tachycardic, not tachypneic, oxygenating well on room air.    1. Acute nonintractable headache, unspecified headache type Suspect secondary to hypertension and not taking medication No red flags in history or on exam today Encourage patient to take her medication Follow-up with primary care provider if headache persist despite treatment  2. Acute midline low back pain with right-sided sciatica No red flags; vital signs and examination today are reassuring Start Tylenol, stretches/exercises Can also start tizanidine as needed every 8 hours for muscular pain Recommended follow-up with primary care provider with persistent or worsening symptoms despite treatment  The patient was given the opportunity to ask questions.  All questions answered to their satisfaction.  The patient is in agreement to this plan.    Final Clinical Impressions(s) / UC Diagnoses   Final diagnoses:  Acute nonintractable headache, unspecified headache type  Acute midline low back pain  with right-sided sciatica     Discharge Instructions      As we discussed, it sounds like you may have strained your low back You can take Tylenol 500 to 1000 mg every 6 hours as needed for pain If your pain persist, you can also take the tizanidine every 8 hours as needed muscular pain. Follow-up with your primary care provider next week for  reevaluation of your blood pressure once you start back on your blood pressure medication If your headache persists, follow-up with your primary care provider as well   ED Prescriptions     Medication Sig Dispense Auth. Provider   tiZANidine (ZANAFLEX) 4 MG tablet Take 1 tablet (4 mg total) by mouth every 8 (eight) hours as needed for muscle spasms. Do not take with alcohol or while driving or operating heavy machinery.  May cause drowsiness. 30 tablet Eulogio Bear, NP      PDMP not reviewed this encounter.   Eulogio Bear, NP 04/11/22 1630

## 2022-04-11 NOTE — Discharge Instructions (Signed)
As we discussed, it sounds like you may have strained your low back You can take Tylenol 500 to 1000 mg every 6 hours as needed for pain If your pain persist, you can also take the tizanidine every 8 hours as needed muscular pain. Follow-up with your primary care provider next week for reevaluation of your blood pressure once you start back on your blood pressure medication If your headache persists, follow-up with your primary care provider as well

## 2022-04-11 NOTE — ED Triage Notes (Signed)
Pt reports was at work last Friday picking up things when felt dizzy and near syncope. Reports back and head started hurting and still continued on. Denies taking medications for pain.

## 2022-04-24 ENCOUNTER — Other Ambulatory Visit (HOSPITAL_COMMUNITY): Payer: Self-pay

## 2022-05-02 ENCOUNTER — Encounter: Payer: BLUE CROSS/BLUE SHIELD | Admitting: Family Medicine

## 2022-07-09 ENCOUNTER — Other Ambulatory Visit (HOSPITAL_COMMUNITY): Payer: Self-pay

## 2022-07-09 MED ORDER — ARIPIPRAZOLE 5 MG PO TABS
5.0000 mg | ORAL_TABLET | Freq: Every day | ORAL | 2 refills | Status: DC
  Filled 2022-07-09: qty 30, 30d supply, fill #0

## 2022-07-09 MED ORDER — HYDROXYZINE HCL 25 MG PO TABS
25.0000 mg | ORAL_TABLET | Freq: Every day | ORAL | 2 refills | Status: DC | PRN
  Filled 2022-07-09: qty 30, 30d supply, fill #0

## 2022-07-09 MED ORDER — DULOXETINE HCL 60 MG PO CPEP
60.0000 mg | ORAL_CAPSULE | Freq: Every day | ORAL | 2 refills | Status: DC
  Filled 2022-07-09: qty 30, 30d supply, fill #0

## 2022-07-17 ENCOUNTER — Other Ambulatory Visit (HOSPITAL_COMMUNITY): Payer: Self-pay

## 2022-08-07 ENCOUNTER — Other Ambulatory Visit (HOSPITAL_COMMUNITY): Payer: Self-pay

## 2022-08-07 MED ORDER — ARIPIPRAZOLE 10 MG PO TABS
ORAL_TABLET | ORAL | 2 refills | Status: DC
  Filled 2022-08-07: qty 30, 30d supply, fill #0

## 2022-08-07 MED ORDER — HYDROXYZINE HCL 25 MG PO TABS
25.0000 mg | ORAL_TABLET | Freq: Three times a day (TID) | ORAL | 2 refills | Status: DC | PRN
  Filled 2022-08-07: qty 90, 30d supply, fill #0

## 2022-08-07 MED ORDER — TRAZODONE HCL 100 MG PO TABS
ORAL_TABLET | ORAL | 2 refills | Status: DC
  Filled 2022-08-07: qty 30, 30d supply, fill #0

## 2022-08-07 MED ORDER — DULOXETINE HCL 60 MG PO CPEP
60.0000 mg | ORAL_CAPSULE | Freq: Every day | ORAL | 2 refills | Status: DC
  Filled 2022-08-07: qty 30, 30d supply, fill #0

## 2022-08-18 ENCOUNTER — Other Ambulatory Visit (HOSPITAL_COMMUNITY): Payer: Self-pay

## 2022-08-28 IMAGING — CR DG ABDOMEN ACUTE W/ 1V CHEST
5 series · 5 of 5 positions shown · non-contrast
Comparison: 09/07/2020 CT abdomen/pelvis. 04/18/2020 chest
radiograph.

CLINICAL DATA: Umbilical hernia and pain

EXAM:
DG ABDOMEN ACUTE WITH 1 VIEW CHEST

[x chest ap]
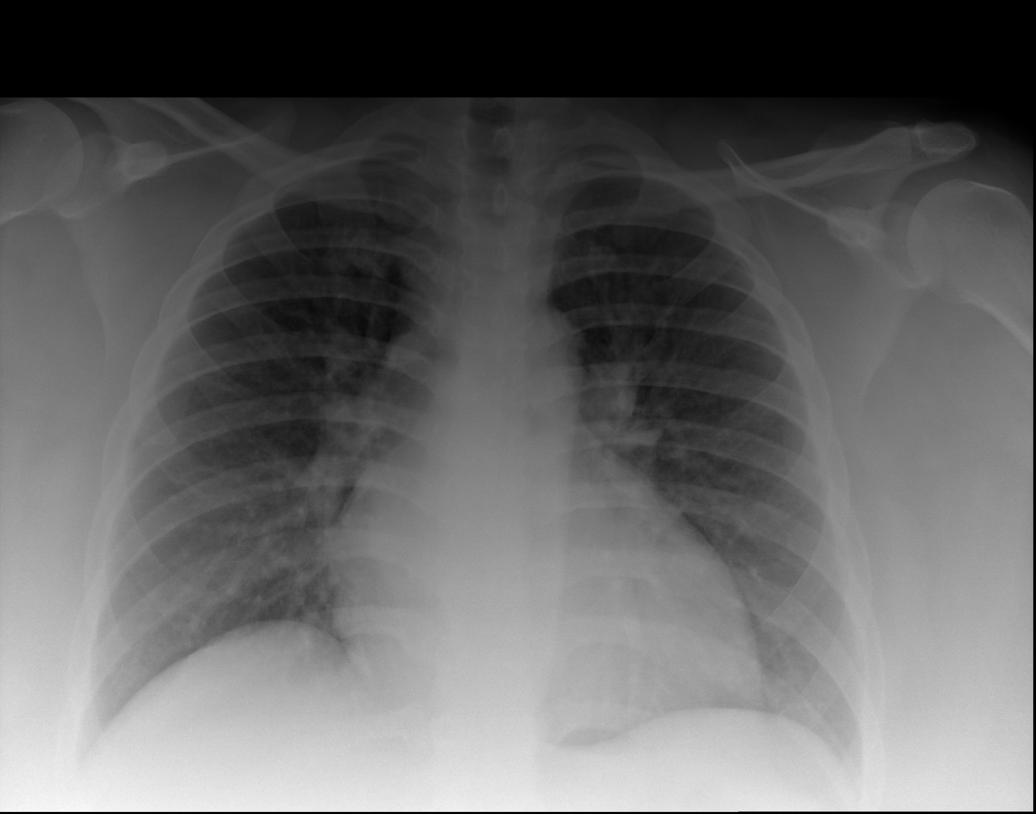

[x abdomen supine (1 of 4)]
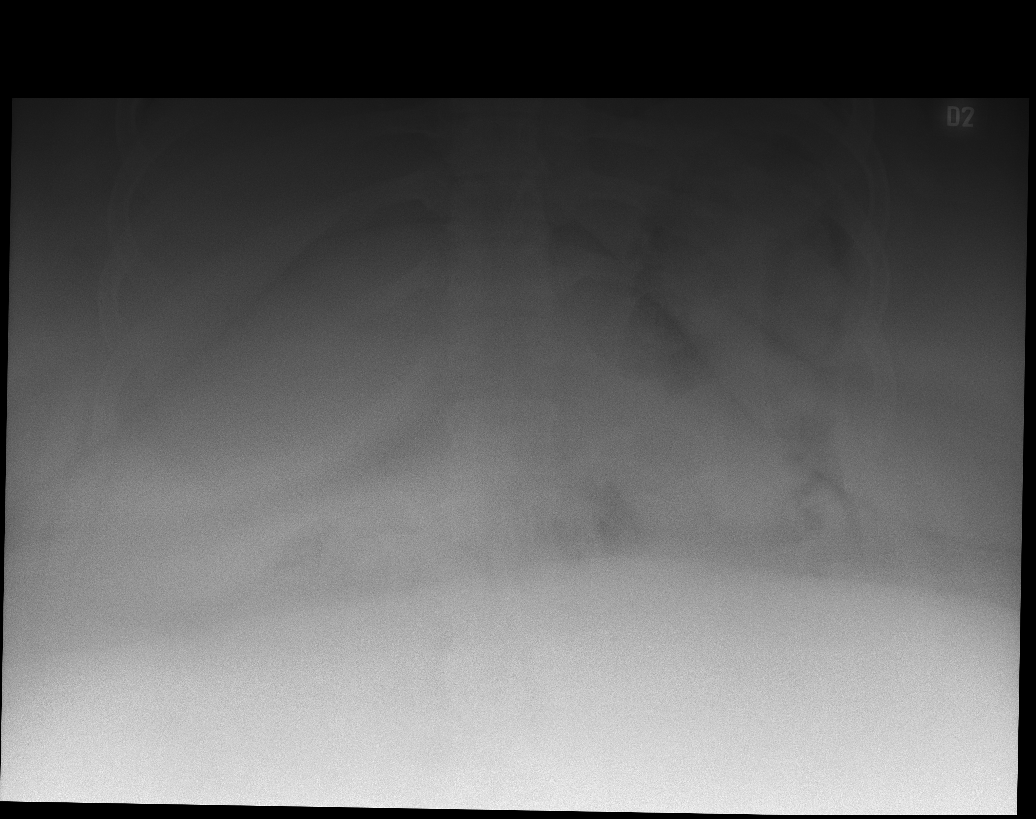

[x abdomen supine (2 of 4)]
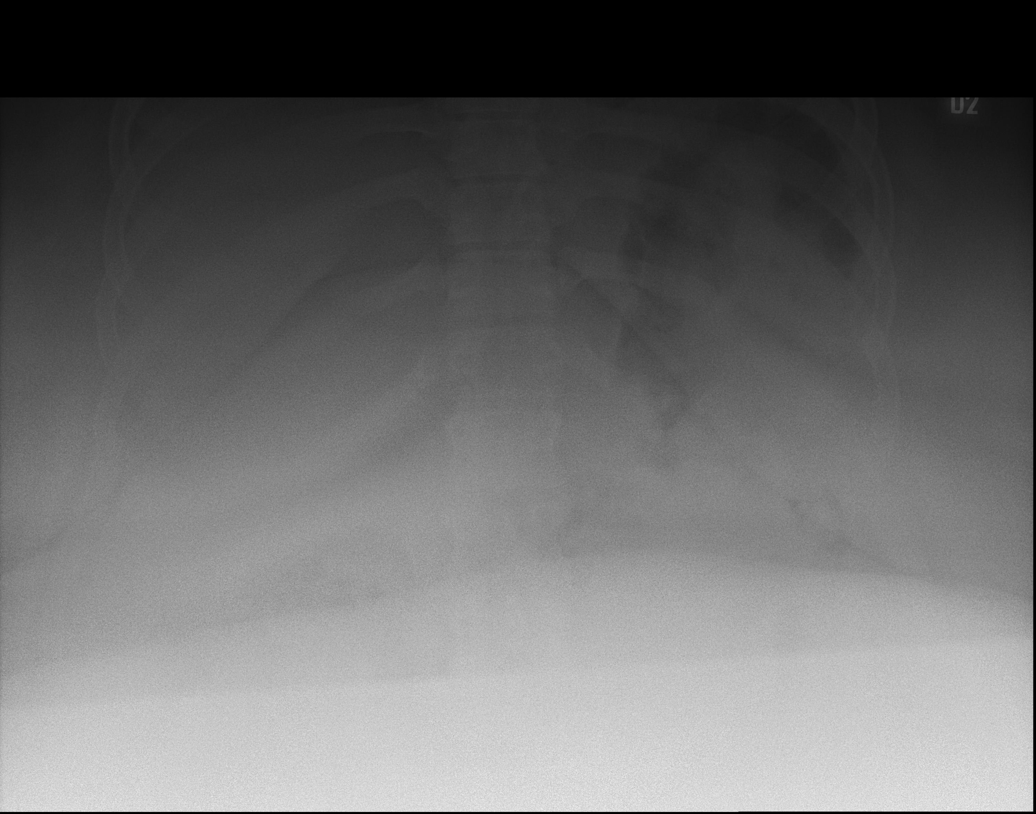

[x abdomen supine (3 of 4)]
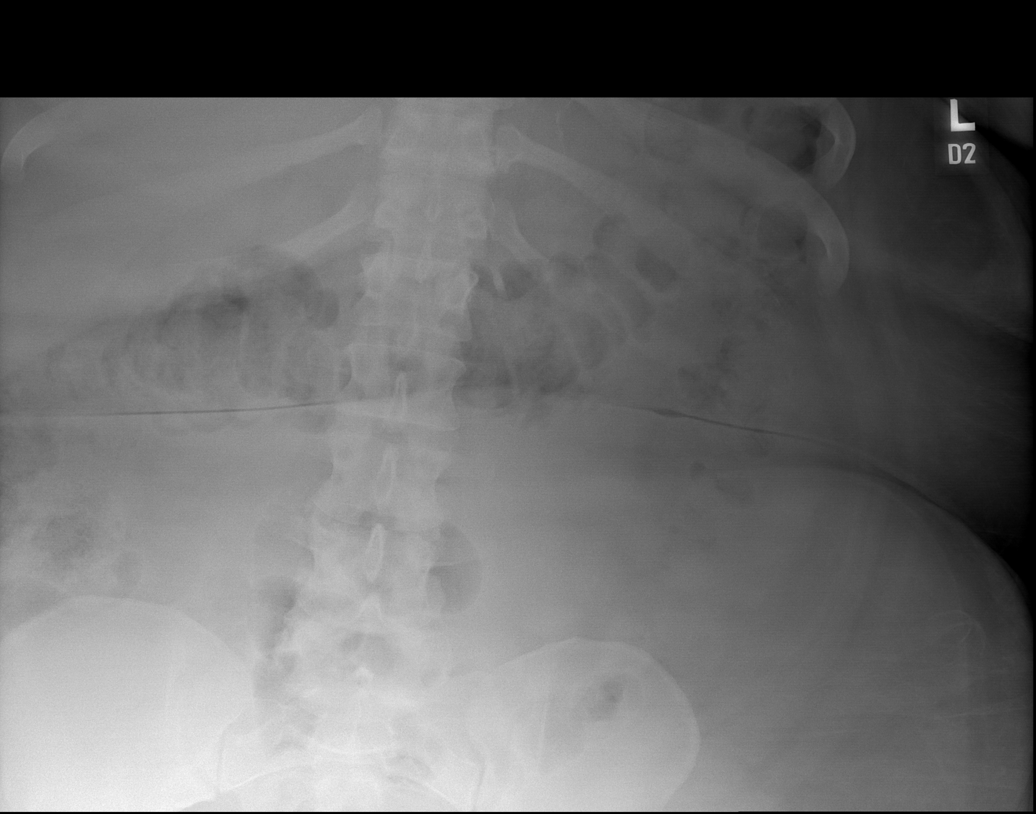

[x abdomen supine (4 of 4)]
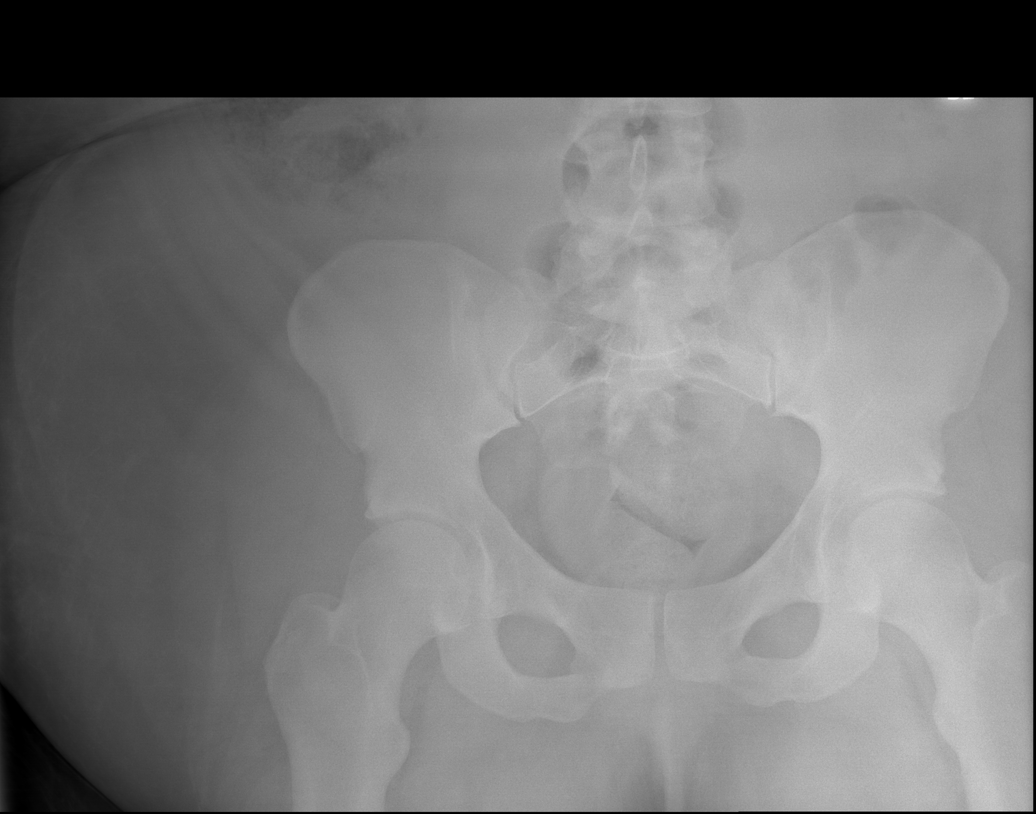

[5 of 5 positions shown; findings below may reference images not displayed]

FINDINGS: Stable cardiomediastinal silhouette with normal heart size. No
pneumothorax. No pleural effusion. Lungs appear clear, with no acute
consolidative airspace disease and no pulmonary edema. No
disproportionately dilated small bowel loops or air-fluid levels.
Moderate diffuse colorectal stool. No evidence of pneumatosis or
pneumoperitoneum. No radiopaque nephrolithiasis.
IMPRESSION: 1. Nonobstructive bowel gas pattern.
2. Moderate diffuse colorectal stool, suggesting constipation.
3. No active cardiopulmonary disease.

## 2022-11-22 ENCOUNTER — Other Ambulatory Visit: Payer: Self-pay

## 2022-11-22 ENCOUNTER — Encounter (HOSPITAL_BASED_OUTPATIENT_CLINIC_OR_DEPARTMENT_OTHER): Payer: Self-pay | Admitting: Emergency Medicine

## 2022-11-22 ENCOUNTER — Emergency Department (HOSPITAL_BASED_OUTPATIENT_CLINIC_OR_DEPARTMENT_OTHER)
Admission: EM | Admit: 2022-11-22 | Discharge: 2022-11-22 | Disposition: A | Discharge status: Home / Self Care | Payer: BLUE CROSS/BLUE SHIELD | Attending: Emergency Medicine | Admitting: Emergency Medicine

## 2022-11-22 DIAGNOSIS — U071 COVID-19: Secondary | ICD-10-CM | POA: Diagnosis not present

## 2022-11-22 DIAGNOSIS — R059 Cough, unspecified: Secondary | ICD-10-CM | POA: Diagnosis present

## 2022-11-22 LAB — RESP PANEL BY RT-PCR (RSV, FLU A&B, COVID)  RVPGX2
Influenza A by PCR: NEGATIVE
Influenza B by PCR: NEGATIVE
Resp Syncytial Virus by PCR: NEGATIVE
SARS Coronavirus 2 by RT PCR: POSITIVE — AB

## 2022-11-22 MED ORDER — FLUTICASONE PROPIONATE 50 MCG/ACT NA SUSP: 2.0000 | Freq: Every day | NASAL | 0 refills | Status: DC

## 2022-11-22 MED ORDER — CETIRIZINE HCL 10 MG PO TABS: 10.0000 mg | ORAL_TABLET | Freq: Every day | ORAL | 0 refills | Status: DC

## 2022-11-22 MED ORDER — ALBUTEROL SULFATE HFA 108 (90 BASE) MCG/ACT IN AERS
2.0000 | INHALATION_SPRAY | RESPIRATORY_TRACT | Status: DC | PRN
  Administered 2022-11-22: 2 via RESPIRATORY_TRACT
  Filled 2022-11-22: qty 6.7

## 2022-11-22 MED ORDER — BENZONATATE 100 MG PO CAPS: 100.0000 mg | ORAL_CAPSULE | Freq: Three times a day (TID) | ORAL | 0 refills | Status: DC

## 2022-11-22 NOTE — ED Triage Notes (Signed)
Cough, URI symptoms started Thursday, getting worse.

## 2022-11-22 NOTE — Discharge Instructions (Signed)
Your COVID test today was positive.  This likely the cause of your symptoms.  I have written you for a few medications to help.  Take as prescribed.  May also take Tylenol at home.  Do not take the ibuprofen given her history of gastric bypass  Follow-up with primary care provider, return for any worsening symptoms

## 2022-11-22 NOTE — ED Provider Notes (Signed)
[place] EMERGENCY DEPARTMENT AT [facility] Provider Note   CSN: [id number] Arrival date & time: 11/22/22  1448     History  Chief Complaint  Patient presents with   URI    Laura Benson is a 32 y.o. female here for evaluation of feeling unwell.  On Thursday developed congestion, rhinorrhea, loss of taste and smell, cough, myalgias and headache.  Taking Tylenol at home.  Was around someone who was sick however she is unsure if they tested positive for anything.  Some chills without documented fever.  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  No weakness, chest pain, shortness of breath, emesis. Some hoarse voice without sore throat.  HPI     Home Medications Prior to Admission medications   Medication Sig Start Date End Date Taking? Authorizing Provider  benzonatate (TESSALON) 100 MG capsule Take 1 capsule (100 mg total) by mouth every 8 (eight) hours. 11/22/22  Yes Gladiola Madore A, PA-C  cetirizine (ZYRTEC ALLERGY) 10 MG tablet Take 1 tablet (10 mg total) by mouth daily. 11/22/22  Yes Dalayna Lauter A, PA-C  fluticasone (FLONASE) 50 MCG/ACT nasal spray Place 2 sprays into both nostrils daily. 11/22/22  Yes Ryosuke Ericksen A, PA-C  albuterol (VENTOLIN HFA) 108 (90 Base) MCG/ACT inhaler Inhale 2 puffs into the lungs every 6 (six) hours as needed for wheezing or shortness of breath. 02/03/22   Sharlene Dory, DO  amLODipine (NORVASC) 10 MG tablet Take 1 tablet (10 mg total) by mouth daily. 09/05/21   Starleen Blue, NP  ARIPiprazole (ABILIFY) 10 MG tablet Take 1 tablet by mouth once a day as directed 08/07/22     ARIPiprazole (ABILIFY) 5 MG tablet Take 1 tablet (5 mg total) by mouth at bedtime. 01/14/22   Sharlene Dory, DO  ARIPiprazole (ABILIFY) 5 MG tablet Take 1 tablet (5 mg total) by mouth daily. 07/09/22     dicyclomine (BENTYL) 20 MG tablet Take 1 tablet (20 mg total) by mouth 2 (two) times daily. 09/25/21   Theron Arista, PA-C  DULoxetine  (CYMBALTA) 60 MG capsule Take 1 capsule (60 mg total) by mouth daily. 01/14/22   Sharlene Dory, DO  DULoxetine (CYMBALTA) 60 MG capsule Take 1 capsule (60 mg total) by mouth daily. 07/09/22     DULoxetine (CYMBALTA) 60 MG capsule Take 1 capsule by mouth once a day 08/07/22     famotidine (PEPCID) 20 MG tablet Take 1 tablet (20 mg total) by mouth 2 (two) times daily. 09/27/21   Glyn Ade, MD  hydrOXYzine (ATARAX) 25 MG tablet Take 1 tablet (25 mg total) by mouth 3 (three) times daily as needed for anxiety. 01/14/22   Sharlene Dory, DO  hydrOXYzine (ATARAX) 25 MG tablet Take 1 tablet (25 mg total) by mouth daily as needed. 07/09/22     hydrOXYzine (ATARAX) 25 MG tablet Take 1 tablet by mouth 3 times a day as needed 08/07/22     lidocaine (LIDODERM) 5 % Place 1 patch onto the skin daily. Remove & Discard patch within 12 hours or as directed by MD 09/05/21   Starleen Blue, NP  lisinopril (ZESTRIL) 40 MG tablet Take 1 tablet (40 mg total) by mouth daily. 01/14/22   Sharlene Dory, DO  melatonin 3 MG TABS tablet Take 1 tablet (3 mg total) by mouth at bedtime. 09/04/21   Starleen Blue, NP  norethindrone-ethinyl estradiol-FE (LOESTRIN FE) 1-20 MG-MCG tablet Take 1 tablet by mouth daily. 07/03/21   Sharlene Dory,  DO  tiZANidine (ZANAFLEX) 4 MG tablet Take 1 tablet (4 mg total) by mouth every 8 (eight) hours as needed for muscle spasms. Do not take with alcohol or while driving or operating heavy machinery.  May cause drowsiness. 04/11/22   Valentino Nose, NP  traZODone (DESYREL) 100 MG tablet Take 1 tablet by mouth every night at bedtime as directed 08/07/22     Vitamin D, Ergocalciferol, (DRISDOL) 1.25 MG (50000 UNIT) CAPS capsule Take 1 capsule (50,000 Units total) by mouth every 7 (seven) days. 09/09/21   Starleen Blue, NP      Allergies    Patient has no known allergies.    Review of Systems   Review of Systems  Constitutional:  Positive for chills and  fatigue.  HENT:  Positive for congestion, postnasal drip, rhinorrhea and sinus pressure. Negative for sore throat, tinnitus, trouble swallowing and voice change.   Respiratory:  Positive for cough. Negative for apnea, choking, chest tightness, shortness of breath, wheezing and stridor.   Cardiovascular: Negative.   Gastrointestinal: Negative.   Genitourinary: Negative.   Musculoskeletal: Negative.   Neurological: Negative.   All other systems reviewed and are negative.   Physical Exam Updated Vital Signs BP (!) 182/114 (BP Location: Right Arm)   Pulse 80   Temp 99.3 F (37.4 C) (Oral)   Resp 18   LMP 11/05/2022   SpO2 100%  Physical Exam Vitals and nursing note reviewed.  Constitutional:      General: She is not in acute distress.    Appearance: She is well-developed. She is obese. She is not ill-appearing, toxic-appearing or diaphoretic.  HENT:     Head: Atraumatic.     Nose: Congestion and rhinorrhea present.     Comments: Clear rhinorrhea bilaterally    Mouth/Throat:     Mouth: Mucous membranes are moist.     Pharynx: No oropharyngeal exudate or posterior oropharyngeal erythema.     Comments: Posterior pharynx clear.  Uvula midline.  No tonsillar exudate.  Sublingual soft, no pooling of secretions Eyes:     Pupils: Pupils are equal, round, and reactive to light.  Neck:     Comments: Full range of motion, no rigidity Cardiovascular:     Rate and Rhythm: Normal rate.     Pulses: Normal pulses.     Heart sounds: Normal heart sounds.  Pulmonary:     Effort: Pulmonary effort is normal. No respiratory distress.     Breath sounds: Normal breath sounds.     Comments: Clear bilaterally, speaks in full sentences without difficulty Abdominal:     General: Bowel sounds are normal. There is no distension.     Palpations: Abdomen is soft.     Tenderness: There is no abdominal tenderness. There is no guarding.  Musculoskeletal:        General: Normal range of motion.      Cervical back: Normal range of motion.  Skin:    General: Skin is warm and dry.     Capillary Refill: Capillary refill takes less than 2 seconds.  Neurological:     General: No focal deficit present.     Mental Status: She is alert.     Cranial Nerves: No cranial nerve deficit.     Motor: No weakness.     Gait: Gait normal.  Psychiatric:        Mood and Affect: Mood normal.     ED Results / Procedures / Treatments   Labs (all labs ordered are listed,  but only abnormal results are displayed) Labs Reviewed  RESP PANEL BY RT-PCR (RSV, FLU A&B, COVID)  RVPGX2 - Abnormal; Notable for the following components:      Result Value   SARS Coronavirus 2 by RT PCR POSITIVE (*)    All other components within normal limits    EKG None  Radiology No results found.  Procedures Procedures    Medications Ordered in ED Medications  albuterol (VENTOLIN HFA) 108 (90 Base) MCG/ACT inhaler 2 puff (2 puffs Inhalation Given 11/22/22 1727)    ED Course/ Medical Decision Making/ A&P   32 year old here for evaluation of URI symptoms.  Started on Thursday.  She is afebrile, nonseptic, not ill-appearing.  Heart and lungs clear.  Abdomen soft, nontender.  Appears clinically well-hydrated.  Does not appear grossly fluid overloaded.  Posterior oropharynx clear.  Uvula midline.  No neck stiffness or neck rigidity.  No meningismus.  Labs personally viewed and interpreted:  COVID-positive  Discussed results with patient in room.  Prescriptions written for symptomatic management.  Will have her follow-up outpatient.  She will return for any worsening symptoms.  Of note she does have elevated blood pressure here.  Currently asymptomatic.  Low suspicion for hypertensive urgency or emergency. Will have her FU with PCP for reevaluation or return if she develops symptoms.  The patient has been appropriately medically screened and/or stabilized in the ED. I have low suspicion for any other emergent medical  condition which would require further screening, evaluation or treatment in the ED or require inpatient management.  Patient is hemodynamically stable and in no acute distress.  Patient able to ambulate in department prior to ED.  Evaluation does not show acute pathology that would require ongoing or additional emergent interventions while in the emergency department or further inpatient treatment.  I have discussed the diagnosis with the patient and answered all questions.  Pain is been managed while in the emergency department and patient has no further complaints prior to discharge.  Patient is comfortable with plan discussed in room and is stable for discharge at this time.  I have discussed strict return precautions for returning to the emergency department.  Patient was encouraged to follow-up with PCP/specialist refer to at discharge.                                 Medical Decision Making Amount and/or Complexity of Data Reviewed External Data Reviewed: labs, radiology and notes. Labs: ordered. Decision-making details documented in ED Course.  Risk OTC drugs. Prescription drug management. Decision regarding hospitalization. Diagnosis or treatment significantly limited by social determinants of health.          Final Clinical Impression(s) / ED Diagnoses Final diagnoses:  COVID    Rx / DC Orders ED Discharge Orders          Ordered    cetirizine (ZYRTEC ALLERGY) 10 MG tablet  Daily        11/22/22 1723    fluticasone (FLONASE) 50 MCG/ACT nasal spray  Daily        11/22/22 1723    benzonatate (TESSALON) 100 MG capsule  Every 8 hours        11/22/22 1723              Treina Arscott A, PA-C 11/22/22 1748    Arby Barrette, MD 11/23/22 1502

## 2023-02-05 ENCOUNTER — Emergency Department (HOSPITAL_BASED_OUTPATIENT_CLINIC_OR_DEPARTMENT_OTHER)
Admission: EM | Admit: 2023-02-05 | Discharge: 2023-02-05 | Disposition: A | Discharge status: Home / Self Care | Payer: Managed Care, Other (non HMO) | Attending: Emergency Medicine | Admitting: Emergency Medicine

## 2023-02-05 ENCOUNTER — Other Ambulatory Visit: Payer: Self-pay

## 2023-02-05 ENCOUNTER — Encounter (HOSPITAL_BASED_OUTPATIENT_CLINIC_OR_DEPARTMENT_OTHER): Payer: Self-pay

## 2023-02-05 DIAGNOSIS — E119 Type 2 diabetes mellitus without complications: Secondary | ICD-10-CM | POA: Diagnosis not present

## 2023-02-05 DIAGNOSIS — R1032 Left lower quadrant pain: Secondary | ICD-10-CM | POA: Insufficient documentation

## 2023-02-05 DIAGNOSIS — R110 Nausea: Secondary | ICD-10-CM | POA: Diagnosis not present

## 2023-02-05 DIAGNOSIS — Z3201 Encounter for pregnancy test, result positive: Secondary | ICD-10-CM | POA: Insufficient documentation

## 2023-02-05 DIAGNOSIS — J45909 Unspecified asthma, uncomplicated: Secondary | ICD-10-CM | POA: Insufficient documentation

## 2023-02-05 DIAGNOSIS — R1031 Right lower quadrant pain: Secondary | ICD-10-CM | POA: Insufficient documentation

## 2023-02-05 DIAGNOSIS — I10 Essential (primary) hypertension: Secondary | ICD-10-CM | POA: Diagnosis not present

## 2023-02-05 DIAGNOSIS — Z79899 Other long term (current) drug therapy: Secondary | ICD-10-CM | POA: Diagnosis not present

## 2023-02-05 LAB — CBC
HCT: 35.1 % — ABNORMAL LOW (ref 36.0–46.0)
Hemoglobin: 10.7 g/dL — ABNORMAL LOW (ref 12.0–15.0)
MCH: 20.7 pg — ABNORMAL LOW (ref 26.0–34.0)
MCHC: 30.5 g/dL (ref 30.0–36.0)
MCV: 67.9 fL — ABNORMAL LOW (ref 80.0–100.0)
Platelets: 348 10*3/uL (ref 150–400)
RBC: 5.17 MIL/uL — ABNORMAL HIGH (ref 3.87–5.11)
RDW: 21 % — ABNORMAL HIGH (ref 11.5–15.5)
WBC: 7.1 10*3/uL (ref 4.0–10.5)
nRBC: 0 % (ref 0.0–0.2)

## 2023-02-05 LAB — URINALYSIS, ROUTINE W REFLEX MICROSCOPIC
Bilirubin Urine: NEGATIVE
Glucose, UA: NEGATIVE mg/dL
Hgb urine dipstick: NEGATIVE
Ketones, ur: NEGATIVE mg/dL
Leukocytes,Ua: NEGATIVE
Nitrite: NEGATIVE
Specific Gravity, Urine: 1.026 (ref 1.005–1.030)
pH: 5.5 (ref 5.0–8.0)

## 2023-02-05 LAB — COMPREHENSIVE METABOLIC PANEL
ALT: 12 U/L (ref 0–44)
AST: 12 U/L — ABNORMAL LOW (ref 15–41)
Albumin: 4.1 g/dL (ref 3.5–5.0)
Alkaline Phosphatase: 47 U/L (ref 38–126)
Anion gap: 7 (ref 5–15)
BUN: 10 mg/dL (ref 6–20)
CO2: 23 mmol/L (ref 22–32)
Calcium: 9 mg/dL (ref 8.9–10.3)
Chloride: 106 mmol/L (ref 98–111)
Creatinine, Ser: 0.89 mg/dL (ref 0.44–1.00)
GFR, Estimated: >60 mL/min (ref >60)
Glucose, Bld: 108 mg/dL — ABNORMAL HIGH (ref 70–99)
Potassium: 3.9 mmol/L (ref 3.5–5.1)
Sodium: 136 mmol/L (ref 135–145)
Total Bilirubin: 0.6 mg/dL (ref 0.0–1.2)
Total Protein: 7.4 g/dL (ref 6.5–8.1)

## 2023-02-05 LAB — PREGNANCY, URINE: Preg Test, Ur: POSITIVE — AB

## 2023-02-05 LAB — HCG, QUANTITATIVE, PREGNANCY: hCG, Beta Chain, Quant, S: 1084 m[IU]/mL — ABNORMAL HIGH (ref <5)

## 2023-02-05 LAB — LIPASE, BLOOD: Lipase: 17 U/L (ref 11–51)

## 2023-02-05 MED ORDER — ONDANSETRON HCL 4 MG/2ML IJ SOLN: 4.0000 mg | Freq: Once | INTRAMUSCULAR | Status: DC | PRN

## 2023-02-05 NOTE — ED Triage Notes (Signed)
 Pt reports lower abdominal pain coming on gradually over past week. Pt reports associated nausea. Denies vomiting/diarrhea. Pt reports normal PO intake. Denies CP/SOB/fever. Normal urinary habits. Pt reports tylenol  has improved pain. No aggravating factors noted.

## 2023-02-05 NOTE — Discharge Instructions (Addendum)
 You were seen in the emergency department today for abdominal pain.  As we discussed your testing shows that you are pregnant.  Likely within the first 4 weeks of pregnancy.  I reviewed your medications, you can continue: - amlodipine  for blood pressure,  - aripiprazole  for mood stabilization,  - duloxetine  for anxiety - tizanidine  for muscle spasms - trazodone  for sleep  I would stop your hydroxyzine .   I would start taking a daily prenatal vitamin.   Please follow up for a recheck beta hcg level, either in the ER or at the [facility] office. I have attached the addresses and contact information for both offices we talked about.   Continue to monitor how you're doing and return to the ER for new or worsening symptoms.

## 2023-02-05 NOTE — ED Provider Notes (Signed)
 [place] EMERGENCY DEPARTMENT AT [facility] Provider Note   CSN: [id number] Arrival date & time: 02/05/23  1919     History  Chief Complaint  Patient presents with   Abdominal Pain    Laura Benson is a 33 y.o. female with history of hypertension, diabetes, asthma, sleep apnea on CPAP, anxiety, PCOS, who presents to the emergency department complaining of lower abdominal pain for the past week.  Has associated nausea, no vomiting.  Normal bowel movements.  No urinary symptoms, vaginal bleeding or discharge.  She reports last menstrual period last month, has not had it yet this month.  She is concerned about pregnancy.  She has not taken a test at home.  Reports Tylenol  has improved her pain, and she has not identified any aggravating factors.  Has never been pregnant before.  Patient states she has not been taking her blood pressure medication.   Abdominal Pain Associated symptoms: nausea        Home Medications Prior to Admission medications   Medication Sig Start Date End Date Taking? Authorizing Provider  albuterol  (VENTOLIN  HFA) 108 (90 Base) MCG/ACT inhaler Inhale 2 puffs into the lungs every 6 (six) hours as needed for wheezing or shortness of breath. 02/03/22   Frann Mabel Mt, DO  amLODipine  (NORVASC ) 10 MG tablet Take 1 tablet (10 mg total) by mouth daily. 09/05/21   Tex Drilling, NP  ARIPiprazole  (ABILIFY ) 5 MG tablet Take 1 tablet (5 mg total) by mouth at bedtime. 01/14/22   Frann Mabel Mt, DO  benzonatate  (TESSALON ) 100 MG capsule Take 1 capsule (100 mg total) by mouth every 8 (eight) hours. 11/22/22   Henderly, Britni A, PA-C  cetirizine  (ZYRTEC  ALLERGY) 10 MG tablet Take 1 tablet (10 mg total) by mouth daily. 11/22/22   Henderly, Britni A, PA-C  dicyclomine  (BENTYL ) 20 MG tablet Take 1 tablet (20 mg total) by mouth 2 (two) times daily. 09/25/21   Emelia Sluder, PA-C  DULoxetine  (CYMBALTA ) 60 MG capsule Take 1 capsule (60 mg total) by mouth  daily. 01/14/22   Frann Mabel Mt, DO  famotidine  (PEPCID ) 20 MG tablet Take 1 tablet (20 mg total) by mouth 2 (two) times daily. 09/27/21   Jerral Meth, MD  fluticasone  (FLONASE ) 50 MCG/ACT nasal spray Place 2 sprays into both nostrils daily. 11/22/22   Henderly, Britni A, PA-C  hydrOXYzine  (ATARAX ) 25 MG tablet Take 1 tablet (25 mg total) by mouth 3 (three) times daily as needed for anxiety. 01/14/22   Frann Mabel Mt, DO  lidocaine  (LIDODERM ) 5 % Place 1 patch onto the skin daily. Remove & Discard patch within 12 hours or as directed by MD 09/05/21   Tex Drilling, NP  melatonin 3 MG TABS tablet Take 1 tablet (3 mg total) by mouth at bedtime. 09/04/21   Tex Drilling, NP  tiZANidine  (ZANAFLEX ) 4 MG tablet Take 1 tablet (4 mg total) by mouth every 8 (eight) hours as needed for muscle spasms. Do not take with alcohol or while driving or operating heavy machinery.  May cause drowsiness. 04/11/22   Chandra Harlene LABOR, NP  traZODone  (DESYREL ) 100 MG tablet Take 1 tablet by mouth every night at bedtime as directed 08/07/22     Vitamin D , Ergocalciferol , (DRISDOL ) 1.25 MG (50000 UNIT) CAPS capsule Take 1 capsule (50,000 Units total) by mouth every 7 (seven) days. 09/09/21   Tex Drilling, NP      Allergies    Patient has no known allergies.    Review of Systems  Review of Systems  Gastrointestinal:  Positive for abdominal pain and nausea.  Genitourinary:  Positive for menstrual problem.  All other systems reviewed and are negative.   Physical Exam Updated Vital Signs BP 134/86   Pulse 83   Temp 98.3 F (36.8 C) (Oral)   Resp 20   Ht 5' 6 (1.676 m)   Wt (!) 167.8 kg   SpO2 100%   BMI 59.72 kg/m  Physical Exam Vitals and nursing note reviewed.  Constitutional:      Appearance: Normal appearance.  HENT:     Head: Normocephalic and atraumatic.  Eyes:     Conjunctiva/sclera: Conjunctivae normal.  Cardiovascular:     Rate and Rhythm: Normal rate and regular rhythm.   Pulmonary:     Effort: Pulmonary effort is normal. No respiratory distress.     Breath sounds: Normal breath sounds.  Abdominal:     General: There is no distension.     Palpations: Abdomen is soft.     Tenderness: There is no abdominal tenderness.  Skin:    General: Skin is warm and dry.  Neurological:     General: No focal deficit present.     Mental Status: She is alert.    ED Results / Procedures / Treatments   Labs (all labs ordered are listed, but only abnormal results are displayed) Labs Reviewed  COMPREHENSIVE METABOLIC PANEL - Abnormal; Notable for the following components:      Result Value   Glucose, Bld 108 (*)    AST 12 (*)    All other components within normal limits  CBC - Abnormal; Notable for the following components:   RBC 5.17 (*)    Hemoglobin 10.7 (*)    HCT 35.1 (*)    MCV 67.9 (*)    MCH 20.7 (*)    RDW 21.0 (*)    All other components within normal limits  URINALYSIS, ROUTINE W REFLEX MICROSCOPIC - Abnormal; Notable for the following components:   APPearance HAZY (*)    Protein, ur TRACE (*)    Bacteria, UA RARE (*)    All other components within normal limits  PREGNANCY, URINE - Abnormal; Notable for the following components:   Preg Test, Ur POSITIVE (*)    All other components within normal limits  HCG, QUANTITATIVE, PREGNANCY - Abnormal; Notable for the following components:   hCG, Beta Chain, Quant, S 1,084 (*)    All other components within normal limits  LIPASE, BLOOD    EKG None  Radiology No results found.  Procedures Procedures    Medications Ordered in ED Medications - No data to display   ED Course/ Medical Decision Making/ A&P                                 Medical Decision Making Amount and/or Complexity of Data Reviewed Labs: ordered.  Risk Prescription drug management.   This patient is a 33 y.o. female  who presents to the ED for concern of lower abdominal pain x 1 week.   Differential diagnoses prior  to evaluation: The emergent differential diagnosis includes, but is not limited to,  PID, ectopic pregnancy, appendicitis, kidney stone, dysmenorrhea, septic abortion, ruptured ovarian cyst, ovarian torsion, tubo-ovarian abscess, fibroids, endometriosis, diverticulitis, cystitis. This is not an exhaustive differential.   Past Medical History / Co-morbidities / Social History: hypertension, diabetes, asthma, sleep apnea on CPAP, anxiety, PCOS  Additional history: Chart reviewed.  Pertinent results include: Patient is supposed be on 40 mg lisinopril  and 10 mg amlodipine  daily.   Physical Exam: Physical exam performed. The pertinent findings include: Significantly hypertensive, initially to 200/107 in triage, on my exam systolic in the 170s. No headache, CP, blurry vision. Abdomen soft and non-tender.   Lab Tests/Imaging studies: I personally interpreted labs/imaging and the pertinent results include: CBC with stable hemoglobin.  CMP unremarkable.  Normal lipase.  UA with trace protein, rare bacteria.  Urine pregnancy positive, beta-hCG 1084.  Disposition: After consideration of the diagnostic results and the patients response to treatment, I feel that emergency department workup does not suggest an emergent condition requiring admission or immediate intervention beyond what has been performed at this time. The plan is: Discharge to home.  Explained patient's positive pregnancy test, and need for 48-hour follow-up for repeat beta-hCG.  At this time I have low clinical concern for ectopic pregnancy or miscarriage, as patients having very little discomfort, and no bleeding.  She does not currently have an OB/GYN, and I have referred her to one.  Patient is agreeable to the plan.  Recommended that she start a prenatal, and we extensively went through her medication list and I made recommendations.  The patient is safe for discharge and has been instructed to return immediately for worsening symptoms, change  in symptoms or any other concerns.  Final Clinical Impression(s) / ED Diagnoses Final diagnoses:  Bilateral lower abdominal discomfort  Positive pregnancy test  Primary hypertension    Rx / DC Orders ED Discharge Orders     None      Portions of this report may have been transcribed using voice recognition software. Every effort was made to ensure accuracy; however, inadvertent computerized transcription errors may be present.    Brindley Madarang T, PA-C 02/05/23 2059    Mannie Pac T, DO 02/05/23 2254

## 2023-02-09 ENCOUNTER — Inpatient Hospital Stay (HOSPITAL_COMMUNITY)
Admission: AD | Admit: 2023-02-09 | Discharge: 2023-02-09 | Disposition: A | Discharge status: Home / Self Care | Payer: Managed Care, Other (non HMO) | Attending: Obstetrics & Gynecology | Admitting: Obstetrics & Gynecology

## 2023-02-09 ENCOUNTER — Inpatient Hospital Stay (HOSPITAL_COMMUNITY): Payer: Managed Care, Other (non HMO)

## 2023-02-09 ENCOUNTER — Encounter (HOSPITAL_COMMUNITY): Payer: Self-pay

## 2023-02-09 DIAGNOSIS — Z3A01 Less than 8 weeks gestation of pregnancy: Secondary | ICD-10-CM

## 2023-02-09 DIAGNOSIS — O26891 Other specified pregnancy related conditions, first trimester: Secondary | ICD-10-CM | POA: Diagnosis present

## 2023-02-09 DIAGNOSIS — O3680X0 Pregnancy with inconclusive fetal viability, not applicable or unspecified: Secondary | ICD-10-CM | POA: Diagnosis not present

## 2023-02-09 LAB — HCG, QUANTITATIVE, PREGNANCY: hCG, Beta Chain, Quant, S: 4672 m[IU]/mL — ABNORMAL HIGH (ref <5)

## 2023-02-09 NOTE — Discharge Instructions (Signed)
 You were seen in MAU to recheck your hormone levels. Your pregnancy hormone levels have risen appropriately, and your ultrasound shows you have an early pregnancy forming in the correct location in your uterus. Call one of the offices below to establish prenatal care, and schedule an US  in about 2 to 4 weeks to check on the health of your pregnancy.   [facility] [employer] for [employer] at [employer] [facility]             [address], [place], [place] [address] [phone]  Center for [facility] at [facility]                                                             [address], [place], [place], [address] [phone]  Center for [facility] at [facility] [address], [address], [place], [place], [address] [phone]  Center for [facility] at [facility] [address], [place], [place], [address] [phone]  Center for [facility] at [facility]                                 [address] [place], [place], [place], [address] [phone]  Center for [facility] at [facility]                                    [address] [place], [place], [address] [phone]  Center for [facility] at [facility] [address], [address], [place], [place], [address]                              [facility] [place] [address], [address], [place], [place], [address] [phone]  [facility]         Phone: [phone]  [facility]      Phone: [phone]   [facility]      Phone: [phone]  [facility]         Phone: [phone]   [facility]    Phone: [phone]  [facility]      Phone: [phone]  [facility] [place]     Phone:  [phone]  [facility]        Phone: [phone]  [facility] [place]) [phone]  [facility]      Phone: [phone]

## 2023-02-09 NOTE — MAU Note (Signed)
..  Laura Benson is a 33 y.o. at [redacted]w[redacted]d here in MAU reporting: here for repeat hcg. Was seen at [facility] on Thursday for lower abdominal cramping and was pregnant. States she is not having any pain or bleeding today. Was supposed to follow up over the weekend but did not want to come out because of the weather.   Patient has CHTN, was on amlodipine  but stopped taking all of her meds on Thursday until she could find out what was safe and what was not.   Pain score: 0 Vitals:   02/09/23 1446  BP: (!) 168/96  Pulse: 94  Resp: 20  Temp: 98 F (36.7 C)  SpO2: 100%      Lab orders placed from triage:UA
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
## 2023-02-27 ENCOUNTER — Encounter: Payer: Self-pay | Admitting: Obstetrics and Gynecology

## 2023-03-04 ENCOUNTER — Other Ambulatory Visit: Payer: Self-pay

## 2023-03-04 MED ORDER — PROMETHAZINE HCL 25 MG PO TABS: 25.0000 mg | ORAL_TABLET | Freq: Four times a day (QID) | ORAL | 0 refills | Status: DC | PRN

## 2023-03-05 ENCOUNTER — Inpatient Hospital Stay (HOSPITAL_COMMUNITY)
Admission: AD | Admit: 2023-03-05 | Discharge: 2023-03-05 | Disposition: A | Discharge status: Home / Self Care | Payer: Managed Care, Other (non HMO) | Attending: Obstetrics and Gynecology | Admitting: Obstetrics and Gynecology

## 2023-03-05 ENCOUNTER — Inpatient Hospital Stay (HOSPITAL_COMMUNITY): Payer: Managed Care, Other (non HMO)

## 2023-03-05 DIAGNOSIS — R519 Headache, unspecified: Secondary | ICD-10-CM | POA: Insufficient documentation

## 2023-03-05 DIAGNOSIS — Z8619 Personal history of other infectious and parasitic diseases: Secondary | ICD-10-CM | POA: Diagnosis not present

## 2023-03-05 DIAGNOSIS — Z3A08 8 weeks gestation of pregnancy: Secondary | ICD-10-CM

## 2023-03-05 DIAGNOSIS — R350 Frequency of micturition: Secondary | ICD-10-CM | POA: Diagnosis not present

## 2023-03-05 DIAGNOSIS — B9689 Other specified bacterial agents as the cause of diseases classified elsewhere: Secondary | ICD-10-CM | POA: Insufficient documentation

## 2023-03-05 DIAGNOSIS — O26891 Other specified pregnancy related conditions, first trimester: Secondary | ICD-10-CM | POA: Insufficient documentation

## 2023-03-05 DIAGNOSIS — O209 Hemorrhage in early pregnancy, unspecified: Secondary | ICD-10-CM

## 2023-03-05 DIAGNOSIS — O23591 Infection of other part of genital tract in pregnancy, first trimester: Secondary | ICD-10-CM | POA: Insufficient documentation

## 2023-03-05 LAB — CBC
HCT: 34.7 % — ABNORMAL LOW (ref 36.0–46.0)
Hemoglobin: 11 g/dL — ABNORMAL LOW (ref 12.0–15.0)
MCH: 21.8 pg — ABNORMAL LOW (ref 26.0–34.0)
MCHC: 31.7 g/dL (ref 30.0–36.0)
MCV: 68.7 fL — ABNORMAL LOW (ref 80.0–100.0)
Platelets: 282 10*3/uL (ref 150–400)
RBC: 5.05 MIL/uL (ref 3.87–5.11)
RDW: 22.5 % — ABNORMAL HIGH (ref 11.5–15.5)
WBC: 6.4 10*3/uL (ref 4.0–10.5)
nRBC: 0 % (ref 0.0–0.2)

## 2023-03-05 LAB — URINALYSIS, MICROSCOPIC (REFLEX): WBC, UA: NONE SEEN WBC/hpf (ref 0–5)

## 2023-03-05 LAB — WET PREP, GENITAL
Sperm: NONE SEEN
Trich, Wet Prep: NONE SEEN
WBC, Wet Prep HPF POC: <10 (ref <10)
Yeast Wet Prep HPF POC: NONE SEEN

## 2023-03-05 LAB — URINALYSIS, ROUTINE W REFLEX MICROSCOPIC
Bilirubin Urine: NEGATIVE
Glucose, UA: NEGATIVE mg/dL
Ketones, ur: NEGATIVE mg/dL
Leukocytes,Ua: NEGATIVE
Nitrite: NEGATIVE
Protein, ur: NEGATIVE mg/dL
Specific Gravity, Urine: 1.01 (ref 1.005–1.030)
pH: 6 (ref 5.0–8.0)

## 2023-03-05 LAB — HCG, QUANTITATIVE, PREGNANCY: hCG, Beta Chain, Quant, S: 99137 m[IU]/mL — ABNORMAL HIGH (ref <5)

## 2023-03-05 MED ORDER — EXCEDRIN TENSION HEADACHE 500-65 MG PO TABS: 2.0000 | ORAL_TABLET | Freq: Three times a day (TID) | ORAL | 0 refills | Status: DC | PRN

## 2023-03-05 MED ORDER — METRONIDAZOLE 500 MG PO TABS: 500.0000 mg | ORAL_TABLET | Freq: Two times a day (BID) | ORAL | 0 refills | Status: DC

## 2023-03-05 NOTE — MAU Provider Note (Signed)
 History    CSN: [id number]  Arrival date and time: 03/05/23 1134   None    Chief Complaint  Patient presents with   Vaginal Bleeding   Patient is a 33 y.o. G1P0 woman at [redacted]w[redacted]d who presents to MAU with vaginal bleeding. Starting this morning, patient noted bright red to pink blood when wiping her vagina. Denies abdominal pain, has occasional mild abdominal cramps score of 1-1.5/10. Reports last intercourse was 1 week ago. Endorses history of STI (gonorrhea, chlamydia, and trich) s/p self and partner treatment in 2017-2018, denies abnormal vaginal discharge. Endorses increased urinary frequency but denies dysuria or hematuria. Had US  on Monday at [facility], no concerns with results. Initial OB appt scheduled for 2/25.   On ROS, patient endorses headache and dizziness for the past week (mild improvement on Tylenol ), mild SOB, mild heartburn, significant nausea (started oral Phenergan  yesterday morning with improvement), vomiting x2. Patient denies vision changes, chest pain, bilateral lower extremity edema.   OB History     Gravida  1   Para      Term      Preterm      AB      Living         SAB      IAB      Ectopic      Multiple      Live Births             Past Medical History:  Diagnosis Date   Anxiety    Asthma    Complication of anesthesia    Take for a while to wake up   Diabetes mellitus without complication (HCC)    Hypertension    Morbid obesity (HCC)    Sleep apnea    CPAP   Tachycardia   Does have history of IBS, rectal bleeding with defecation, umbilical hernia  Past Surgical History:  Procedure Laterality Date   ADENOIDECTOMY     LAPAROSCOPIC GASTRIC SLEEVE RESECTION N/A 04/16/2020   Procedure: LAPAROSCOPIC GASTRIC SLEEVE RESECTION;  Surgeon: Tanda Locus, MD;  Location: [facility];  Service: General;  Laterality: N/A;   TONSILLECTOMY     UPPER GI ENDOSCOPY N/A 04/16/2020   Procedure: UPPER GI ENDOSCOPY;  Surgeon: Tanda Locus, MD;   Location: [facility];  Service: General;  Laterality: N/A;   Family History  Problem Relation Age of Onset   Hypertension Mother    Diabetes Mother    Colon cancer Father    Hypertension Maternal Grandmother    Hypertension Paternal Grandmother    Diabetes Paternal Grandmother    Breast cancer Cousin    Social History   Tobacco Use   Smoking status: Former    Types: Cigarettes   Smokeless tobacco: Never  Vaping Use   Vaping status: Never Used  Substance Use Topics   Alcohol use: No   Drug use: No  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 6.7 g 0    amLODipine  (NORVASC ) 10 MG tablet Take 1 tablet (10 mg total) by mouth daily. 30 tablet 0    benzonatate  (  TESSALON ) 100 MG capsule Take 1 capsule (100 mg total) by mouth every 8 (eight) hours. 21 capsule 0    cetirizine  (ZYRTEC  ALLERGY) 10 MG tablet Take 1 tablet (10 mg total) by mouth daily. 30 tablet 0    dicyclomine  (BENTYL ) 20 MG tablet Take 1 tablet (20 mg total) by mouth 2 (two) times daily. 20 tablet 0    DULoxetine  (CYMBALTA ) 60 MG capsule Take 1 capsule (60 mg total) by mouth daily. 30 capsule 3    famotidine  (PEPCID ) 20 MG tablet Take 1 tablet (20 mg total) by mouth 2 (two) times daily. 30 tablet 0    fluticasone  (FLONASE ) 50 MCG/ACT nasal spray Place 2 sprays into both nostrils daily. 9.9 mL 0    hydrOXYzine  (ATARAX ) 25 MG tablet Take 1 tablet (25 mg total) by mouth 3 (three) times daily as needed for anxiety. 30 tablet 3    lidocaine  (LIDODERM ) 5 % Place 1 patch onto the skin daily.  Remove & Discard patch within 12 hours or as directed by MD 30 patch 0    melatonin 3 MG TABS tablet Take 1 tablet (3 mg total) by mouth at bedtime. 30 tablet 0    promethazine  (PHENERGAN ) 25 MG tablet Take 1 tablet (25 mg total) by mouth every 6 (six) hours as needed for nausea or vomiting. 30 tablet 0    tiZANidine  (ZANAFLEX ) 4 MG tablet Take 1 tablet (4 mg total) by mouth every 8 (eight) hours as needed for muscle spasms. Do not take with alcohol or while driving or operating heavy machinery.  May cause drowsiness. 30 tablet 0    traZODone  (DESYREL ) 100 MG tablet Take 1 tablet by mouth every night at bedtime as directed 30 tablet 2    Vitamin D , Ergocalciferol , (DRISDOL ) 1.25 MG (50000 UNIT) CAPS capsule Take 1 capsule (50,000 Units total) by mouth every 7 (seven) days. 5 capsule 0    Review of Systems: see above per HPI  Physical Exam   Blood pressure 135/67, pulse 67, temperature 98.1 F (36.7 C), temperature source Oral, resp. rate 18, height 5' 6 (1.676 m), weight (!) 173.5 kg, last menstrual period 01/03/2023, SpO2 100%.  Physical Exam Constitutional:      General: She is not in acute distress.    Appearance: She is not ill-appearing.  Cardiovascular:     Rate and Rhythm: Normal rate.  Pulmonary:     Effort: Pulmonary effort is normal.  Abdominal:     Palpations: Abdomen is soft.     Tenderness: There is no abdominal tenderness.  Musculoskeletal:        General: Normal range of motion.     Right ankle: Swelling present.     Left ankle: Swelling present.  Skin:    General: Skin is warm and dry.  Neurological:     General: No focal deficit present.     Mental Status: She is alert and oriented to person, place, and time.  Psychiatric:        Mood and Affect: Mood normal.        Thought Content: Thought content normal.    MAU Course   MDM: moderate  This patient presents to the ED for concern of   Chief Complaint  Patient presents with   Vaginal Bleeding    This  complaint involves an extensive number of treatment options, and is a complaint that carries with it a high risk of complications and morbidity. The differential diagnosis for vaginal bleeding includes ectopic pregnancy  vs STI vs UTI vs ectropion.  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   Encounter for gynecological examination without abnormal finding 02/03/2020   Lumbar radiculopathy 08/17/2019   Type 2 diabetes mellitus with hyperglycemia, without long-term current use of insulin  (HCC) 01/18/2019   Anxiety 12/31/2018   Asthma 06/08/2018   Tachycardia    MDD (major depressive disorder), severe (HCC) 05/05/2017   Alcohol abuse 04/30/2017   Umbilical hernia without obstruction and without gangrene 02/25/2017   Obstructive sleep apnea 12/11/2016   Diabetes mellitus without complication (HCC) 01/23/2016   Essential hypertension 01/23/2016   Dysmenorrhea 12/25/2013   Rectal bleeding 10/20/2012   Depression, recurrent (HCC) 01/05/2009   BREAST PAIN, BILATERAL 01/05/2009   INTERTRIGO, CANDIDAL 01/05/2009   Amenorrhea 09/20/2008   DELAYED MENSES 08/25/2008   ASCUS PAP 05/05/2008   Severe obesity (BMI >= 40) (HCC) 03/26/2006   HYPERTENSION, BENIGN SYSTEMIC 03/26/2006    External records from outside source obtained and reviewed including Prenatal care records  Lab Tests: I ordered, and personally interpreted labs.  The pertinent results include:   Results for orders placed or performed during the hospital encounter of 03/05/23 (from the past 24 hours)  Urinalysis, Routine w reflex microscopic -Urine, Clean Catch     Status: Abnormal   Collection Time: 03/05/23 12:14 PM  Result Value Ref Range    Color, Urine YELLOW YELLOW   APPearance CLEAR CLEAR   Specific Gravity, Urine 1.010 1.005 - 1.030   pH 6.0 5.0 - 8.0   Glucose, UA NEGATIVE NEGATIVE mg/dL   Hgb urine dipstick MODERATE (A) NEGATIVE   Bilirubin Urine NEGATIVE NEGATIVE   Ketones, ur NEGATIVE NEGATIVE mg/dL   Protein, ur NEGATIVE NEGATIVE mg/dL   Nitrite NEGATIVE NEGATIVE   Leukocytes,Ua NEGATIVE NEGATIVE  Urinalysis, Microscopic (reflex)     Status: Abnormal   Collection Time: 03/05/23 12:14 PM  Result Value Ref Range   RBC / HPF 0-5 0 - 5 RBC/hpf   WBC, UA NONE SEEN 0 - 5 WBC/hpf   Bacteria, UA RARE (A) NONE SEEN   Squamous Epithelial / HPF 0-5 0 - 5 /HPF  hCG, quantitative, pregnancy     Status: Abnormal   Collection Time: 03/05/23 12:21 PM  Result Value Ref Range   hCG, Beta Chain, Quant, S 99,137 (H) <5 mIU/mL  CBC     Status: Abnormal   Collection Time: 03/05/23 12:21 PM  Result Value Ref Range   WBC 6.4 4.0 - 10.5 K/uL   RBC 5.05 3.87 - 5.11 MIL/uL   Hemoglobin 11.0 (L) 12.0 - 15.0 g/dL   HCT 65.2 (L) 63.9 - 53.9 %   MCV 68.7 (L) 80.0 - 100.0 fL   MCH 21.8 (L) 26.0 - 34.0 pg   MCHC 31.7 30.0 - 36.0 g/dL   RDW 77.4 (H) 88.4 - 84.4 %   Platelets 282 150 - 400 K/uL   nRBC 0.0 0.0 - 0.2 %  Wet prep, genital     Status: Abnormal   Collection Time: 03/05/23  2:23 PM   Specimen: Vaginal  Result Value Ref Range   Yeast Wet Prep HPF POC NONE SEEN NONE SEEN   Trich, Wet Prep NONE SEEN NONE SEEN   Clue Cells Wet Prep HPF POC PRESENT (A)  NONE SEEN   WBC, Wet Prep HPF POC <10 <10   Sperm NONE SEEN    Imaging Studies ordered:  I ordered imaging studies including Transvaginal US  US  OB Transvaginal Result Date: 03/05/2023 CLINICAL DATA:  Vaginal bleeding. First trimester pregnancy with inconclusive fetal viability. EXAM: TRANSVAGINAL OB ULTRASOUND TECHNIQUE: Transvaginal ultrasound was performed for complete evaluation of the gestation as well as the maternal uterus, adnexal regions, and pelvic cul-de-sac.  COMPARISON:  02/09/2023 FINDINGS: Intrauterine gestational sac: Single Yolk sac:  Visualized. Embryo:  Visualized. Cardiac Activity: Visualized. Heart Rate: 174 bpm CRL:   19 mm   8 w 3 d                  US  EDC: 10/12/2023 Subchorionic hemorrhage:  None visualized. Maternal uterus/adnexae: Both ovaries are normal in appearance. No mass or abnormal or abnormal free fluid identified. IMPRESSION: Single living IUP with estimated gestational age of [redacted] weeks 3 days, and US  EDC of 10/12/2023. Electronically Signed   By: Norleen DELENA Kil M.D.   On: 03/05/2023 14:00   I agree with the radiologist interpretation  Medicines ordered and prescription drug management:  Medications:  Meds ordered this encounter  Medications   metroNIDAZOLE  (FLAGYL ) 500 MG tablet    Sig: Take 1 tablet (500 mg total) by mouth 2 (two) times daily.    Dispense:  14 tablet    Refill:  0   acetaminophen -caffeine (EXCEDRIN  TENSION HEADACHE) 500-65 MG TABS per tablet    Sig: Take 2 tablets by mouth every 8 (eight) hours as needed.    Dispense:  60 tablet    Refill:  0   After the interventions noted above, I reevaluated the patient and counseled patients on lab and imaging results  Dispostion: discharged in stable condition, reviewed return precautions  Assessment and Plan  [redacted] weeks gestation -US  with EGA of [redacted]w[redacted]d and reassuring location and cardiac activity -follow up with OB in 3 weeks Vaginal bleeding secondary to bacterial vaginosis -wet prep positive for BV, prescribed metronidazole  to outpatient pharmacy -G/C results pending Headache -prescribed Excedrin  Tension to outpatient pharmacy  [facility] 03/05/2023, 3:14 PM

## 2023-03-05 NOTE — MAU Note (Signed)
.  Laura Benson is a 33 y.o. at [redacted]w[redacted]d here in MAU reporting: when she went to the bathroom this morning she noticed a scant to small amount of bright red vaginal bleeding with wiping. No pad use - only sees blood with wiping. Does report some mild lower abdominal cramping. She also has frequent constant headache's that she rates 7/10 - has CHTN and takes amlodipine . Denies any abnormal discharge or recent IC. Last had an US  on Monday at [facility].   LMP: N/A Onset of complaint: Today Pain score: 1/10 abdomen, 7/10 head Vitals:   03/05/23 1148  BP: (!) 149/82  Pulse: 67  Resp: 18  Temp: 98.1 F (36.7 C)  SpO2: 100%     FHT: Not indicated  Lab orders placed from triage: UA

## 2023-03-05 NOTE — Discharge Instructions (Signed)
 It was a pleasure taking care of you today.  We were able to visualize your baby under ultrasound and the heart rate was appropriate.  We did notice you have a bacterial vaginosis infection I have sent a treatment for that to your pharmacy.  Regarding your headache I sent Excedrin  tension to your pharmacy but you can also get this over-the-counter.  If you have any worsening symptoms, questions, concerns please return for further evaluation.  I hope you have a great afternoon!

## 2023-03-06 LAB — GC/CHLAMYDIA PROBE AMP (~~LOC~~) NOT AT ARMC
Chlamydia: NEGATIVE
Comment: NEGATIVE
Comment: NORMAL
Neisseria Gonorrhea: NEGATIVE

## 2023-03-17 ENCOUNTER — Telehealth: Payer: Managed Care, Other (non HMO)

## 2023-03-17 DIAGNOSIS — O0990 Supervision of high risk pregnancy, unspecified, unspecified trimester: Secondary | ICD-10-CM | POA: Insufficient documentation

## 2023-03-17 DIAGNOSIS — Z8639 Personal history of other endocrine, nutritional and metabolic disease: Secondary | ICD-10-CM | POA: Insufficient documentation

## 2023-03-17 DIAGNOSIS — Z9884 Bariatric surgery status: Secondary | ICD-10-CM

## 2023-03-17 DIAGNOSIS — Z3A10 10 weeks gestation of pregnancy: Secondary | ICD-10-CM

## 2023-03-17 NOTE — Progress Notes (Signed)
New OB Intake  I connected with Laura Benson  on 03/17/23 at  3:15 PM EST by MyChart Video Visit and verified that I am speaking with the correct person using two identifiers. Nurse is located at [facility] and pt is located at home.  I discussed the limitations, risks, security and privacy concerns of performing an evaluation and management service by telephone and the availability of in person appointments. I also discussed with the patient that there may be a patient responsible charge related to this service. The patient expressed understanding and agreed to proceed.  I explained I am completing New OB Intake today. We discussed EDD of 10/10/2023, by Last Menstrual Period. Pt is G1P0. I reviewed her allergies, medications and Medical/Surgical/OB history.    Patient Active Problem List   Diagnosis Date Noted   Supervision of high risk pregnancy, antepartum 03/17/2023   History of diabetes mellitus resolved following bariatric surgery 03/17/2023   Morbid obesity (HCC) 03/17/2023   MDD (major depressive disorder), recurrent, severe, with psychosis (HCC) 08/31/2021   GAD (generalized anxiety disorder) 10/23/2020   Complication of anesthesia 09/04/2020   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   Encounter for gynecological examination without abnormal finding 02/03/2020   Lumbar radiculopathy 08/17/2019   Type 2 diabetes mellitus with hyperglycemia, without long-term current use of insulin (HCC) 01/18/2019   Anxiety 12/31/2018   Asthma 06/08/2018   Tachycardia    MDD (major depressive disorder), severe (HCC) 05/05/2017   Alcohol abuse 04/30/2017   Umbilical hernia without obstruction and without gangrene 02/25/2017   Obstructive sleep apnea 12/11/2016   Diabetes mellitus without complication (HCC) 01/23/2016   Essential hypertension 01/23/2016   Depression, recurrent (HCC) 01/05/2009   ASCUS PAP 05/05/2008   Severe obesity (BMI >= 40) (HCC) 03/26/2006   HYPERTENSION, BENIGN  SYSTEMIC 03/26/2006    Concerns addressed today  Delivery Plans Plans to deliver at [facility]. Discussed the nature of our practice with multiple providers including residents and students. Due to the size of the practice, the delivering provider may not be the same as those providing prenatal care.   Patient is not interested in water birth.   MyChart/Babyscripts MyChart access verified. I explained pt will have some visits in office and some virtually. Babyscripts instructions given and order placed.  Blood Pressure Cuff/Weight Scale Patient has private insurance; instructed to purchase blood pressure cuff and bring to first prenatal appt. Explained after first prenatal appt pt will check weekly and document in Babyscripts. Patient does have weight scale.  Anatomy US Explained next scheduled [place] will be around 19 weeks. Anatomy US scheduled for 05/25/23 at 1015.  Is patient a CenteringPregnancy candidate?  Not a Candidate due to HTN   Is patient a Mom+Baby Combined Care candidate?  Accepted  - explained will ask if she is a candidate and someone will let her know.  If accepted, confirm patient does not intend to move from the area for at least 12 months, then notify Mom+Baby staff  Interested in [place]?  Yes, sent referral and doula dot phrase.   Is patient a candidate for Babyscripts Optimization? No, due to HTN   First visit review I reviewed new OB appt with patient. Explained pt will be seen by Dr. Para March at first visit. Discussed Avelina Laine genetic screening with patient. She would like both Panorama and Horizon drawn with routine prenatal labs at new ob visit. Due to history of Diabetes which resolved with gastric sleeve surgery nurse offered and patiient accepted referral to  nutrition. She has concerns re: what to eat, weight gain/ nutrition. Appointment schedule next week.    Last Pap Diagnosis  Date Value Ref Range Status  02/02/2020   Final   - Negative for  intraepithelial lesion or malignancy (NILM)    Nancy Fetter 03/17/2023  4:04 PM

## 2023-03-17 NOTE — Patient Instructions (Signed)
 Options for Doula Care in the [place]  As you review your birthing options, consider having a birth doula. A doula is trained to provide support before, during and just after you give birth. There are also postpartum doulas that help you adjust to new parenthood.  While doulas do not provide medical care, they do provide emotional, physical and educational support. A few months before your baby arrives, doulas can help answer questions, ease concerns and help you create and support your birthing plan.    Doulas can help reduce your stress and comfort you and your partner. They can help you cope with labor by helping you use breathing techniques, massage, creative labor positioning, essential oils and affirmations.   Studies show that the benefits of having a doula include:   A more positive birth experience  Fewer requests for pain-relief medication  Less likelihood of cesarean section, commonly called a c-section   Doulas are typically hired via a Advertising account planner between you and the doula. We are happy to provide a list of the most active doulas in the area, all of whom are credentialed by [facility] and will not count as a visitor at your birth.  There are several options for no-cost doula care at our hospital, including:  [facility] Volunteer Doula Program Every [employer] Program A Cure 4 Moms Doula Study (available only at [employer] for Women, [place], [place] and [employer] offices)  For more information on these programs or to receive a list of doulas active in our area, please email [email or web address]

## 2023-03-19 ENCOUNTER — Ambulatory Visit: Payer: Managed Care, Other (non HMO)

## 2023-03-24 ENCOUNTER — Ambulatory Visit: Payer: Managed Care, Other (non HMO) | Admitting: Dietician

## 2023-03-24 ENCOUNTER — Encounter: Payer: Self-pay | Admitting: Obstetrics and Gynecology

## 2023-03-24 ENCOUNTER — Encounter: Attending: Obstetrics and Gynecology | Admitting: Dietician

## 2023-03-24 ENCOUNTER — Other Ambulatory Visit: Payer: Self-pay

## 2023-03-24 ENCOUNTER — Ambulatory Visit: Payer: Managed Care, Other (non HMO) | Admitting: Obstetrics and Gynecology

## 2023-03-24 ENCOUNTER — Other Ambulatory Visit (HOSPITAL_COMMUNITY)
Admission: RE | Admit: 2023-03-24 | Discharge: 2023-03-24 | Disposition: A | Discharge status: Home / Self Care | Source: Ambulatory Visit | Attending: Obstetrics and Gynecology | Admitting: Obstetrics and Gynecology

## 2023-03-24 VITALS — BP 135/88 | HR 71 | Wt 386.6 lb

## 2023-03-24 DIAGNOSIS — O10919 Unspecified pre-existing hypertension complicating pregnancy, unspecified trimester: Secondary | ICD-10-CM

## 2023-03-24 DIAGNOSIS — Z9884 Bariatric surgery status: Secondary | ICD-10-CM | POA: Insufficient documentation

## 2023-03-24 DIAGNOSIS — Z713 Dietary counseling and surveillance: Secondary | ICD-10-CM | POA: Insufficient documentation

## 2023-03-24 DIAGNOSIS — Z3A11 11 weeks gestation of pregnancy: Secondary | ICD-10-CM | POA: Diagnosis present

## 2023-03-24 DIAGNOSIS — O0990 Supervision of high risk pregnancy, unspecified, unspecified trimester: Secondary | ICD-10-CM | POA: Insufficient documentation

## 2023-03-24 DIAGNOSIS — Z8639 Personal history of other endocrine, nutritional and metabolic disease: Secondary | ICD-10-CM

## 2023-03-24 DIAGNOSIS — G4733 Obstructive sleep apnea (adult) (pediatric): Secondary | ICD-10-CM | POA: Diagnosis not present

## 2023-03-24 MED ORDER — ACCU-CHEK GUIDE TEST VI STRP: ORAL_STRIP | 12 refills | Status: DC

## 2023-03-24 MED ORDER — ACCU-CHEK GUIDE W/DEVICE KIT: 1.0000 | PACK | 0 refills | Status: DC | PRN

## 2023-03-24 MED ORDER — ASPIRIN 81 MG PO TBEC: 162.0000 mg | DELAYED_RELEASE_TABLET | Freq: Every day | ORAL | 12 refills | Status: DC

## 2023-03-24 MED ORDER — ACCU-CHEK SOFTCLIX LANCETS MISC: 12 refills | Status: DC

## 2023-03-24 NOTE — Patient Instructions (Addendum)
 Take 2 of your prenatal vitamin daily OR choose one of the Bariatric vitamins on your list. Take extra vitamin B-1 if you are vomiting.   Consider extra Vitamin B-6 for nausea. Start Calcium (take it away from your prenatal or bariatric vitamin) Restart your protein shake. Aim for a minimum of 80 grams protein per day   Focus on nutrient dense foods. Eat your protein first Small portion of carbohydrate with each meal Several small meals/snacks per day

## 2023-03-24 NOTE — Progress Notes (Signed)
 Medical Nutrition Therapy  Appointment Start time:  1055  Appointment End time:  1215 Patient is here today alone.  She was last seen by an RD at our office on 06/28/2020. Blood glucose this am was 176 after 2 clementines and a small bag of chips (tested in office).  Provided patient with an AccuChek Guide Me blood glucose monitor as she is not currently checking her blood glucose and does not have a blood glucose monitor.  Lot [id number]  Expiration 12/05/2023 Discussed the Dexcom G7 CGM and patient would like to try this.   Patient put the [facility] app on her phone and completed instruction regarding this device.  Applied the sensor to the back of her left arm but sensor did not apply correctly and came off.  No other sample was available.   Requested prescriptions for strips and lancets from staff as well as the Dexcom G7 sensors and probable prior authorization.  Primary concerns today: Patient has questions about pregnancy nutrition and adequacy post bariatric sleeve Referral diagnosis: history of Type 2 Diabetes which resolved after Bariatric sleeve Preferred learning style: no preference indicated Learning readiness: ready   NUTRITION ASSESSMENT  66" 382 lbs 03/05/2023 374 12/2022 510 lbs Highest weight 2022 prior to bariatric surgery  Gestation - Due date 10/10/2023 [redacted]w[redacted]d She is not getting enough protein in her diet and vitamin/mineral needs are increased due to history of bariatric surgery.  Clinical Medical Hx: HTN, Type 2 Diabetes (resolved after bariatric sleeve in 2022). Medications: amlodipine     Labs: A1C 6.1% 09/01/2021 Notable Signs/Symptoms: Patient states that she is feeling well except for nausea  Lifestyle & Dietary Hx Patient lives with her father.  She is doing the shopping and cooking.  This is her first pregnancy. She is not currently not working. She reports increased stress due to sciatic pain and is waiting on disability.   She does receive WIC.  Estimated daily  fluid intake: 150 oz - dislikes water and uses sugar free flavor packets Supplements: prenatal vitamin - she was not taking any vitamins prior to the pregnancy other than iron during her period.  She does not have money for additional vitamins currently. Sleep: insomnia after waking in the middle of the night - 1-2 hour naps during the day as she is not sleeping well at night Stress / self-care: high due to pain Current average weekly physical activity: none  24-Hr Dietary Recall Avoids added salt. Avoids frying Doesn't tolerate eggs  First Meal: cereal with milk Snack: 2 slices white bread, peanut butter, clementine Second Meal: cucumber salad, unsalted peanuts (2), fruit Snack: none Third Meal: cucumber salad, spaghetti Snack: none Beverages: flavored water (sugar free), juicy juice  Estimated Energy Needs Protein: >80g  NUTRITION DIAGNOSIS  NB-1.1 Food and nutrition-related knowledge deficit As related to balance of carbohydrates, protein, fat, and increased vitamin/mineral needs post bariatric surgery.  As evidenced by diet hx and patient report.   NUTRITION INTERVENTION  Nutrition education (E-1) on the following topics:  Insulin resistance will increase during pregnancy Nutrition goals to improve blood glucose Exercise effects on blood glucose Supplementation for post bariatric and pregnancy Protein needs for pregnancy How to balance a plate to obtain adequate nutrition Protein shake options Nutrient dense foods Carbohydrate need and best choices (whole grain, fresh fruit, greek yogurt, legumes, non-starchy vegetables) Need for several small meals per day Benefits of walking on blood glucose  Handouts Provided Include  Pre-existing diabetes during pregnancy Carb/protein snack list during pregnancy Glucose  log  Learning Style & Readiness for Change Teaching method utilized: Visual & Auditory  Demonstrated degree of understanding via: Teach Back  Barriers to  learning/adherence to lifestyle change: bariatric surgery history  Goals Established by Pt Take 2 of your prenatal vitamin daily OR choose one of the Bariatric vitamins on your list. Take extra vitamin B-1 if you are vomiting.   Consider extra Vitamin B-6 for nausea. Start Calcium (take it away from your prenatal or bariatric vitamin) Restart your protein shake. Aim for a minimum of 80 grams protein per day   Focus on nutrient dense foods. Eat your protein first Small portion of carbohydrate with each meal Several small meals/snacks per day   MONITORING & EVALUATION Dietary intake, weekly physical activity  in  1 month.  Next Steps  Patient is to call for questions.

## 2023-03-24 NOTE — Progress Notes (Signed)
 History:   Laura Benson is a 33 y.o. G1P0 at [redacted]w[redacted]d by LMP and 8 wk [place] being seen today for her first obstetrical visit.   Patient does intend to breast feed.   Patient reports no complaints.    HISTORY: OB History  Gravida Para Term Preterm AB Living  1 0 0 0 0 0  SAB IAB Ectopic Multiple Live Births  0 0 0 0 0    # Outcome Date GA Lbr Len/2nd Weight Sex Type Anes PTL Lv  1 Current              Lab Results  Component Value Date   DIAGPAP  02/02/2020    - Negative for intraepithelial lesion or malignancy (NILM)     Past Medical History:  Diagnosis Date   Anxiety    Asthma    Complication of anesthesia    Take for a while to wake up   Depression    Diabetes mellitus without complication (HCC)    Hypertension    Morbid obesity (HCC)    PCOS (polycystic ovarian syndrome)    Sleep apnea    CPAP   Tachycardia    Past Surgical History:  Procedure Laterality Date   ADENOIDECTOMY     LAPAROSCOPIC GASTRIC SLEEVE RESECTION N/A 04/16/2020   Procedure: LAPAROSCOPIC GASTRIC SLEEVE RESECTION;  Surgeon: [name], MD;  Location: [facility];  Service: General;  Laterality: N/A;   TONSILLECTOMY     UPPER GI ENDOSCOPY N/A 04/16/2020   Procedure: UPPER GI ENDOSCOPY;  Surgeon: [name], MD;  Location: [facility];  Service: General;  Laterality: N/A;   Family History  Problem Relation Age of Onset   Stroke Mother    Hypertension Mother    Diabetes Mother    Hypertension Father    Diabetes Father    Colon cancer Father    Diabetes Sister    Hypertension Maternal Grandmother    Hypertension Paternal Grandmother    Diabetes Paternal Grandmother    Breast cancer Cousin    Social History   Tobacco Use   Smoking status: Former    Types: Cigarettes   Smokeless tobacco: Never  Vaping Use   Vaping status: Never Used  Substance Use Topics   Alcohol use: No   Drug use: Never   No Known Allergies Current Outpatient Medications on File Prior to Visit  Medication Sig  Dispense Refill   acetaminophen-caffeine (EXCEDRIN TENSION HEADACHE) 500-65 MG TABS per tablet Take 2 tablets by mouth every 8 (eight) hours as needed. 60 tablet 0   albuterol (VENTOLIN HFA) 108 (90 Base) MCG/ACT inhaler Inhale 2 puffs into the lungs every 6 (six) hours as needed for wheezing or shortness of breath. 6.7 g 0   amLODipine (NORVASC) 10 MG tablet Take 1 tablet (10 mg total) by mouth daily. 30 tablet 0   fluticasone (FLONASE) 50 MCG/ACT nasal spray Place 2 sprays into both nostrils daily. 9.9 mL 0   Prenatal Vit-Fe Fumarate-FA (PRENATAL VITAMINS PO) Take 1 tablet by mouth daily at 6 (six) AM.     promethazine (PHENERGAN) 25 MG tablet Take 1 tablet (25 mg total) by mouth every 6 (six) hours as needed for nausea or vomiting. 30 tablet 0   hydrOXYzine (ATARAX) 25 MG tablet Take 1 tablet (25 mg total) by mouth 3 (three) times daily as needed for anxiety. (Patient not taking: Reported on 03/24/2023) 30 tablet 3   tiZANidine (ZANAFLEX) 4 MG tablet Take 1 tablet (4 mg total) by  mouth every 8 (eight) hours as needed for muscle spasms. Do not take with alcohol or while driving or operating heavy machinery.  May cause drowsiness. (Patient not taking: Reported on 03/24/2023) 30 tablet 0   No current facility-administered medications on file prior to visit.    Review of Systems Pertinent items noted in HPI and remainder of comprehensive ROS otherwise negative.  Physical Exam:   Vitals:   03/24/23 1501  BP: (!) 143/65  Pulse: 89  Weight: (!) 386 lb 9.6 oz (175.4 kg)     Bedside Ultrasound for FHR check: Viable intrauterine pregnancy with positive cardiac activity noted.   Patient informed that the ultrasound is considered a limited obstetric ultrasound and is not intended to be a complete ultrasound exam.  Patient also informed that the ultrasound is not being completed with the intent of assessing for fetal or placental anomalies or any pelvic abnormalities.  Explained that the purpose of  today's ultrasound is to assess for fetal heart rate.  Patient acknowledges the purpose of the exam and the limitations of the study.  General: well-developed, well-nourished female in no acute distress  Breasts:  normal appearance, no masses or tenderness bilaterally  Skin: normal coloration and turgor, no rashes  Neurologic: oriented, normal, negative, normal mood  Extremities: normal strength, tone, and muscle mass, ROM of all joints is normal  HEENT PERRLA, extraocular movement intact and sclera clear, anicteric  Neck supple and no masses  Cardiovascular: regular rate and rhythm  Respiratory:  no respiratory distress, normal breath sounds  Abdomen: soft, non-tender; bowel sounds normal; no masses,  no organomegaly  Pelvic: normal external genitalia, no lesions, normal vaginal mucosa, normal vaginal discharge, normal cervix, pap smear done. Uterine size:  aga    Assessment:    Pregnancy: G1P0 Patient Active Problem List   Diagnosis Date Noted   Supervision of high risk pregnancy, antepartum 03/17/2023   History of diabetes mellitus resolved following bariatric surgery 03/17/2023   Morbid obesity (HCC) 03/17/2023   MDD (major depressive disorder), recurrent, severe, with psychosis (HCC) 08/31/2021   GAD (generalized anxiety disorder) 10/23/2020   Complication of anesthesia 09/04/2020   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   Lumbar radiculopathy 08/17/2019   Type 2 diabetes mellitus with hyperglycemia, without long-term current use of insulin (HCC) 01/18/2019   Anxiety 12/31/2018   Asthma 06/08/2018   Alcohol abuse 04/30/2017   Umbilical hernia without obstruction and without gangrene 02/25/2017   Obstructive sleep apnea 12/11/2016   Diabetes mellitus without complication (HCC) 01/23/2016   Chronic hypertension in pregnancy 01/23/2016   Depression, recurrent (HCC) 01/05/2009   Severe obesity (BMI >= 40) (HCC) 03/26/2006   HYPERTENSION, BENIGN SYSTEMIC 03/26/2006      Plan:    1. Obstructive sleep apnea (Primary) Used to have headaches when waking before bypass. This has resolved. Denies symptoms of OSA now.   2. Supervision of high risk pregnancy, antepartum Initial labs ordered. Also will do bypass labs.  Continue prenatal vitamins. Problem list reviewed and updated. Genetic Screening discussed, NIPS: ordered. Ultrasound discussed; fetal anatomic survey: ordered. Anticipatory guidance about prenatal visits given including labs, ultrasounds, and testing. Discussed usage of Babyscripts and virtual visits  3. Morbid obesity (HCC)   4. History of diabetes mellitus resolved following bariatric surgery S/p appt with DM educator. Dexcom would not insert. Plan is for CBGs 4x a day for a month. If all normal, could go to every other day.   5. Pregnancy with 11 completed weeks gestation  6. Chronic hypertension in pregnancy Amlodipine  BP mild range.  Baseline labs.  Discussed ldASA at 12 weeks.    The nature of [place] [facility]ulty Practice with multiple MDs and Advanced Practice Providers was explained to patient; also emphasized that residents, students are part of our team. Routine obstetric precautions reviewed. Encouraged to seek out care at office or emergency room [facility] preferred) for urgent and/or emergent concerns. Return in about 4 weeks (around 04/21/2023) for [facility]    Milas Hock, MD, FACOG Obstetrician & Gynecologist, [facility] for [facility], [facility]

## 2023-03-26 ENCOUNTER — Encounter: Payer: Managed Care, Other (non HMO) | Admitting: Nurse Practitioner

## 2023-03-26 LAB — CULTURE, OB URINE

## 2023-03-26 LAB — URINE CULTURE, OB REFLEX

## 2023-03-30 LAB — CYTOLOGY - PAP
Chlamydia: NEGATIVE
Comment: NEGATIVE
Comment: NEGATIVE
Comment: NORMAL
Diagnosis: NEGATIVE
High risk HPV: NEGATIVE
Neisseria Gonorrhea: NEGATIVE

## 2023-03-31 ENCOUNTER — Encounter: Payer: Self-pay | Admitting: Obstetrics and Gynecology

## 2023-03-31 LAB — PANORAMA PRENATAL TEST FULL PANEL:PANORAMA TEST PLUS 5 ADDITIONAL MICRODELETIONS: FETAL FRACTION: 2

## 2023-04-01 ENCOUNTER — Encounter: Payer: Self-pay | Admitting: Obstetrics and Gynecology

## 2023-04-01 ENCOUNTER — Other Ambulatory Visit: Payer: Self-pay | Admitting: Obstetrics and Gynecology

## 2023-04-01 DIAGNOSIS — R768 Other specified abnormal immunological findings in serum: Secondary | ICD-10-CM | POA: Insufficient documentation

## 2023-04-01 LAB — CBC/D/PLT+RPR+RH+ABO+RUBIGG...
Basophils Absolute: 0 10*3/uL (ref 0.0–0.2)
Basos: 1 %
EOS (ABSOLUTE): 0.1 10*3/uL (ref 0.0–0.4)
Eos: 1 %
HCV Ab: NONREACTIVE
HIV Screen 4th Generation wRfx: NONREACTIVE
Hematocrit: 38.5 % (ref 34.0–46.6)
Hemoglobin: 11.8 g/dL (ref 11.1–15.9)
Hepatitis B Surface Ag: NEGATIVE
Immature Grans (Abs): 0 10*3/uL (ref 0.0–0.1)
Immature Granulocytes: 0 %
Lymphocytes Absolute: 1.5 10*3/uL (ref 0.7–3.1)
Lymphs: 25 %
MCH: 22.4 pg — ABNORMAL LOW (ref 26.6–33.0)
MCHC: 30.6 g/dL — ABNORMAL LOW (ref 31.5–35.7)
MCV: 73 fL — ABNORMAL LOW (ref 79–97)
Monocytes Absolute: 0.6 10*3/uL (ref 0.1–0.9)
Monocytes: 10 %
Neutrophils Absolute: 3.7 10*3/uL (ref 1.4–7.0)
Neutrophils: 63 %
Platelets: 272 10*3/uL (ref 150–450)
RBC: 5.27 x10E6/uL (ref 3.77–5.28)
RDW: 19.9 % — ABNORMAL HIGH (ref 11.7–15.4)
RPR Ser Ql: NONREACTIVE
Rh Factor: POSITIVE
Rubella Antibodies, IGG: 7.93 {index} (ref >0.99)
WBC: 5.9 10*3/uL (ref 3.4–10.8)

## 2023-04-01 LAB — FOLATE: Folate: >20 ng/mL (ref >3.0)

## 2023-04-01 LAB — COMPREHENSIVE METABOLIC PANEL
ALT: 11 IU/L (ref 0–32)
AST: 12 IU/L (ref 0–40)
Albumin: 4 g/dL (ref 3.9–4.9)
Alkaline Phosphatase: 52 IU/L (ref 44–121)
BUN/Creatinine Ratio: 10 (ref 9–23)
BUN: 8 mg/dL (ref 6–20)
Bilirubin Total: 0.4 mg/dL (ref 0.0–1.2)
CO2: 20 mmol/L (ref 20–29)
Calcium: 9.4 mg/dL (ref 8.7–10.2)
Chloride: 100 mmol/L (ref 96–106)
Creatinine, Ser: 0.83 mg/dL (ref 0.57–1.00)
Globulin, Total: 3.1 g/dL (ref 1.5–4.5)
Glucose: 136 mg/dL — ABNORMAL HIGH (ref 70–99)
Potassium: 4.2 mmol/L (ref 3.5–5.2)
Sodium: 135 mmol/L (ref 134–144)
Total Protein: 7.1 g/dL (ref 6.0–8.5)
eGFR: 95 mL/min/{1.73_m2} (ref >59)

## 2023-04-01 LAB — VITAMIN B1: Thiamine: 108.3 nmol/L (ref 66.5–200.0)

## 2023-04-01 LAB — HCV INTERPRETATION

## 2023-04-01 LAB — PROTEIN / CREATININE RATIO, URINE
Creatinine, Urine: 422.7 mg/dL
Protein, Ur: 24.5 mg/dL
Protein/Creat Ratio: 58 mg/g{creat} (ref 0–200)

## 2023-04-01 LAB — HEMOGLOBIN A1C
Est. average glucose Bld gHb Est-mCnc: 131 mg/dL
Hgb A1c MFr Bld: 6.2 % — ABNORMAL HIGH (ref 4.8–5.6)

## 2023-04-01 LAB — FERRITIN: Ferritin: 22 ng/mL (ref 15–150)

## 2023-04-01 LAB — VITAMIN B12: Vitamin B-12: 390 pg/mL (ref 232–1245)

## 2023-04-01 LAB — AB SCR+ANTIBODY ID: Antibody Screen: POSITIVE — AB

## 2023-04-01 LAB — IRON: Iron: 125 ug/dL (ref 27–159)

## 2023-04-01 LAB — VITAMIN D 25 HYDROXY (VIT D DEFICIENCY, FRACTURES): Vit D, 25-Hydroxy: 20.9 ng/mL — ABNORMAL LOW (ref 30.0–100.0)

## 2023-04-02 ENCOUNTER — Encounter: Payer: Self-pay | Admitting: Obstetrics and Gynecology

## 2023-04-03 DIAGNOSIS — Z0289 Encounter for other administrative examinations: Secondary | ICD-10-CM

## 2023-04-06 LAB — HORIZON CUSTOM: REPORT SUMMARY: POSITIVE — AB

## 2023-04-08 ENCOUNTER — Encounter: Payer: Self-pay | Admitting: Obstetrics and Gynecology

## 2023-04-08 DIAGNOSIS — D563 Thalassemia minor: Secondary | ICD-10-CM | POA: Insufficient documentation

## 2023-04-15 ENCOUNTER — Other Ambulatory Visit: Payer: Self-pay

## 2023-04-15 DIAGNOSIS — E1165 Type 2 diabetes mellitus with hyperglycemia: Secondary | ICD-10-CM

## 2023-04-21 ENCOUNTER — Ambulatory Visit: Admitting: Dietician

## 2023-04-21 ENCOUNTER — Encounter: Attending: Obstetrics and Gynecology | Admitting: Dietician

## 2023-04-21 ENCOUNTER — Other Ambulatory Visit: Payer: Self-pay | Admitting: Family Medicine

## 2023-04-21 ENCOUNTER — Other Ambulatory Visit: Payer: Self-pay

## 2023-04-21 ENCOUNTER — Ambulatory Visit: Payer: Managed Care, Other (non HMO) | Admitting: Dietician

## 2023-04-21 ENCOUNTER — Ambulatory Visit: Payer: Managed Care, Other (non HMO) | Admitting: Family Medicine

## 2023-04-21 VITALS — BP 150/89 | HR 102 | Wt >= 6400 oz

## 2023-04-21 DIAGNOSIS — Z713 Dietary counseling and surveillance: Secondary | ICD-10-CM | POA: Diagnosis present

## 2023-04-21 DIAGNOSIS — F411 Generalized anxiety disorder: Secondary | ICD-10-CM

## 2023-04-21 DIAGNOSIS — O0992 Supervision of high risk pregnancy, unspecified, second trimester: Secondary | ICD-10-CM | POA: Diagnosis not present

## 2023-04-21 DIAGNOSIS — O10912 Unspecified pre-existing hypertension complicating pregnancy, second trimester: Secondary | ICD-10-CM | POA: Diagnosis not present

## 2023-04-21 DIAGNOSIS — Z3A15 15 weeks gestation of pregnancy: Secondary | ICD-10-CM

## 2023-04-21 DIAGNOSIS — O0990 Supervision of high risk pregnancy, unspecified, unspecified trimester: Secondary | ICD-10-CM | POA: Diagnosis not present

## 2023-04-21 DIAGNOSIS — O24112 Pre-existing diabetes mellitus, type 2, in pregnancy, second trimester: Secondary | ICD-10-CM

## 2023-04-21 DIAGNOSIS — O10919 Unspecified pre-existing hypertension complicating pregnancy, unspecified trimester: Secondary | ICD-10-CM

## 2023-04-21 DIAGNOSIS — E1165 Type 2 diabetes mellitus with hyperglycemia: Secondary | ICD-10-CM

## 2023-04-21 DIAGNOSIS — Z8639 Personal history of other endocrine, nutritional and metabolic disease: Secondary | ICD-10-CM | POA: Insufficient documentation

## 2023-04-21 DIAGNOSIS — Z9884 Bariatric surgery status: Secondary | ICD-10-CM | POA: Diagnosis not present

## 2023-04-21 DIAGNOSIS — Z3402 Encounter for supervision of normal first pregnancy, second trimester: Secondary | ICD-10-CM

## 2023-04-21 DIAGNOSIS — F339 Major depressive disorder, recurrent, unspecified: Secondary | ICD-10-CM

## 2023-04-21 MED ORDER — LABETALOL HCL 200 MG PO TABS: 200.0000 mg | ORAL_TABLET | Freq: Two times a day (BID) | ORAL | 3 refills | Status: DC

## 2023-04-21 NOTE — Progress Notes (Signed)
   PRENATAL VISIT NOTE  Subjective:  Laura Benson is a 33 y.o. G1P0 at [redacted]w[redacted]d being seen today for ongoing prenatal care.  She is currently monitored for the following issues for this high-risk pregnancy and has Severe obesity (BMI >= 40) (HCC); Depression, recurrent (HCC); HYPERTENSION, BENIGN SYSTEMIC; Diabetes mellitus without complication (HCC); Chronic hypertension in pregnancy; Obstructive sleep apnea; Umbilical hernia without obstruction and without gangrene; Alcohol abuse; Asthma; Type 2 diabetes mellitus with hyperglycemia, without long-term current use of insulin (HCC); S/P laparoscopic sleeve gastrectomy; Anxiety; Lumbar radiculopathy; Complication of anesthesia; GAD (generalized anxiety disorder); MDD (major depressive disorder), recurrent, severe, with psychosis (HCC); Supervision of high risk pregnancy, antepartum; History of diabetes mellitus resolved following bariatric surgery; Morbid obesity (HCC); Red blood cell antibody positive; and Alpha thalassemia silent carrier on their problem list.  Patient reports no bleeding, no contractions, no cramping, and no leaking.  Contractions: Not present. Vag. Bleeding: None.  Movement: Present. Denies leaking of fluid.   The following portions of the patient's history were reviewed and updated as appropriate: allergies, current medications, past family history, past medical history, past social history, past surgical history and problem list.   Objective:   Vitals:   04/21/23 1420 04/21/23 1449  BP: (!) 164/97 (!) 150/89  Pulse: (!) 102   Weight: (!) 404 lb 5 oz (183.4 kg)     Fetal Status: Fetal Heart Rate (bpm): 151   Movement: Present     General:  Alert, oriented and cooperative. Patient is in no acute distress.  Skin: Skin is warm and dry. No rash noted.   Cardiovascular: Normal heart rate noted  Respiratory: Normal respiratory effort, no problems with respiration noted  Abdomen: Soft, gravid, appropriate for gestational age.   Pain/Pressure: Absent     Pelvic: Cervical exam deferred        Extremities: Normal range of motion.  Edema: None  Mental Status: Normal mood and affect. Normal behavior. Normal judgment and thought content.   Assessment and Plan:  Pregnancy: G1P0 at [redacted]w[redacted]d 1. Supervision of high risk pregnancy, antepartum (Primary) FHR appropriate - AFP, Serum, Open Spina Bifida - Antibody screen  2. Chronic hypertension in pregnancy Initial BP elevated at 160s/90s.  Repeat 150/89.  On amlodipine 10 mg daily.  Started labetalol 200 mg twice daily.  Patient will monitor blood pressures at home.  We will titrate labetalol as able.  3. Pregnancy with type 2 diabetes mellitus in second trimester Patient saw nutrition today and is going to work on changing her diet.  Reports fasting blood sugars all less than 95, postprandials at breakfast and lunch are less than 120 but the dinners are sometimes over 120.  4. Severe obesity (BMI >= 40) (HCC) History of gastric sleeve On ASA 81 mg twice daily Following with MFM  5. [redacted] weeks gestation of pregnancy  6. Encounter for supervision of normal first pregnancy in second trimester - PANORAMA PRENATAL TEST  Preterm labor symptoms and general obstetric precautions including but not limited to vaginal bleeding, contractions, leaking of fluid and fetal movement were reviewed in detail with the patient. Please refer to After Visit Summary for other counseling recommendations.   No follow-ups on file.  Future Appointments  Date Time Provider Department Center  05/20/2023  1:55 PM Madelina, Sanda, MD [facility]  05/25/2023 10:15 AM [facility] NURSE [facility]  05/25/2023 10:30 AM [facility] US3 [facility]  05/25/2023 11:00 AM [facility] PROVIDER 1 [facility]    Celedonio Savage, MD

## 2023-04-21 NOTE — Addendum Note (Signed)
 Addended by: Celedonio Savage on: 04/21/2023 03:24 PM   Modules accepted: Orders

## 2023-04-21 NOTE — Progress Notes (Signed)
 Medical Nutrition Therapy  Appointment Start time:  1140  Appointment End time:  1210 Patient is here today alone.  She was last seen by an RD at our office on 03/24/2023.  She forgot her blood glucose log this am. Fasting blood glucose 90 this am and usual Post meal 116, 119, 107 yesterday.  Occasional highs after cake and ice cream. Instructed patient to ask about changing some WIC dollars to foods that she should eat (instead of juice, would they cover nuts or greek  yogurt?) She states that she is taking her vitamins but needs to start Vitamin D. Protein intake and quality of meals has improved.  She is taking a prenatal vitamin daily and calcium.  Primary concerns today: Patient has questions about pregnancy nutrition and adequacy post bariatric sleeve - she asks if she can decrease her blood glucose testing due to A1C of 6.2% and mostly normal (per patient) glucose readings.  Advised her to continue testing as MD requests.  Numbers may likely increase with increasing insulin resistance during pregnancy.  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 Medications: amlodipine     Labs: A1C 6.2% 03/24/2023, Vitamin D 20 03/24/2023 Notable Signs/Symptoms: Patient states that she is feeling well except for nausea  Lifestyle & Dietary Hx Patient lives with her father.  She is doing the shopping and cooking.  This is her first pregnancy. She works at [employer]. She reports increased stress due to sciatic pain and is waiting on disability.   She does receive WIC.  Estimated daily fluid intake: 150 oz - dislikes  water and uses sugar free flavor packets Supplements: prenatal vitamin - she was not taking any vitamins prior to the pregnancy other than iron during her period.  She does not have money for additional vitamins currently. Sleep: insomnia after waking in the middle of the night - 1-2 hour naps during the day as she is not sleeping well at night Stress / self-care: high due to pain Current average weekly physical activity: increased amounts >10,000 steps at work  24-Hr Dietary Recall Avoids added salt. Avoids frying Doesn't tolerate eggs Cooking her own meals now and avoiding eating out.    First Meal: 1 packet oatmeal (instant) with Lactaid milk Snack: none Second Meal: protein shake,  PB & J sandwich on [employer] Snack: cheese stick, beef stick, Third Meal: greens, mashed potatoes, steak burger with instant gravy Snack: none Beverages: water, flavored water  Estimated Energy Needs Protein: >80g  NUTRITION DIAGNOSIS  NB-1.1 Food and nutrition-related knowledge deficit As related to balance of carbohydrates, protein, fat, and increased vitamin/mineral needs post bariatric surgery.  As evidenced by diet hx and patient report.   NUTRITION INTERVENTION initial and continued Nutrition education (E-1) on the following topics:  Insulin resistance will increase during pregnancy Nutrition goals to improve blood glucose Exercise effects on blood glucose Supplementation for post bariatric and pregnancy Protein needs for pregnancy How to balance a plate to obtain adequate nutrition Protein shake options Nutrient dense foods Carbohydrate need and best choices (whole grain, fresh fruit, greek yogurt, legumes, non-starchy vegetables) Need for several small meals per day  Handouts Provided Include -  initial visit 02/2023 Pre-existing diabetes during pregnancy Carb/protein snack list during pregnancy Glucose log  Learning Style & Readiness for Change Teaching method utilized: Visual & Auditory   Demonstrated degree of understanding via: Teach Back  Barriers to learning/adherence to lifestyle change: bariatric surgery history  Goals Established by Pt Take 2 of your prenatal vitamin daily OR choose one of the Bariatric vitamins on your list. Continue Calcium (take it away from your prenatal or bariatric vitamin) Begin a vitamin D3 supplement Restart your protein shake. Aim for a minimum of 80 grams protein per day   Focus on nutrient dense foods. Eat your protein first Small portion of carbohydrate with each meal Several small meals/snacks per day   MONITORING & EVALUATION Dietary intake, weekly physical activity  in  1-2 months.  Next Steps  Patient is to call for questions.

## 2023-04-23 LAB — AFP, SERUM, OPEN SPINA BIFIDA
AFP MoM: 0.51
AFP Value: 10.2 ng/mL
Gest. Age on Collection Date: 15 wk
Maternal Age At EDD: 33.8 a
OSBR Risk 1 IN: 10000
Test Results:: NEGATIVE
Weight: 404 [lb_av]

## 2023-04-23 LAB — ANTIBODY SCREEN: Antibody Screen: NEGATIVE

## 2023-04-27 ENCOUNTER — Other Ambulatory Visit: Payer: Self-pay

## 2023-04-27 ENCOUNTER — Inpatient Hospital Stay (HOSPITAL_COMMUNITY)
Admission: AD | Admit: 2023-04-27 | Discharge: 2023-04-27 | Disposition: A | Discharge status: Home / Self Care | Attending: Obstetrics and Gynecology | Admitting: Obstetrics and Gynecology

## 2023-04-27 ENCOUNTER — Encounter (HOSPITAL_COMMUNITY): Payer: Self-pay | Admitting: Obstetrics and Gynecology

## 2023-04-27 DIAGNOSIS — Z6841 Body Mass Index (BMI) 40.0 and over, adult: Secondary | ICD-10-CM

## 2023-04-27 DIAGNOSIS — O26892 Other specified pregnancy related conditions, second trimester: Secondary | ICD-10-CM | POA: Insufficient documentation

## 2023-04-27 DIAGNOSIS — R102 Pelvic and perineal pain: Secondary | ICD-10-CM | POA: Diagnosis not present

## 2023-04-27 DIAGNOSIS — O99212 Obesity complicating pregnancy, second trimester: Secondary | ICD-10-CM | POA: Insufficient documentation

## 2023-04-27 DIAGNOSIS — O0992 Supervision of high risk pregnancy, unspecified, second trimester: Secondary | ICD-10-CM | POA: Insufficient documentation

## 2023-04-27 DIAGNOSIS — N949 Unspecified condition associated with female genital organs and menstrual cycle: Secondary | ICD-10-CM

## 2023-04-27 DIAGNOSIS — O0990 Supervision of high risk pregnancy, unspecified, unspecified trimester: Secondary | ICD-10-CM

## 2023-04-27 DIAGNOSIS — R1032 Left lower quadrant pain: Secondary | ICD-10-CM | POA: Insufficient documentation

## 2023-04-27 DIAGNOSIS — O10012 Pre-existing essential hypertension complicating pregnancy, second trimester: Secondary | ICD-10-CM | POA: Diagnosis present

## 2023-04-27 DIAGNOSIS — Z3A16 16 weeks gestation of pregnancy: Secondary | ICD-10-CM | POA: Diagnosis not present

## 2023-04-27 DIAGNOSIS — Z8639 Personal history of other endocrine, nutritional and metabolic disease: Secondary | ICD-10-CM

## 2023-04-27 LAB — URINALYSIS, ROUTINE W REFLEX MICROSCOPIC
Bilirubin Urine: NEGATIVE
Glucose, UA: NEGATIVE mg/dL
Hgb urine dipstick: NEGATIVE
Ketones, ur: NEGATIVE mg/dL
Leukocytes,Ua: NEGATIVE
Nitrite: NEGATIVE
Protein, ur: NEGATIVE mg/dL
Specific Gravity, Urine: 1.014 (ref 1.005–1.030)
pH: 6 (ref 5.0–8.0)

## 2023-04-27 LAB — PANORAMA PRENATAL TEST FULL PANEL:PANORAMA TEST PLUS 5 ADDITIONAL MICRODELETIONS: FETAL FRACTION: 2.1

## 2023-04-27 NOTE — MAU Note (Signed)
 Laura Benson is a 33 y.o. at [redacted]w[redacted]d here in MAU reporting: she's had a sharp intermittent pain located on the left lateral side since Saturday.  Also states BP was elevated last night and has been having headaches, endorses current slight HA.  Reports last took Excedrin last night @ 2230. Denies VB.  LMP: 01/03/2023 Onset of complaint: Saturday Pain score: 5 HA & * abd pain Vitals:   04/27/23 0715  BP: (!) 147/90  Pulse: 74  Resp: 19  Temp: 98.9 F (37.2 C)  SpO2: 99%     FHT: deferred, will do once in room   Lab orders placed from triage: UA

## 2023-04-27 NOTE — Discharge Instructions (Addendum)
Commonly Asked Questions During Pregnancy  How Will I Feel When I'm Pregnant? Pregnancy symptoms in the first trimester of pregnancy may not appear until the middle or end of the second month. Hormonal changes will cause tenderness in your breasts, and you may begin to feel more tired than usual. Food cravings, an increase in the need to urinate, and morning sickness may all be more noticeable.  Pregnancy symptoms in the second trimester are more prominent. You may start to feel the baby move and become more active. Dental issues, nasal/sinus problems, and skin irritations can begin to appear. Heartburn, leg cramps, dizziness, and a vaginal discharge are also common. Every woman is different when it comes to the symptoms they experience, and some may not experience any at all. Pregnancy symptoms in the third trimester can include increased frequency in urination, leg cramps, constipation, ligament pain in the abdomen, and weight gain. Back pain and Braxton Hicks contractions will become increasingly more common.  Why is nutrition during pregnancy important? Eating well is one of the best things you can do during pregnancy. Good nutrition helps you handle the extra demands on your body as your pregnancy progresses. The goal is to balance getting enough nutrients to support the growth of your fetus and maintaining a healthy weight.  How much water should I drink during pregnancy? During pregnancy you should drink 8 to 12 cups (64 to 96 ounces) of water every day. Water has many benefits. It aids digestion and helps form the amniotic fluid around the fetus. Water also helps nutrients circulate in the body and helps waste leave the body.  What can I do to help with nausea? Eat dry toast or crackers in the morning before you get out of bed to avoid moving around on an empty stomach. Eat five or six "mini meals" a day to ensure that your stomach is never empty. Eat frequent bites of foods like nuts,  fruits, or crackers.  What can help with constipation during pregnancy? Constipation is common near the end of pregnancy. Eating more foods with fiber can help fight constipation. Fiber is found in fruits, vegetables, whole grains, beans, nuts, and seeds. You should aim for about 25 grams of fiber in your diet each day. Drink a lot of water as you increase your fiber intake.  How much coffee can I drink while I'm pregnant? Research suggests that moderate caffeine consumption (less than 200 milligrams per day) does not cause miscarriage or preterm birth. That's the amount in one 12-ounce cup of coffee. Remember that caffeine also is found in tea, chocolate, energy drinks, and soft drinks. Caffeine can interfere with sleep and contribute to nausea and light-headedness. Caffeine also can increase urination and lead to dehydration.  What can I do to prevent or ease back pain during pregnancy? There are several things you can do to prevent or ease back pain. For example, wear supportive clothing and shoes. Pay attention to your position when sitting, sleeping, and lifting things. If you need to stand for a long time, rest one foot on a stool or a box to take the strain off your back. You also can use heat or cold to soothe sore muscles.  Is it safe to exercise during pregnancy? If you are healthy and your pregnancy is normal, it is safe to continue or start regular physical activity. Physical activity does not increase your risk of miscarriage, low birth weight, or early delivery. It's still important to discuss exercise with your ob-gyn  provider during your early prenatal visits.   What are the benefits of exercise during pregnancy? Regular exercise during pregnancy benefits you and your fetus in these key ways: Reduces back pain Eases constipation May decrease your risk of gestational diabetes, preeclampsia, and cesarean birth Promotes healthy weight gain during pregnancy Improves your overall  fitness and strengthens your heart and blood vessels Helps you to lose the baby weight after your baby is born  Is it safe to dye my hair during pregnancy? Yes, it's safe. Only a small amount of chemicals from hair dye is absorbed through the scalp.  Is it safe to keep a cat during pregnancy? Yes, you can keep your cat. You may have heard that cat feces can carry the infection toxoplasmosis. This infection is only found in cats who go outdoors and hunt prey, such as mice and other rodents. If you do have a cat who goes outdoors or eats prey, have someone else take over daily cleaning the litter box. This will keep you away from any cat feces. If you have an indoor cat who only eats cat food and doesn't have contact with outside animals, your risk of toxoplasmosis is very low.  What substances should I avoid during pregnancy? During pregnancy, women should not use tobacco, alcohol, marijuana, illegal drugs, or prescription medications for nonmedical reasons. Avoiding these substances and getting regular prenatal care are important to having a healthy pregnancy and a healthy baby.   What foods to I need to avoid in pregnancy? To help prevent listeriosis, avoid eating the following foods while you are pregnant: Unpasteurized milk and foods made with unpasteurized milk, including soft cheeses Hot dogs and luncheon meats, unless they are heated until steaming hot just before serving Unwashed raw produce such as fruits and vegetables  Avoid all raw and undercooked seafood, eggs, meat, and poultry while you are pregnant. Do not eat sushi made with raw fish (cooked sushi is safe). Cooking and pasteurization are the only ways to kill Listeria.  Limit your exposure to mercury by not eating bigeye tuna, king mackerel, marlin, orange roughy, shark, swordfish, or tilefish. Limit eating white (albacore) tuna to 6 ounces a week. You do not have to avoid all fish during pregnancy. In fact, fish and shellfish are  nutritious foods with vital nutrients for a pregnant woman and her fetus. Be sure to eat at least 8-12 ounces of low-mercury fish and shellfish per week.  Is travel safe to during pregnancy? In most cases, pregnant women can travel safely until close to their due dates. But travel may not be recommended for women who have pregnancy complications. If you are planning a trip, talk with your (ob-gyn) provider. And no matter how you choose to travel, think ahead about your comfort and safety.  Can I use a sauna or hot tub early in pregnancy? It's best not to. Your core body temperature rises when you use saunas and hot tubs. This rise in temperature can be harmful for your fetus.  Can I get a massage while pregnant? Yes. Massage is a good way to relax and improve circulation. The best position for a massage while you're pregnant is lying on your side, rather than facedown. Some massage tables have a cut-out for the belly, allowing you to lie facedown comfortably. Tell your massage therapist that you're pregnant if you're not showing yet. Many health spas offer special prenatal massages done by therapists who are trained to work on pregnant women.  Is Having Dental  Work While Pregnant Safe? Pregnancy and dental work questions are common for expecting moms. Preventive dental cleanings and annual exams during pregnancy are not only safe but are recommended. The rise in hormone levels during pregnancy causes the gums to swell, bleed, and trap food causing increased irritation to your gums. Preventive dental work while pregnant is essential to avoid oral infections such as gum disease, which has been linked to preterm birth. The American Dental Association (ADA) recommends pregnant women eat a balanced diet, brush their teeth thoroughly with ADA-approved fluoride toothpaste twice a day, and floss daily. Have preventive exams and cleanings during your pregnancy. Let your dentist know you are  pregnant. Postpone non-emergency dental work until the second trimester or after delivery, if possible. Elective procedures should be postponed until after the delivery.  

## 2023-04-27 NOTE — MAU Provider Note (Signed)
 Chief Complaint:  Abdominal Pain and Headache   HPI    Laura Benson is a 33 y.o. G1P0 at [redacted]w[redacted]d who presents to maternity admissions reporting c/o left sided lower abdominal pain intermittently since Saturday.   Denies any LOF, VB, and reports that she has no GI/GU symptoms. Denies any vaginal discharge or dysuria. No N/V/D and offers no c/o fever, chills. She does have a h/o hypertension and takes her medications as prescribed and Excedrin as needed for HA. She reported a mild HA this AM but took Excedrin and it is now resolved.   Pregnancy Course: [facility]   Past Medical History:  Diagnosis Date   Anxiety    Asthma    Complication of anesthesia    Take for a while to wake up   Depression    Diabetes mellitus without complication (HCC)    Hypertension    Morbid obesity (HCC)    PCOS (polycystic ovarian syndrome)    Sleep apnea    CPAP   Tachycardia    OB History  Gravida Para Term Preterm AB Living  1       SAB IAB Ectopic Multiple Live Births          # Outcome Date GA Lbr Len/2nd Weight Sex Type Anes PTL Lv  1 Current            Past Surgical History:  Procedure Laterality Date   ADENOIDECTOMY     LAPAROSCOPIC GASTRIC SLEEVE RESECTION N/A 04/16/2020   Procedure: LAPAROSCOPIC GASTRIC SLEEVE RESECTION;  Surgeon: [name], MD;  Location: [facility];  Service: General;  Laterality: N/A;   TONSILLECTOMY     UPPER GI ENDOSCOPY N/A 04/16/2020   Procedure: UPPER GI ENDOSCOPY;  Surgeon: [name], MD;  Location: [facility];  Service: General;  Laterality: N/A;   Family History  Problem Relation Age of Onset   Stroke Mother    Hypertension Mother    Diabetes Mother    Hypertension Father    Diabetes Father    Colon cancer Father    Diabetes Sister    Hypertension Maternal Grandmother    Hypertension Paternal Grandmother    Diabetes Paternal Grandmother    Breast cancer Cousin    Social History   Tobacco Use   Smoking status: Former    Types: Cigarettes    Smokeless tobacco: Never  Vaping Use   Vaping status: Never Used  Substance Use Topics   Alcohol use: No   Drug use: Never   No Known Allergies No medications prior to admission.    I have reviewed patient's Past Medical Hx, Surgical Hx, Family Hx, Social Hx, medications and allergies.   ROS  Pertinent items noted in HPI and remainder of comprehensive ROS otherwise negative.   PHYSICAL EXAM  Patient Vitals for the past 24 hrs:  BP Temp Temp src Pulse Resp SpO2 Height Weight  04/27/23 0830 129/67 -- -- 69 18 99 % -- --  04/27/23 0815 131/70 -- -- 74 -- 99 % -- --  04/27/23 0800 133/69 -- -- 69 16 99 % -- --  04/27/23 0745 (!) 148/80 -- -- 77 18 -- -- --  04/27/23 0715 (!) 147/90 98.9 F (37.2 C) Oral 74 19 99 % -- --  04/27/23 0709 -- -- -- -- -- -- 5\' 6"  (1.676 m) (!) 181.8 kg    Constitutional: Well-developed, morbidly obese  female in no acute distress.  Cardiovascular: NM Rate/rhythm  Respiratory: normal effort, no problems with  respiration noted GI: Abd soft, NT ( Limited exam d/t technically difficult  exam secondary to patients increased body habitus) MS: Extremities nontender, no edema, normal ROM Neurologic: Alert and oriented x 4.  GU: no CVA tenderness Pelvic: deferred     Fetal Tracing: Doppler 146    Labs: Results for orders placed or performed during the hospital encounter of 04/27/23 (from the past 24 hours)  Urinalysis, Routine w reflex microscopic -Urine, Clean Catch     Status: None   Collection Time: 04/27/23  7:28 AM  Result Value Ref Range   Color, Urine YELLOW YELLOW   APPearance CLEAR CLEAR   Specific Gravity, Urine 1.014 1.005 - 1.030   pH 6.0 5.0 - 8.0   Glucose, UA NEGATIVE NEGATIVE mg/dL   Hgb urine dipstick NEGATIVE NEGATIVE   Bilirubin Urine NEGATIVE NEGATIVE   Ketones, ur NEGATIVE NEGATIVE mg/dL   Protein, ur NEGATIVE NEGATIVE mg/dL   Nitrite NEGATIVE NEGATIVE   Leukocytes,Ua NEGATIVE NEGATIVE      MDM & MAU COURSE   MDM:  Moderate   I have reviewed the patient chart and performed the physical exam . I have ordered & interpreted the lab results . A/P as described below.  Counseling and education provided and patient agreeable  with plan as described below. Verbalized understanding.    MAU Course: Orders Placed This Encounter  Procedures   Urinalysis, Routine w reflex microscopic -Urine, Clean Catch   Discharge patient Discharge disposition: 01-Home or Self Care; Discharge patient date: 04/27/2023     ASSESSMENT   1. Supervision of high risk pregnancy, antepartum   2. History of diabetes mellitus resolved following bariatric surgery   3. Morbid obesity (HCC)   4. Round ligament pain   5. [redacted] weeks gestation of pregnancy   6. Pre-existing essential hypertension during pregnancy in second trimester   7. BMI 60.0-69.9, adult [facility])     PLAN  Discharge home in stable condition with return precautions.  Follow up as scheduled Belly band recommended    Future Appointments  Date Time Provider Department Center  05/13/2023  9:45 AM [facility] HEALTH CLINICIAN [facility]  05/20/2023  1:55 PM Federico Flake, MD [facility]  05/21/2023 11:15 AM [facility]  05/25/2023 10:15 AM [facility] NURSE [facility]  05/25/2023 10:30 AM [facility] US3 [facility]  05/25/2023 11:00 AM [facility] PROVIDER 1 [facility]     Follow-up Information     Center for [employer] [facility] Follow up.   Specialty: Obstetrics and Gynecology Why: If symptoms worsen or fail to resolve, As scheduled for ongoing prenatal care Contact information: [address] [place] [address] [phone]                Marcell Barlow, MSN, [facility], Center for [employer]

## 2023-05-01 NOTE — BH Specialist Note (Unsigned)
 Integrated Behavioral Health via Telemedicine Visit  05/01/2023 Laura Benson [id number]  Number of Integrated Behavioral Health Clinician visits: No data recorded Session Start time: No data recorded  Session End time: No data recorded Total time in minutes: No data recorded  Referring Provider: *** Patient/Family location: Home*** [facility] Provider location: [facility]  All persons participating in visit: Patient agrees with treatment plan.  Types of Service: {CHL AMB TYPE OF SERVICE:[phone]}  I connected with Creasie Doctor and/or Ora Billing {family members:20773} via  Telephone or Video Enabled Telemedicine Application  (Video is Caregility application) and verified that I am speaking with the correct person using two identifiers. Discussed confidentiality: Yes   I discussed the limitations of telemedicine and the availability of in person appointments.  Discussed there is a possibility of technology failure and discussed alternative modes of communication if that failure occurs.  I discussed that engaging in this telemedicine visit, they consent to the provision of behavioral healthcare and the services will be billed under their insurance.  Patient and/or legal guardian expressed understanding and consented to Telemedicine visit: Yes   Presenting Concerns: Patient and/or family reports the following symptoms/concerns: *** Duration of problem: ***; Severity of problem: {Mild/Moderate/Severe:20260}  Patient and/or Family's Strengths/Protective Factors: {CHL AMB BH PROTECTIVE FACTORS:[phone]}  Goals Addressed: Patient will:  Reduce symptoms of: {IBH Symptoms:21014056}   Increase knowledge and/or ability of: {IBH Patient Tools:21014057}   Demonstrate ability to: {IBH Goals:21014053}  Progress towards Goals: {CHL AMB BH PROGRESS TOWARDS GOALS:[phone]}  Interventions: Interventions utilized:  {IBH  Interventions:21014054} Standardized Assessments completed: {IBH Screening Tools:21014051}  Patient and/or Family Response: Patient agrees with treatment plan.   Assessment: Patient currently experiencing ***.   Patient may benefit from psychoeducation and brief therapeutic interventions regarding coping with symptoms of *** .  Plan: Follow up with behavioral health clinician on : *** Behavioral recommendations:  -*** -*** Referral(s): {IBH Referrals:21014055}  I discussed the assessment and treatment plan with the patient and/or parent/guardian. They were provided an opportunity to ask questions and all were answered. They agreed with the plan and demonstrated an understanding of the instructions.   They were advised to call back or seek an in-person evaluation if the symptoms worsen or if the condition fails to improve as anticipated.  Georgia Kipper, LCSW     03/24/2023    4:41 PM 12/04/2021   11:23 AM 07/01/2021    1:06 PM 06/27/2020   10:27 AM 04/13/2019    8:23 AM  Depression screen PHQ 2/9  Decreased Interest 1 0 3 3 1   Down, Depressed, Hopeless 1 3 3 3 1   PHQ - 2 Score 2 3 6 6 2   Altered sleeping 1 3 3 3    Tired, decreased energy 3 2 2 3    Change in appetite 1 0 0 3   Feeling bad or failure about yourself  1 3 1 1    Trouble concentrating 0 3 2 3    Moving slowly or fidgety/restless 0 0 0 1   Suicidal thoughts 0  0 0   PHQ-9 Score 8 14 14 20    Difficult doing work/chores  Not difficult at all Somewhat difficult Very difficult       03/24/2023    4:42 PM 01/23/2016    3:25 PM  GAD 7 : Generalized Anxiety Score  Nervous, Anxious, on Edge 2 3  Control/stop worrying 1 3  Worry too much - different things 1 3  Trouble relaxing 1  3  Restless 0 3  Easily annoyed or irritable 1 3  Afraid - awful might happen 1 3  Total GAD 7 Score 7 21

## 2023-05-04 ENCOUNTER — Encounter: Payer: Self-pay | Admitting: Family Medicine

## 2023-05-12 ENCOUNTER — Encounter: Payer: Self-pay | Admitting: Obstetrics & Gynecology

## 2023-05-13 ENCOUNTER — Ambulatory Visit: Payer: Self-pay | Admitting: Clinical

## 2023-05-13 ENCOUNTER — Encounter: Payer: Self-pay | Admitting: *Deleted

## 2023-05-13 DIAGNOSIS — F411 Generalized anxiety disorder: Secondary | ICD-10-CM | POA: Diagnosis not present

## 2023-05-13 NOTE — Patient Instructions (Addendum)
 Center for [facility] at [facility] [address] [place] [address] [place], [place] [address] [phone] (main office) [phone] [facility] office)  [email or web address]  [place]  (Childcare options, Early childcare development, etc.) www.gu[place]children.org  [employer] Child Care Facility Search Engine  [email or web address].http://[email or web address]/     BRAINSTORMING  Develop a Plan Goals: Provide a way to start conversation about your new life with a baby Assist parents in recognizing and using resources within their reach Help pave the way before birth for an easier period of transition afterwards.  Make a list of the following information to keep in a central location: Full name of Mom and Partner: _____________________________________________ Baby's full name and Date of Birth: ___________________________________________ Home Address: ___________________________________________________________ ________________________________________________________________________ Home Phone: ____________________________________________________________ Parents' cell numbers: _____________________________________________________ ________________________________________________________________________ Name and contact info for OB: ______________________________________________ Name and contact info for Pediatrician:________________________________________ Contact info for Lactation Consultants: ________________________________________  REST and SLEEP *You each need at least 4-5 hours of uninterrupted sleep every day. Write specific names and contact information.* How are you going to rest in the postpartum period? While partner's home? When partner returns to work? When you both return to work? Where will your baby sleep? Who is available to help during the day? Evening? Night? Who could move in for a period to help support you? What are some  ideas to help you get enough sleep? __________________________________________________________________________________________________________________________________________________________________________________________________________________________________________ NUTRITIOUS FOOD AND DRINK *Plan for meals before your baby is born so you can have healthy food to eat during the immediate postpartum period.* Who will look after breakfast? Lunch? Dinner? List names and contact information. Brainstorm quick, healthy ideas for each meal. What can you do before baby is born to prepare meals for the postpartum period? How can others help you with meals? Which grocery stores provide online shopping and delivery? Which restaurants offer take-out or delivery options? ______________________________________________________________________________________________________________________________________________________________________________________________________________________________________________________________________________________________________________________________________________________________________________________________________  CARE FOR MOM *It's important that mom is cared for and pampered in the postpartum period. Remember, the most important ways new mothers need care are: sleep, nutrition, gentle exercise, and time off.* Who can come take care of mom during this period? Make a list of people with their contact information. List some activities that make you feel cared for, rested, and energized? Who can make sure you have opportunities to do these things? Does mom have a space of her very own within your home that's just for her? Make a "[facility]" where she can be comfortable, rest, and renew herself  daily. ______________________________________________________________________________________________________________________________________________________________________________________________________________________________________________________________________________________________________________________________________________________________________________________________________    CARE FOR AND FEEDING BABY *Knowledgeable and encouraging people will offer the best support with regard to feeding your baby.* Educate yourself and choose the best feeding option for your baby. Make a list of people who will guide, support, and be a resource for you as your care for and feed your baby. (Friends that have breastfed or are currently breastfeeding, lactation consultants, breastfeeding support groups, etc.) Consider a postpartum doula. (These websites can give you information: dona.org & https://shea.org/) Seek out local breastfeeding resources like the breastfeeding support group at [employer] or [employer]. ______________________________________________________________________________________________________________________________________________________________________________________________________________________________________________________________________________________________________________________________________________________________________________________________________  Judson Roch AND ERRANDS Who can help with a thorough cleaning before baby is born? Make a list of people who will help with housekeeping and chores, like laundry, light cleaning, dishes, bathrooms, etc. Who can run some errands for you? What can you do to make sure you are stocked with basic supplies before baby is born? Who is going to do the  shopping? ______________________________________________________________________________________________________________________________________________________________________________________________________________________________________________________________________________________________________________________________________________________________________________________________________     Family Adjustment *  Nurture yourselves.it helps parents be more loving and allows for better bonding with their child.* What sorts of things do you and partner enjoy doing together? Which activities help you to connect and strengthen your relationship? Make a list of those things. Make a list of people whom you trust to care for your baby so you can have some time together as a couple. What types of things help partner feel connected to Mom? Make a list. What needs will partner have in order to bond with baby? Other children? Who will care for them when you go into labor and while you are in the hospital? Think about what the needs of your older children might be. Who can help you meet those needs? In what ways are you helping them prepare for bringing baby home? List some specific strategies you have for family adjustment. _______________________________________________________________________________________________________________________________________________________________________________________________________________________________________________________________________________________________________________________________________________  SUPPORT *Someone who can empathize with experiences normalizes your problems and makes them more bearable.* Make a list of other friends, neighbors, and/or co-workers you know with infants (and small children, if applicable) with whom you can connect. Make a list of local or online support groups, mom groups, etc. in which you can be  involved. ______________________________________________________________________________________________________________________________________________________________________________________________________________________________________________________________________________________________________________________________________________________________________________________________________  Childcare Plans Investigate and plan for childcare if mom is returning to work. Talk about mom's concerns about her transition back to work. Talk about partner's concerns regarding this transition.  Mental Health *Your mental health is one of the highest priorities for a pregnant or postpartum mom.* 1 in 5 women experience anxiety and/or depression from the time of conception through the first year after birth. Postpartum Mood Disorders are the #1 complication of pregnancy and childbirth and the suffering experienced by these mothers is not necessary! These illnesses are temporary and respond well to treatment, which often includes self-care, social support, talk therapy, and medication when needed. Women experiencing anxiety and depression often say things like: "I'm supposed to be happy.why do I feel so sad?", "Why can't I snap out of it?", "I'm having thoughts that scare me." There is no need to be embarrassed if you are feeling these symptoms: Overwhelmed, anxious, angry, sad, guilty, irritable, hopeless, exhausted but can't sleep You are NOT alone. You are NOT to blame. With help, you WILL be well. Where can I find help? Medical professionals such as your OB, midwife, gynecologist, family practitioner, primary care provider, pediatrician, or mental health providers; [facility] support groups: Feelings After Birth, Breastfeeding Support Group, Baby and Me Group, and Fit 4 Two exercise classes. You have permission to ask for help. It will confirm your feelings, validate your experiences,  share/learn coping strategies, and gain support and encouragement as you heal. You are important! BRAINSTORM Make a list of local resources, including resources for mom and for partner. Identify support groups. Identify people to call late at night - include names and contact info. Talk with partner about perinatal mood and anxiety disorders. Talk with your OB, midwife, and doula about baby blues and about perinatal mood and anxiety disorders. Talk with your pediatrician about perinatal mood and anxiety disorders.   Support & Sanity Savers   What do you really need?  Basics In preparing for a new baby, many expectant parents spend hours shopping for baby clothes, decorating the nursery, and deciding which car seat to buy. Yet most don't think much about what the reality of parenting a newborn will be like, and what they need to make it through that. So, here is the advice of experienced parents.  We know you'll read this, and think "they're exaggerating, I don't really need that." Just trust [place] on these, OK? Plan for all of this, and if it turns out you don't need it, come back and teach [place] how you did it!  Must-Haves (Once baby's survival needs are met, make sure you attend to your own survival needs!) Sleep An average newborn sleeps 16-18 hours per day, over 6-7 sleep periods, rarely more than three hours at a time. It is normal and healthy for a newborn to wake throughout the night... but really hard on parents!! Naps. Prioritize sleep above any responsibilities like: cleaning house, visiting friends, running errands, etc.  Sleep whenever baby sleeps. If you can't nap, at least have restful times when baby eats. The more rest you get, the more patient you will be, the more emotionally stable, and better at solving problems.  Food You may not have realized it would be difficult to eat when you have a newborn. Yet, when we talk to countless new parents, they say things like "it may be 2:00 pm  when I realize I haven't had breakfast yet." Or "every time we sit down to dinner, baby needs to eat, and my food gets cold, so I don't bother to eat it." Finger food. Before your baby is born, stock up with one months' worth of food that: 1) you can eat with one hand while holding a baby, 2) doesn't need to be prepped, 3) is good hot or cold, 4) doesn't spoil when left out for a few hours, and 5) you like to eat. Think about: nuts, dried fruit, Clif bars, pretzels, jerky, gogurt, baby carrots, apples, bananas, crackers, cheez-n-crackers, string cheese, hot pockets or frozen burritos to microwave, garden burgers and breakfast pastries to put in the toaster, yogurt drinks, etc. Restaurant Menus. Make lists of your favorite restaurants & menu items. When family/friends want to help, you can give specific information without much thought. They can either bring you the food or send gift cards for just the right meals. Freezer Meals.  Take some time to make a few meals to put in the freezer ahead of time.  Easy to freeze meals can be anything such as soup, lasagna, chicken pie, or spaghetti sauce. Set up a Meal Schedule.  Ask friends and family to sign up to bring you meals during the first few weeks of being home. (It can be passed around at baby showers!) You have no idea how helpful this will be until you are in the throes of parenting.  Machine[employer].it is a great website to check out. Emotional Support Know who to call when you're stressed out. Parenting a newborn is very challenging work. There are times when it totally overwhelms your normal coping abilities. EVERY NEW PARENT NEEDS TO HAVE A PLAN FOR WHO TO CALL WHEN THEY JUST CAN'T COPE ANY MORE. (And it has to be someone other than the baby's other parent!) Before your baby is born, come up with at least one person you can call for support - write their phone number down and post it on the refrigerator. Anxiety & Sadness. Baby blues are normal after  pregnancy; however, there are more severe types of anxiety & sadness which can occur and should not be ignored.  They are always treatable, but you have to take the first step by reaching out for help. [facility] offers a "Mom Talk" group which meets every Tuesday from 10 am - 11 am.  This group  is for new moms who need support and connection after their babies are born.  Call [phone].  Really, Really Helpful (Plan for them! Make sure these happen often!!) Physical Support with Taking Care of Yourselves Asking friends and family. Before your baby is born, set up a schedule of people who can come and visit and help out (or ask a friend to schedule for you). Any time someone says "let me know what I can do to help," sign them up for a day. When they get there, their job is not to take care of the baby (that's your job and your joy). Their job is to take care of you!  Postpartum doulas. If you don't have anyone you can call on for support, look into postpartum doulas:  professionals at helping parents with caring for baby, caring for themselves, getting breastfeeding started, and helping with household tasks. www.padanc.org is a helpful website for learning about doulas in our area. Peer Support / Parent Groups Why: One of the greatest ideas for new parents is to be around other new parents. Parent groups give you a chance to share and listen to others who are going through the same season of life, get a sense of what is normal infant development by watching several babies learn and grow, share your stories of triumph and struggles with empathetic ears, and forgive your own mistakes when you realize all parents are learning by trial and error. Where to find: There are many places you can meet other new parents throughout our community.  [facility] offers the following classes for new moms and their little ones:  Baby and Me (Birth to Crawling) and Breastfeeding Support Group. Go to  www.[email or web address].com or call [phone] for more information. Time for your Relationship It's easy to get so caught up in meeting baby's immediate needs that it's hard to find time to connect with your partner, and meet the needs of your relationship. It's also easy to forget what "quality time with your partner" actually looks like. If you take your baby on a date, you'd be amazed how much of your couple time is spent feeding the baby, diapering the baby, admiring the baby, and talking about the baby. Dating: Try to take time for just the two of you. Babysitter tip: Sometimes when moms are breastfeeding a newborn, they find it hard to figure out how to schedule outings around baby's unpredictable feeding schedules. Have the babysitter come for a three hour period. When she comes over, if baby has just eaten, you can leave right away, and come back in two hours. If baby hasn't fed recently, you start the date at home. Once baby gets hungry and gets a good feeding in, you can head out for the rest of your date time. Date Nights at Home: If you can't get out, at least set aside one evening a week to prioritize your relationship: whenever baby dozes off or doesn't have any immediate needs, spend a little time focusing on each other. Potential conflicts: The main relationship conflicts that come up for new parents are: issues related to sexuality, financial stresses, a feeling of an unfair division of household tasks, and conflicts in parenting styles. The more you can work on these issues before baby arrives, the better!  Fun and Frills (Don't forget these. and don't feel guilty for indulging in them!) Everyone has something in life that is a fun little treat that they do just for themselves. It may be: reading the morning  paper, or going for a daily jog, or having coffee with a friend once a week, or going to a movie on Friday nights, or fine chocolates, or bubble baths, or curling up with a good  book. Unless you do fun things for yourself every now and then, it's hard to have the energy for fun with your baby. Whatever your "special" treats are, make sure you find a way to continue to indulge in them after your baby is born. These special moments can recharge you, and allow you to return to baby with a new joy   PERINATAL MOOD DISORDERS: MATERNAL MENTAL HEALTH FROM CONCEPTION THROUGH THE POSTPARTUM PERIOD   _________________________________________Emergency and Crisis Resources If you are an imminent risk to self or others, are experiencing intense personal distress, and/or have noticed significant changes in activities of daily living, call:  911 [facility]: [phone]  [address] [place], [place], [address] Mobile Crisis: [phone] National Suicide Hotline: 72 Or visit the following crisis centers: Local Emergency Departments [facility]:  [address], [place]  Mon-Friday 8am-3pm, [phone]                                                                                  ___________ Non-Crisis Resources To identify specific providers that are covered by your insurance, contact your insurance company or local agencies:   Postpartum Support International- Warm-line: (765)308-1255                                                      __Outpatient Therapy and Medication Management   Providers:  [facility]: [phone] Hours: 9AM-5PM  Insurance Accepted: Babetta Lesch, BCBS, Karsten Page, [place], Medicare  [employer] [facility] Care): [phone] Hours: 8AM-5:30PM  nsurance Accepted: All insurances EXCEPT AARP, [place], [place], and [employer] of the [place]: [phone] Hours: 8AM-8PM Insurance Accepted: Tiffany Foerster, Karsten Page, [place], Medicare, [phone] [facility]: [phone] Hours: 8:30AM-7PM Insurance Accepted: Tiffany Foerster, Medicaid, Medicare, Tricare, [employer] [facility]:  [phone]   Hours:9AM-5PM Insurance Accepted:  Raina Bunting, Mabeline Savant, [employer], [place], [employer] Care  [facility]: [phone] Hours: 9AM-5:30PM Insurance Accepted: Babetta Lesch, Jaylene Metz, and Medicaid, Medicare, [facility]:  [phone] Hours: 9am-5pm Insurance Accepted: BCBS; they do not accept Medicaid/Medicare [facility]: [phone] Hours: 9am-9pm Insurance Accepted: All major insurance including Medicaid and Medicare [facility]: [phone] Hours: 9AM- 5PM Insurance Accepted: All insurances EXCEPT Medicaid and Medicare. [facility]: [phone]   ____________                                                                     Parenting Support Groups  [facility] at [facility] :  [address] [place], [place], [address] [phone] [facility]:  [phone] Family Support Network: (support for children in the NICU and/or with special needs), [phone]     _____________                                                                                  Online Resources Postpartum Support International: SeekAlumni.co.za  800-944-4PPD Supporting Moms:  www.momssupportingmoms.net

## 2023-05-18 ENCOUNTER — Other Ambulatory Visit: Payer: Self-pay | Admitting: Obstetrics and Gynecology

## 2023-05-18 DIAGNOSIS — O10919 Unspecified pre-existing hypertension complicating pregnancy, unspecified trimester: Secondary | ICD-10-CM

## 2023-05-18 DIAGNOSIS — O0990 Supervision of high risk pregnancy, unspecified, unspecified trimester: Secondary | ICD-10-CM

## 2023-05-18 MED ORDER — AMLODIPINE BESYLATE 10 MG PO TABS: 10.0000 mg | ORAL_TABLET | Freq: Every day | ORAL | 3 refills | Status: DC

## 2023-05-18 NOTE — Progress Notes (Signed)
 Patient requesting amlodipine  refill.   Lacey Pian, MD Attending Obstetrician & Gynecologist, [facility] for [facility], [facility]

## 2023-05-20 ENCOUNTER — Ambulatory Visit: Payer: Managed Care, Other (non HMO) | Admitting: Family Medicine

## 2023-05-20 ENCOUNTER — Other Ambulatory Visit: Payer: Self-pay

## 2023-05-20 VITALS — BP 160/99 | HR 92 | Wt >= 6400 oz

## 2023-05-20 DIAGNOSIS — E1165 Type 2 diabetes mellitus with hyperglycemia: Secondary | ICD-10-CM

## 2023-05-20 DIAGNOSIS — O10912 Unspecified pre-existing hypertension complicating pregnancy, second trimester: Secondary | ICD-10-CM | POA: Diagnosis not present

## 2023-05-20 DIAGNOSIS — Z9884 Bariatric surgery status: Secondary | ICD-10-CM

## 2023-05-20 DIAGNOSIS — E119 Type 2 diabetes mellitus without complications: Secondary | ICD-10-CM | POA: Diagnosis not present

## 2023-05-20 DIAGNOSIS — Z3A19 19 weeks gestation of pregnancy: Secondary | ICD-10-CM

## 2023-05-20 DIAGNOSIS — F419 Anxiety disorder, unspecified: Secondary | ICD-10-CM

## 2023-05-20 DIAGNOSIS — O0992 Supervision of high risk pregnancy, unspecified, second trimester: Secondary | ICD-10-CM | POA: Diagnosis not present

## 2023-05-20 DIAGNOSIS — O10919 Unspecified pre-existing hypertension complicating pregnancy, unspecified trimester: Secondary | ICD-10-CM

## 2023-05-20 DIAGNOSIS — G4733 Obstructive sleep apnea (adult) (pediatric): Secondary | ICD-10-CM

## 2023-05-20 DIAGNOSIS — O0990 Supervision of high risk pregnancy, unspecified, unspecified trimester: Secondary | ICD-10-CM

## 2023-05-20 DIAGNOSIS — R768 Other specified abnormal immunological findings in serum: Secondary | ICD-10-CM

## 2023-05-20 MED ORDER — SERTRALINE HCL 25 MG PO TABS: 25.0000 mg | ORAL_TABLET | Freq: Every day | ORAL | 0 refills | Status: DC

## 2023-05-20 MED ORDER — DEXCOM G7 SENSOR MISC: 10 refills | Status: DC

## 2023-05-20 MED ORDER — DEXCOM G7 RECEIVER DEVI: 1.0000 [IU] | Freq: Every day | 1 refills | Status: DC

## 2023-05-20 NOTE — Patient Instructions (Signed)
 Contact natera at [phone] for genetic counseling.

## 2023-05-20 NOTE — Progress Notes (Signed)
 PRENATAL VISIT NOTE  Subjective:  Laura Benson is a 33 y.o. G1P0 at [redacted]w[redacted]d being seen today for ongoing prenatal care.  She is currently monitored for the following issues for this high-risk pregnancy and has Severe obesity (BMI >= 40) (HCC); Depression, recurrent (HCC); Diabetes mellitus without complication (HCC); Chronic hypertension in pregnancy; Obstructive sleep apnea; Umbilical hernia without obstruction and without gangrene; Asthma; Uncontrolled type 2 diabetes mellitus with hyperglycemia (HCC); S/P laparoscopic sleeve gastrectomy; Anxiety; Lumbar radiculopathy; Complication of anesthesia; GAD (generalized anxiety disorder); MDD (major depressive disorder), recurrent, severe, with psychosis (HCC); Supervision of high risk pregnancy, antepartum; Morbid obesity (HCC); Red blood cell antibody positive; Alpha thalassemia silent carrier; and Poor fetal growth affecting management of mother in second trimester on their problem list.  Patient reports no complaints.  Contractions: Not present.  .  Movement: Present. Denies leaking of fluid.   The following portions of the patient's history were reviewed and updated as appropriate: allergies, current medications, past family history, past medical history, past social history, past surgical history and problem list.   Objective:   Vitals:   05/20/23 1355  BP: (!) 160/99  Pulse: 92  Weight: (!) 405 lb (183.7 kg)    Fetal Status: Fetal Heart Rate (bpm): 150s   Movement: Present     General:  Alert, oriented and cooperative. Patient is in no acute distress.  Skin: Skin is warm and dry. No rash noted.   Cardiovascular: Normal heart rate noted  Respiratory: Normal respiratory effort, no problems with respiration noted  Abdomen: Soft, gravid, appropriate for gestational age.  Pain/Pressure: Present     Pelvic: Cervical exam deferred        Extremities: Normal range of motion.  Edema: None  Mental Status: Normal mood and affect. Normal  behavior. Normal judgment and thought content.   Assessment and Plan:  Pregnancy: G1P0 at [redacted]w[redacted]d 1. Type 2 diabetes mellitus with hyperglycemia, without long-term current use of insulin  (HCC) Was on metformin /glyburide  prior to gastric sleeve Fasting 90s 2hr PP Bfast (110s) Lunch (100s)-125/135(dinner)  - Continuous Glucose Receiver (DEXCOM G7 RECEIVER) DEVI; 1 Units by Does not apply route daily.  Dispense: 1 each; Refill: 1 - Continuous Glucose Sensor (DEXCOM G7 SENSOR) MISC; Apply one sensor every 10 days ICD:E11.65 (was on metformin  and glyburide  prior to gastric sleeve, poorly controlled currently).  Dispense: 3 each; Refill: 10  2. Diabetes mellitus without complication (HCC) - Continuous Glucose Receiver (DEXCOM G7 RECEIVER) DEVI; 1 Units by Does not apply route daily.  Dispense: 1 each; Refill: 1 - Continuous Glucose Sensor (DEXCOM G7 SENSOR) MISC; Apply one sensor every 10 days ICD:E11.65 (was on metformin  and glyburide  prior to gastric sleeve, poorly controlled currently).  Dispense: 3 each; Refill: 10  3. Chronic hypertension in pregnancy BPS at home 140/90s, elevated today but she feels this might be anxiety related - Protein / creatinine ratio, urine - Comprehensive metabolic panel with GFR - CBC  4. Supervision of high risk pregnancy, antepartum (Primary) Feeling flutters Unable to feel fundus Scheduled for anatomy scan  5. S/P laparoscopic sleeve gastrectomy  6. Red blood cell antibody positive - Antibody screen  7. Anxiety Patient brought up concern about anxiety Reviewed options for medication Desires to try zoloft  - sertraline  (ZOLOFT ) 25 MG tablet; Take 1 tablet (25 mg total) by mouth daily. In 1 week if no GI upset start 2 pills per day (50mg ), then in 1 week after that if no worsening sx increase to 4 pills per day (100mg )  Dispense: 90 tablet; Refill: 0  8. Obstructive sleep apnea - Ambulatory referral to Sleep Studies  Preterm labor symptoms and general  obstetric precautions including but not limited to vaginal bleeding, contractions, leaking of fluid and fetal movement were reviewed in detail with the patient. Please refer to After Visit Summary for other counseling recommendations.   Return in about 4 weeks (around 06/17/2023) for Routine prenatal care.  Future Appointments  Date Time Provider Department Center  05/28/2023 11:15 AM [facility]  06/04/2023 10:45 AM [facility]  06/08/2023  1:15 PM Abner Ables, MD [facility]  06/23/2023  8:00 AM [facility] PROVIDER 1 [facility]  06/23/2023  8:30 AM [facility] US6 [facility]  07/20/2023  1:00 PM [facility] PROVIDER 1 [facility]  07/20/2023  1:30 PM [facility] US4 [facility]  08/17/2023 10:00 AM [facility] PROVIDER 1 [facility]  08/17/2023 10:30 AM [facility] US2 [facility]    Abner Ables, MD

## 2023-05-20 NOTE — Progress Notes (Deleted)
 Patient was seen for Pre-existing Diabetes During Pregnancy on ***  Start time *** and End time ***   Estimated due date: 10/10/2023; ***w***d    Clinical: Medications: *** Medical History: *** Labs: A1c  Lab Results  Component Value Date   HGBA1C 6.2 (H) 03/24/2023    Dietary and Lifestyle History:    Hx: 3/35/25 Rice Chamorro    Patient lives with her father.  She is doing the shopping and cooking.  This is her first pregnancy. She works at [employer]. She reports increased stress due to sciatic pain and is waiting on disability.   She does receive WIC.  She forgot her blood glucose log this am. Fasting blood glucose 90 this am and usual Post meal 116, 119, 107 yesterday.  Occasional highs after cake and ice cream. Instructed patient to ask about changing some WIC dollars to foods that she should eat (instead of juice, would they cover nuts or greek  yogurt?) She states that she is taking her vitamins but needs to start Vitamin D . Protein intake and quality of meals has improved.  She is taking a prenatal vitamin daily and calcium.  Primary concerns today: Patient has questions about pregnancy nutrition and adequacy post bariatric sleeve - she asks if she can decrease her blood glucose testing due to A1C of 6.2% and mostly normal (per patient) glucose readings.  Advised her to continue testing as MD requests.  Numbers may likely increase with increasing insulin  resistance during pregnancy  Avoids added salt. Avoids frying Doesn't tolerate eggs Cooking her own meals now and avoiding eating out.    Estimated daily fluid intake: 150 oz - dislikes water  and uses sugar free flavor packets Supplements: prenatal vitamin - she was not taking any vitamins prior to the pregnancy other than iron during her period.  She does not have money for additional vitamins currently. Sleep: insomnia after waking in the middle of the night - 1-2 hour naps during the day as she is not sleeping  well at night Stress / self-care: high due to pain Current average weekly physical activity: increased amounts >10,000 steps at work   First Meal: 1 packet oatmeal (instant) with Lactaid milk Snack: none Second Meal: protein shake,  PB & J sandwich on [employer] Snack: cheese stick, beef stick, Third Meal: greens, mashed potatoes, steak burger with instant gravy Snack: none Beverages: water , flavored water   Physical Activity: *** Stress: *** Sleep: ***  24 hr Recall:  First Meal:  *** Snack:  *** Second meal:  *** Snack:  *** Third meal:  *** Snack:  *** Beverages:  ***  NUTRITION INTERVENTION  Nutrition education (E-1) on the following topics:   Initial Follow-up  []  []  Definition of Gestational Diabetes []  []  Why dietary management is important in controlling blood glucose []  []  Effects each nutrient has on blood glucose levels []  []  Simple carbohydrates vs complex carbohydrates []  []  Fluid intake []  []  Creating a balanced meal plan []  []  Carbohydrate counting  [x]  []  When to check blood glucose levels [x]  []  Proper blood glucose monitoring techniques []  []  Effect of stress and stress reduction techniques  []  []  Exercise effect on blood glucose levels, appropriate exercise during pregnancy [x]  []  Importance of limiting caffeine and abstaining from alcohol and smoking []  []  Medications used for blood sugar control during pregnancy []  []  Hypoglycemia and rule of 15 []  []  Postpartum self care  Blood glucose monitor given: *** Lot # *** Exp: *** CBG: *** mg/dL  ***  Patient has a meter prior to visit. Patient is *** testing pre breakfast and 2 hours after each meal. FBS: *** Postprandial: ***  Patient instructed to monitor glucose levels: FBS: 60 - <= 95 mg/dL; 2 hour: <= 161 mg/dL  Patient received handouts: Nutrition Diabetes and Pregnancy Carbohydrate Counting List Blood glucose log Snack ideas for diabetes during pregnancy  Patient will be seen for follow-up  as needed.

## 2023-05-21 ENCOUNTER — Other Ambulatory Visit

## 2023-05-21 DIAGNOSIS — E1165 Type 2 diabetes mellitus with hyperglycemia: Secondary | ICD-10-CM

## 2023-05-22 LAB — COMPREHENSIVE METABOLIC PANEL WITH GFR
ALT: 16 IU/L (ref 0–32)
AST: 15 IU/L (ref 0–40)
Albumin: 3.7 g/dL — ABNORMAL LOW (ref 3.9–4.9)
Alkaline Phosphatase: 56 IU/L (ref 44–121)
BUN/Creatinine Ratio: 10 (ref 9–23)
BUN: 7 mg/dL (ref 6–20)
Bilirubin Total: 0.4 mg/dL (ref 0.0–1.2)
CO2: 21 mmol/L (ref 20–29)
Calcium: 8.8 mg/dL (ref 8.7–10.2)
Chloride: 101 mmol/L (ref 96–106)
Creatinine, Ser: 0.69 mg/dL (ref 0.57–1.00)
Globulin, Total: 2.6 g/dL (ref 1.5–4.5)
Glucose: 96 mg/dL (ref 70–99)
Potassium: 4.3 mmol/L (ref 3.5–5.2)
Sodium: 135 mmol/L (ref 134–144)
Total Protein: 6.3 g/dL (ref 6.0–8.5)
eGFR: 117 mL/min/{1.73_m2} (ref >59)

## 2023-05-22 LAB — CBC
Hematocrit: 36.2 % (ref 34.0–46.6)
Hemoglobin: 11.1 g/dL (ref 11.1–15.9)
MCH: 23.6 pg — ABNORMAL LOW (ref 26.6–33.0)
MCHC: 30.7 g/dL — ABNORMAL LOW (ref 31.5–35.7)
MCV: 77 fL — ABNORMAL LOW (ref 79–97)
Platelets: 254 10*3/uL (ref 150–450)
RBC: 4.71 x10E6/uL (ref 3.77–5.28)
RDW: 15.9 % — ABNORMAL HIGH (ref 11.7–15.4)
WBC: 6.2 10*3/uL (ref 3.4–10.8)

## 2023-05-22 LAB — AB SCR+ANTIBODY ID: Antibody Screen: POSITIVE — AB

## 2023-05-22 LAB — PROTEIN / CREATININE RATIO, URINE
Creatinine, Urine: 44.4 mg/dL
Protein, Ur: 6.7 mg/dL
Protein/Creat Ratio: 151 mg/g{creat} (ref 0–200)

## 2023-05-22 LAB — ANTIBODY SCREEN

## 2023-05-25 ENCOUNTER — Ambulatory Visit: Payer: Managed Care, Other (non HMO) | Attending: Obstetrics and Gynecology

## 2023-05-25 ENCOUNTER — Ambulatory Visit (HOSPITAL_BASED_OUTPATIENT_CLINIC_OR_DEPARTMENT_OTHER): Payer: Managed Care, Other (non HMO) | Admitting: Maternal & Fetal Medicine

## 2023-05-25 ENCOUNTER — Ambulatory Visit: Payer: Managed Care, Other (non HMO) | Admitting: *Deleted

## 2023-05-25 ENCOUNTER — Other Ambulatory Visit: Payer: Self-pay | Admitting: *Deleted

## 2023-05-25 ENCOUNTER — Encounter: Payer: Self-pay | Admitting: *Deleted

## 2023-05-25 VITALS — BP 147/83 | HR 84

## 2023-05-25 DIAGNOSIS — O10919 Unspecified pre-existing hypertension complicating pregnancy, unspecified trimester: Secondary | ICD-10-CM | POA: Diagnosis present

## 2023-05-25 DIAGNOSIS — E119 Type 2 diabetes mellitus without complications: Secondary | ICD-10-CM | POA: Insufficient documentation

## 2023-05-25 DIAGNOSIS — O365920 Maternal care for other known or suspected poor fetal growth, second trimester, not applicable or unspecified: Secondary | ICD-10-CM | POA: Diagnosis present

## 2023-05-25 DIAGNOSIS — Z3A20 20 weeks gestation of pregnancy: Secondary | ICD-10-CM | POA: Diagnosis present

## 2023-05-25 DIAGNOSIS — O24119 Pre-existing diabetes mellitus, type 2, in pregnancy, unspecified trimester: Secondary | ICD-10-CM | POA: Diagnosis not present

## 2023-05-25 DIAGNOSIS — E1165 Type 2 diabetes mellitus with hyperglycemia: Secondary | ICD-10-CM

## 2023-05-25 DIAGNOSIS — O10012 Pre-existing essential hypertension complicating pregnancy, second trimester: Secondary | ICD-10-CM

## 2023-05-25 DIAGNOSIS — O0990 Supervision of high risk pregnancy, unspecified, unspecified trimester: Secondary | ICD-10-CM | POA: Diagnosis present

## 2023-05-25 DIAGNOSIS — O99212 Obesity complicating pregnancy, second trimester: Secondary | ICD-10-CM

## 2023-05-25 DIAGNOSIS — O24112 Pre-existing diabetes mellitus, type 2, in pregnancy, second trimester: Secondary | ICD-10-CM

## 2023-05-25 DIAGNOSIS — Z9884 Bariatric surgery status: Secondary | ICD-10-CM | POA: Diagnosis present

## 2023-05-25 DIAGNOSIS — Z8639 Personal history of other endocrine, nutritional and metabolic disease: Secondary | ICD-10-CM | POA: Insufficient documentation

## 2023-05-25 NOTE — Progress Notes (Signed)
 Patient information  Patient Name: Laura Benson  Patient MRN:   [id number]  Referring practice: MFM Referring Provider: [facility])  MFM CONSULT  Laura Benson is a 33 y.o. G1P0 at [redacted]w[redacted]d here for ultrasound and consultation. Patient Active Problem List   Diagnosis Date Noted   Alpha thalassemia silent carrier 04/08/2023   Red blood cell antibody positive 04/01/2023   Supervision of high risk pregnancy, antepartum 03/17/2023   Morbid obesity (HCC) 03/17/2023   MDD (major depressive disorder), recurrent, severe, with psychosis (HCC) 08/31/2021   GAD (generalized anxiety disorder) 10/23/2020   Complication of anesthesia 09/04/2020   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   Lumbar radiculopathy 08/17/2019   Uncontrolled type 2 diabetes mellitus with hyperglycemia (HCC) 01/18/2019   Anxiety 12/31/2018   Asthma 06/08/2018   Umbilical hernia without obstruction and without gangrene 02/25/2017   Obstructive sleep apnea 12/11/2016   Diabetes mellitus without complication (HCC) 01/23/2016   Chronic hypertension in pregnancy 01/23/2016   Depression, recurrent (HCC) 01/05/2009   Severe obesity (BMI >= 40) (HCC) 03/26/2006    Laura Benson has a pregnancy with the complications mentioned in the problem list. During today's visit we focused on the following concerns:   RE obesity w/ hx of bariatric surgery: The patient's BMI is 65.  She has lost nearly 80 pounds since having gastric sleeve surgery.  We discussed that the ultrasound is very limited due to her body habitus with poor acoustic windows.  I discussed the potential complications associated with obesity in pregnancy.  These complications include but are not limited to increased risk of excessive maternal weight gain, fetal growth abnormalities, fetal congenital disorders, inability to visualize fetal anatomic structures on ultrasound, gestational diabetes, hypertensive disorders of  pregnancy, operative birth including cesarean delivery or assisted vaginal delivery, delayed wound healing and many long-term health complications.  I discussed the need for continued growth ultrasounds and possibly antenatal testing depending upon how the pregnancy course progresses.  Maternal weight gain should be limited to 10 to 20 pounds during the pregnancy.  While normal weight loss may occur during the first and early second trimester, efforts to actively lose weight with the use of medication is not recommended during pregnancy.  A whole food diet and regular exercise of at least 15 to 30 minutes of moderately strenuous activity is recommended in the absence of any contraindications. Weight loss with the use of medications is not recommended during pregnancy.    RE hypertension:  Currently blood pressure is 121/84.  She is compliant with labetalol  200 mg daily as well as aspirin  for preeclampsia prophylaxis.   RE diabetes in pregnancy: She reports her fasting blood sugars are between 92 and 100.  Her 2-hour postprandial blood sugars are between 100 and 135.  She knows to continue to check blood sugars and report back to her physician.  We discussed the importance of proper glycemic control during the pregnancy currently under diabetes.  She is fully over 50% of the values of moderate Goldman the patient should be considered.  RE patient reports that she has a history of anemia: Due to the gastric sleeve may be difficult to achieve less than or ferritin levels prior to delivery with oral supplementation alone.  Consider IV iron infusion if her hemoglobin is below 11 and the ferritin is less than 30.   RE poor fetal growth: We discussed that the growth percentiles at the lower end of normal.  She will return in  3 to 4 weeks to assess fetal growth.  She has good dates based on known an exact last menstrual period that is similar to an early ultrasound.  Sonographic findings Single intrauterine  pregnancy at 20w 2d  Fetal cardiac activity:  Observed and appears normal. Presentation: Breech. The anatomic structures that were well seen appear normal without evidence of soft markers. Due to poor acoustic windows most of structures remain suboptimally visualized. Fetal biometry shows the estimated fetal weight at the 10 percentile.  Amniotic fluid: Within normal limits.  MVP: 6.36 cm. Placenta: Anterior. Adnexa: No abnormality visualized. Cervical length: 4.5 cm.  There are limitations of prenatal ultrasound such as the inability to detect certain abnormalities due to poor visualization. Various factors such as fetal position, gestational age and maternal body habitus may increase the difficulty in visualizing the fetal anatomy.   Recommendations -EDD is Estimated Date of Delivery: 10/10/23 based on LMP - known and exact. -Detailed ultrasound was done today without abnormalities but is significantly limited due to my general body habitus -Consider IV iron infusion if her hemoglobin is below 11 and the ferritin is less than 30.  -Baseline preeclampsia labs: CMP, CBC and urine protein/creatinine ratio if not previously completed.  -Continue glucose assessment.  If greater than 50% of blood sugars are not at goal with diet alone then consider adding insulin  or metformin . -Continue Labetalol  and Aspirin  81 mg for preeclampsia prophylaxis -Baseline EKG with maternal echo if there are any abnormalities -Follow-up anatomy and fetal growth in 4 to 6 weeks -Serial growth ultrasounds starting around 28 weeks to monitor for fetal growth restriction -Fetal echo  -if the anatomic structures are poorly visualized at her next ultrasound consider ordering a fetal echo -Antenatal testing to start around 32 weeks due to the increased risk of stillbirth and high risk pregnancy -Delivery timing pending clinical course but likely around 37-[redacted] weeks gestation -Continue routine prenatal care with referring OB  provider  Review of Systems: A review of systems was performed and was negative except per HPI   Vitals and Physical Exam    05/25/2023   11:13 AM 05/25/2023   10:28 AM 05/20/2023    1:55 PM  Vitals with BMI  Weight   405 lbs  BMI   65.4  Systolic 121 147 161  Diastolic 84 83 99  Pulse 82 84 92    Sitting comfortably on the sonogram table Nonlabored breathing Normal rate and rhythm Abdomen is nontender  Past pregnancies OB History  Gravida Para Term Preterm AB Living  1       SAB IAB Ectopic Multiple Live Births          # Outcome Date GA Lbr Len/2nd Weight Sex Type Anes PTL Lv  1 Current              I spent 60 minutes reviewing the patients chart, including labs and images as well as counseling the patient about her medical conditions. Greater than 50% of the time was spent in direct face-to-face patient counseling.  Penney Bowling, DO Maternal fetal medicine, [place]   05/25/2023  3:31 PM

## 2023-05-26 ENCOUNTER — Encounter: Payer: Self-pay | Admitting: Family Medicine

## 2023-05-26 DIAGNOSIS — E1165 Type 2 diabetes mellitus with hyperglycemia: Secondary | ICD-10-CM

## 2023-05-26 DIAGNOSIS — O24419 Gestational diabetes mellitus in pregnancy, unspecified control: Secondary | ICD-10-CM

## 2023-05-27 NOTE — Progress Notes (Signed)
 Patient was seen for Pre-existing Diabetes During Pregnancy on 05/28/2023   Start time 1115 and End time 1228   Estimated due date: 10/10/2023; [redacted]w[redacted]d   Clinical: Medications:  Current Outpatient Medications:    acetaminophen -caffeine (EXCEDRIN  TENSION HEADACHE) 500-65 MG TABS per tablet, Take 2 tablets by mouth every 8 (eight) hours as needed., Disp: 60 tablet, Rfl: 0   albuterol  (VENTOLIN  HFA) 108 (90 Base) MCG/ACT inhaler, Inhale 2 puffs into the lungs every 6 (six) hours as needed for wheezing or shortness of breath., Disp: 6.7 g, Rfl: 0   amLODipine  (NORVASC ) 10 MG tablet, Take 1 tablet (10 mg total) by mouth daily., Disp: 90 tablet, Rfl: 3   aspirin  EC 81 MG tablet, Take 81 mg by mouth 2 (two) times daily. Swallow whole., Disp: , Rfl:    Continuous Glucose Receiver (DEXCOM G7 RECEIVER) DEVI, 1 Units by Does not apply route daily., Disp: 1 each, Rfl: 1   Continuous Glucose Sensor (DEXCOM G7 SENSOR) MISC, Apply one sensor every 10 days ICD:E11.65 (was on metformin  and glyburide  prior to gastric sleeve, poorly controlled currently)., Disp: 3 each, Rfl: 10   fluticasone  (FLONASE ) 50 MCG/ACT nasal spray, Place 2 sprays into both nostrils daily., Disp: 9.9 mL, Rfl: 0   labetalol  (NORMODYNE ) 200 MG tablet, Take 1 tablet (200 mg total) by mouth 2 (two) times daily., Disp: 60 tablet, Rfl: 3   Prenatal Vit-Fe Fumarate-FA (PRENATAL VITAMINS PO), Take 1 tablet by mouth daily at 6 (six) AM., Disp: , Rfl:    promethazine  (PHENERGAN ) 25 MG tablet, Take 1 tablet (25 mg total) by mouth every 6 (six) hours as needed for nausea or vomiting., Disp: 30 tablet, Rfl: 0   sertraline  (ZOLOFT ) 25 MG tablet, Take 1 tablet (25 mg total) by mouth daily. In 1 week if no GI upset start 2 pills per day (50mg ), then in 1 week after that if no worsening sx increase to 4 pills per day (100mg ), Disp: 90 tablet, Rfl: 0   Medical History:  Past Medical History:  Diagnosis Date   Anxiety    Asthma    Complication of  anesthesia    Take for a while to wake up   Depression    Diabetes mellitus without complication (HCC)    Hypertension    Morbid obesity (HCC)    PCOS (polycystic ovarian syndrome)    Sleep apnea    CPAP   Tachycardia     Labs: Lab Results  Component Value Date   HGBA1C 6.2 (H) 03/24/2023   Dietary and Lifestyle History: Pt present today alone with dexcom G7 sensor. Pt was last seen by RD at our office on 04/21/2023. Pt reports post bariatric sleeve 2022. Pt reports she has not tested her blood sugar since 05/10/2023.  Pt states she was having to pay out of pocket for her strips and this was not financial feasible for her budget. Pt reports she is not working during this pregnancy. Pt reports difficulty counting carbohydrates. RD reviewed protein sources in addition. Pt reports sleeping consult for this month to address her poor sleep. Pt denies current CPAP use. All Pt's questions were answered during this encounter.    Physical Activity: walking 3 days weekly for 60 minutes Stress: 6 out of 10 /self care includes: talk to friends/family, music, meditate Sleep: "terrible" an average hours nightly 5    24 hr Recall:  First Meal:  ~ 9 am: 2 pancakes on a stick, water  with SF packets or bojangles, sweet tea  mixed with half unsweet tea (2.5 ppg 137 mg/dL) Snack:  peanut butter cracker or nutri grain bar Second meal:  skips 2-3 d/w or 2 slices whole wheat, heated [place], cheese, lettuce, tomato, onion, water  Snack:  peanut or cashews Third meal:  [place] sausage, eggs, 1-2 pancakes with regular syrup, water  Snack:  fruit cocktail  Beverages:  water , water  with SF packet, sweet teamixed with half unsweet tea  NUTRITION INTERVENTION  Nutrition education (E-1) on the following topics:   Initial Follow-up  []  []  Definition of Gestational Diabetes []  [x]  Why dietary management is important in controlling blood glucose []  [x]  Effects each nutrient has on blood glucose  levels []  [x]  Simple carbohydrates vs complex carbohydrates []  [x]  Fluid intake []  [x]  Creating a balanced meal plan []  [x]  Carbohydrate counting  []  [x]  When to check blood glucose levels []  []  Proper blood glucose monitoring techniques []  [x]  Effect of stress and stress reduction techniques  []  [x]  Exercise effect on blood glucose levels, appropriate exercise during pregnancy []  []  Importance of limiting caffeine and abstaining from alcohol and smoking []  [x]  Medications used for blood sugar control during pregnancy []  [x]  Hypoglycemia and rule of 15 []  []  Postpartum self care   Patient is instructed to test pre breakfast and 2 hours after each meal. Dexcom G7 placed in Pt left upper arm today. RD connected Pt's app to clinic in addition.  CBG: 137 mg/dL, reported as 2.5 hour post prandial    Patient instructed to monitor glucose levels: QID FBS: 60 - <= 95 mg/dL; 2 hour: <= 161 mg/dL  Patient received handouts: Pre existing diabetes in pregnancy Carbohydrate Counting List Blood glucose log Snack ideas for diabetes during pregnancy  Patient will be seen for follow-up 07/02/2023

## 2023-05-28 ENCOUNTER — Encounter: Attending: Obstetrics and Gynecology | Admitting: Dietician

## 2023-05-28 ENCOUNTER — Ambulatory Visit (INDEPENDENT_AMBULATORY_CARE_PROVIDER_SITE_OTHER): Admitting: Dietician

## 2023-05-28 ENCOUNTER — Other Ambulatory Visit: Payer: Self-pay

## 2023-05-28 DIAGNOSIS — Z713 Dietary counseling and surveillance: Secondary | ICD-10-CM | POA: Diagnosis present

## 2023-05-28 DIAGNOSIS — Z3A20 20 weeks gestation of pregnancy: Secondary | ICD-10-CM

## 2023-05-28 DIAGNOSIS — Z8639 Personal history of other endocrine, nutritional and metabolic disease: Secondary | ICD-10-CM | POA: Diagnosis not present

## 2023-05-28 DIAGNOSIS — O0990 Supervision of high risk pregnancy, unspecified, unspecified trimester: Secondary | ICD-10-CM | POA: Insufficient documentation

## 2023-05-28 DIAGNOSIS — Z9884 Bariatric surgery status: Secondary | ICD-10-CM | POA: Insufficient documentation

## 2023-05-28 DIAGNOSIS — E1165 Type 2 diabetes mellitus with hyperglycemia: Secondary | ICD-10-CM

## 2023-05-29 NOTE — BH Specialist Note (Signed)
 error

## 2023-06-04 ENCOUNTER — Ambulatory Visit: Admitting: Clinical

## 2023-06-08 ENCOUNTER — Other Ambulatory Visit: Payer: Self-pay

## 2023-06-08 ENCOUNTER — Ambulatory Visit: Admitting: Family Medicine

## 2023-06-08 ENCOUNTER — Other Ambulatory Visit: Payer: Self-pay | Admitting: Family Medicine

## 2023-06-08 VITALS — BP 164/93 | HR 83 | Wt >= 6400 oz

## 2023-06-08 DIAGNOSIS — E1165 Type 2 diabetes mellitus with hyperglycemia: Secondary | ICD-10-CM | POA: Diagnosis not present

## 2023-06-08 DIAGNOSIS — Z3A22 22 weeks gestation of pregnancy: Secondary | ICD-10-CM

## 2023-06-08 DIAGNOSIS — O99212 Obesity complicating pregnancy, second trimester: Secondary | ICD-10-CM | POA: Diagnosis not present

## 2023-06-08 DIAGNOSIS — O365920 Maternal care for other known or suspected poor fetal growth, second trimester, not applicable or unspecified: Secondary | ICD-10-CM

## 2023-06-08 DIAGNOSIS — O10919 Unspecified pre-existing hypertension complicating pregnancy, unspecified trimester: Secondary | ICD-10-CM

## 2023-06-08 DIAGNOSIS — E119 Type 2 diabetes mellitus without complications: Secondary | ICD-10-CM

## 2023-06-08 DIAGNOSIS — G4733 Obstructive sleep apnea (adult) (pediatric): Secondary | ICD-10-CM

## 2023-06-08 DIAGNOSIS — O99352 Diseases of the nervous system complicating pregnancy, second trimester: Secondary | ICD-10-CM

## 2023-06-08 DIAGNOSIS — O24419 Gestational diabetes mellitus in pregnancy, unspecified control: Secondary | ICD-10-CM | POA: Insufficient documentation

## 2023-06-08 DIAGNOSIS — F419 Anxiety disorder, unspecified: Secondary | ICD-10-CM

## 2023-06-08 DIAGNOSIS — Z9884 Bariatric surgery status: Secondary | ICD-10-CM

## 2023-06-08 DIAGNOSIS — O24112 Pre-existing diabetes mellitus, type 2, in pregnancy, second trimester: Secondary | ICD-10-CM | POA: Diagnosis not present

## 2023-06-08 DIAGNOSIS — O99842 Bariatric surgery status complicating pregnancy, second trimester: Secondary | ICD-10-CM

## 2023-06-08 DIAGNOSIS — O99342 Other mental disorders complicating pregnancy, second trimester: Secondary | ICD-10-CM

## 2023-06-08 DIAGNOSIS — O0990 Supervision of high risk pregnancy, unspecified, unspecified trimester: Secondary | ICD-10-CM

## 2023-06-08 DIAGNOSIS — O24414 Gestational diabetes mellitus in pregnancy, insulin controlled: Secondary | ICD-10-CM | POA: Insufficient documentation

## 2023-06-08 MED ORDER — INSULIN GLARGINE 100 UNIT/ML SOLOSTAR PEN: 10.0000 [IU] | PEN_INJECTOR | Freq: Every day | SUBCUTANEOUS | 3 refills | Status: DC

## 2023-06-08 MED ORDER — DEXCOM G7 SENSOR MISC: 10 refills | Status: DC

## 2023-06-08 MED ORDER — INSULIN ASPART PENFILL 100 UNIT/ML ~~LOC~~ SOCT: 5.0000 [IU] | Freq: Three times a day (TID) | SUBCUTANEOUS | 3 refills | Status: DC | PRN

## 2023-06-08 MED ORDER — BD PEN NEEDLE NANO 2ND GEN 32G X 4 MM MISC: 1.0000 [IU] | Freq: Every day | 1 refills | Status: DC

## 2023-06-08 NOTE — Progress Notes (Signed)
 PRENATAL VISIT NOTE  Subjective:  Laura Benson is a 33 y.o. G1P0 at [redacted]w[redacted]d being seen today for ongoing prenatal care.  She is currently monitored for the following issues for this high-risk pregnancy and has Severe obesity (BMI >= 40) (HCC); Depression, recurrent (HCC); Chronic hypertension in pregnancy; Obstructive sleep apnea; Umbilical hernia without obstruction and without gangrene; Asthma; Uncontrolled type 2 diabetes mellitus with hyperglycemia (HCC); S/P laparoscopic sleeve gastrectomy; Anxiety; Lumbar radiculopathy; Complication of anesthesia; GAD (generalized anxiety disorder); MDD (major depressive disorder), recurrent, severe, with psychosis (HCC); Supervision of high risk pregnancy, antepartum; Morbid obesity (HCC); Anti M Antibody positive; Alpha thalassemia silent carrier; Poor fetal growth affecting management of mother in second trimester; and Gestational diabetes mellitus (GDM) affecting pregnancy, antepartum on their problem list.  Patient reports pain in bilateral arms from dexcom, noted to have bruises.    Also dx with FGR at MFM US - Est. FW: 293 gm 0 lb 10 oz 10 %   Contractions: Not present. Vag. Bleeding: None.  Movement: Present. Denies leaking of fluid.   The following portions of the patient's history were reviewed and updated as appropriate: allergies, current medications, past family history, past medical history, past social history, past surgical history and problem list.   Objective:   Vitals:   06/08/23 1326 06/08/23 1335  BP: (!) 170/99 (!) 164/93  Pulse: 84 83  Weight: (!) 408 lb 12.8 oz (185.4 kg)      Fetal Status: Fetal Heart Rate (bpm): 145 (used US  and was diffcult due to pannus)   Movement: Present      General: Alert, oriented and cooperative. Patient is in no acute distress.  Skin: Skin is warm and dry. No rash noted.   Cardiovascular: Normal heart rate noted  Respiratory: Normal respiratory effort, no problems with respiration noted   Abdomen: Soft, gravid, appropriate for gestational age.  Pain/Pressure: Absent     Pelvic: Cervical exam deferred        Extremities: Normal range of motion.  Edema: None  Mental Status: Normal mood and affect. Normal behavior. Normal judgment and thought content.   Assessment and Plan:  Pregnancy: G1P0 at [redacted]w[redacted]d 1. Chronic hypertension in pregnancy (Primary) Poorly controlled today Did not take her medications this morning because she is so busy Baseline labs were obtained on 4/23 No HA, SOB, blurry vision Patient will take med at home and plans to take her BP 99min-1 hr after and send via mychart Referred to [facility]   2. Diabetes mellitus without complication (HCC) Using dexcom x2 weeks Average 7d - 131 Having rxn to the dexcom-- this appears to be a bruise on the upper arm, most likely related to skin and pressure on arm. No itching. Recommend placing on her abdomen. There is more than sufficient adipose and her reaction on arms appears like a pressure bruise.  - Follow up with DM education - Discussed with patient starting insulin  vs more diet control. Desires to start insulin . Has nicely timed visit with DM education to review log.  - Lantus 10 u at bedtime - Aspart 5 u with meals  3. Severe obesity (BMI >= 40) (HCC) TWG=34 lb 12.8 oz (15.8 kg) which is above goal  4. Supervision of high risk pregnancy, antepartum Up to date FH unable to be palpated FHR difficult to ascultate, US  used and visualized movement Did not like her experience with MFM-- she felt they dismissed her due to size.   5. Anxiety Reports sx are improved since starting  zoloft  Reports more depression recently due to weight gain On zoloft  25, recommending increasing to 50mg  daily  6. Obstructive sleep apnea Has appt with [facility] for sleep apnea  7. FGR -Discussed with patient - Reviewed growth US  plan  8. H/o gastric sleeve - ANemia B profile today  Preterm labor symptoms and  general obstetric precautions including but not limited to vaginal bleeding, contractions, leaking of fluid and fetal movement were reviewed in detail with the patient. Please refer to After Visit Summary for other counseling recommendations.   Return in about 4 weeks (around 07/06/2023) for Mom+Baby Combined Care.  Future Appointments  Date Time Provider Department Center  06/10/2023  1:45 PM Debbra Fairy, MD [facility] None  06/23/2023  8:00 AM [facility] PROVIDER 1 [facility]  06/23/2023  8:30 AM [facility] US6 [facility]  07/02/2023 11:15 AM [facility]-EDUCATION [facility]  07/02/2023  2:15 PM [facility] CLINICIAN [facility]  07/06/2023  1:35 PM Abner Ables, MD [facility]  07/20/2023  1:00 PM [facility] PROVIDER 1 [facility]  07/20/2023  1:30 PM [facility] US4 [facility]  08/17/2023 10:00 AM [facility] PROVIDER 1 [facility]  08/17/2023 10:30 AM [facility] US2 [facility]    Abner Ables, MD

## 2023-06-09 ENCOUNTER — Ambulatory Visit: Payer: Self-pay | Admitting: Family Medicine

## 2023-06-09 DIAGNOSIS — Z0289 Encounter for other administrative examinations: Secondary | ICD-10-CM

## 2023-06-09 LAB — ANEMIA PROFILE B
Basophils Absolute: 0 10*3/uL (ref 0.0–0.2)
Basos: 0 %
EOS (ABSOLUTE): 0.1 10*3/uL (ref 0.0–0.4)
Eos: 1 %
Ferritin: 20 ng/mL (ref 15–150)
Folate: >20 ng/mL (ref >3.0)
Hematocrit: 38.8 % (ref 34.0–46.6)
Hemoglobin: 11.8 g/dL (ref 11.1–15.9)
Immature Grans (Abs): 0 10*3/uL (ref 0.0–0.1)
Immature Granulocytes: 0 %
Iron Saturation: 17 % (ref 15–55)
Iron: 63 ug/dL (ref 27–159)
Lymphocytes Absolute: 1.6 10*3/uL (ref 0.7–3.1)
Lymphs: 24 %
MCH: 23.7 pg — ABNORMAL LOW (ref 26.6–33.0)
MCHC: 30.4 g/dL — ABNORMAL LOW (ref 31.5–35.7)
MCV: 78 fL — ABNORMAL LOW (ref 79–97)
Monocytes Absolute: 0.8 10*3/uL (ref 0.1–0.9)
Monocytes: 11 %
Neutrophils Absolute: 4.2 10*3/uL (ref 1.4–7.0)
Neutrophils: 64 %
Platelets: 249 10*3/uL (ref 150–450)
RBC: 4.98 x10E6/uL (ref 3.77–5.28)
RDW: 15.3 % (ref 11.7–15.4)
Retic Ct Pct: 1.5 % (ref 0.6–2.6)
Total Iron Binding Capacity: 377 ug/dL (ref 250–450)
UIBC: 314 ug/dL (ref 131–425)
Vitamin B-12: 301 pg/mL (ref 232–1245)
WBC: 6.7 10*3/uL (ref 3.4–10.8)

## 2023-06-10 ENCOUNTER — Encounter: Payer: Self-pay | Admitting: Neurology

## 2023-06-10 ENCOUNTER — Institutional Professional Consult (permissible substitution): Admitting: Neurology

## 2023-06-10 NOTE — Telephone Encounter (Signed)
 Patient states that the insulin  is not covered by insurance but the needles are.

## 2023-06-16 ENCOUNTER — Other Ambulatory Visit: Payer: Self-pay | Admitting: Lactation Services

## 2023-06-16 ENCOUNTER — Telehealth: Payer: Self-pay | Admitting: Lactation Services

## 2023-06-16 ENCOUNTER — Other Ambulatory Visit: Payer: Self-pay | Admitting: Family Medicine

## 2023-06-16 DIAGNOSIS — E1165 Type 2 diabetes mellitus with hyperglycemia: Secondary | ICD-10-CM

## 2023-06-16 MED ORDER — OMNIPOD 5 DEXG7G6 INTRO GEN 5 KIT: 1.0000 | PACK | 0 refills | Status: DC

## 2023-06-16 MED ORDER — INSULIN ASPART 100 UNIT/ML IJ SOLN: INTRAMUSCULAR | 12 refills | Status: DC

## 2023-06-16 MED ORDER — OMNIPOD 5 DEXG7G6 PODS GEN 5 MISC: 1.0000 | 8 refills | Status: DC

## 2023-06-16 NOTE — Telephone Encounter (Signed)
 Adjusting insulin  orders so she can receive an insulin  pump.  Current regimen:  Glargine 10u at bedtime Aspart 5u TID with meals  For pump regimen will be: Aspart insulin  Basal rate: 0.5 u/hr Bolus: 5u TID with meals  Genevia Kern please submit PA with above information.

## 2023-06-16 NOTE — Telephone Encounter (Signed)
 Creasie Doctor (KeySherrlyn Dolores) Need Help? Call us  at [phone] Outcome Additional Information Required Drug is covered by current benefit plan. No further PA activity needed Drug Omnipod 5 DexG7G6 Pods Gen 5 ePA cloud logo Form [employer] Electronic PA Form [phone] NCPDP)  PA for Omnipod approved. Patient will need to meet with Diabetes Education for starting Omnipod. Will message Rice Chamorro and Greta for follow up.   Will message patient via Mychart also.

## 2023-06-17 ENCOUNTER — Telehealth: Payer: Self-pay | Admitting: Lactation Services

## 2023-06-17 NOTE — Telephone Encounter (Signed)
 Called Pharmacy to discuss insulin  and what insulin  is approved by patients insurance as patient reports Novolog  is not covered. Per Pharmacy, they are not sure which one is covered.

## 2023-06-19 ENCOUNTER — Telehealth: Payer: Self-pay | Admitting: Dietician

## 2023-06-19 MED ORDER — INSULIN LISPRO 100 UNIT/ML IJ SOLN: INTRAMUSCULAR | 11 refills | Status: DC

## 2023-06-19 NOTE — Telephone Encounter (Signed)
 Called patient on 06/19/2023  to discuss instructions prior to Omnipod 5 training appointment   Patient is not taking long acting insulin  as this is not covered by her insurance.  She also was not given an insulin  pen for meals (just a vial for the pump).  Her Dexcom sensor expired this am and she was unable to refill this until 5/28.  She will need a working sensor for the pump training.  Patient must bring a vial of rapid acting (Novolog , Humalog) insulin  to appointment. Checked with patient if refill of vial of rapid acting (Novolog , Humalog) insulin   is necessary.  3.  Informed patient and/or patient guardian that patient and/or patient guardian will obtain the Omnipod 5 Intro Kit from the pharmacy (it will be a large box). Advised the patient and/or patient guardian to bring the entire box to the appointment.  The kit will contain  1 personal diabetes manager (PDM) device 2 boxes (each box = 5 pods) of Omnipod 5 pods - The name of your continuous glucose monitor that you are using with the pump must be on the box. pod pals (stickers that go over pod to assist with adhesion) PDM charger  User manual   Informed patient that a cell phone that is compatible with the Dexcom G7 app MUST be used to be able to use the Omnipod 5 in auto mode.   Patient MUST make accounts in advance on the following websites. Patient MUST have user names and passwords available at [facility] 5 appointment. [email or web address]  Appointment for pump training scheduled for June 4 at 9:15. Will contact MD to sign orders. Will send patient a My Chart message with the above information.  Cydne Doyne, RD, LDN, CDCES, DipACLM

## 2023-06-23 ENCOUNTER — Ambulatory Visit: Attending: Maternal & Fetal Medicine | Admitting: Obstetrics

## 2023-06-23 ENCOUNTER — Other Ambulatory Visit: Payer: Self-pay | Admitting: *Deleted

## 2023-06-23 ENCOUNTER — Ambulatory Visit

## 2023-06-23 VITALS — BP 144/77 | HR 69

## 2023-06-23 DIAGNOSIS — O10912 Unspecified pre-existing hypertension complicating pregnancy, second trimester: Secondary | ICD-10-CM

## 2023-06-23 DIAGNOSIS — Z794 Long term (current) use of insulin: Secondary | ICD-10-CM | POA: Insufficient documentation

## 2023-06-23 DIAGNOSIS — O365920 Maternal care for other known or suspected poor fetal growth, second trimester, not applicable or unspecified: Secondary | ICD-10-CM

## 2023-06-23 DIAGNOSIS — E1165 Type 2 diabetes mellitus with hyperglycemia: Secondary | ICD-10-CM | POA: Insufficient documentation

## 2023-06-23 DIAGNOSIS — O321XX0 Maternal care for breech presentation, not applicable or unspecified: Secondary | ICD-10-CM | POA: Insufficient documentation

## 2023-06-23 DIAGNOSIS — O99212 Obesity complicating pregnancy, second trimester: Secondary | ICD-10-CM

## 2023-06-23 DIAGNOSIS — O99342 Other mental disorders complicating pregnancy, second trimester: Secondary | ICD-10-CM

## 2023-06-23 DIAGNOSIS — O0990 Supervision of high risk pregnancy, unspecified, unspecified trimester: Secondary | ICD-10-CM

## 2023-06-23 DIAGNOSIS — O10012 Pre-existing essential hypertension complicating pregnancy, second trimester: Secondary | ICD-10-CM

## 2023-06-23 DIAGNOSIS — O24112 Pre-existing diabetes mellitus, type 2, in pregnancy, second trimester: Secondary | ICD-10-CM

## 2023-06-23 DIAGNOSIS — Z3A24 24 weeks gestation of pregnancy: Secondary | ICD-10-CM

## 2023-06-23 DIAGNOSIS — Z79899 Other long term (current) drug therapy: Secondary | ICD-10-CM | POA: Insufficient documentation

## 2023-06-23 DIAGNOSIS — O99842 Bariatric surgery status complicating pregnancy, second trimester: Secondary | ICD-10-CM

## 2023-06-23 DIAGNOSIS — O10919 Unspecified pre-existing hypertension complicating pregnancy, unspecified trimester: Secondary | ICD-10-CM

## 2023-06-23 DIAGNOSIS — O24319 Unspecified pre-existing diabetes mellitus in pregnancy, unspecified trimester: Secondary | ICD-10-CM

## 2023-06-23 DIAGNOSIS — F419 Anxiety disorder, unspecified: Secondary | ICD-10-CM

## 2023-06-23 DIAGNOSIS — O24419 Gestational diabetes mellitus in pregnancy, unspecified control: Secondary | ICD-10-CM

## 2023-06-23 NOTE — Progress Notes (Signed)
 MFM Consult Note  Laura Benson is currently at 24 weeks and 3 days.  She has been followed due to maternal obesity with a BMI of 62.4, pregestational diabetes treated with insulin , chronic hypertension treated with amlodipine  and labetalol , and history of gastric bypass surgery.    Her fasting fingerstick values have ranged between 85 to the low 100s and her 2-hour postprandial fingerstick values have ranged in the low 100s to the high 160s range.  The patient reports that she will be getting an insulin  pump and Dexcom glucose monitor soon.  She has had 2 [employer]or[employer] cell free DNA tests that could not provide a risk assessment due to insufficient fetal DNA.  A small for gestational age fetus measuring at the 10th percentile was noted on her last exam.    She denies any problems since her last exam.  On today's exam, the overall EFW of 1 pound 5 ounces measures at the 10th percentile for her gestational age.  There is normal amniotic fluid noted.  Doppler studies of the umbilical arteries performed today shows normal forward flow.  There were no signs of absent or reversed end-diastolic flow noted today.  The views of the fetal anatomy remain limited today due to extreme maternal body habitus.  The patient understands that we will continue to assess the views of the fetal anatomy during her future ultrasound exams.  However, if we are unable to clear all of the views of the fetal anatomy during her prenatal ultrasound exams, her baby will have to be examined after birth to determine if any abnormalities are present.    Due to pregestational diabetes, she was referred to [facility] pediatric cardiology for a fetal echocardiogram.  The patient was advised that hopefully once she obtains her Dexcom glucose monitor which can then be linked to her insulin  pump, she will achieve better glycemic control.  For now, she should continue the daily insulin  injections as prescribed.  The increased risk of IUGR,  superimposed preeclampsia, and and indicated preterm delivery due to her underlying medical conditions was discussed.    We will continue to follow her with growth ultrasounds throughout her pregnancy.    We will start weekly fetal testing at 32 weeks.    The goal for her delivery would be at around 37 weeks.    A follow-up exam was scheduled in 3 weeks.    The patient stated that all of her questions were answered today.  A total of 30 minutes was spent counseling and coordinating the care for this patient.  Greater than 50% of the time was spent in direct face-to-face contact.

## 2023-06-24 NOTE — Progress Notes (Deleted)
 Patient was seen for Pre-existing Diabetes During Pregnancy on 05/28/2023   Start time 1115 and End time 1228   Estimated due date: 10/10/2023; [redacted]w[redacted]d   Clinical: Medications:  Current Outpatient Medications:    acetaminophen -caffeine (EXCEDRIN  TENSION HEADACHE) 500-65 MG TABS per tablet, Take 2 tablets by mouth every 8 (eight) hours as needed. (Patient not taking: Reported on 06/23/2023), Disp: 60 tablet, Rfl: 0   albuterol  (VENTOLIN  HFA) 108 (90 Base) MCG/ACT inhaler, Inhale 2 puffs into the lungs every 6 (six) hours as needed for wheezing or shortness of breath., Disp: 6.7 g, Rfl: 0   amLODipine  (NORVASC ) 10 MG tablet, Take 1 tablet (10 mg total) by mouth daily., Disp: 90 tablet, Rfl: 3   aspirin  EC 81 MG tablet, Take 81 mg by mouth 2 (two) times daily. Swallow whole., Disp: , Rfl:    Continuous Glucose Receiver (DEXCOM G7 RECEIVER) DEVI, 1 Units by Does not apply route daily. (Patient not taking: Reported on 06/23/2023), Disp: 1 each, Rfl: 1   Continuous Glucose Sensor (DEXCOM G7 SENSOR) MISC, Apply one sensor every 10 days ICD:E11.65 (was on metformin  and glyburide  prior to gastric sleeve, poorly controlled currently). (Patient not taking: Reported on 06/23/2023), Disp: 3 each, Rfl: 10   fluticasone  (FLONASE ) 50 MCG/ACT nasal spray, Place 2 sprays into both nostrils daily. (Patient not taking: Reported on 06/23/2023), Disp: 9.9 mL, Rfl: 0   Insulin  Aspart PenFill 100 UNIT/ML SOCT, Inject 5 Units into the skin 3 (three) times daily with meals as needed. (Patient not taking: Reported on 06/23/2023), Disp: 15 mL, Rfl: 3   Insulin  Disposable Pump (OMNIPOD 5 DEXG7G6 INTRO GEN 5) KIT, 1 Device by Does not apply route every 3 (three) days. (Patient not taking: Reported on 06/23/2023), Disp: 1 kit, Rfl: 0   Insulin  Disposable Pump (OMNIPOD 5 DEXG7G6 PODS GEN 5) MISC, 1 Device by Does not apply route every 3 (three) days. (Patient not taking: Reported on 06/23/2023), Disp: 3 each, Rfl: 8   insulin  glargine  (LANTUS ) 100 UNIT/ML Solostar Pen, Inject 10 Units into the skin at bedtime. (Patient not taking: Reported on 06/23/2023), Disp: 15 mL, Rfl: 3   insulin  lispro (HUMALOG ) 100 UNIT/ML injection, For use with insulin  pump. Basal rate: 0.5 u/hr. Bolus: 5 units TID with meals. (Patient not taking: Reported on 06/23/2023), Disp: 10 mL, Rfl: 11   Insulin  Pen Needle (BD PEN NEEDLE NANO 2ND GEN) 32G X 4 MM MISC, 1 Units by Does not apply route daily. (Patient not taking: Reported on 06/23/2023), Disp: 100 each, Rfl: 1   labetalol  (NORMODYNE ) 200 MG tablet, Take 1 tablet (200 mg total) by mouth 2 (two) times daily., Disp: 60 tablet, Rfl: 3   Prenatal Vit-Fe Fumarate-FA (PRENATAL VITAMINS PO), Take 1 tablet by mouth daily at 6 (six) AM., Disp: , Rfl:    promethazine  (PHENERGAN ) 25 MG tablet, Take 1 tablet (25 mg total) by mouth every 6 (six) hours as needed for nausea or vomiting., Disp: 30 tablet, Rfl: 0   sertraline  (ZOLOFT ) 25 MG tablet, Take 1 tablet (25 mg total) by mouth daily. In 1 week if no GI upset start 2 pills per day (50mg ), then in 1 week after that if no worsening sx increase to 4 pills per day (100mg ), Disp: 90 tablet, Rfl: 0   Medical History:  Past Medical History:  Diagnosis Date   Anxiety    Asthma    Complication of anesthesia    Take for a while to wake up   Depression  Diabetes mellitus without complication (HCC)    Hypertension    Morbid obesity (HCC)    PCOS (polycystic ovarian syndrome)    Sleep apnea    CPAP   Tachycardia     Labs: Lab Results  Component Value Date   HGBA1C 6.2 (H) 03/24/2023   Dietary and Lifestyle History: Pt present today alone with dexcom G7 sensor. Pt was last seen by RD at our office on 04/21/2023. Pt reports post bariatric sleeve 2022. Pt reports she has not tested her blood sugar since 05/10/2023.  Pt states she was having to pay out of pocket for her strips and this was not financial feasible for her budget. Pt reports she is not working during  this pregnancy. Pt reports difficulty counting carbohydrates. RD reviewed protein sources in addition. Pt reports sleeping consult for this month to address her poor sleep. Pt denies current CPAP use. All Pt's questions were answered during this encounter.    Physical Activity: walking 3 days weekly for 60 minutes Stress: 6 out of 10 /self care includes: talk to friends/family, music, meditate Sleep: terrible an average hours nightly 5    24 hr Recall:  First Meal:  ~ 9 am: 2 pancakes on a stick, water  with SF packets or bojangles, sweet tea mixed with half unsweet tea (2.5 ppg 137 mg/dL) Snack:  peanut butter cracker or nutri grain bar Second meal:  skips 2-3 d/w or 2 slices whole wheat, heated [place], cheese, lettuce, tomato, onion, water  Snack:  peanut or cashews Third meal:  [place] sausage, eggs, 1-2 pancakes with regular syrup, water  Snack:  fruit cocktail  Beverages:  water , water  with SF packet, sweet teamixed with half unsweet tea  NUTRITION INTERVENTION  Nutrition education (E-1) on the following topics:   Initial Follow-up  []  []  Definition of Gestational Diabetes []  [x]  Why dietary management is important in controlling blood glucose []  [x]  Effects each nutrient has on blood glucose levels []  [x]  Simple carbohydrates vs complex carbohydrates []  [x]  Fluid intake []  [x]  Creating a balanced meal plan []  [x]  Carbohydrate counting  []  [x]  When to check blood glucose levels []  []  Proper blood glucose monitoring techniques []  [x]  Effect of stress and stress reduction techniques  []  [x]  Exercise effect on blood glucose levels, appropriate exercise during pregnancy []  []  Importance of limiting caffeine and abstaining from alcohol and smoking []  [x]  Medications used for blood sugar control during pregnancy []  [x]  Hypoglycemia and rule of 15 []  []  Postpartum self care   Patient is instructed to test pre breakfast and 2 hours after each meal. Dexcom G7 placed in Pt left upper  arm today. RD connected Pt's app to clinic in addition.  CBG: 137 mg/dL, reported as 2.5 hour post prandial    Patient instructed to monitor glucose levels: QID FBS: 60 - <= 95 mg/dL; 2 hour: <= 879 mg/dL  Patient received handouts: Pre existing diabetes in pregnancy Carbohydrate Counting List Blood glucose log Snack ideas for diabetes during pregnancy  Patient will be seen for follow-up 07/02/2023

## 2023-06-30 ENCOUNTER — Other Ambulatory Visit: Payer: Self-pay

## 2023-06-30 DIAGNOSIS — Z8639 Personal history of other endocrine, nutritional and metabolic disease: Secondary | ICD-10-CM

## 2023-07-01 ENCOUNTER — Encounter: Attending: Obstetrics and Gynecology | Admitting: Dietician

## 2023-07-01 ENCOUNTER — Other Ambulatory Visit: Payer: Self-pay | Admitting: Family Medicine

## 2023-07-01 DIAGNOSIS — E119 Type 2 diabetes mellitus without complications: Secondary | ICD-10-CM

## 2023-07-01 DIAGNOSIS — E1165 Type 2 diabetes mellitus with hyperglycemia: Secondary | ICD-10-CM | POA: Insufficient documentation

## 2023-07-01 DIAGNOSIS — O0990 Supervision of high risk pregnancy, unspecified, unspecified trimester: Secondary | ICD-10-CM

## 2023-07-01 NOTE — BH Specialist Note (Unsigned)
 Integrated Behavioral Health via Telemedicine Visit  07/03/2023 Laura Benson [id number]  Number of Integrated Behavioral Health Clinician visits: 2- Second Visit  Session Start time: 1428   Session End time: 1444  Total time in minutes: 16  Referring Provider: Princess Brooks, MD Laura/Family location: Home [facility] Provider location: Center for [facility]  All persons participating in visit: Laura Benson and Laura Benson   Types of Service: Individual psychotherapy and Telephone visit  I connected with Laura Benson and/or Laura Benson n/a via  Telephone or Video Enabled Telemedicine Application  (Video is Caregility application) and verified that I am speaking with the correct person using two identifiers. Discussed confidentiality: Yes   I discussed the limitations of telemedicine and the availability of in person appointments.  Discussed there is a possibility of technology failure and discussed alternative modes of communication if that failure occurs.  I discussed that engaging in this telemedicine visit, they consent to the provision of behavioral healthcare and the services will be billed under their insurance.  Laura and/or legal guardian expressed understanding and consented to Telemedicine visit: Yes   Presenting Concerns: Laura and/or family reports the following symptoms/concerns: Worry over not working, but overall mood improvement the past few weeks; requesting increase in Zoloft  (currently taking 25mg ); pt's goal now is to be prepared for childbirth and postpartum time.  Duration of problem: Current pregnancy; Severity of problem: moderate  Laura and/or Family's Strengths/Protective Factors: Social connections, Concrete supports in place (healthy food, safe environments, etc.), and Sense of purpose  Goals Addressed: Laura will:  Reduce symptoms of: anxiety, depression, and stress    Increase knowledge and/or ability of: healthy habits and stress reduction   Demonstrate ability to: Increase healthy adjustment to current life circumstances and Increase adequate support systems for Laura/family  Progress towards Goals: Ongoing  Interventions: Interventions utilized:  Motivational Interviewing, Psychoeducation and/or Health Education, and Supportive Reflection Standardized Assessments completed: GAD-7 and PHQ 9  Laura and/or Family Response: Laura Laura Benson and Laura Benson   Clinical Assessment/Diagnosis  GAD (generalized anxiety disorder)   Assessment: Laura currently experiencing Generalized anxiety disorder.   Laura may benefit from continued therapeutic intervention   Plan: Follow up with behavioral health clinician on : Call Laura Benson at [phone], as needed. Behavioral recommendations:  -Continue taking prenatal vitamin and Zoloft  as prescribed -Continue daily self-coping strategies (outdoor walks, relaxation breathing, time with supportive people in life) -Continue plan to register for childbirth class of choice --Read through information on After Visit Summary; use as needed and discussed  Referral(s): Integrated Art gallery manager (In Clinic) and [employer]:  childbirth classes  I discussed the assessment and treatment plan with the Laura and/or parent/guardian. They were provided an opportunity to ask questions and all were answered. They agreed with the plan and demonstrated an understanding of the instructions.   They were advised to call back or seek an in-person evaluation if the symptoms worsen or if the condition fails to improve as anticipated.  Georgia Kipper, LCSW     07/02/2023    2:34 PM 03/24/2023    4:41 PM 12/04/2021   11:23 AM 07/01/2021    1:06 PM 06/27/2020   10:27 AM  Depression screen PHQ 2/9  Decreased Interest 1 1 0 3 3  Down, Depressed, Hopeless 0 1 3 3 3   PHQ - 2 Score 1 2 3 6 6    Altered sleeping 0 1 3 3 3   Tired, decreased  energy 1 3 2 2 3   Change in appetite 0 1 0 0 3  Feeling bad or failure about yourself  0 1 3 1 1   Trouble concentrating 0 0 3 2 3   Moving slowly or fidgety/restless 0 0 0 0 1  Suicidal thoughts 0 0  0 0  PHQ-9 Score 2 8 14 14 20   Difficult doing work/chores   Not difficult at all Somewhat difficult Very difficult      07/02/2023    2:36 PM 03/24/2023    4:42 PM 01/23/2016    3:25 PM  GAD 7 : Generalized Anxiety Score  Nervous, Anxious, on Edge 0 2 3  Control/stop worrying 1 1 3   Worry too much - different things 1 1 3   Trouble relaxing 0 1 3  Restless 0 0 3  Easily annoyed or irritable 1 1 3   Afraid - awful might happen 0 1 3  Total GAD 7 Score 3 7 21

## 2023-07-02 ENCOUNTER — Other Ambulatory Visit: Payer: Self-pay

## 2023-07-02 ENCOUNTER — Telehealth: Payer: Self-pay | Admitting: Dietician

## 2023-07-02 ENCOUNTER — Ambulatory Visit: Admitting: Clinical

## 2023-07-02 DIAGNOSIS — F411 Generalized anxiety disorder: Secondary | ICD-10-CM

## 2023-07-02 DIAGNOSIS — E1165 Type 2 diabetes mellitus with hyperglycemia: Secondary | ICD-10-CM

## 2023-07-02 NOTE — Telephone Encounter (Signed)
 Patient called for post pump training follow up last night.  CGM sensor reading was 122 at that time but had experienced a low of 60 during a nap yesterday afternoon.  No changes made to pump settings.  Patient with no questions.  Reviewed CGM today - report below.  Fasting this am was 70 and post meal of 155-180.   Called  patient to follow up today.  Patient was not available.  Asked that she return my call for questions or lows.  Cydne Doyne, RD, LDN, CDCES, DipACLM

## 2023-07-03 ENCOUNTER — Telehealth: Payer: Self-pay | Admitting: Dietician

## 2023-07-03 NOTE — Telephone Encounter (Signed)
 Called patient for pump follow up.  Dexcom Clarity report reviewed.  Instructed patient to change the ICR from 17>>15.  She was able to do this without issues.  Instructed patient to call for questions.  Cydne Doyne, RD, LDN, CDCES, DipACLM

## 2023-07-03 NOTE — Progress Notes (Signed)
 Start time:  0930   End time:  1130  Patient is here today 07/01/2023 for training on the Omnipod 5 Insulin  Pump.  It was confirmed that the patient was aware of the following: Blood glucose (BG) testing Treating hypoglycemia Hyperglycemia Carbohydrate counting   (Patient will be using carbohydrate counting with use of this pump.) Sick day management  Patient was instructed on the following: Have the following supplies available to be prepared: Omnipod PDM and pods Insulin  and syringe Blood glucose meter, strips, lancets, lancing device Glucose tablets/fast acting source of carbohydrate/Glucagon  System Overview Communication process/distance Storage guidelines  Pod: Fill port/adhesive/needle cap/pink slide insert/Waterproof  PDM Battery/button layout/wireless updates (software updates)  PDM Settings Pump Therapy Order Form with settings below Basic settings - Personalized lock screen/time/time zone/date/date format Basal settings Max basal 1 Basal rates 0.5 Temp basal  ON Bolus settings Target Blood glucose  110 Insulin  to carb (IC) ratio  17 (changed to 15 on 07/03/2023 Correction factor 110 mg/dL/unit for BG above 829 mg/dL Minimum blood glucose for bolus calculations 70 mg/dL Reverse correction  ON Duration of insulin  action  3 Extended bolus  ON Max bolus  10 Patient was able to accurately enter pump settings into PDM and connect the CGM with the pump.  Pod Activation Change Pod  Room temperature insulin  Fill syringe - min/max amounts DO NOT prefill Pod Site selection/rotation & prep Automated cannula insertion - check infusion site/viewing window & pink slide insert Blood glucose reminder 1.5 hours after insertion When to change POD Patient was able to place pod and view insert.  Home Screen Status Bar, Menu Icon, Notification/Alarms Tabs Dashboard - IOB (if Calculator ON) - Instructed to leave calculator on Basal (Temp Basal if Temp Basal running) Pod  Info - View Pod detail Last Bolus, Last BG Bolus button  Menu icon PDM function - Temp Basal, Pod, Enter BG, Suspend Manage Programs & Presets - Basal programs, temp basal presets, bolus presets Custom Foods(Calorie King app, other tools for carbohydrate counting) History - Notifications & Alarms, Insulin  & BG History Settings - PDM Device, Pod sites, Reminders, Blood Glucose, Basal & Temp Basal, Bolus About  Advanced Features (to be discussed at a further date based on patient needs) Extended bolus Temp basal rate Additional basal programs Presets - Temp basal/Bolus presets  Troubleshooting Hypoglycemia Sick day management resource Hyperglycemia & Ketones  Notifications & Alarms Custom reminders Pod Expiration alert Low Reservoir alert  Advisory & Hazard Alarms Advisory alarms - intermittent tones - response required Hazard alarm - Continuous vibration, and Tone - urgent attention required - Pod Expired, Empty Reservoir, Occlusion, Pod Error, Auto Off, PDM Error, System Error  Ongoing Success [employer] invitation provided Omnipod app(s) Register for [employer] 24/7 Customer Care  [phone] Reviewed User Guide Showed patient Customer's Bill of Rights and Responsibilities and instructed them to review.  Handouts Provided: Meal plan card Omnipod Carb counting handout  Patient to contact me via MyChart or phone.

## 2023-07-06 ENCOUNTER — Other Ambulatory Visit: Payer: Self-pay

## 2023-07-06 ENCOUNTER — Ambulatory Visit: Admitting: Family Medicine

## 2023-07-06 VITALS — BP 138/94 | HR 85 | Wt >= 6400 oz

## 2023-07-06 DIAGNOSIS — O99342 Other mental disorders complicating pregnancy, second trimester: Secondary | ICD-10-CM

## 2023-07-06 DIAGNOSIS — O24112 Pre-existing diabetes mellitus, type 2, in pregnancy, second trimester: Secondary | ICD-10-CM | POA: Diagnosis not present

## 2023-07-06 DIAGNOSIS — O10919 Unspecified pre-existing hypertension complicating pregnancy, unspecified trimester: Secondary | ICD-10-CM

## 2023-07-06 DIAGNOSIS — O99842 Bariatric surgery status complicating pregnancy, second trimester: Secondary | ICD-10-CM

## 2023-07-06 DIAGNOSIS — F419 Anxiety disorder, unspecified: Secondary | ICD-10-CM

## 2023-07-06 DIAGNOSIS — G4733 Obstructive sleep apnea (adult) (pediatric): Secondary | ICD-10-CM

## 2023-07-06 DIAGNOSIS — E1165 Type 2 diabetes mellitus with hyperglycemia: Secondary | ICD-10-CM

## 2023-07-06 DIAGNOSIS — O99352 Diseases of the nervous system complicating pregnancy, second trimester: Secondary | ICD-10-CM

## 2023-07-06 DIAGNOSIS — O24414 Gestational diabetes mellitus in pregnancy, insulin controlled: Secondary | ICD-10-CM

## 2023-07-06 DIAGNOSIS — O365920 Maternal care for other known or suspected poor fetal growth, second trimester, not applicable or unspecified: Secondary | ICD-10-CM

## 2023-07-06 DIAGNOSIS — R768 Other specified abnormal immunological findings in serum: Secondary | ICD-10-CM

## 2023-07-06 DIAGNOSIS — Z9884 Bariatric surgery status: Secondary | ICD-10-CM

## 2023-07-06 DIAGNOSIS — O99212 Obesity complicating pregnancy, second trimester: Secondary | ICD-10-CM

## 2023-07-06 DIAGNOSIS — E119 Type 2 diabetes mellitus without complications: Secondary | ICD-10-CM

## 2023-07-06 DIAGNOSIS — Z3A26 26 weeks gestation of pregnancy: Secondary | ICD-10-CM

## 2023-07-06 DIAGNOSIS — O0990 Supervision of high risk pregnancy, unspecified, unspecified trimester: Secondary | ICD-10-CM

## 2023-07-06 DIAGNOSIS — O10012 Pre-existing essential hypertension complicating pregnancy, second trimester: Secondary | ICD-10-CM

## 2023-07-06 MED ORDER — ASPIRIN 81 MG PO TBEC: 81.0000 mg | DELAYED_RELEASE_TABLET | Freq: Two times a day (BID) | ORAL | 4 refills | Status: DC

## 2023-07-06 MED ORDER — SERTRALINE HCL 50 MG PO TABS: 50.0000 mg | ORAL_TABLET | Freq: Every day | ORAL | 4 refills | Status: DC

## 2023-07-06 MED ORDER — DEXCOM G7 SENSOR MISC: 12 refills | Status: DC

## 2023-07-06 NOTE — Progress Notes (Signed)
 PRENATAL VISIT NOTE  Subjective:  Laura Benson is a 33 y.o. G1P0 at [redacted]w[redacted]d being seen today for ongoing prenatal care.  She is currently monitored for the following issues for this high-risk pregnancy and has Severe obesity (BMI >= 40) (HCC); Depression, recurrent (HCC); Chronic hypertension in pregnancy; Obstructive sleep apnea; Umbilical hernia without obstruction and without gangrene; Asthma; Uncontrolled type 2 diabetes mellitus with hyperglycemia (HCC); S/P laparoscopic sleeve gastrectomy; Anxiety; Lumbar radiculopathy; Complication of anesthesia; GAD (generalized anxiety disorder); MDD (major depressive disorder), recurrent, severe, with psychosis (HCC); Supervision of high risk pregnancy, antepartum; Morbid obesity (HCC); Anti M Antibody positive; Alpha thalassemia silent carrier; Poor fetal growth affecting management of mother in second trimester; and Gestational diabetes mellitus (GDM) requiring insulin  on their problem list.  Patient reports no complaints.  Contractions: Irritability. Vag. Bleeding: None.  Movement: Present. Denies leaking of fluid.   The following portions of the patient's history were reviewed and updated as appropriate: allergies, current medications, past family history, past medical history, past social history, past surgical history and problem list.   Objective:    Vitals:   07/06/23 1356  BP: (!) 138/94  Pulse: 85  Weight: (!) 405 lb (183.7 kg)    Fetal Status:  Fetal Heart Rate (bpm): 150 (US ) Fundal Height: 34 cm Movement: Present Presentation: Transverse  General: Alert, oriented and cooperative. Patient is in no acute distress.  Skin: Skin is warm and dry. No rash noted.   Cardiovascular: Normal heart rate noted  Respiratory: Normal respiratory effort, no problems with respiration noted  Abdomen: Soft, gravid, appropriate for gestational age.  Pain/Pressure: Present     Pelvic: Cervical exam deferred        Extremities: Normal range of motion.   Edema: Trace  Mental Status: Normal mood and affect. Normal behavior. Normal judgment and thought content.   Assessment and Plan:  Pregnancy: G1P0 at [redacted]w[redacted]d 1. Gestational diabetes mellitus (GDM) requiring insulin  (Primary) - Fetal Echo scheduled 6/20 - aspirin  EC 81 MG tablet; Take 1 tablet (81 mg total) by mouth 2 (two) times daily. Swallow whole.  Dispense: 90 tablet; Refill: 4 - Continuous Glucose Sensor (DEXCOM G7 SENSOR) MISC; Apply one sensor every 10 days ICD10: O24.414 (Gestational diabetes, insulin  dependent)  Dispense: 3 each; Refill: 12  2. Chronic hypertension in pregnancy BP mild range today Appt with Tobb in August - aspirin  EC 81 MG tablet; Take 1 tablet (81 mg total) by mouth 2 (two) times daily. Swallow whole.  Dispense: 90 tablet; Refill: 4  3. Anti M Antibody positive  4. S/P laparoscopic sleeve gastrectomy  5. Poor fetal growth affecting management of mother in second trimester, single or unspecified fetus Has growth US  scheduled  6. Supervision of high risk pregnancy, antepartum Up to date FH is difficult to assess due to habitus FHR -- needed US  due to habitus  - Continuous Glucose Sensor (DEXCOM G7 SENSOR) MISC; Apply one sensor every 10 days ICD10: O24.414 (Gestational diabetes, insulin  dependent)  Dispense: 3 each; Refill: 12  7. Obstructive sleep apnea Needs to rescheduled her sleep study appt  8. [redacted] weeks gestation of pregnancy   9. Uncontrolled type 2 diabetes mellitus with hyperglycemia (HCC) - Continuous Glucose Sensor (DEXCOM G7 SENSOR) MISC; Apply one sensor every 10 days ICD10: O24.414 (Gestational diabetes, insulin  dependent)  Dispense: 3 each; Refill: 12  10. Diabetes mellitus without complication (HCC) - Continuous Glucose Sensor (DEXCOM G7 SENSOR) MISC; Apply one sensor every 10 days ICD10: O24.414 (Gestational diabetes, insulin  dependent)  Dispense: 3 each; Refill: 12  11. Severe obesity (BMI >= 40) (HCC) TWG=31 lb (14.1 kg) which is  above goal  12. Anxiety Meeting with IBH and had increased dose. Stable and feeling well on higher dose.  - sertraline  (ZOLOFT ) 50 MG tablet; Take 1 tablet (50 mg total) by mouth daily.  Dispense: 90 tablet; Refill: 4  Preterm labor symptoms and general obstetric precautions including but not limited to vaginal bleeding, contractions, leaking of fluid and fetal movement were reviewed in detail with the patient. Please refer to After Visit Summary for other counseling recommendations.   Return in about 3 weeks (around 07/27/2023) for Routine prenatal care, Mom+Baby Combined Care.  Future Appointments  Date Time Provider Department Center  07/20/2023  1:00 PM [facility] PROVIDER 1 [facility]  07/20/2023  1:30 PM [facility] US4 [facility]  07/24/2023  8:50 AM [facility] LAB [facility]  07/24/2023  9:15 AM Ferdie Housekeeper, MD [facility]  08/14/2023  9:35 AM Ferdie Housekeeper, MD [facility]  08/17/2023 10:00 AM [facility] PROVIDER 1 [facility]  08/17/2023 10:30 AM [facility] US2 [facility]  09/04/2023  2:20 PM Tobb, Chyrl Crawford, DO [facility] None    Abner Ables, MD

## 2023-07-07 ENCOUNTER — Encounter: Payer: Self-pay | Admitting: Family Medicine

## 2023-07-07 ENCOUNTER — Telehealth: Payer: Self-pay | Admitting: Dietician

## 2023-07-07 NOTE — Telephone Encounter (Signed)
 Called patient who was unavailable.    Dexcom Clarity report reviewed.  CGM Results from download: 07/07/2023  % Time CGM active:   84 %   (Goal >70%)  Average glucose:   118 mg/dL for 14 days  Glucose management indicator:   N/a %  Time in range (70-180 mg/dL):   81 %   (Goal >16%)  Time High (181-250 mg/dL):   19 %   (Goal < 10%)  Time Very High (>250 mg/dL):    0 %   (Goal < 5%)  Time Low (54-69 mg/dL):   0 %   (Goal <9%)  Time Very Low (<54 mg/dL):   0 %   (Goal <6%)  %CV (glucose variability)    20.7 %  (Goal <36%)   Fasting sensor reading 86-110. Post meal 135-170  Text sent to patient to make the following changes which she has completed. ICR:  15>>13  Cydne Doyne, RD, LDN, CDCES, DipACLM

## 2023-07-16 ENCOUNTER — Other Ambulatory Visit: Payer: Self-pay

## 2023-07-20 ENCOUNTER — Other Ambulatory Visit: Payer: Self-pay | Admitting: Family Medicine

## 2023-07-20 ENCOUNTER — Ambulatory Visit: Attending: Maternal & Fetal Medicine | Admitting: Obstetrics

## 2023-07-20 ENCOUNTER — Ambulatory Visit

## 2023-07-20 ENCOUNTER — Other Ambulatory Visit: Payer: Self-pay | Admitting: *Deleted

## 2023-07-20 VITALS — BP 138/80 | HR 88

## 2023-07-20 DIAGNOSIS — O99513 Diseases of the respiratory system complicating pregnancy, third trimester: Secondary | ICD-10-CM | POA: Insufficient documentation

## 2023-07-20 DIAGNOSIS — Z794 Long term (current) use of insulin: Secondary | ICD-10-CM | POA: Insufficient documentation

## 2023-07-20 DIAGNOSIS — O99843 Bariatric surgery status complicating pregnancy, third trimester: Secondary | ICD-10-CM | POA: Diagnosis not present

## 2023-07-20 DIAGNOSIS — O24319 Unspecified pre-existing diabetes mellitus in pregnancy, unspecified trimester: Secondary | ICD-10-CM

## 2023-07-20 DIAGNOSIS — Z148 Genetic carrier of other disease: Secondary | ICD-10-CM | POA: Insufficient documentation

## 2023-07-20 DIAGNOSIS — O10013 Pre-existing essential hypertension complicating pregnancy, third trimester: Secondary | ICD-10-CM | POA: Diagnosis not present

## 2023-07-20 DIAGNOSIS — E1165 Type 2 diabetes mellitus with hyperglycemia: Secondary | ICD-10-CM | POA: Diagnosis present

## 2023-07-20 DIAGNOSIS — J45909 Unspecified asthma, uncomplicated: Secondary | ICD-10-CM | POA: Diagnosis not present

## 2023-07-20 DIAGNOSIS — O24113 Pre-existing diabetes mellitus, type 2, in pregnancy, third trimester: Secondary | ICD-10-CM

## 2023-07-20 DIAGNOSIS — F419 Anxiety disorder, unspecified: Secondary | ICD-10-CM | POA: Insufficient documentation

## 2023-07-20 DIAGNOSIS — O10913 Unspecified pre-existing hypertension complicating pregnancy, third trimester: Secondary | ICD-10-CM | POA: Diagnosis not present

## 2023-07-20 DIAGNOSIS — O10919 Unspecified pre-existing hypertension complicating pregnancy, unspecified trimester: Secondary | ICD-10-CM

## 2023-07-20 DIAGNOSIS — O99213 Obesity complicating pregnancy, third trimester: Secondary | ICD-10-CM

## 2023-07-20 DIAGNOSIS — O0990 Supervision of high risk pregnancy, unspecified, unspecified trimester: Secondary | ICD-10-CM

## 2023-07-20 DIAGNOSIS — Z3A28 28 weeks gestation of pregnancy: Secondary | ICD-10-CM

## 2023-07-20 DIAGNOSIS — E669 Obesity, unspecified: Secondary | ICD-10-CM

## 2023-07-20 DIAGNOSIS — Z79899 Other long term (current) drug therapy: Secondary | ICD-10-CM | POA: Diagnosis not present

## 2023-07-20 DIAGNOSIS — Z9641 Presence of insulin pump (external) (internal): Secondary | ICD-10-CM | POA: Diagnosis not present

## 2023-07-20 DIAGNOSIS — O99343 Other mental disorders complicating pregnancy, third trimester: Secondary | ICD-10-CM | POA: Insufficient documentation

## 2023-07-20 DIAGNOSIS — O24414 Gestational diabetes mellitus in pregnancy, insulin controlled: Secondary | ICD-10-CM

## 2023-07-20 DIAGNOSIS — O24119 Pre-existing diabetes mellitus, type 2, in pregnancy, unspecified trimester: Secondary | ICD-10-CM

## 2023-07-20 NOTE — Progress Notes (Signed)
 MFM Consult Note  Laura Benson is currently at 28 weeks and 2 days.  She has been followed due to maternal obesity with a BMI of 62.4, pregestational diabetes treated with insulin , chronic hypertension treated with amlodipine  and labetalol , and history of gastric bypass surgery.    Her glycemic control is now improved after she received a Dexcom continuous glucose monitor and an insulin  pump.  Her blood pressure today was 138/80.    Her fetal echocardiogram performed with [facility] pediatric cardiology was limited.  A four-chamber view of the fetal heart was within normal limits.    She denies any problems since her last exam.  On today's exam, the overall EFW of 2 pounds 7 ounces measures at the 16th percentile for her gestational age.  There is normal amniotic fluid noted.  The views of the fetal anatomy remain limited today due to extreme maternal body habitus.  The patient understands that we will continue to assess the views of the fetal anatomy during her future ultrasound exams.  However, if we are unable to clear all of the views of the fetal anatomy during her prenatal ultrasound exams, her baby will have to be examined after birth to determine if any abnormalities are present.    We will continue to follow her with growth ultrasounds throughout her pregnancy.    We will start weekly fetal testing at 32 weeks.    The goal for her delivery would be at around 37 weeks.    She will return in 4 weeks for another growth scan.  The patient stated that all of her questions were answered today.  A total of 20 minutes was spent counseling and coordinating the care for this patient.  Greater than 50% of the time was spent in direct face-to-face contact.

## 2023-07-21 ENCOUNTER — Telehealth: Payer: Self-pay | Admitting: Dietician

## 2023-07-21 NOTE — Telephone Encounter (Signed)
 Dexcom Clarity report reviewed and posted below.   Will made no pump changes today. Patient called and message left with patient via text. Current ICR is 1:13.  Leita Constable, RD, LDN, CDCES, DipACLM

## 2023-07-24 ENCOUNTER — Other Ambulatory Visit: Payer: Self-pay

## 2023-07-24 ENCOUNTER — Other Ambulatory Visit

## 2023-07-24 ENCOUNTER — Ambulatory Visit: Admitting: Family Medicine

## 2023-07-24 VITALS — BP 149/93 | HR 90 | Wt >= 6400 oz

## 2023-07-24 DIAGNOSIS — Z3A28 28 weeks gestation of pregnancy: Secondary | ICD-10-CM

## 2023-07-24 DIAGNOSIS — R768 Other specified abnormal immunological findings in serum: Secondary | ICD-10-CM

## 2023-07-24 DIAGNOSIS — O365930 Maternal care for other known or suspected poor fetal growth, third trimester, not applicable or unspecified: Secondary | ICD-10-CM | POA: Diagnosis not present

## 2023-07-24 DIAGNOSIS — O99353 Diseases of the nervous system complicating pregnancy, third trimester: Secondary | ICD-10-CM

## 2023-07-24 DIAGNOSIS — O24419 Gestational diabetes mellitus in pregnancy, unspecified control: Secondary | ICD-10-CM

## 2023-07-24 DIAGNOSIS — O10013 Pre-existing essential hypertension complicating pregnancy, third trimester: Secondary | ICD-10-CM

## 2023-07-24 DIAGNOSIS — G4733 Obstructive sleep apnea (adult) (pediatric): Secondary | ICD-10-CM

## 2023-07-24 DIAGNOSIS — O0990 Supervision of high risk pregnancy, unspecified, unspecified trimester: Secondary | ICD-10-CM

## 2023-07-24 DIAGNOSIS — O99843 Bariatric surgery status complicating pregnancy, third trimester: Secondary | ICD-10-CM | POA: Diagnosis not present

## 2023-07-24 DIAGNOSIS — Z9884 Bariatric surgery status: Secondary | ICD-10-CM

## 2023-07-24 DIAGNOSIS — F419 Anxiety disorder, unspecified: Secondary | ICD-10-CM

## 2023-07-24 DIAGNOSIS — O365920 Maternal care for other known or suspected poor fetal growth, second trimester, not applicable or unspecified: Secondary | ICD-10-CM

## 2023-07-24 DIAGNOSIS — O99343 Other mental disorders complicating pregnancy, third trimester: Secondary | ICD-10-CM

## 2023-07-24 DIAGNOSIS — O24414 Gestational diabetes mellitus in pregnancy, insulin controlled: Secondary | ICD-10-CM

## 2023-07-24 DIAGNOSIS — O10919 Unspecified pre-existing hypertension complicating pregnancy, unspecified trimester: Secondary | ICD-10-CM

## 2023-07-24 MED ORDER — OMNIPOD 5 DEXG7G6 PODS GEN 5 MISC: 1.0000 | 8 refills | Status: DC

## 2023-07-24 MED ORDER — LABETALOL HCL 200 MG PO TABS: 200.0000 mg | ORAL_TABLET | Freq: Two times a day (BID) | ORAL | 3 refills | Status: DC

## 2023-07-24 MED ORDER — INSULIN LISPRO 100 UNIT/ML IJ SOLN: INTRAMUSCULAR | 11 refills | Status: DC

## 2023-07-24 NOTE — Progress Notes (Signed)
 PRENATAL VISIT NOTE  Subjective:  Laura Benson is a 33 y.o. G1P0 at [redacted]w[redacted]d being seen today for ongoing prenatal care.  She is currently monitored for the following issues for this high-risk pregnancy and has Severe obesity (BMI >= 40) (HCC); Depression, recurrent (HCC); Chronic hypertension in pregnancy; Obstructive sleep apnea; Umbilical hernia without obstruction and without gangrene; Asthma; Uncontrolled type 2 diabetes mellitus with hyperglycemia (HCC); S/P laparoscopic sleeve gastrectomy; Anxiety; Lumbar radiculopathy; Complication of anesthesia; GAD (generalized anxiety disorder); MDD (major depressive disorder), recurrent, severe, with psychosis (HCC); Supervision of high risk pregnancy, antepartum; Morbid obesity (HCC); Anti M Antibody positive; Alpha thalassemia silent carrier; Poor fetal growth affecting management of mother in second trimester; and Gestational diabetes mellitus (GDM) requiring insulin  on their problem list.  Patient reports no bleeding, no contractions, no cramping, and no leaking.  Contractions: Irritability. Vag. Bleeding: None.  Movement: Present. Denies leaking of fluid.   The following portions of the patient's history were reviewed and updated as appropriate: allergies, current medications, past family history, past medical history, past social history, past surgical history and problem list.   Objective:    Vitals:   07/24/23 0929  Weight: (!) 405 lb 8 oz (183.9 kg)    Fetal Status:      Movement: Present    General: Alert, oriented and cooperative. Patient is in no acute distress.  Skin: Skin is warm and dry. No rash noted.   Cardiovascular: Normal heart rate noted  Respiratory: Normal respiratory effort, no problems with respiration noted  Abdomen: Soft, gravid, appropriate for gestational age.  Pain/Pressure: Present     Pelvic: Cervical exam deferred        Extremities: Normal range of motion.  Edema: Trace  Mental Status: Normal mood and  affect. Normal behavior. Normal judgment and thought content.   Assessment and Plan:  Pregnancy: G1P0 at [redacted]w[redacted]d 1. Supervision of high risk pregnancy, antepartum (Primary) FHR appropriate today  2. Gestational diabetes mellitus (GDM) requiring insulin  Patient currently on insulin  with insulin  pump and Dexcom monitor.  Fasting blood sugars all in the 80s.  A majority of postprandials less than 120.  She is doing really well counting her carbs and watching what she eats.  Congratulated her on this.  Continue to monitor.  3. Chronic hypertension in pregnancy Mild range elevation today.  Patient has not yet taken blood pressure medications. Continue amlodipine  and labetalol   4. Anti M Antibody positive  5. S/P laparoscopic sleeve gastrectomy  6. Poor fetal growth affecting management of mother in second trimester, single or unspecified fetus Following with MFM, visit on 6/23 showing EFW of 16th percentile  7. Gestational diabetes mellitus (GDM) affecting pregnancy, antepartum - insulin  lispro (HUMALOG ) 100 UNIT/ML injection; For use with insulin  pump. Basal rate: 0.5 u/hr. Bolus: 5 units TID with meals.  Dispense: 10 mL; Refill: 11  8. Anxiety Currently seeing IBH.  On Zoloft  and doing well.  9. Obstructive sleep apnea Needs to reschedule sleep study  10. [redacted] weeks gestation of pregnancy - HIV Antibody (routine testing w rflx); Future - CBC; Future - RPR; Future  Preterm labor symptoms and general obstetric precautions including but not limited to vaginal bleeding, contractions, leaking of fluid and fetal movement were reviewed in detail with the patient. Please refer to After Visit Summary for other counseling recommendations.   No follow-ups on file.  Future Appointments  Date Time Provider Department Center  07/24/2023 11:00 AM [facility]  08/14/2023  9:35 AM [name] Rush  V, MD [facility]  08/17/2023 10:00 AM [facility] PROVIDER 1 [facility]  08/17/2023 10:30 AM  [facility] US2 [facility]  08/24/2023 10:00 AM [facility] PROVIDER 1 [facility]  08/24/2023 10:30 AM [facility] US4 [facility]  08/31/2023  9:00 AM [facility] PROVIDER 1 [facility]  08/31/2023  9:30 AM [facility] US3 [facility]  09/04/2023  2:20 PM Tobb, Kardie, DO [facility] None  09/07/2023  9:00 AM [facility] PROVIDER 1 [facility]  09/07/2023  9:30 AM [facility] US3 [facility]    Norleen LULLA Rover, MD

## 2023-07-25 LAB — SYPHILIS: RPR W/REFLEX TO RPR TITER AND TREPONEMAL ANTIBODIES, TRADITIONAL SCREENING AND DIAGNOSIS ALGORITHM: RPR Ser Ql: NONREACTIVE

## 2023-07-25 LAB — CBC
Hematocrit: 39.3 % (ref 34.0–46.6)
Hemoglobin: 12 g/dL (ref 11.1–15.9)
MCH: 24.3 pg — ABNORMAL LOW (ref 26.6–33.0)
MCHC: 30.5 g/dL — ABNORMAL LOW (ref 31.5–35.7)
MCV: 80 fL (ref 79–97)
Platelets: 246 x10E3/uL (ref 150–450)
RBC: 4.94 x10E6/uL (ref 3.77–5.28)
RDW: 14.8 % (ref 11.7–15.4)
WBC: 5.8 x10E3/uL (ref 3.4–10.8)

## 2023-07-25 LAB — HIV ANTIBODY (ROUTINE TESTING W REFLEX): HIV Screen 4th Generation wRfx: NONREACTIVE

## 2023-08-07 ENCOUNTER — Encounter: Payer: Self-pay | Admitting: Family Medicine

## 2023-08-12 ENCOUNTER — Inpatient Hospital Stay (HOSPITAL_COMMUNITY)
Admission: AD | Admit: 2023-08-12 | Discharge: 2023-08-13 | Disposition: A | Discharge status: Home / Self Care | Attending: Obstetrics & Gynecology | Admitting: Obstetrics & Gynecology

## 2023-08-12 ENCOUNTER — Encounter (HOSPITAL_COMMUNITY): Payer: Self-pay | Admitting: Obstetrics & Gynecology

## 2023-08-12 DIAGNOSIS — K8020 Calculus of gallbladder without cholecystitis without obstruction: Secondary | ICD-10-CM | POA: Insufficient documentation

## 2023-08-12 DIAGNOSIS — O10013 Pre-existing essential hypertension complicating pregnancy, third trimester: Secondary | ICD-10-CM | POA: Insufficient documentation

## 2023-08-12 DIAGNOSIS — Z3A31 31 weeks gestation of pregnancy: Secondary | ICD-10-CM | POA: Diagnosis not present

## 2023-08-12 DIAGNOSIS — R519 Headache, unspecified: Secondary | ICD-10-CM | POA: Diagnosis present

## 2023-08-12 DIAGNOSIS — O26613 Liver and biliary tract disorders in pregnancy, third trimester: Secondary | ICD-10-CM | POA: Diagnosis not present

## 2023-08-12 DIAGNOSIS — R1010 Upper abdominal pain, unspecified: Secondary | ICD-10-CM | POA: Diagnosis not present

## 2023-08-12 DIAGNOSIS — O4703 False labor before 37 completed weeks of gestation, third trimester: Secondary | ICD-10-CM | POA: Diagnosis not present

## 2023-08-12 DIAGNOSIS — K760 Fatty (change of) liver, not elsewhere classified: Secondary | ICD-10-CM | POA: Insufficient documentation

## 2023-08-12 DIAGNOSIS — R1013 Epigastric pain: Secondary | ICD-10-CM | POA: Diagnosis present

## 2023-08-12 LAB — URINALYSIS, ROUTINE W REFLEX MICROSCOPIC
Bilirubin Urine: NEGATIVE
Glucose, UA: NEGATIVE mg/dL
Hgb urine dipstick: NEGATIVE
Ketones, ur: NEGATIVE mg/dL
Leukocytes,Ua: NEGATIVE
Nitrite: NEGATIVE
Protein, ur: NEGATIVE mg/dL
Specific Gravity, Urine: 1.012 (ref 1.005–1.030)
pH: 6 (ref 5.0–8.0)

## 2023-08-12 LAB — CBC
HCT: 36.7 % (ref 36.0–46.0)
Hemoglobin: 11.7 g/dL — ABNORMAL LOW (ref 12.0–15.0)
MCH: 24.2 pg — ABNORMAL LOW (ref 26.0–34.0)
MCHC: 31.9 g/dL (ref 30.0–36.0)
MCV: 76 fL — ABNORMAL LOW (ref 80.0–100.0)
Platelets: 250 K/uL (ref 150–400)
RBC: 4.83 MIL/uL (ref 3.87–5.11)
RDW: 14.8 % (ref 11.5–15.5)
WBC: 6.1 K/uL (ref 4.0–10.5)
nRBC: 0 % (ref 0.0–0.2)

## 2023-08-12 LAB — PROTEIN / CREATININE RATIO, URINE
Creatinine, Urine: 93 mg/dL
Protein Creatinine Ratio: 0.1 mg/mg{creat} (ref 0.00–0.15)
Total Protein, Urine: 9 mg/dL

## 2023-08-12 MED ORDER — LABETALOL HCL 100 MG PO TABS
200.0000 mg | ORAL_TABLET | Freq: Once | ORAL | Status: AC
  Administered 2023-08-12: 200 mg via ORAL
  Filled 2023-08-12: qty 2

## 2023-08-12 NOTE — MAU Note (Signed)
 Laura Benson is a 33 y.o. at [redacted]w[redacted]d here in MAU reporting: for 2 days feeling pressure and abdominal pain. Pressure worsens with movement. Denies VB or LOF. +FM. Has not taken medication for pain. CHTN - has not taken tonight's dose of her blood pressure medications. Reports HA and epigastric pain. Denies visual disturbances.   LMP: NA Onset of complaint: 2 days  Pain score: 8 -  abdomen; 8 - HA; 8 - epigastric  Vitals:   08/12/23 2218  BP: (!) 178/78  Pulse: 82  Resp: 19  Temp: 99.1 F (37.3 C)  SpO2: 99%     FHT: 148  Lab orders placed from triage: UA

## 2023-08-12 NOTE — MAU Provider Note (Signed)
 Chief Complaint:  Abdominal Pain   Event Date/Time   First Provider Initiated Contact with Patient 08/12/23 2337     HPI: Laura Benson is a 33 y.o. G1P0 at 63w4dwho presents to maternity admissions reporting upper abdominal pain, pressure, headache.  DId not take evening dose of blood pressure medicine. . She reports good fetal movement, denies LOF, vaginal bleeding, urinary symptoms,  dizziness, n/v, diarrhea, or fever/chills.    Abdominal Pain This is a new problem. The current episode started today. The pain is located in the epigastric region and RUQ. The quality of the pain is dull. Pertinent negatives include no diarrhea or dysuria.   RN Note:  Laura Benson is a 33 y.o. at [redacted]w[redacted]d here in MAU reporting: for 2 days feeling pressure and abdominal pain. Pressure worsens with movement. Denies VB or LOF. +FM. Has not taken medication for pain. CHTN - has not taken tonight's dose of her blood pressure medications. Reports HA and epigastric pain. Denies visual disturbances.    LMP: NA Onset of complaint: 2 days  Pain score: 8 -  abdomen; 8 - HA; 8 - epigastric      Past Medical History: Past Medical History:  Diagnosis Date   Anxiety    Asthma    Complication of anesthesia    Take for a while to wake up   Depression    Diabetes mellitus without complication (HCC)    Hypertension    Morbid obesity (HCC)    PCOS (polycystic ovarian syndrome)    Sleep apnea    CPAP   Tachycardia     Past obstetric history: OB History  Gravida Para Term Preterm AB Living  1       SAB IAB Ectopic Multiple Live Births          # Outcome Date GA Lbr Len/2nd Weight Sex Type Anes PTL Lv  1 Current             Past Surgical History: Past Surgical History:  Procedure Laterality Date   ADENOIDECTOMY     LAPAROSCOPIC GASTRIC SLEEVE RESECTION N/A 04/16/2020   Procedure: LAPAROSCOPIC GASTRIC SLEEVE RESECTION;  Surgeon: Tanda Locus, MD;  Location: [facility];  Service: General;  Laterality:  N/A;   TONSILLECTOMY     UPPER GI ENDOSCOPY N/A 04/16/2020   Procedure: UPPER GI ENDOSCOPY;  Surgeon: Tanda Locus, MD;  Location: [facility];  Service: General;  Laterality: N/A;    Family History: Family History  Problem Relation Age of Onset   Stroke Mother    Hypertension Mother    Diabetes Mother    Hypertension Father    Diabetes Father    Colon cancer Father    Diabetes Sister    Hypertension Maternal Grandmother    Hypertension Paternal Grandmother    Diabetes Paternal Grandmother    Breast cancer Cousin     Social History: Social History   Tobacco Use   Smoking status: Former    Types: Cigarettes   Smokeless tobacco: Never  Vaping Use   Vaping status: Never Used  Substance Use Topics   Alcohol use: No   Drug use: Never    Allergies: No Known Allergies  Meds:  Medications Prior to Admission  Medication Sig Dispense Refill Last Dose/Taking   amLODipine  (NORVASC ) 10 MG tablet Take 1 tablet (10 mg total) by mouth daily. 90 tablet 3 08/12/2023 at 12:30 PM   aspirin  EC 81 MG tablet Take 1 tablet (81 mg total) by mouth 2 (two) times daily.  Swallow whole. 90 tablet 4 08/12/2023 at 12:30 PM   labetalol  (NORMODYNE ) 200 MG tablet Take 1 tablet (200 mg total) by mouth 2 (two) times daily. 60 tablet 3 08/12/2023 at 12:30 PM   Prenatal Vit-Fe Fumarate-FA (PRENATAL VITAMINS PO) Take 1 tablet by mouth daily at 6 (six) AM.   08/12/2023 at 12:30 PM   sertraline  (ZOLOFT ) 50 MG tablet Take 1 tablet (50 mg total) by mouth daily. 90 tablet 4 08/12/2023 at 12:30 PM   acetaminophen -caffeine (EXCEDRIN  TENSION HEADACHE) 500-65 MG TABS per tablet Take 2 tablets by mouth every 8 (eight) hours as needed. 60 tablet 0    albuterol  (VENTOLIN  HFA) 108 (90 Base) MCG/ACT inhaler Inhale 2 puffs into the lungs every 6 (six) hours as needed for wheezing or shortness of breath. 6.7 g 0    Continuous Glucose Receiver (DEXCOM G7 RECEIVER) DEVI 1 Units by Does not apply route daily. 1 each 1    Continuous  Glucose Sensor (DEXCOM G7 SENSOR) MISC Apply one sensor every 10 days ICD10: O24.414 (Gestational diabetes, insulin  dependent) 3 each 12    fluticasone  (FLONASE ) 50 MCG/ACT nasal spray Place 2 sprays into both nostrils daily. 9.9 mL 0    Insulin  Disposable Pump (OMNIPOD 5 DEXG7G6 INTRO GEN 5) KIT 1 Device by Does not apply route every 3 (three) days. 1 kit 0    Insulin  Disposable Pump (OMNIPOD 5 DEXG7G6 PODS GEN 5) MISC 1 Device by Does not apply route every 3 (three) days. 3 each 8    insulin  lispro (HUMALOG ) 100 UNIT/ML injection For use with insulin  pump. Basal rate: 0.5 u/hr. Bolus: 5 units TID with meals. 10 mL 11    Insulin  Pen Needle (BD PEN NEEDLE NANO 2ND GEN) 32G X 4 MM MISC 1 Units by Does not apply route daily. (Patient not taking: Reported on 06/23/2023) 100 each 1    promethazine  (PHENERGAN ) 25 MG tablet Take 1 tablet (25 mg total) by mouth every 6 (six) hours as needed for nausea or vomiting. 30 tablet 0     I have reviewed patient's Past Medical Hx, Surgical Hx, Family Hx, Social Hx, medications and allergies.   ROS:  Review of Systems  Gastrointestinal:  Positive for abdominal pain. Negative for diarrhea.  Genitourinary:  Negative for dysuria.   Other systems negative  Physical Exam  Patient Vitals for the past 24 hrs:  BP Temp Temp src Pulse Resp SpO2 Height Weight  08/12/23 2330 (!) 147/79 -- -- 71 -- 99 % -- --  08/12/23 2315 (!) 157/93 -- -- 77 -- 99 % -- --  08/12/23 2302 (!) 141/86 -- -- 81 -- 99 % -- --  08/12/23 2218 (!) 178/78 99.1 F (37.3 C) Oral 82 19 99 % 5' 6 (1.676 m) (!) 186.8 kg   Vitals:   08/12/23 2218 08/12/23 2302 08/12/23 2315 08/12/23 2330  BP: (!) 178/78 (!) 141/86 (!) 157/93 (!) 147/79   08/12/23 2345 08/13/23 0000 08/13/23 0015 08/13/23 0030  BP: (!) 141/83 (!) 129/58 (!) 137/58 (!) 142/80   08/13/23 0109 08/13/23 0115 08/13/23 0200  BP: 138/72 (!) 131/58 (!) 145/80    Constitutional: Well-developed, well-nourished female in no acute  distress.  Cardiovascular: normal rate and rhythm Respiratory: normal effort GI: Abd soft, moderately tenderin upper abdomen.  Difficult to do Murphy test.  Abd. gravid appropriate for gestational age.   No rebound or guarding. MS: Extremities nontender, no edema, normal ROM Neurologic: Alert and oriented x 4.  PELVIC EXAM: Dilation:  Closed Effacement (%): Thick Exam by:: Trudy, CNM    FHT:  Baseline 135 , moderate variability, accelerations present, no decelerations Contractions: Occasional    Labs: Results for orders placed or performed during the hospital encounter of 08/12/23 (from the past 48 hours)  Urinalysis, Routine w reflex microscopic -Urine, Clean Catch     Status: Abnormal   Collection Time: 08/12/23 10:27 PM  Result Value Ref Range   Color, Urine YELLOW YELLOW   APPearance HAZY (A) CLEAR   Specific Gravity, Urine 1.012 1.005 - 1.030   pH 6.0 5.0 - 8.0   Glucose, UA NEGATIVE NEGATIVE mg/dL   Hgb urine dipstick NEGATIVE NEGATIVE   Bilirubin Urine NEGATIVE NEGATIVE   Ketones, ur NEGATIVE NEGATIVE mg/dL   Protein, ur NEGATIVE NEGATIVE mg/dL   Nitrite NEGATIVE NEGATIVE   Leukocytes,Ua NEGATIVE NEGATIVE    Comment: Performed at [facility], [address], [place], [place] [address]  Protein / creatinine ratio, urine     Status: None   Collection Time: 08/12/23 10:27 PM  Result Value Ref Range   Creatinine, Urine 93 mg/dL   Total Protein, Urine 9 mg/dL    Comment: NO NORMAL RANGE ESTABLISHED FOR THIS TEST   Protein Creatinine Ratio 0.10 0.00 - 0.15 mg/mg[Cre]    Comment: Performed at [facility], [address], [place], [place] [address]  CBC     Status: Abnormal   Collection Time: 08/12/23 11:16 PM  Result Value Ref Range   WBC 6.1 4.0 - 10.5 K/uL   RBC 4.83 3.87 - 5.11 MIL/uL   Hemoglobin 11.7 (L) 12.0 - 15.0 g/dL   HCT 63.2 63.9 - 53.9 %   MCV 76.0 (L) 80.0 - 100.0 fL   MCH 24.2 (L) 26.0 - 34.0 pg   MCHC 31.9 30.0 - 36.0 g/dL   RDW 85.1  88.4 - 84.4 %   Platelets 250 150 - 400 K/uL   nRBC 0.0 0.0 - 0.2 %    Comment: Performed at [facility], [address] [place], [place] [address]  Comprehensive metabolic panel     Status: Abnormal   Collection Time: 08/12/23 11:16 PM  Result Value Ref Range   Sodium 137 135 - 145 mmol/L   Potassium 4.3 3.5 - 5.1 mmol/L   Chloride 105 98 - 111 mmol/L   CO2 23 22 - 32 mmol/L   Glucose, Bld 112 (H) 70 - 99 mg/dL    Comment: Glucose reference range applies only to samples taken after fasting for at least 8 hours.   BUN 10 6 - 20 mg/dL   Creatinine, Ser 9.17 0.44 - 1.00 mg/dL   Calcium 8.9 8.9 - 89.6 mg/dL   Total Protein 6.6 6.5 - 8.1 g/dL   Albumin 2.9 (L) 3.5 - 5.0 g/dL   AST 16 15 - 41 U/L   ALT 18 0 - 44 U/L   Alkaline Phosphatase 65 38 - 126 U/L   Total Bilirubin 0.5 0.0 - 1.2 mg/dL   GFR, Estimated >39 >39 mL/min    Comment: (NOTE) Calculated using the CKD-EPI Creatinine Equation (2021)    Anion gap 9 5 - 15    Comment: Performed at [facility], [address] [place], [place] [address]  Lipase, blood     Status: None   Collection Time: 08/12/23 11:16 PM  Result Value Ref Range   Lipase 30 11 - 51 U/L    Comment: Performed at [facility]  Lab, [address] [place], [place] [address]  Amylase     Status: None   Collection Time: 08/12/23 11:16 PM  Result Value Ref Range   Amylase 79 28 - 100 U/L    Comment: Performed at [facility], [address] [place], [place] [address]  Fetal fibronectin     Status: None   Collection Time: 08/12/23 11:55 PM  Result Value Ref Range   Fetal Fibronectin NEGATIVE NEGATIVE    Comment: Performed at [facility], [address], [place], [place] [address]    O/Positive/-- [phone])  Imaging:  US  ABDOMEN LIMITED RUQ (LIVER/GB) Result Date: 08/13/2023 CLINICAL DATA:  Right upper quadrant pain EXAM: ULTRASOUND ABDOMEN LIMITED RIGHT UPPER QUADRANT COMPARISON:  09/27/2021 CT FINDINGS: Gallbladder:  Gallbladder is well distended with multiple gallstones. No wall thickening or pericholecystic fluid is noted. Common bile duct: Diameter: 2.5 mm. Liver: Mild increase in echogenicity is noted within the liver consistent with fatty infiltration. No mass is noted. Portal vein is patent on color Doppler imaging with normal direction of blood flow towards the liver. Other: None. IMPRESSION: Fatty liver. Cholelithiasis without complicating factors. Electronically Signed   By: Oneil Devonshire M.D.   On: 08/13/2023 01:23     MAU Course/MDM: I have reviewed the triage vital signs and the nursing notes.   Pertinent labs & imaging results that were available during my care of the patient were reviewed by me and considered in my medical decision making (see chart for details).      I have reviewed her medical records including past results, notes and treatments.   I have ordered labs and reviewed results. Normal transaminases, lipase and amylase.  US  showed cholelithiasis and fatty liver   FFn negative and cervix closed NST reviewed Treatments in MAU included Labetalol  given.  Tramadol  for pain which helped.  Discussed cholelithiasis and low fat diet. .  Reviewed signs of Cholecystitis.  Discussed she may need surgery postpartum if she has cholecystitis episodes  Assessment: Single IUP at [redacted]w[redacted]d Abdominal pain Threatened preterm labor History of Gastric sleeve (states lost 100lb) Cholelithiasis  Plan: Discharge home Preterm Labor precautions and fetal kick counts Low fat diet Rx Tramadol  for pain Follow up in Office for prenatal visits and recheck Encouraged to return if she develops worsening of symptoms, increase in pain, fever, or other concerning symptoms.   Pt stable at time of discharge.  Earnie Pouch CNM, MSN Certified Nurse-Midwife 08/12/2023 11:37 PM

## 2023-08-13 ENCOUNTER — Inpatient Hospital Stay (HOSPITAL_COMMUNITY)

## 2023-08-13 DIAGNOSIS — O4703 False labor before 37 completed weeks of gestation, third trimester: Secondary | ICD-10-CM | POA: Diagnosis not present

## 2023-08-13 DIAGNOSIS — O26613 Liver and biliary tract disorders in pregnancy, third trimester: Secondary | ICD-10-CM | POA: Diagnosis not present

## 2023-08-13 DIAGNOSIS — Z3A31 31 weeks gestation of pregnancy: Secondary | ICD-10-CM

## 2023-08-13 DIAGNOSIS — K8020 Calculus of gallbladder without cholecystitis without obstruction: Secondary | ICD-10-CM | POA: Diagnosis not present

## 2023-08-13 DIAGNOSIS — R1010 Upper abdominal pain, unspecified: Secondary | ICD-10-CM | POA: Diagnosis not present

## 2023-08-13 LAB — COMPREHENSIVE METABOLIC PANEL WITH GFR
ALT: 18 U/L (ref 0–44)
AST: 16 U/L (ref 15–41)
Albumin: 2.9 g/dL — ABNORMAL LOW (ref 3.5–5.0)
Alkaline Phosphatase: 65 U/L (ref 38–126)
Anion gap: 9 (ref 5–15)
BUN: 10 mg/dL (ref 6–20)
CO2: 23 mmol/L (ref 22–32)
Calcium: 8.9 mg/dL (ref 8.9–10.3)
Chloride: 105 mmol/L (ref 98–111)
Creatinine, Ser: 0.82 mg/dL (ref 0.44–1.00)
GFR, Estimated: >60 mL/min (ref >60)
Glucose, Bld: 112 mg/dL — ABNORMAL HIGH (ref 70–99)
Potassium: 4.3 mmol/L (ref 3.5–5.1)
Sodium: 137 mmol/L (ref 135–145)
Total Bilirubin: 0.5 mg/dL (ref 0.0–1.2)
Total Protein: 6.6 g/dL (ref 6.5–8.1)

## 2023-08-13 LAB — LIPASE, BLOOD: Lipase: 30 U/L (ref 11–51)

## 2023-08-13 LAB — FETAL FIBRONECTIN: Fetal Fibronectin: NEGATIVE

## 2023-08-13 LAB — AMYLASE: Amylase: 79 U/L (ref 28–100)

## 2023-08-13 MED ORDER — TRAMADOL HCL 50 MG PO TABS: 50.0000 mg | ORAL_TABLET | Freq: Four times a day (QID) | ORAL | 0 refills | Status: DC | PRN

## 2023-08-13 NOTE — MAU Note (Signed)
 Difficulty tracing FHR, RN frequently at bedside adjusting monitor.

## 2023-08-14 ENCOUNTER — Ambulatory Visit: Admitting: Family Medicine

## 2023-08-14 ENCOUNTER — Other Ambulatory Visit: Payer: Self-pay

## 2023-08-14 VITALS — BP 133/92 | HR 87 | Wt >= 6400 oz

## 2023-08-14 DIAGNOSIS — O365930 Maternal care for other known or suspected poor fetal growth, third trimester, not applicable or unspecified: Secondary | ICD-10-CM

## 2023-08-14 DIAGNOSIS — O10913 Unspecified pre-existing hypertension complicating pregnancy, third trimester: Secondary | ICD-10-CM | POA: Diagnosis not present

## 2023-08-14 DIAGNOSIS — O0993 Supervision of high risk pregnancy, unspecified, third trimester: Secondary | ICD-10-CM

## 2023-08-14 DIAGNOSIS — R768 Other specified abnormal immunological findings in serum: Secondary | ICD-10-CM | POA: Diagnosis not present

## 2023-08-14 DIAGNOSIS — O24414 Gestational diabetes mellitus in pregnancy, insulin controlled: Secondary | ICD-10-CM

## 2023-08-14 DIAGNOSIS — O0990 Supervision of high risk pregnancy, unspecified, unspecified trimester: Secondary | ICD-10-CM

## 2023-08-14 DIAGNOSIS — O10919 Unspecified pre-existing hypertension complicating pregnancy, unspecified trimester: Secondary | ICD-10-CM

## 2023-08-14 DIAGNOSIS — Z3A31 31 weeks gestation of pregnancy: Secondary | ICD-10-CM

## 2023-08-14 DIAGNOSIS — F419 Anxiety disorder, unspecified: Secondary | ICD-10-CM

## 2023-08-14 DIAGNOSIS — Z23 Encounter for immunization: Secondary | ICD-10-CM | POA: Diagnosis not present

## 2023-08-14 DIAGNOSIS — O365920 Maternal care for other known or suspected poor fetal growth, second trimester, not applicable or unspecified: Secondary | ICD-10-CM

## 2023-08-14 DIAGNOSIS — Z9884 Bariatric surgery status: Secondary | ICD-10-CM

## 2023-08-14 MED ORDER — AMLODIPINE BESYLATE 10 MG PO TABS: 10.0000 mg | ORAL_TABLET | Freq: Every day | ORAL | 3 refills | Status: DC

## 2023-08-14 MED ORDER — LABETALOL HCL 300 MG PO TABS: 300.0000 mg | ORAL_TABLET | Freq: Two times a day (BID) | ORAL | 3 refills | Status: DC

## 2023-08-14 NOTE — Progress Notes (Signed)
 PRENATAL VISIT NOTE  Subjective:  Laura Benson is a 33 y.o. G1P0 at [redacted]w[redacted]d being seen today for ongoing prenatal care.  She is currently monitored for the following issues for this high-risk pregnancy and has Severe obesity (BMI >= 40) (HCC); Depression, recurrent (HCC); Chronic hypertension in pregnancy; Obstructive sleep apnea; Umbilical hernia without obstruction and without gangrene; Asthma; Uncontrolled type 2 diabetes mellitus with hyperglycemia (HCC); S/P laparoscopic sleeve gastrectomy; Anxiety; Lumbar radiculopathy; Complication of anesthesia; GAD (generalized anxiety disorder); MDD (major depressive disorder), recurrent, severe, with psychosis (HCC); Supervision of high risk pregnancy, antepartum; Morbid obesity (HCC); Anti M Antibody positive; Alpha thalassemia silent carrier; Poor fetal growth affecting management of mother in second trimester; and Gestational diabetes mellitus (GDM) requiring insulin  on their problem list.  Patient reports no bleeding, no contractions, no cramping, and no leaking.  Contractions: Not present. Vag. Bleeding: None.  Movement: Present. Denies leaking of fluid.   The following portions of the patient's history were reviewed and updated as appropriate: allergies, current medications, past family history, past medical history, past social history, past surgical history and problem list.   Objective:    Vitals:   08/14/23 0929  BP: (!) 133/92  Pulse: 87  Weight: (!) 411 lb 7 oz (186.6 kg)    Fetal Status:      Movement: Present    General: Alert, oriented and cooperative. Patient is in no acute distress.  Skin: Skin is warm and dry. No rash noted.   Cardiovascular: Normal heart rate noted  Respiratory: Normal respiratory effort, no problems with respiration noted  Abdomen: Soft, gravid, appropriate for gestational age.  Pain/Pressure: Present (has gallstones and pelvic pressure)     Pelvic: Cervical exam deferred        Extremities: Normal  range of motion.  Edema: Trace  Mental Status: Normal mood and affect. Normal behavior. Normal judgment and thought content.   Assessment and Plan:  Pregnancy: G1P0 at [redacted]w[redacted]d 1. Supervision of high risk pregnancy, antepartum (Primary) FHR BP appropriate today - Tdap vaccine greater than or equal to 7yo IM - amLODipine  (NORVASC ) 10 MG tablet; Take 1 tablet (10 mg total) by mouth daily.  Dispense: 90 tablet; Refill: 3  2. Gestational diabetes mellitus (GDM) requiring insulin  Blood glucose is not recently well-controlled.  Had not had Dexcom for several weeks and just started using it again last week.  Increased carb ratio to 1: 11.  Discussed increasing boluses but patient will start with these changes and if sugars remain elevated will increase boluses. Current settings-carb ratio 1:11, basal rate 0.5.  Could consider increasing basal rate to 0.6 if sugars remain elevated  3. Chronic hypertension in pregnancy Currently on amlodipine  10 mg daily and labetalol  200 mg twice daily.  Increasing labetalol  to 300 mg twice daily - amLODipine  (NORVASC ) 10 MG tablet; Take 1 tablet (10 mg total) by mouth daily.  Dispense: 90 tablet; Refill: 3  4. Anti M Antibody positive  5. S/P laparoscopic sleeve gastrectomy  6. Poor fetal growth affecting management of mother in second trimester, single or unspecified fetus Growth ultrasound scheduled for 7/21  7. Anxiety  8. [redacted] weeks gestation of pregnancy  Preterm labor symptoms and general obstetric precautions including but not limited to vaginal bleeding, contractions, leaking of fluid and fetal movement were reviewed in detail with the patient. Please refer to After Visit Summary for other counseling recommendations.   No follow-ups on file.  Future Appointments  Date Time Provider Department Center  08/17/2023 10:00 AM  [facility] PROVIDER 1 [facility]  08/17/2023 10:30 AM [facility] US2 [facility]  08/24/2023 10:00 AM [facility] PROVIDER 1 [facility]   08/24/2023 10:30 AM [facility] US4 [facility]  08/28/2023  8:55 AM Laura Donnice HERO, Benson [facility]  08/31/2023  9:00 AM [facility] PROVIDER 1 [facility]  08/31/2023  9:30 AM [facility] US3 [facility]  09/04/2023  2:20 PM Tobb, Kardie, DO [facility] None  09/07/2023  9:00 AM [facility] PROVIDER 1 [facility]  09/07/2023  9:30 AM [facility] US3 [facility]  09/11/2023  8:55 AM Laura Suzen Octave, Benson [facility]  09/18/2023  8:55 AM Laura Laura GAILS, Benson [facility]  09/25/2023  8:55 AM Laura Laura GAILS, Benson [facility]  10/02/2023  8:55 AM Laura Donnice HERO, Benson [facility]  10/09/2023  8:55 AM Laura, Laura GAILS, Benson [facility]    Laura Benson [name], Benson

## 2023-08-17 ENCOUNTER — Other Ambulatory Visit: Payer: Self-pay | Admitting: *Deleted

## 2023-08-17 ENCOUNTER — Ambulatory Visit

## 2023-08-17 ENCOUNTER — Ambulatory Visit: Attending: Maternal & Fetal Medicine | Admitting: Maternal & Fetal Medicine

## 2023-08-17 VITALS — BP 154/78 | HR 75

## 2023-08-17 VITALS — BP 137/84

## 2023-08-17 DIAGNOSIS — Z3A32 32 weeks gestation of pregnancy: Secondary | ICD-10-CM | POA: Diagnosis not present

## 2023-08-17 DIAGNOSIS — Z794 Long term (current) use of insulin: Secondary | ICD-10-CM | POA: Insufficient documentation

## 2023-08-17 DIAGNOSIS — O99013 Anemia complicating pregnancy, third trimester: Secondary | ICD-10-CM

## 2023-08-17 DIAGNOSIS — F419 Anxiety disorder, unspecified: Secondary | ICD-10-CM | POA: Diagnosis not present

## 2023-08-17 DIAGNOSIS — O24113 Pre-existing diabetes mellitus, type 2, in pregnancy, third trimester: Secondary | ICD-10-CM

## 2023-08-17 DIAGNOSIS — E669 Obesity, unspecified: Secondary | ICD-10-CM

## 2023-08-17 DIAGNOSIS — E1165 Type 2 diabetes mellitus with hyperglycemia: Secondary | ICD-10-CM | POA: Diagnosis present

## 2023-08-17 DIAGNOSIS — O99213 Obesity complicating pregnancy, third trimester: Secondary | ICD-10-CM

## 2023-08-17 DIAGNOSIS — O10919 Unspecified pre-existing hypertension complicating pregnancy, unspecified trimester: Secondary | ICD-10-CM | POA: Diagnosis present

## 2023-08-17 DIAGNOSIS — O99513 Diseases of the respiratory system complicating pregnancy, third trimester: Secondary | ICD-10-CM | POA: Diagnosis not present

## 2023-08-17 DIAGNOSIS — D563 Thalassemia minor: Secondary | ICD-10-CM

## 2023-08-17 DIAGNOSIS — O99343 Other mental disorders complicating pregnancy, third trimester: Secondary | ICD-10-CM | POA: Insufficient documentation

## 2023-08-17 DIAGNOSIS — O0990 Supervision of high risk pregnancy, unspecified, unspecified trimester: Secondary | ICD-10-CM

## 2023-08-17 DIAGNOSIS — Z148 Genetic carrier of other disease: Secondary | ICD-10-CM | POA: Insufficient documentation

## 2023-08-17 DIAGNOSIS — O10013 Pre-existing essential hypertension complicating pregnancy, third trimester: Secondary | ICD-10-CM

## 2023-08-17 DIAGNOSIS — O24414 Gestational diabetes mellitus in pregnancy, insulin controlled: Secondary | ICD-10-CM

## 2023-08-17 DIAGNOSIS — O365930 Maternal care for other known or suspected poor fetal growth, third trimester, not applicable or unspecified: Secondary | ICD-10-CM

## 2023-08-17 DIAGNOSIS — J45909 Unspecified asthma, uncomplicated: Secondary | ICD-10-CM | POA: Insufficient documentation

## 2023-08-17 DIAGNOSIS — O365920 Maternal care for other known or suspected poor fetal growth, second trimester, not applicable or unspecified: Secondary | ICD-10-CM

## 2023-08-17 NOTE — Progress Notes (Signed)
 Patient information  Patient Name: Laura Benson  Patient MRN:   [id number]  Referring practice: MFM Referring Provider: [facility])  Problem List   Patient Active Problem List   Diagnosis Date Noted   Gestational diabetes mellitus (GDM) requiring insulin  06/08/2023   Poor fetal growth affecting management of mother in second trimester 05/25/2023   Alpha thalassemia silent carrier 04/08/2023   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   Supervision of high risk pregnancy, antepartum 03/17/2023   Morbid obesity (HCC) 03/17/2023   MDD (major depressive disorder), recurrent, severe, with psychosis (HCC) 08/31/2021   GAD (generalized anxiety disorder) 10/23/2020   Complication of anesthesia 09/04/2020   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   Lumbar radiculopathy 08/17/2019   Uncontrolled type 2 diabetes mellitus with hyperglycemia (HCC) 01/18/2019   Anxiety 12/31/2018   Asthma 06/08/2018   Umbilical hernia without obstruction and without gangrene 02/25/2017   Obstructive sleep apnea 12/11/2016   Chronic hypertension in pregnancy 01/23/2016   Depression, recurrent (HCC) 01/05/2009   Severe obesity (BMI >= 40) (HCC) 03/26/2006    Maternal Fetal medicine Consult  Laura Benson is a 33 y.o. G1P0 at [redacted]w[redacted]d here for ultrasound and consultation. [facility] is doing well today with no acute concerns. Today we focused on the following:   GDM: F 93-110 and 2h are 161-225.  The patient reports she has had a difficult time with her diet.  We discussed the importance of dietary discretion.    Poor fetal growth: Her fetal growth is at the smaller end of normal which is concerning given the multitude of comorbidities.  We discussed that due to this we will recommend an earlier delivery around 36 weeks  CHTN: BP is well controlled today on labetalol .   Elevated BMI: complicates nearly every aspect of care including ability to visualize the fetal  anatomy.   The patient had time to ask questions that were answered to her satisfaction.  She verbalized understanding and agrees to proceed with the plan below.  Sonographic findings Single intrauterine pregnancy. Fetal cardiac activity: Observed. Presentation: Cephalic. Interval fetal anatomy appears normal. Amniotic fluid: Within normal limits.  MVP: 4.2 cm. Placenta: Anterior. BPP: 8/8.   There are limitations of prenatal ultrasound such as the inability to detect certain abnormalities due to poor visualization. Various factors such as fetal position, gestational age and maternal body habitus may increase the difficulty in visualizing the fetal anatomy.    Recommendations - Continue weekly antenatal testing with a growth ultrasound in 3 weeks. - If the estimated fetal weight continues to be less than the 15th percentile then delivery should occur around 36 weeks due to poor fetal growth in the setting of multiple comorbidities including uncontrolled diabetes.  Review of Systems: A review of systems was performed and was negative except per HPI   Vitals and Physical Exam    08/17/2023    9:59 AM 08/14/2023    9:29 AM 08/13/2023    2:00 AM  Vitals with BMI  Weight  411 lbs 7 oz   BMI  66.44   Systolic 154 133 854  Diastolic 78 92 80  Pulse 75 87 78    Sitting comfortably on the sonogram table Nonlabored breathing Normal rate and rhythm Abdomen is nontender  Past pregnancies OB History  Gravida Para Term Preterm AB Living  1       SAB IAB Ectopic Multiple Live Births          #  Outcome Date GA Lbr Len/2nd Weight Sex Type Anes PTL Lv  1 Current              I spent 20 minutes reviewing the patients chart, including labs and images as well as counseling the patient about her medical conditions. Greater than 50% of the time was spent in direct face-to-face patient counseling.  Delora Smaller  MFM, [facility]   08/17/2023  10:55 AM

## 2023-08-23 ENCOUNTER — Other Ambulatory Visit: Payer: Self-pay | Admitting: Family Medicine

## 2023-08-23 DIAGNOSIS — F419 Anxiety disorder, unspecified: Secondary | ICD-10-CM

## 2023-08-24 ENCOUNTER — Other Ambulatory Visit: Payer: Self-pay | Admitting: *Deleted

## 2023-08-24 ENCOUNTER — Ambulatory Visit: Attending: Maternal & Fetal Medicine | Admitting: *Deleted

## 2023-08-24 ENCOUNTER — Ambulatory Visit

## 2023-08-24 ENCOUNTER — Other Ambulatory Visit

## 2023-08-24 VITALS — BP 140/86

## 2023-08-24 DIAGNOSIS — F419 Anxiety disorder, unspecified: Secondary | ICD-10-CM | POA: Insufficient documentation

## 2023-08-24 DIAGNOSIS — O24119 Pre-existing diabetes mellitus, type 2, in pregnancy, unspecified trimester: Secondary | ICD-10-CM

## 2023-08-24 DIAGNOSIS — O24414 Gestational diabetes mellitus in pregnancy, insulin controlled: Secondary | ICD-10-CM

## 2023-08-24 DIAGNOSIS — O99343 Other mental disorders complicating pregnancy, third trimester: Secondary | ICD-10-CM

## 2023-08-24 DIAGNOSIS — O99843 Bariatric surgery status complicating pregnancy, third trimester: Secondary | ICD-10-CM | POA: Insufficient documentation

## 2023-08-24 DIAGNOSIS — Z148 Genetic carrier of other disease: Secondary | ICD-10-CM | POA: Diagnosis not present

## 2023-08-24 DIAGNOSIS — O10913 Unspecified pre-existing hypertension complicating pregnancy, third trimester: Secondary | ICD-10-CM | POA: Insufficient documentation

## 2023-08-24 DIAGNOSIS — Z3A33 33 weeks gestation of pregnancy: Secondary | ICD-10-CM

## 2023-08-24 DIAGNOSIS — O288 Other abnormal findings on antenatal screening of mother: Secondary | ICD-10-CM | POA: Diagnosis present

## 2023-08-24 DIAGNOSIS — O99513 Diseases of the respiratory system complicating pregnancy, third trimester: Secondary | ICD-10-CM | POA: Insufficient documentation

## 2023-08-24 DIAGNOSIS — O10013 Pre-existing essential hypertension complicating pregnancy, third trimester: Secondary | ICD-10-CM

## 2023-08-24 DIAGNOSIS — Z79899 Other long term (current) drug therapy: Secondary | ICD-10-CM | POA: Diagnosis not present

## 2023-08-24 DIAGNOSIS — O24319 Unspecified pre-existing diabetes mellitus in pregnancy, unspecified trimester: Secondary | ICD-10-CM

## 2023-08-24 DIAGNOSIS — O99213 Obesity complicating pregnancy, third trimester: Secondary | ICD-10-CM | POA: Insufficient documentation

## 2023-08-24 DIAGNOSIS — O10919 Unspecified pre-existing hypertension complicating pregnancy, unspecified trimester: Secondary | ICD-10-CM

## 2023-08-24 DIAGNOSIS — Z794 Long term (current) use of insulin: Secondary | ICD-10-CM | POA: Diagnosis not present

## 2023-08-24 DIAGNOSIS — J45909 Unspecified asthma, uncomplicated: Secondary | ICD-10-CM | POA: Diagnosis not present

## 2023-08-24 DIAGNOSIS — O24113 Pre-existing diabetes mellitus, type 2, in pregnancy, third trimester: Secondary | ICD-10-CM | POA: Insufficient documentation

## 2023-08-24 DIAGNOSIS — O99340 Other mental disorders complicating pregnancy, unspecified trimester: Secondary | ICD-10-CM | POA: Insufficient documentation

## 2023-08-24 DIAGNOSIS — O0990 Supervision of high risk pregnancy, unspecified, unspecified trimester: Secondary | ICD-10-CM

## 2023-08-24 DIAGNOSIS — E119 Type 2 diabetes mellitus without complications: Secondary | ICD-10-CM

## 2023-08-24 DIAGNOSIS — E669 Obesity, unspecified: Secondary | ICD-10-CM

## 2023-08-24 NOTE — Procedures (Signed)
 Laura Benson December 09, 1990 [redacted]w[redacted]d  Fetus A Non-Stress Test Interpretation for 08/24/23  Indication: Gestational Diabetes medication controlled and Morbid obesity, CHTN, IUGR, NR NST  Fetal Heart Rate A Mode: External Baseline Rate (A): 135 bpm Variability: Moderate Accelerations: 10 x 10 Decelerations: None Multiple birth?: No  Uterine Activity Mode: Palpation, Toco Contraction Frequency (min): Occas Contraction Quality: Mild Resting Tone Palpated: Relaxed Resting Time: Adequate  Interpretation (Fetal Testing) Nonstress Test Interpretation: Non-reactive Comments: Dr. Ileana reviewed tracing. Though reassuring, fetus did not meet criteria. BPP added.

## 2023-08-24 NOTE — Progress Notes (Signed)
 MFM Consult Note  Laura Benson is currently at 33 weeks and 2 days.  She has been followed due to maternal obesity with a BMI of 62.4, pregestational diabetes treated with insulin , chronic hypertension treated with amlodipine  and labetalol , and history of gastric bypass surgery.    Her glycemic control is now improved after she received a Dexcom continuous glucose monitor and an insulin  pump.  Her blood pressure today was 140/86.  She denies any problems since her last exam.  She initially had a nonreactive NST.  However, a BPP performed following her nonreactive NST was 8 out of 8, making her total biophysical profile score 8 out of 10 today.    There was normal amniotic fluid noted with a total AFI of 11.92 cm.  The fetus was in the vertex presentation.    Due to maternal obesity and her underlying medical conditions, delivery is recommended at around 37 weeks.    She will return in 1 week for an NST.    Fetal kick count instructions were reviewed today.  The patient stated that all of her questions were answered today.  A total of 20 minutes was spent counseling and coordinating the care for this patient.  Greater than 50% of the time was spent in direct face-to-face contact.

## 2023-08-24 NOTE — Addendum Note (Signed)
 Addended by: ILEANA BABARA RUSHIE STEFFAN on: 08/24/2023 12:46 PM   Modules accepted: Level of Service

## 2023-08-25 ENCOUNTER — Telehealth: Payer: Self-pay | Admitting: Lactation Services

## 2023-08-25 NOTE — Telephone Encounter (Signed)
 Attempted to place PA for Dexcom G7 receiver at request of Pharmacy.   Called and spoke with Jacob at [employer]. He reports this needs to be handled through [facility] at [phone].   Called and spoke with patient. She is using the receiver and her smart phone. She obtained  the receiver in May and reports it is working well and does not need another one at this time.

## 2023-08-28 ENCOUNTER — Ambulatory Visit (INDEPENDENT_AMBULATORY_CARE_PROVIDER_SITE_OTHER): Admitting: Family Medicine

## 2023-08-28 ENCOUNTER — Other Ambulatory Visit: Payer: Self-pay

## 2023-08-28 ENCOUNTER — Encounter: Payer: Self-pay | Admitting: Family Medicine

## 2023-08-28 VITALS — BP 147/92 | HR 87 | Wt >= 6400 oz

## 2023-08-28 DIAGNOSIS — O24414 Gestational diabetes mellitus in pregnancy, insulin controlled: Secondary | ICD-10-CM

## 2023-08-28 DIAGNOSIS — O10913 Unspecified pre-existing hypertension complicating pregnancy, third trimester: Secondary | ICD-10-CM | POA: Diagnosis not present

## 2023-08-28 DIAGNOSIS — Z9884 Bariatric surgery status: Secondary | ICD-10-CM

## 2023-08-28 DIAGNOSIS — R768 Other specified abnormal immunological findings in serum: Secondary | ICD-10-CM

## 2023-08-28 DIAGNOSIS — O0990 Supervision of high risk pregnancy, unspecified, unspecified trimester: Secondary | ICD-10-CM

## 2023-08-28 DIAGNOSIS — O0993 Supervision of high risk pregnancy, unspecified, third trimester: Secondary | ICD-10-CM | POA: Diagnosis not present

## 2023-08-28 DIAGNOSIS — Z3A33 33 weeks gestation of pregnancy: Secondary | ICD-10-CM

## 2023-08-28 DIAGNOSIS — O10919 Unspecified pre-existing hypertension complicating pregnancy, unspecified trimester: Secondary | ICD-10-CM

## 2023-08-28 MED ORDER — LABETALOL HCL 300 MG PO TABS: 300.0000 mg | ORAL_TABLET | Freq: Three times a day (TID) | ORAL | 3 refills | Status: DC

## 2023-08-28 MED ORDER — PRENATAL 28-0.8 MG PO TABS: 1.0000 | ORAL_TABLET | Freq: Every day | ORAL | 12 refills | Status: DC

## 2023-08-28 NOTE — Patient Instructions (Signed)

## 2023-08-28 NOTE — Progress Notes (Signed)
 IOL scheduled 09/19/23 daytime

## 2023-08-28 NOTE — Progress Notes (Signed)
 Subjective:  Laura Benson is a 33 y.o. G1P0 at [redacted]w[redacted]d being seen today for ongoing prenatal care.  She is currently monitored for the following issues for this high-risk pregnancy and has Severe obesity (BMI >= 40) (HCC); Depression, recurrent (HCC); Chronic hypertension in pregnancy; Obstructive sleep apnea; Umbilical hernia without obstruction and without gangrene; Asthma; Uncontrolled type 2 diabetes mellitus with hyperglycemia (HCC); S/P laparoscopic sleeve gastrectomy; Lumbar radiculopathy; Complication of anesthesia; GAD (generalized anxiety disorder); MDD (major depressive disorder), recurrent, severe, with psychosis (HCC); Supervision of high risk pregnancy, antepartum; Morbid obesity (HCC); Anti M Antibody positive; Alpha thalassemia silent carrier; Poor fetal growth affecting management of mother in second trimester; and Gestational diabetes mellitus (GDM) requiring insulin  on their problem list.  Patient reports no complaints.  Contractions: Irritability. Vag. Bleeding: None.  Movement: Present. Denies leaking of fluid.   The following portions of the patient's history were reviewed and updated as appropriate: allergies, current medications, past family history, past medical history, past social history, past surgical history and problem list. Problem list updated.  Objective:   Vitals:   08/28/23 0916  BP: (!) 147/92  Pulse: 87  Weight: (!) 412 lb 4.8 oz (187 kg)    Fetal Status:     Movement: Present     General:  Alert, oriented and cooperative. Patient is in no acute distress.  Skin: Skin is warm and dry. No rash noted.   Cardiovascular: Normal heart rate noted  Respiratory: Normal respiratory effort, no problems with respiration noted  Abdomen: Soft, gravid, appropriate for gestational age. Pain/Pressure: Present     Pelvic: Vag. Bleeding: None     Cervical exam deferred        Extremities: Normal range of motion.  Edema: Trace  Mental Status: Normal mood and affect.  Normal behavior. Normal judgment and thought content.   Urinalysis:      Assessment and Plan:  Pregnancy: G1P0 at [redacted]w[redacted]d  1. Supervision of high risk pregnancy, antepartum (Primary) BP elevated, see below FHR normal (auscultated with great difficulty with US , unable to calculate exact rate) Swabs next visit  2. Gestational diabetes mellitus (GDM) requiring insulin  Data reviewed, mostly at goal but still having large swings with meals, fastings are good Currently on insulin  pump, settings are: Basal 0.5 IC ratio 11 (increased at last visit) Correction factor to start at 110  Regimen adjusted to add TID bolus of 5u in addition to whatever she gets for IC calculation up to max bolus of 10u (for instance if IC calc gives bolus recommendation of 3u, add 5u and give 8u total. If IC calc is 7u, add 5u=12 but can't bolus more than 10u so just give 10u)  Last growth US  08/17/2023, EFW 13%, 1726g, AC 44%, AFI wnl Weekly testing reassuring to date IOL recommended at 37 weeks, patient is amenable Scheduled for 09/19/2023 Form faxed, orders placed  3. Chronic hypertension in pregnancy Currently on amlodipine  10 mg and labetalol  300 mg BID, mild range BP's today Asymptomatic Increase labetalol  from BID to TID  4. Anti M Antibody positive Not clinically significant  5. S/P laparoscopic sleeve gastrectomy Micronutrient panel unremarkable with exception of borderline vitamin D  level Taking prenatal vitamins  6. Severe obesity (BMI >= 40) (HCC)   Preterm labor symptoms and general obstetric precautions including but not limited to vaginal bleeding, contractions, leaking of fluid and fetal movement were reviewed in detail with the patient. Please refer to After Visit Summary for other counseling recommendations.  No follow-ups on file.  [name], MD

## 2023-08-31 ENCOUNTER — Other Ambulatory Visit

## 2023-08-31 ENCOUNTER — Ambulatory Visit: Attending: Maternal & Fetal Medicine | Admitting: *Deleted

## 2023-08-31 ENCOUNTER — Other Ambulatory Visit: Payer: Self-pay | Admitting: *Deleted

## 2023-08-31 ENCOUNTER — Telehealth: Payer: Self-pay | Admitting: Lactation Services

## 2023-08-31 ENCOUNTER — Ambulatory Visit (HOSPITAL_BASED_OUTPATIENT_CLINIC_OR_DEPARTMENT_OTHER)

## 2023-08-31 VITALS — BP 136/81

## 2023-08-31 DIAGNOSIS — O24113 Pre-existing diabetes mellitus, type 2, in pregnancy, third trimester: Secondary | ICD-10-CM

## 2023-08-31 DIAGNOSIS — O288 Other abnormal findings on antenatal screening of mother: Secondary | ICD-10-CM | POA: Diagnosis not present

## 2023-08-31 DIAGNOSIS — Z3A34 34 weeks gestation of pregnancy: Secondary | ICD-10-CM | POA: Insufficient documentation

## 2023-08-31 DIAGNOSIS — O24119 Pre-existing diabetes mellitus, type 2, in pregnancy, unspecified trimester: Secondary | ICD-10-CM

## 2023-08-31 DIAGNOSIS — E119 Type 2 diabetes mellitus without complications: Secondary | ICD-10-CM

## 2023-08-31 DIAGNOSIS — O10013 Pre-existing essential hypertension complicating pregnancy, third trimester: Secondary | ICD-10-CM | POA: Diagnosis not present

## 2023-08-31 DIAGNOSIS — O321XX0 Maternal care for breech presentation, not applicable or unspecified: Secondary | ICD-10-CM | POA: Diagnosis not present

## 2023-08-31 DIAGNOSIS — O10913 Unspecified pre-existing hypertension complicating pregnancy, third trimester: Secondary | ICD-10-CM

## 2023-08-31 DIAGNOSIS — O99343 Other mental disorders complicating pregnancy, third trimester: Secondary | ICD-10-CM

## 2023-08-31 DIAGNOSIS — F419 Anxiety disorder, unspecified: Secondary | ICD-10-CM

## 2023-08-31 DIAGNOSIS — O289 Unspecified abnormal findings on antenatal screening of mother: Secondary | ICD-10-CM | POA: Diagnosis not present

## 2023-08-31 NOTE — Procedures (Signed)
 Laura Benson [birth year] [redacted]w[redacted]d  Fetus A Non-Stress Test Interpretation for 08/31/23 (NST only with BPP added due to NR NST)  Indication: IUGR, Chronic Hypertenstion, Diabetes, and Morbid obesity  Fetal Heart Rate A Mode: External Baseline Rate (A): 135 bpm Variability: Moderate Accelerations: None Decelerations: None Multiple birth?: No  Uterine Activity Mode: Palpation, Toco Contraction Frequency (min): UI Contraction Quality: Mild Resting Tone Palpated: Relaxed Resting Time: Adequate  Interpretation (Fetal Testing) Nonstress Test Interpretation: Non-reactive Comments: Dr. William reviewed tracing. Fetus was moving, but heart rate not traced on 3 occasions. Attempted to hold and guide cardio, but unable to capture accelerations.

## 2023-08-31 NOTE — Telephone Encounter (Signed)
 Received Fax from [facility] to Clarify Labetalol  prescription. Per Dr. [name] and chart notes, dosage should be 300 mg TID. Pharmacist reports dosage was cleared up on Friday. No other concerns noted.

## 2023-09-02 ENCOUNTER — Ambulatory Visit: Attending: Obstetrics and Gynecology

## 2023-09-02 ENCOUNTER — Other Ambulatory Visit: Payer: Self-pay | Admitting: *Deleted

## 2023-09-02 ENCOUNTER — Other Ambulatory Visit: Payer: Self-pay | Admitting: Obstetrics

## 2023-09-02 ENCOUNTER — Ambulatory Visit: Attending: Obstetrics and Gynecology | Admitting: Obstetrics

## 2023-09-02 DIAGNOSIS — E669 Obesity, unspecified: Secondary | ICD-10-CM

## 2023-09-02 DIAGNOSIS — O10013 Pre-existing essential hypertension complicating pregnancy, third trimester: Secondary | ICD-10-CM

## 2023-09-02 DIAGNOSIS — Z3A34 34 weeks gestation of pregnancy: Secondary | ICD-10-CM

## 2023-09-02 DIAGNOSIS — O10919 Unspecified pre-existing hypertension complicating pregnancy, unspecified trimester: Secondary | ICD-10-CM

## 2023-09-02 DIAGNOSIS — O10913 Unspecified pre-existing hypertension complicating pregnancy, third trimester: Secondary | ICD-10-CM

## 2023-09-02 DIAGNOSIS — O99213 Obesity complicating pregnancy, third trimester: Secondary | ICD-10-CM

## 2023-09-02 DIAGNOSIS — O24414 Gestational diabetes mellitus in pregnancy, insulin controlled: Secondary | ICD-10-CM

## 2023-09-02 DIAGNOSIS — O24119 Pre-existing diabetes mellitus, type 2, in pregnancy, unspecified trimester: Secondary | ICD-10-CM

## 2023-09-02 DIAGNOSIS — O365930 Maternal care for other known or suspected poor fetal growth, third trimester, not applicable or unspecified: Secondary | ICD-10-CM

## 2023-09-02 DIAGNOSIS — E119 Type 2 diabetes mellitus without complications: Secondary | ICD-10-CM

## 2023-09-02 DIAGNOSIS — O24419 Gestational diabetes mellitus in pregnancy, unspecified control: Secondary | ICD-10-CM

## 2023-09-02 NOTE — Procedures (Signed)
 Laura Benson 09-20-1990 [redacted]w[redacted]d  Fetus A Non-Stress Test Interpretation for 08/06/25Nst with BPP  Indication: Chronic Hypertenstion, Diabetes, and M.O.  Fetal Heart Rate A Mode: External Baseline Rate (A): 140 bpm Variability: Moderate Accelerations: 10 x 10 Decelerations: None Multiple birth?: No  Uterine Activity Mode: Toco Contraction Frequency (min): none Resting Tone Palpated: Relaxed  Interpretation (Fetal Testing) Nonstress Test Interpretation: Non-reactive Comments: tracing reviewed by Dr. Ileana

## 2023-09-02 NOTE — Progress Notes (Signed)
 MFM Consult Note  Laura Benson is currently at 34 weeks and 4 days.  She has been followed due to maternal obesity with a BMI of 62.4, pregestational diabetes treated with insulin , chronic hypertension treated with amlodipine  and labetalol , and history of gastric bypass surgery.    She denies any problems since her last exam and reports feeling fetal movements throughout the day.  She initially had a nonreactive NST.  However, a BPP performed following her nonreactive NST was 8 out of 8, making her total biophysical profile score 8 out of 10 today.    There was normal amniotic fluid noted with a total AFI of 14.39 cm.  The fetus was in the breech presentation.    Due to maternal obesity and her underlying medical conditions, she already has an induction of labor scheduled on September 19, 2023.  She will return in 1 week for a BPP and growth scan.  Fetal kick count instructions were reviewed today.  Should the fetal position be difficult to determine during her future exams, we will perform a transvaginal ultrasound to determine the fetal presentation.  The patient stated that all of her questions were answered today.  A total of 20 minutes was spent counseling and coordinating the care for this patient.  Greater than 50% of the time was spent in direct face-to-face contact.

## 2023-09-02 NOTE — Addendum Note (Signed)
 Addended by: Storey Stangeland MING VICTOR on: 09/02/2023 05:18 PM   Modules accepted: Level of Service

## 2023-09-04 ENCOUNTER — Encounter: Payer: Self-pay | Admitting: Cardiology

## 2023-09-04 ENCOUNTER — Ambulatory Visit (INDEPENDENT_AMBULATORY_CARE_PROVIDER_SITE_OTHER): Admitting: Cardiology

## 2023-09-04 ENCOUNTER — Telehealth: Payer: Self-pay

## 2023-09-04 VITALS — BP 153/83 | HR 89 | Ht 66.0 in | Wt >= 6400 oz

## 2023-09-04 DIAGNOSIS — Z3A34 34 weeks gestation of pregnancy: Secondary | ICD-10-CM

## 2023-09-04 DIAGNOSIS — O10919 Unspecified pre-existing hypertension complicating pregnancy, unspecified trimester: Secondary | ICD-10-CM | POA: Diagnosis not present

## 2023-09-04 DIAGNOSIS — Z7689 Persons encountering health services in other specified circumstances: Secondary | ICD-10-CM | POA: Diagnosis not present

## 2023-09-04 MED ORDER — HYDRALAZINE HCL 25 MG PO TABS: 25.0000 mg | ORAL_TABLET | Freq: Three times a day (TID) | ORAL | 3 refills | Status: DC

## 2023-09-04 NOTE — Progress Notes (Signed)
 Cardio-Obstetrics Clinic  New Evaluation  Date:  09/04/2023   ID:  Laura Benson, DOB 09/19/1990, MRN [id number]  PCP:  [facility]   [facility] Providers Cardiologist:  Dub Huntsman, DO  Electrophysiologist:  None       Referring MD: [name],*   Chief Complaint:  my blood pressure is elevated  History of Present Illness:    Laura Benson is a 33 y.o. female [G1P0] who is being seen today for the evaluation of hypertension in pregnancy at the request of Richard, Holz,*.   Medical hx includes hypertension, anxiety, PCOS, Sleep apnea on cpap, morbid obesity.  She is [redacted] weeks pregnant and experiencing persistent hypertension. She is on amlodipine  and labetalol . Her blood pressure still is not controlled.   She is excited about the baby  who is the boy.  Prior CV Studies Reviewed: The following studies were reviewed today: None today   Past Medical History:  Diagnosis Date   Anxiety    Asthma    Complication of anesthesia    Take for a while to wake up   Depression    Diabetes mellitus without complication (HCC)    Hypertension    Morbid obesity (HCC)    PCOS (polycystic ovarian syndrome)    Sleep apnea    CPAP   Tachycardia     Past Surgical History:  Procedure Laterality Date   ADENOIDECTOMY     LAPAROSCOPIC GASTRIC SLEEVE RESECTION N/A 04/16/2020   Procedure: LAPAROSCOPIC GASTRIC SLEEVE RESECTION;  Surgeon: Tanda Locus, MD;  Location: [facility];  Service: General;  Laterality: N/A;   TONSILLECTOMY     UPPER GI ENDOSCOPY N/A 04/16/2020   Procedure: UPPER GI ENDOSCOPY;  Surgeon: Tanda Locus, MD;  Location: [facility];  Service: General;  Laterality: N/A;      OB History     Gravida  1   Para      Term      Preterm      AB      Living         SAB      IAB      Ectopic      Multiple      Live Births                  Current Medications: Current Meds  Medication Sig    acetaminophen -caffeine (EXCEDRIN  TENSION HEADACHE) 500-65 MG TABS per tablet Take 2 tablets by mouth every 8 (eight) hours as needed.   albuterol  (VENTOLIN  HFA) 108 (90 Base) MCG/ACT inhaler Inhale 2 puffs into the lungs every 6 (six) hours as needed for wheezing or shortness of breath.   amLODipine  (NORVASC ) 10 MG tablet Take 1 tablet (10 mg total) by mouth daily.   aspirin  EC 81 MG tablet Take 1 tablet (81 mg total) by mouth 2 (two) times daily. Swallow whole.   Continuous Glucose Receiver (DEXCOM G7 RECEIVER) DEVI 1 Units by Does not apply route daily.   Continuous Glucose Sensor (DEXCOM G7 SENSOR) MISC Apply one sensor every 10 days ICD10: O24.414 (Gestational diabetes, insulin  dependent)   fluticasone  (FLONASE ) 50 MCG/ACT nasal spray Place 2 sprays into both nostrils daily.   hydrALAZINE  (APRESOLINE ) 25 MG tablet Take 1 tablet (25 mg total) by mouth 3 (three) times daily.   Insulin  Disposable Pump (OMNIPOD 5 DEXG7G6 INTRO GEN 5) KIT 1 Device by Does not apply route every 3 (three) days.   Insulin  Disposable Pump (OMNIPOD 5 DEXG7G6 PODS  GEN 5) MISC 1 Device by Does not apply route every 3 (three) days.   insulin  lispro (HUMALOG ) 100 UNIT/ML injection For use with insulin  pump. Basal rate: 0.5 u/hr. Bolus: 5 units TID with meals.   labetalol  (NORMODYNE ) 300 MG tablet Take 1 tablet (300 mg total) by mouth 3 (three) times daily. Take 1 tablet (200 mg total) by mouth 2 (two) times daily.   Prenatal 28-0.8 MG TABS Take 1 tablet by mouth daily.   promethazine  (PHENERGAN ) 25 MG tablet Take 1 tablet (25 mg total) by mouth every 6 (six) hours as needed for nausea or vomiting.   sertraline  (ZOLOFT ) 50 MG tablet Take 1 tablet (50 mg total) by mouth daily.   traMADol  (ULTRAM ) 50 MG tablet Take 1 tablet (50 mg total) by mouth every 6 (six) hours as needed for moderate pain (pain score 4-6).     Allergies:   Patient has no known allergies.   Social History   Socioeconomic History   Marital status:  Single    Spouse name: Not on file   Number of children: 0   Years of education: Not on file   Highest education level: 12th grade  Occupational History    Employer: [employer]  Tobacco Use   Smoking status: Former    Types: Cigarettes   Smokeless tobacco: Never  Vaping Use   Vaping status: Never Used  Substance and Sexual Activity   Alcohol use: No   Drug use: Never   Sexual activity: Yes    Birth control/protection: None  Other Topics Concern   Not on file  Social History Narrative   Not on file   Social Drivers of Health   Financial Resource Strain: Low Risk  (04/21/2023)   Overall Financial Resource Strain (CARDIA)    Difficulty of Paying Living Expenses: Not hard at all  Food Insecurity: No Food Insecurity (04/27/2023)   Hunger Vital Sign    Worried About Running Out of Food in the Last Year: Never true    Ran Out of Food in the Last Year: Never true  Recent Concern: Food Insecurity - Food Insecurity Present (04/21/2023)   Hunger Vital Sign    Worried About Running Out of Food in the Last Year: Never true    Ran Out of Food in the Last Year: Sometimes true  Transportation Needs: No Transportation Needs (04/27/2023)   PRAPARE - Administrator, Civil Service (Medical): No    Lack of Transportation (Non-Medical): No  Physical Activity: Sufficiently Active (04/21/2023)   Exercise Vital Sign    Days of Exercise per Week: 4 days    Minutes of Exercise per Session: 150+ min  Stress: No Stress Concern Present (04/21/2023)   [employer] of Occupational Health - Occupational Stress Questionnaire    Feeling of Stress : Only a little  Social Connections: Moderately Isolated (04/21/2023)   Social Connection and Isolation Panel    Frequency of Communication with Friends and Family: More than three times a week    Frequency of Social Gatherings with Friends and Family: More than three times a week    Attends Religious Services: 1 to 4 times per year    Active Member of  [employer] or Organizations: No    Attends Engineer, structural: Not on file    Marital Status: Never married      Family History  Problem Relation Age of Onset   Stroke Mother    Hypertension Mother    Diabetes Mother  Hypertension Father    Diabetes Father    Colon cancer Father    Diabetes Sister    Hypertension Maternal Grandmother    Hypertension Paternal Grandmother    Diabetes Paternal Grandmother    Breast cancer Cousin       ROS:   Please see the history of present illness.     All other systems reviewed and are negative.   Labs/EKG Reviewed:    EKG:   EKG was  ordered today.  The ekg ordered today demonstrates sinus rhythm   Recent Labs: 08/12/2023: ALT 18; BUN 10; Creatinine, Ser 0.82; Hemoglobin 11.7; Platelets 250; Potassium 4.3; Sodium 137   Recent Lipid Panel Lab Results  Component Value Date/Time   CHOL 178 08/30/2021 10:18 PM   TRIG 57 08/30/2021 10:18 PM   HDL 48 08/30/2021 10:18 PM   CHOLHDL 3.7 08/30/2021 10:18 PM   LDLCALC 119 (H) 08/30/2021 10:18 PM   LDLCALC 134 (H) 11/01/2019 10:28 AM    Physical Exam:    VS:  BP (!) 153/83 (BP Location: Left Arm, Patient Position: Sitting, Cuff Size: Normal)   Pulse 89   Ht 5' 6 (1.676 m)   Wt (!) 414 lb 8 oz (188 kg)   LMP 01/03/2023   SpO2 96%   BMI 66.90 kg/m     Wt Readings from Last 3 Encounters:  09/04/23 (!) 414 lb 8 oz (188 kg)  08/28/23 (!) 412 lb 4.8 oz (187 kg)  08/14/23 (!) 411 lb 7 oz (186.6 kg)     GEN:  Well nourished, well developed in no acute distress HEENT: Normal NECK: No JVD; No carotid bruits LYMPHATICS: No lymphadenopathy CARDIAC: RRR, no murmurs, rubs, gallops RESPIRATORY:  Clear to auscultation without rales, wheezing or rhonchi  ABDOMEN: Soft, non-tender, non-distended MUSCULOSKELETAL:  No edema; No deformity  SKIN: Warm and dry NEUROLOGIC:  Alert and oriented x 3 PSYCHIATRIC:  Normal affect    Risk Assessment/Risk Calculators:     CARPREG II Risk  Prediction Index Score:  1.  The patient's risk for a primary cardiac event is 5%.   Modified World Health Organization [facility]) Classification of Maternal CV Risk   Class I         ASSESSMENT & PLAN:    Gestational hypertension, third trimester Persistent hypertension at 32 weeks despite amlodipine  and labetalol . Increased labetalol  dosage recently.  - Add hydralazine  three times daily with labetalol . - Continue amlodipine  and labetalol  as prescribed. - Coordinate with pharmacist  for follow-up next week for medication adjustment. - Enroll in postpartum support program for blood pressure monitoring and support, including depression   - Pt meets eligibility for IMPACT study, discussed risks/benefits of the study  - Provided with information sheet  - Aware that the research team will be in contact within 24-48hrs, followed by the Lead CHW     Patient Instructions  Medication Instructions:  Your physician has recommended you make the following change in your medication:  START: Hydrazine 25 mg twice daily  *If you need a refill on your cardiac medications before your next appointment, please call your pharmacy*   Follow-Up: At [facility], you and your health needs are our priority.  As part of our continuing mission to provide you with exceptional heart care, our providers are all part of one team.  This team includes your primary Cardiologist (physician) and Advanced Practice Providers or APPs (Physician Assistants and Nurse Practitioners) who all work together to provide you with the care you  need, when you need it.  Your next appointment:    Aug 20th at 9:20a  Provider:   Demonica Farrey, DO    Other Instructions Please see our pharmacist [place] P next week. (Thursday or Friday)        Dispo:  No follow-ups on file.   Medication Adjustments/Labs and Tests Ordered: Current medicines are reviewed at length with the patient today.  Concerns regarding medicines are  outlined above.  Tests Ordered: Orders Placed This Encounter  Procedures   AMB Referral to [facility] Pharm-D   EKG 12-Lead   Medication Changes: Meds ordered this encounter  Medications   hydrALAZINE  (APRESOLINE ) 25 MG tablet    Sig: Take 1 tablet (25 mg total) by mouth 3 (three) times daily.    Dispense:  270 tablet    Refill:  3

## 2023-09-04 NOTE — Patient Instructions (Addendum)
 Medication Instructions:  Your physician has recommended you make the following change in your medication:  START: Hydrazine 25 mg twice daily  *If you need a refill on your cardiac medications before your next appointment, please call your pharmacy*   Follow-Up: At [facility], you and your health needs are our priority.  As part of our continuing mission to provide you with exceptional heart care, our providers are all part of one team.  This team includes your primary Cardiologist (physician) and Advanced Practice Providers or APPs (Physician Assistants and Nurse Practitioners) who all work together to provide you with the care you need, when you need it.  Your next appointment:    Aug 20th at 9:20a  Provider:   Kardie Tobb, DO    Other Instructions Please see our pharmacist [place] P next week. (Thursday or Friday)

## 2023-09-04 NOTE — Telephone Encounter (Signed)
 Dr Sheena requested an OB PharmD with this patient for 09-10-23 or 09-11-23 w C Pavero.   09-04-23 VB

## 2023-09-07 ENCOUNTER — Ambulatory Visit

## 2023-09-07 ENCOUNTER — Telehealth: Payer: Self-pay

## 2023-09-07 ENCOUNTER — Ambulatory Visit: Attending: Obstetrics

## 2023-09-07 ENCOUNTER — Ambulatory Visit (HOSPITAL_BASED_OUTPATIENT_CLINIC_OR_DEPARTMENT_OTHER): Admitting: Maternal & Fetal Medicine

## 2023-09-07 ENCOUNTER — Other Ambulatory Visit: Payer: Self-pay | Admitting: Obstetrics

## 2023-09-07 VITALS — BP 146/84 | HR 86

## 2023-09-07 VITALS — BP 159/83 | HR 84

## 2023-09-07 DIAGNOSIS — O24119 Pre-existing diabetes mellitus, type 2, in pregnancy, unspecified trimester: Secondary | ICD-10-CM | POA: Diagnosis present

## 2023-09-07 DIAGNOSIS — O365920 Maternal care for other known or suspected poor fetal growth, second trimester, not applicable or unspecified: Secondary | ICD-10-CM

## 2023-09-07 DIAGNOSIS — Z3A35 35 weeks gestation of pregnancy: Secondary | ICD-10-CM

## 2023-09-07 DIAGNOSIS — E119 Type 2 diabetes mellitus without complications: Secondary | ICD-10-CM

## 2023-09-07 DIAGNOSIS — O99213 Obesity complicating pregnancy, third trimester: Secondary | ICD-10-CM

## 2023-09-07 DIAGNOSIS — O24113 Pre-existing diabetes mellitus, type 2, in pregnancy, third trimester: Secondary | ICD-10-CM | POA: Diagnosis not present

## 2023-09-07 DIAGNOSIS — O10013 Pre-existing essential hypertension complicating pregnancy, third trimester: Secondary | ICD-10-CM

## 2023-09-07 DIAGNOSIS — O2412 Pre-existing diabetes mellitus, type 2, in childbirth: Secondary | ICD-10-CM | POA: Diagnosis not present

## 2023-09-07 DIAGNOSIS — O0990 Supervision of high risk pregnancy, unspecified, unspecified trimester: Secondary | ICD-10-CM

## 2023-09-07 DIAGNOSIS — E669 Obesity, unspecified: Secondary | ICD-10-CM | POA: Diagnosis not present

## 2023-09-07 DIAGNOSIS — O24414 Gestational diabetes mellitus in pregnancy, insulin controlled: Secondary | ICD-10-CM | POA: Insufficient documentation

## 2023-09-07 DIAGNOSIS — O10919 Unspecified pre-existing hypertension complicating pregnancy, unspecified trimester: Secondary | ICD-10-CM | POA: Diagnosis present

## 2023-09-07 DIAGNOSIS — Z006 Encounter for examination for normal comparison and control in clinical research program: Secondary | ICD-10-CM

## 2023-09-07 NOTE — Telephone Encounter (Signed)
 Called pt regarding Impact Mom Trial. Pt did not answer. A message was left with contact information.

## 2023-09-07 NOTE — Progress Notes (Signed)
 Patient information  Patient Name: Laura Benson  Patient MRN:   [id number]  Referring practice: MFM Referring Provider: [facility])  Problem List   Patient Active Problem List   Diagnosis Date Noted   Gestational diabetes mellitus (GDM) requiring insulin  06/08/2023   Poor fetal growth affecting management of mother in second trimester 05/25/2023   Alpha thalassemia silent carrier 04/08/2023   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   Supervision of high risk pregnancy, antepartum 03/17/2023   Morbid obesity (HCC) 03/17/2023   MDD (major depressive disorder), recurrent, severe, with psychosis (HCC) 08/31/2021   GAD (generalized anxiety disorder) 10/23/2020   Complication of anesthesia 09/04/2020   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   Lumbar radiculopathy 08/17/2019   Uncontrolled type 2 diabetes mellitus with hyperglycemia (HCC) 01/18/2019   Asthma 06/08/2018   Umbilical hernia without obstruction and without gangrene 02/25/2017   Obstructive sleep apnea 12/11/2016   Chronic hypertension in pregnancy 01/23/2016   Depression, recurrent (HCC) 01/05/2009   Severe obesity (BMI >= 40) (HCC) 03/26/2006    Maternal Fetal medicine Consult  Laura Benson is a 33 y.o. G1P0 at [redacted]w[redacted]d here for ultrasound and consultation. [facility] is doing well today with no acute concerns. Today we focused on the following:   CHTN: BP was initially elevated at 159/83. Repeat was 146/84. She denies HA or vision changes.   DM2: Sugars are well controlled for the most part. EFW at the 20%.   The patient had time to ask questions that were answered to her satisfaction.  She verbalized understanding and agrees to proceed with the plan below.  Sonographic findings Single intrauterine pregnancy. Fetal cardiac activity: Observed. Presentation: Cephalic. Interval fetal anatomy appears normal. Fetal biometry shows the estimated fetal weight at the 19  percentile. Amniotic fluid: Within normal limits.    Placenta: Anterior. BPP: 8/8.   There are limitations of prenatal ultrasound such as the inability to detect certain abnormalities due to poor visualization. Various factors such as fetal position, gestational age and maternal body habitus may increase the difficulty in visualizing the fetal anatomy.    There are limitations of prenatal ultrasound such as the inability to detect certain abnormalities due to poor visualization. Various factors such as fetal position, gestational age and maternal body habitus may increase the difficulty in visualizing the fetal anatomy.    Recommendations -Continue twice weekly testing due to [facility], DM2 and lower end of normal weight in the setting of BMI >60.  -Delivery at 36 weeks due to multiple co-morbidities   Review of Systems: A review of systems was performed and was negative except per HPI   Vitals and Physical Exam    09/07/2023    9:50 AM 09/07/2023    9:00 AM 09/04/2023    2:30 PM  Vitals with BMI  Height   5' 6  Weight   414 lbs 8 oz  BMI   66.93  Systolic 146 159 846  Diastolic 84 83 83  Pulse 86 84 89    Sitting comfortably on the sonogram table Nonlabored breathing Normal rate and rhythm Abdomen is nontender  Past pregnancies OB History  Gravida Para Term Preterm AB Living  1       SAB IAB Ectopic Multiple Live Births          # Outcome Date GA Lbr Len/2nd Weight Sex Type Anes PTL Lv  1 Current  I spent 20 minutes reviewing the patients chart, including labs and images as well as counseling the patient about her medical conditions. Greater than 50% of the time was spent in direct face-to-face patient counseling.  Delora Smaller  MFM, [place]   09/07/2023  11:01 AM

## 2023-09-08 ENCOUNTER — Telehealth: Payer: Self-pay

## 2023-09-08 ENCOUNTER — Telehealth: Payer: Self-pay | Admitting: Dietician

## 2023-09-08 DIAGNOSIS — Z006 Encounter for examination for normal comparison and control in clinical research program: Secondary | ICD-10-CM

## 2023-09-08 NOTE — Telephone Encounter (Signed)
 Dexcom Clarity report reviewed.   Post meal glucose 185-205 for the few days. Last documented ICR was 11.    Called patient but she was not available. Recommend that patient change her ICR to 9 and bolus 15 minutes before she eats.  Leita Constable, RD, LDN, CDCES, DipACLM

## 2023-09-08 NOTE — Telephone Encounter (Signed)
 Called pt regarding Impact Mom Trial. Pt did not answer. A message was left with contact information.

## 2023-09-09 ENCOUNTER — Ambulatory Visit: Admitting: *Deleted

## 2023-09-09 ENCOUNTER — Ambulatory Visit: Attending: Obstetrics and Gynecology | Admitting: Obstetrics and Gynecology

## 2023-09-09 DIAGNOSIS — E119 Type 2 diabetes mellitus without complications: Secondary | ICD-10-CM

## 2023-09-09 DIAGNOSIS — O24113 Pre-existing diabetes mellitus, type 2, in pregnancy, third trimester: Secondary | ICD-10-CM | POA: Diagnosis not present

## 2023-09-09 DIAGNOSIS — Z3A35 35 weeks gestation of pregnancy: Secondary | ICD-10-CM

## 2023-09-09 DIAGNOSIS — O99213 Obesity complicating pregnancy, third trimester: Secondary | ICD-10-CM

## 2023-09-09 DIAGNOSIS — E6689 Other obesity not elsewhere classified: Secondary | ICD-10-CM

## 2023-09-09 DIAGNOSIS — O10013 Pre-existing essential hypertension complicating pregnancy, third trimester: Secondary | ICD-10-CM

## 2023-09-09 DIAGNOSIS — E669 Obesity, unspecified: Secondary | ICD-10-CM

## 2023-09-09 DIAGNOSIS — F419 Anxiety disorder, unspecified: Secondary | ICD-10-CM

## 2023-09-09 DIAGNOSIS — O99343 Other mental disorders complicating pregnancy, third trimester: Secondary | ICD-10-CM

## 2023-09-09 NOTE — Progress Notes (Unsigned)
 Unable to perform NST today due to patient habitus and her discomfort.  Dr. Arna notified and he performed a modified BPP.   AFI was 15 cm. FHR 155.

## 2023-09-09 NOTE — Progress Notes (Signed)
 Maternal-Fetal Medicine Consultation Name: Laura Benson MRN: [id number]  G1 P0 at 35w 4d gestation.  Patient is here for NST. Her high risk problems include: Gestational diabetes on insulin  pump.  She reports her diabetes is well-controlled. Chronic hypertension.  Patient takes amlodipine , hydralazine , labetalol .  Blood pressure today at our office is 159/83 mmHg.  She does not have signs and symptoms of severe features of preeclampsia.   Today, we could not obtain NST monitoring because of maternal body habitus and fetal movements.  I performed a bedside ultrasound. Amniotic fluid is normal and good fetal heart activity is seen. AFI is 16 cm. Cephalic presentation. Posterior placenta. I reassured the patient of the findings. Normal amniotic fluid is associated with low-likelihood of fetal demise. Patient has several risk factors, and she will be undergoing induction of labor at [redacted] weeks gestation.  We made an appointment for her to return on 09/14/2023 for BPP. I encouraged her to check her blood pressures regularly. Recommendations - BPP on 09/14/2023.    Consultation including face-to-face (more than 50%) counseling 10 minutes.

## 2023-09-10 NOTE — Telephone Encounter (Signed)
 Contacted patient and [facility]

## 2023-09-11 ENCOUNTER — Ambulatory Visit: Admitting: Family Medicine

## 2023-09-11 ENCOUNTER — Telehealth: Payer: Self-pay

## 2023-09-11 ENCOUNTER — Other Ambulatory Visit (HOSPITAL_COMMUNITY)
Admission: RE | Admit: 2023-09-11 | Discharge: 2023-09-11 | Disposition: A | Discharge status: Home / Self Care | Source: Ambulatory Visit | Attending: Family Medicine | Admitting: Family Medicine

## 2023-09-11 ENCOUNTER — Other Ambulatory Visit: Payer: Self-pay

## 2023-09-11 ENCOUNTER — Encounter: Payer: Self-pay | Admitting: Family Medicine

## 2023-09-11 VITALS — BP 159/102 | HR 96 | Wt >= 6400 oz

## 2023-09-11 DIAGNOSIS — G4733 Obstructive sleep apnea (adult) (pediatric): Secondary | ICD-10-CM

## 2023-09-11 DIAGNOSIS — O10913 Unspecified pre-existing hypertension complicating pregnancy, third trimester: Secondary | ICD-10-CM | POA: Diagnosis not present

## 2023-09-11 DIAGNOSIS — O0993 Supervision of high risk pregnancy, unspecified, third trimester: Secondary | ICD-10-CM | POA: Diagnosis not present

## 2023-09-11 DIAGNOSIS — R768 Other specified abnormal immunological findings in serum: Secondary | ICD-10-CM

## 2023-09-11 DIAGNOSIS — F333 Major depressive disorder, recurrent, severe with psychotic symptoms: Secondary | ICD-10-CM

## 2023-09-11 DIAGNOSIS — O0990 Supervision of high risk pregnancy, unspecified, unspecified trimester: Secondary | ICD-10-CM | POA: Diagnosis present

## 2023-09-11 DIAGNOSIS — Z006 Encounter for examination for normal comparison and control in clinical research program: Secondary | ICD-10-CM

## 2023-09-11 DIAGNOSIS — Z3A35 35 weeks gestation of pregnancy: Secondary | ICD-10-CM

## 2023-09-11 DIAGNOSIS — E1165 Type 2 diabetes mellitus with hyperglycemia: Secondary | ICD-10-CM

## 2023-09-11 DIAGNOSIS — O365930 Maternal care for other known or suspected poor fetal growth, third trimester, not applicable or unspecified: Secondary | ICD-10-CM

## 2023-09-11 DIAGNOSIS — O10919 Unspecified pre-existing hypertension complicating pregnancy, unspecified trimester: Secondary | ICD-10-CM

## 2023-09-11 DIAGNOSIS — O24414 Gestational diabetes mellitus in pregnancy, insulin controlled: Secondary | ICD-10-CM

## 2023-09-11 DIAGNOSIS — O365920 Maternal care for other known or suspected poor fetal growth, second trimester, not applicable or unspecified: Secondary | ICD-10-CM

## 2023-09-11 NOTE — Telephone Encounter (Signed)
 Spoke with pt regarding Impact Mom Trial. Pt read over Impact Mom information sheet. Pt did not have questions. Pt was verbally consented for Impact Mom Trial on 11-Sep-2023 @ 11:51.

## 2023-09-11 NOTE — Patient Instructions (Signed)
 You have constipation which is hard stools that are difficult to pass. It is important to have regular bowel movements every 1-3 days that are soft and easy to pass. Hard stools increase your risk of hemorrhoids and are very uncomfortable.   To prevent constipation you can increase the amount of fiber in your diet. Examples of foods with fiber are leafy greens, whole grain breads, oatmeal and other grains.  It is also important to drink at least eight 8oz glass of water everyday.   If you have not has a bowel movement in 4-5 days you made need to clean out your bowel.  This will have establish normal movement through your bowel.    Miralax Clean out  Take 8 capfuls of miralax in 64 oz of gatorade. You can use any fluid that appeals to you (gatorade, water, juice)  Continue to drink at least eight 8 oz glasses of water throughout the day  You can repeat with another 8 capfuls of miralax in 64 oz of gatorade if you are not having a large amount of stools  You will need to be at home and close to a bathroom for about 8 hours when you do the above as you may need to go to the bathroom frequently.   After you are cleaned out: - Start Colace100mg  twice daily - Start Miralax once daily - Start a daily fiber supplement like metamucil or citrucel - You can safely use enemas in pregnancy  - if you are having diarrhea you can reduce to Colace once a day or miralax every other day or a 1/2 capful daily.

## 2023-09-11 NOTE — Progress Notes (Signed)
 PRENATAL VISIT NOTE  Subjective:  Laura Benson is a 33 y.o. G1P0 at [redacted]w[redacted]d being seen today for ongoing prenatal care.  She is currently monitored for the following issues for this low-risk pregnancy and has Severe obesity (BMI >= 40) (HCC); Depression, recurrent (HCC); Chronic hypertension in pregnancy; Obstructive sleep apnea; Umbilical hernia without obstruction and without gangrene; Asthma; Uncontrolled type 2 diabetes mellitus with hyperglycemia (HCC); S/P laparoscopic sleeve gastrectomy; Lumbar radiculopathy; Complication of anesthesia; GAD (generalized anxiety disorder); MDD (major depressive disorder), recurrent, severe, with psychosis (HCC); Supervision of high risk pregnancy, antepartum; Morbid obesity (HCC); Anti M Antibody positive; Alpha thalassemia silent carrier; Poor fetal growth affecting management of mother in second trimester; and Gestational diabetes mellitus (GDM) requiring insulin  on their problem list.  Patient reports no complaints.  Contractions: Irritability. Vag. Bleeding: None.  Movement: Present. Denies leaking of fluid.   The following portions of the patient's history were reviewed and updated as appropriate: allergies, current medications, past family history, past medical history, past social history, past surgical history and problem list.   Objective:    Vitals:   09/11/23 0907 09/11/23 0917  BP: (!) 170/108 (!) 159/102  Pulse:  96  Weight: (!) 419 lb 8 oz (190.3 kg)     Fetal Status:  Fetal Heart Rate (bpm): 140 Fundal Height: 45 cm Movement: Present Presentation: Vertex  General: Alert, oriented and cooperative. Patient is in no acute distress.  Skin: Skin is warm and dry. No rash noted.   Cardiovascular: Normal heart rate noted  Respiratory: Normal respiratory effort, no problems with respiration noted  Abdomen: Soft, gravid, appropriate for gestational age.  Pain/Pressure: Present     Pelvic: Cervical exam deferred Dilation: Closed Effacement  (%): Thick Station: -3. + notable stool burden in rectum.  Extremities: Normal range of motion.  Edema: None  Mental Status: Normal mood and affect. Normal behavior. Normal judgment and thought content.   Assessment and Plan:  Pregnancy: G1P0 at [redacted]w[redacted]d 1. Supervision of high risk pregnancy, antepartum (Primary) US  performed to confirm vertex and also FHR Reviewed plans for IOL  Discussed that internal monitoring is likely needed given difficulty tracing abdominally Having constipation-- recommended miralax clean out this weekend  - Cervicovaginal ancillary only - Culture, beta strep (group b only)  2. MDD (major depressive disorder), recurrent, severe, with psychosis (HCC) Stable and doing well  3. Chronic hypertension in pregnancy Initially elevated BP Returned to more her normal range, is about 30 minutes from next hydralazine  doses Recommended taking meds and then checking BP 30-=1hr after, if > 160/110 will need to go to the MAU Having some nausea after hydralazine  doses.  Denies HA, changes in vision, RUQ pain.   4. Anti M Antibody positive  5. Gestational diabetes mellitus (GDM) requiring insulin  Has not had monitor for 2 days She is watching what she eats  EFW 2360g, 19th% on 8/11 (AC 76th%, HC is 5th%)  6. Morbid obesity (HCC) TWG=45 lb 8 oz (20.6 kg) which is above goal  7. Obstructive sleep apnea Referred for CPAP  8. Poor fetal growth affecting management of mother in second trimester, single or unspecified fetus IOL scheduled 8.23   9. Uncontrolled type 2 diabetes mellitus with hyperglycemia (HCC)   Preterm labor symptoms and general obstetric precautions including but not limited to vaginal bleeding, contractions, leaking of fluid and fetal movement were reviewed in detail with the patient. Please refer to After Visit Summary for other counseling recommendations.   Return in about 1 week (  around 09/18/2023) for Routine prenatal care.  Future Appointments   Date Time Provider Department Center  09/14/2023  1:15 PM [facility] PROVIDER 1 [facility]  09/14/2023  1:30 PM [facility] US4 [facility]  09/16/2023  9:20 AM Tobb, Kardie, DO [facility]  09/18/2023  8:55 AM [name], MD [facility]  09/19/2023  6:30 AM [facility] SCHED ROOM [facility] None  09/25/2023  8:55 AM [name], MD [facility]  10/02/2023  8:55 AM [name], MD [facility]  10/09/2023  8:55 AM [name], [name], MD [facility]    Suzen Maryan Masters, MD

## 2023-09-14 ENCOUNTER — Telehealth (HOSPITAL_COMMUNITY): Payer: Self-pay | Admitting: *Deleted

## 2023-09-14 ENCOUNTER — Ambulatory Visit (HOSPITAL_BASED_OUTPATIENT_CLINIC_OR_DEPARTMENT_OTHER): Admitting: Obstetrics and Gynecology

## 2023-09-14 ENCOUNTER — Other Ambulatory Visit

## 2023-09-14 ENCOUNTER — Ambulatory Visit (HOSPITAL_BASED_OUTPATIENT_CLINIC_OR_DEPARTMENT_OTHER)

## 2023-09-14 ENCOUNTER — Ambulatory Visit

## 2023-09-14 VITALS — BP 155/80 | HR 85

## 2023-09-14 DIAGNOSIS — O24414 Gestational diabetes mellitus in pregnancy, insulin controlled: Secondary | ICD-10-CM | POA: Insufficient documentation

## 2023-09-14 DIAGNOSIS — O99213 Obesity complicating pregnancy, third trimester: Secondary | ICD-10-CM | POA: Diagnosis not present

## 2023-09-14 DIAGNOSIS — O0990 Supervision of high risk pregnancy, unspecified, unspecified trimester: Secondary | ICD-10-CM | POA: Insufficient documentation

## 2023-09-14 DIAGNOSIS — O10013 Pre-existing essential hypertension complicating pregnancy, third trimester: Secondary | ICD-10-CM | POA: Diagnosis not present

## 2023-09-14 DIAGNOSIS — Z0289 Encounter for other administrative examinations: Secondary | ICD-10-CM

## 2023-09-14 DIAGNOSIS — Z3A36 36 weeks gestation of pregnancy: Secondary | ICD-10-CM | POA: Insufficient documentation

## 2023-09-14 DIAGNOSIS — E119 Type 2 diabetes mellitus without complications: Secondary | ICD-10-CM

## 2023-09-14 DIAGNOSIS — O24113 Pre-existing diabetes mellitus, type 2, in pregnancy, third trimester: Secondary | ICD-10-CM | POA: Diagnosis not present

## 2023-09-14 DIAGNOSIS — E669 Obesity, unspecified: Secondary | ICD-10-CM

## 2023-09-14 LAB — CERVICOVAGINAL ANCILLARY ONLY
Chlamydia: NEGATIVE
Comment: NEGATIVE
Comment: NORMAL
Neisseria Gonorrhea: NEGATIVE

## 2023-09-14 NOTE — Telephone Encounter (Signed)
 Preadmission screen

## 2023-09-14 NOTE — Progress Notes (Signed)
 Maternal-Fetal Medicine Consultation Name: Laura Benson MRN: [id number]  G1 P0 at 36w 2d gestation. Her high risk problems include: -Gestational diabetes on insulin  pump. She reports she still has some abnormal values. -Chronic hypertension. Patient takes amlodipine , hydralazine , labetalol . Blood pressure today at our office is 155/80 mmHg. She does not have signs and symptoms of severe features of preeclampsia.   Patient reports good fetal movements.  Ultrasound Amniotic fluid is normal and good fetal activity seen.  Cephalic presentation.  Antenatal testing is reassuring.  BPP 8/8.  Patient has comorbid conditions of chronic hypertension and poorly controlled diabetes.  She is scheduled to undergo induction of labor at [redacted] weeks gestation.  I counseled the patient that antenatal testing has limitations in predicting fetal compromise. I have recommended delivery earlier than [redacted] weeks gestation.  Patient would like to be delivered on Thursday (09/17/2023).  I called labor and delivery and discussed with Dr. Meredeth.  Patient will be undergoing induction of labor on 09/17/2023.  I instructed the patient to come to the hospital if she experiences decreased fetal movements.   Recommendations -IOL on 09/17/23.   Consultation including face-to-face (more than 50%) counseling 20 minutes.

## 2023-09-15 ENCOUNTER — Telehealth (HOSPITAL_COMMUNITY): Payer: Self-pay | Admitting: *Deleted

## 2023-09-15 ENCOUNTER — Encounter (HOSPITAL_COMMUNITY): Payer: Self-pay | Admitting: *Deleted

## 2023-09-15 LAB — CULTURE, BETA STREP (GROUP B ONLY): Strep Gp B Culture: NEGATIVE

## 2023-09-15 NOTE — Telephone Encounter (Signed)
 Preadmission screen

## 2023-09-16 ENCOUNTER — Ambulatory Visit: Attending: Cardiology | Admitting: Cardiology

## 2023-09-16 ENCOUNTER — Telehealth: Payer: Self-pay

## 2023-09-16 ENCOUNTER — Other Ambulatory Visit (HOSPITAL_COMMUNITY): Payer: Self-pay

## 2023-09-16 ENCOUNTER — Ambulatory Visit: Payer: Self-pay | Admitting: Family Medicine

## 2023-09-16 ENCOUNTER — Encounter: Payer: Self-pay | Admitting: Cardiology

## 2023-09-16 VITALS — BP 168/78 | HR 86 | Ht 66.0 in | Wt >= 6400 oz

## 2023-09-16 DIAGNOSIS — O10919 Unspecified pre-existing hypertension complicating pregnancy, unspecified trimester: Secondary | ICD-10-CM

## 2023-09-16 DIAGNOSIS — Z3A36 36 weeks gestation of pregnancy: Secondary | ICD-10-CM | POA: Diagnosis not present

## 2023-09-16 MED ORDER — HYDRALAZINE HCL 50 MG PO TABS: 50.0000 mg | ORAL_TABLET | Freq: Three times a day (TID) | ORAL | 1 refills | Status: DC

## 2023-09-16 MED ORDER — BLOOD PRESSURE MONITOR AUTOMAT DEVI
1.0000 [IU] | Freq: Once | 0 refills | Status: DC
Start: 2023-09-16 — End: 2023-09-20
  Filled 2023-09-16: qty 1, 30d supply, fill #0

## 2023-09-16 NOTE — Telephone Encounter (Signed)
 Patient states she is being induced 09-17-23 and would like to be called for her PharmD appt after she has had the baby.   09-16-23 VB

## 2023-09-16 NOTE — Patient Instructions (Signed)
 Medication Instructions:  Your physician has recommended you make the following change in your medication:  INCREASE: Hydralazine  50 mg three times  *If you need a refill on your cardiac medications before your next appointment, please call your pharmacy*  Follow-Up: At [facility], you and your health needs are our priority.  As part of our continuing mission to provide you with exceptional heart care, our providers are all part of one team.  This team includes your primary Cardiologist (physician) and Advanced Practice Providers or APPs (Physician Assistants and Nurse Practitioners) who all work together to provide you with the care you need, when you need it.  Your next appointment:   Sept 11th at 9:20am  Provider:   Kardie Tobb, DO

## 2023-09-17 ENCOUNTER — Encounter (HOSPITAL_COMMUNITY): Payer: Self-pay | Admitting: Anesthesiology

## 2023-09-17 ENCOUNTER — Encounter (HOSPITAL_COMMUNITY): Payer: Self-pay | Admitting: Family Medicine

## 2023-09-17 ENCOUNTER — Other Ambulatory Visit: Payer: Self-pay

## 2023-09-17 ENCOUNTER — Inpatient Hospital Stay (HOSPITAL_COMMUNITY)
Admission: RE | Admit: 2023-09-17 | Discharge: 2023-09-25 | DRG: 786 | Disposition: A | Discharge status: Home / Self Care | Attending: Obstetrics and Gynecology | Admitting: Obstetrics and Gynecology

## 2023-09-17 ENCOUNTER — Inpatient Hospital Stay (HOSPITAL_COMMUNITY)

## 2023-09-17 DIAGNOSIS — Z87891 Personal history of nicotine dependence: Secondary | ICD-10-CM

## 2023-09-17 DIAGNOSIS — O9953 Diseases of the respiratory system complicating the puerperium: Secondary | ICD-10-CM | POA: Diagnosis present

## 2023-09-17 DIAGNOSIS — O99354 Diseases of the nervous system complicating childbirth: Secondary | ICD-10-CM | POA: Diagnosis present

## 2023-09-17 DIAGNOSIS — R0682 Tachypnea, not elsewhere classified: Secondary | ICD-10-CM | POA: Diagnosis present

## 2023-09-17 DIAGNOSIS — O9081 Anemia of the puerperium: Secondary | ICD-10-CM | POA: Diagnosis not present

## 2023-09-17 DIAGNOSIS — O99214 Obesity complicating childbirth: Secondary | ICD-10-CM | POA: Diagnosis present

## 2023-09-17 DIAGNOSIS — R0603 Acute respiratory distress: Secondary | ICD-10-CM | POA: Diagnosis not present

## 2023-09-17 DIAGNOSIS — R768 Other specified abnormal immunological findings in serum: Secondary | ICD-10-CM | POA: Diagnosis present

## 2023-09-17 DIAGNOSIS — E66813 Obesity, class 3: Secondary | ICD-10-CM | POA: Diagnosis present

## 2023-09-17 DIAGNOSIS — M5416 Radiculopathy, lumbar region: Secondary | ICD-10-CM

## 2023-09-17 DIAGNOSIS — O24414 Gestational diabetes mellitus in pregnancy, insulin controlled: Secondary | ICD-10-CM | POA: Diagnosis not present

## 2023-09-17 DIAGNOSIS — I82622 Acute embolism and thrombosis of deep veins of left upper extremity: Secondary | ICD-10-CM | POA: Diagnosis not present

## 2023-09-17 DIAGNOSIS — O114 Pre-existing hypertension with pre-eclampsia, complicating childbirth: Secondary | ICD-10-CM | POA: Diagnosis present

## 2023-09-17 DIAGNOSIS — O99844 Bariatric surgery status complicating childbirth: Secondary | ICD-10-CM | POA: Diagnosis present

## 2023-09-17 DIAGNOSIS — Z8249 Family history of ischemic heart disease and other diseases of the circulatory system: Secondary | ICD-10-CM

## 2023-09-17 DIAGNOSIS — O24119 Pre-existing diabetes mellitus, type 2, in pregnancy, unspecified trimester: Principal | ICD-10-CM | POA: Diagnosis present

## 2023-09-17 DIAGNOSIS — Z79899 Other long term (current) drug therapy: Secondary | ICD-10-CM

## 2023-09-17 DIAGNOSIS — Z794 Long term (current) use of insulin: Secondary | ICD-10-CM | POA: Diagnosis not present

## 2023-09-17 DIAGNOSIS — O365930 Maternal care for other known or suspected poor fetal growth, third trimester, not applicable or unspecified: Secondary | ICD-10-CM | POA: Diagnosis not present

## 2023-09-17 DIAGNOSIS — E119 Type 2 diabetes mellitus without complications: Secondary | ICD-10-CM | POA: Diagnosis present

## 2023-09-17 DIAGNOSIS — G4733 Obstructive sleep apnea (adult) (pediatric): Secondary | ICD-10-CM | POA: Diagnosis not present

## 2023-09-17 DIAGNOSIS — O2412 Pre-existing diabetes mellitus, type 2, in childbirth: Secondary | ICD-10-CM | POA: Diagnosis present

## 2023-09-17 DIAGNOSIS — Z9641 Presence of insulin pump (external) (internal): Secondary | ICD-10-CM | POA: Diagnosis present

## 2023-09-17 DIAGNOSIS — O10919 Unspecified pre-existing hypertension complicating pregnancy, unspecified trimester: Secondary | ICD-10-CM | POA: Diagnosis present

## 2023-09-17 DIAGNOSIS — J9602 Acute respiratory failure with hypercapnia: Secondary | ICD-10-CM | POA: Diagnosis not present

## 2023-09-17 DIAGNOSIS — N179 Acute kidney failure, unspecified: Secondary | ICD-10-CM | POA: Diagnosis not present

## 2023-09-17 DIAGNOSIS — J810 Acute pulmonary edema: Secondary | ICD-10-CM | POA: Diagnosis not present

## 2023-09-17 DIAGNOSIS — Z833 Family history of diabetes mellitus: Secondary | ICD-10-CM

## 2023-09-17 DIAGNOSIS — I82612 Acute embolism and thrombosis of superficial veins of left upper extremity: Secondary | ICD-10-CM | POA: Insufficient documentation

## 2023-09-17 DIAGNOSIS — O871 Deep phlebothrombosis in the puerperium: Secondary | ICD-10-CM | POA: Diagnosis not present

## 2023-09-17 DIAGNOSIS — O9049 Other postpartum acute kidney failure: Secondary | ICD-10-CM | POA: Diagnosis not present

## 2023-09-17 DIAGNOSIS — Z148 Genetic carrier of other disease: Secondary | ICD-10-CM

## 2023-09-17 DIAGNOSIS — Z3A37 37 weeks gestation of pregnancy: Secondary | ICD-10-CM | POA: Diagnosis not present

## 2023-09-17 DIAGNOSIS — J95821 Acute postprocedural respiratory failure: Secondary | ICD-10-CM | POA: Diagnosis not present

## 2023-09-17 DIAGNOSIS — D62 Acute posthemorrhagic anemia: Secondary | ICD-10-CM | POA: Diagnosis not present

## 2023-09-17 DIAGNOSIS — O99344 Other mental disorders complicating childbirth: Secondary | ICD-10-CM | POA: Diagnosis present

## 2023-09-17 DIAGNOSIS — F411 Generalized anxiety disorder: Secondary | ICD-10-CM | POA: Diagnosis present

## 2023-09-17 DIAGNOSIS — O1092 Unspecified pre-existing hypertension complicating childbirth: Secondary | ICD-10-CM | POA: Diagnosis present

## 2023-09-17 DIAGNOSIS — O1414 Severe pre-eclampsia complicating childbirth: Secondary | ICD-10-CM | POA: Diagnosis not present

## 2023-09-17 DIAGNOSIS — Z3A36 36 weeks gestation of pregnancy: Secondary | ICD-10-CM

## 2023-09-17 DIAGNOSIS — J9601 Acute respiratory failure with hypoxia: Secondary | ICD-10-CM | POA: Diagnosis not present

## 2023-09-17 DIAGNOSIS — E662 Morbid (severe) obesity with alveolar hypoventilation: Secondary | ICD-10-CM

## 2023-09-17 DIAGNOSIS — F32A Depression, unspecified: Secondary | ICD-10-CM | POA: Diagnosis present

## 2023-09-17 DIAGNOSIS — J189 Pneumonia, unspecified organism: Secondary | ICD-10-CM | POA: Diagnosis not present

## 2023-09-17 DIAGNOSIS — Z98891 History of uterine scar from previous surgery: Secondary | ICD-10-CM

## 2023-09-17 DIAGNOSIS — I10 Essential (primary) hypertension: Secondary | ICD-10-CM | POA: Diagnosis not present

## 2023-09-17 DIAGNOSIS — Z8759 Personal history of other complications of pregnancy, childbirth and the puerperium: Secondary | ICD-10-CM | POA: Diagnosis present

## 2023-09-17 DIAGNOSIS — M7989 Other specified soft tissue disorders: Secondary | ICD-10-CM | POA: Diagnosis not present

## 2023-09-17 DIAGNOSIS — R0602 Shortness of breath: Secondary | ICD-10-CM | POA: Diagnosis not present

## 2023-09-17 DIAGNOSIS — O119 Pre-existing hypertension with pre-eclampsia, unspecified trimester: Secondary | ICD-10-CM | POA: Diagnosis present

## 2023-09-17 LAB — CBC
HCT: 33.9 % — ABNORMAL LOW (ref 36.0–46.0)
Hemoglobin: 10.8 g/dL — ABNORMAL LOW (ref 12.0–15.0)
MCH: 24.3 pg — ABNORMAL LOW (ref 26.0–34.0)
MCHC: 31.9 g/dL (ref 30.0–36.0)
MCV: 76.2 fL — ABNORMAL LOW (ref 80.0–100.0)
Platelets: 256 K/uL (ref 150–400)
RBC: 4.45 MIL/uL (ref 3.87–5.11)
RDW: 14.6 % (ref 11.5–15.5)
WBC: 5.4 K/uL (ref 4.0–10.5)
nRBC: 0 % (ref 0.0–0.2)

## 2023-09-17 LAB — COMPREHENSIVE METABOLIC PANEL WITH GFR
ALT: 11 U/L (ref 0–44)
AST: 34 U/L (ref 15–41)
Albumin: 2.7 g/dL — ABNORMAL LOW (ref 3.5–5.0)
Alkaline Phosphatase: 77 U/L (ref 38–126)
Anion gap: 11 (ref 5–15)
BUN: 9 mg/dL (ref 6–20)
CO2: 21 mmol/L — ABNORMAL LOW (ref 22–32)
Calcium: 9.1 mg/dL (ref 8.9–10.3)
Chloride: 106 mmol/L (ref 98–111)
Creatinine, Ser: 1 mg/dL (ref 0.44–1.00)
GFR, Estimated: >60 mL/min (ref >60)
Glucose, Bld: 128 mg/dL — ABNORMAL HIGH (ref 70–99)
Potassium: 4.3 mmol/L (ref 3.5–5.1)
Sodium: 138 mmol/L (ref 135–145)
Total Bilirubin: 1 mg/dL (ref 0.0–1.2)
Total Protein: 5.9 g/dL — ABNORMAL LOW (ref 6.5–8.1)

## 2023-09-17 LAB — GLUCOSE, CAPILLARY: Glucose-Capillary: 98 mg/dL (ref 70–99)

## 2023-09-17 LAB — PROTEIN / CREATININE RATIO, URINE
Creatinine, Urine: 341 mg/dL
Protein Creatinine Ratio: 0.07 mg/mg{creat} (ref 0.00–0.15)
Total Protein, Urine: 25 mg/dL

## 2023-09-17 MED ORDER — MISOPROSTOL 50MCG HALF TABLET
50.0000 ug | ORAL_TABLET | ORAL | Status: DC
  Administered 2023-09-17 – 2023-09-18 (×3): 50 ug via BUCCAL
  Filled 2023-09-17 (×3): qty 1

## 2023-09-17 MED ORDER — LABETALOL HCL 5 MG/ML IV SOLN: 20.0000 mg | INTRAVENOUS | Status: DC | PRN

## 2023-09-17 MED ORDER — LACTATED RINGERS IV SOLN: INTRAVENOUS | Status: DC

## 2023-09-17 MED ORDER — SERTRALINE HCL 50 MG PO TABS
50.0000 mg | ORAL_TABLET | Freq: Every day | ORAL | Status: DC
  Administered 2023-09-18: 50 mg via ORAL
  Filled 2023-09-17 (×2): qty 1

## 2023-09-17 MED ORDER — MISOPROSTOL 25 MCG QUARTER TABLET
25.0000 ug | ORAL_TABLET | Freq: Once | ORAL | Status: AC
  Administered 2023-09-17: 25 ug via VAGINAL
  Filled 2023-09-17: qty 1

## 2023-09-17 MED ORDER — PHENYLEPHRINE 80 MCG/ML (10ML) SYRINGE FOR IV PUSH (FOR BLOOD PRESSURE SUPPORT): 80.0000 ug | PREFILLED_SYRINGE | INTRAVENOUS | Status: DC | PRN

## 2023-09-17 MED ORDER — HYDRALAZINE HCL 20 MG/ML IJ SOLN: 10.0000 mg | INTRAMUSCULAR | Status: DC | PRN

## 2023-09-17 MED ORDER — ACETAMINOPHEN 325 MG PO TABS
650.0000 mg | ORAL_TABLET | ORAL | Status: DC | PRN
  Administered 2023-09-18 (×2): 650 mg via ORAL
  Filled 2023-09-17 (×2): qty 2

## 2023-09-17 MED ORDER — LORAZEPAM 0.5 MG PO TABS
1.0000 mg | ORAL_TABLET | Freq: Four times a day (QID) | ORAL | Status: DC | PRN
  Administered 2023-09-17 – 2023-09-18 (×2): 1 mg via ORAL
  Filled 2023-09-17 (×2): qty 2

## 2023-09-17 MED ORDER — LIDOCAINE HCL (PF) 1 % IJ SOLN: 30.0000 mL | INTRAMUSCULAR | Status: DC | PRN

## 2023-09-17 MED ORDER — AMLODIPINE BESYLATE 10 MG PO TABS
10.0000 mg | ORAL_TABLET | Freq: Every day | ORAL | Status: DC
  Administered 2023-09-18: 10 mg via ORAL
  Filled 2023-09-17 (×2): qty 1

## 2023-09-17 MED ORDER — DIPHENHYDRAMINE HCL 50 MG/ML IJ SOLN
12.5000 mg | INTRAMUSCULAR | Status: DC | PRN
  Administered 2023-09-18: 12.5 mg via INTRAVENOUS
  Filled 2023-09-17: qty 1

## 2023-09-17 MED ORDER — OXYTOCIN BOLUS FROM INFUSION: 333.0000 mL | Freq: Once | INTRAVENOUS | Status: DC

## 2023-09-17 MED ORDER — MISOPROSTOL 50MCG HALF TABLET
50.0000 ug | ORAL_TABLET | Freq: Once | ORAL | Status: AC
  Administered 2023-09-17: 50 ug via BUCCAL
  Filled 2023-09-17: qty 1

## 2023-09-17 MED ORDER — LACTATED RINGERS IV SOLN
500.0000 mL | Freq: Once | INTRAVENOUS | Status: AC
  Administered 2023-09-18: 500 mL via INTRAVENOUS

## 2023-09-17 MED ORDER — EPHEDRINE 5 MG/ML INJ
10.0000 mg | INTRAVENOUS | Status: DC | PRN
Start: 2023-09-17 — End: 2023-09-19

## 2023-09-17 MED ORDER — LACTATED RINGERS IV SOLN: 500.0000 mL | INTRAVENOUS | Status: DC | PRN

## 2023-09-17 MED ORDER — INSULIN PUMP
SUBCUTANEOUS | Status: DC
  Filled 2023-09-17: qty 1

## 2023-09-17 MED ORDER — HYDRALAZINE HCL 50 MG PO TABS
50.0000 mg | ORAL_TABLET | Freq: Three times a day (TID) | ORAL | Status: DC
  Administered 2023-09-17 – 2023-09-18 (×5): 50 mg via ORAL
  Filled 2023-09-17 (×6): qty 1

## 2023-09-17 MED ORDER — LABETALOL HCL 5 MG/ML IV SOLN: 40.0000 mg | INTRAVENOUS | Status: DC | PRN

## 2023-09-17 MED ORDER — LABETALOL HCL 5 MG/ML IV SOLN: 80.0000 mg | INTRAVENOUS | Status: DC | PRN

## 2023-09-17 MED ORDER — LABETALOL HCL 100 MG PO TABS
300.0000 mg | ORAL_TABLET | Freq: Three times a day (TID) | ORAL | Status: DC
  Administered 2023-09-17 – 2023-09-18 (×5): 300 mg via ORAL
  Filled 2023-09-17 (×7): qty 1

## 2023-09-17 MED ORDER — FENTANYL-BUPIVACAINE-NACL 0.5-0.125-0.9 MG/250ML-% EP SOLN
12.0000 mL/h | EPIDURAL | Status: DC | PRN
  Administered 2023-09-18 – 2023-09-19 (×2): 12 mL/h via EPIDURAL
  Filled 2023-09-17 (×2): qty 250

## 2023-09-17 MED ORDER — FENTANYL CITRATE (PF) 100 MCG/2ML IJ SOLN
50.0000 ug | INTRAMUSCULAR | Status: DC | PRN
  Administered 2023-09-17 – 2023-09-18 (×5): 100 ug via INTRAVENOUS
  Filled 2023-09-17 (×5): qty 2

## 2023-09-17 MED ORDER — OXYTOCIN-SODIUM CHLORIDE 30-0.9 UT/500ML-% IV SOLN
2.5000 [IU]/h | INTRAVENOUS | Status: DC
  Filled 2023-09-17: qty 500

## 2023-09-17 MED ORDER — ONDANSETRON HCL 4 MG/2ML IJ SOLN
4.0000 mg | Freq: Four times a day (QID) | INTRAMUSCULAR | Status: DC | PRN
  Administered 2023-09-18: 4 mg via INTRAVENOUS
  Filled 2023-09-17: qty 2

## 2023-09-17 MED ORDER — SOD CITRATE-CITRIC ACID 500-334 MG/5ML PO SOLN
30.0000 mL | ORAL | Status: DC | PRN
  Filled 2023-09-17: qty 30

## 2023-09-17 MED ORDER — EPHEDRINE 5 MG/ML INJ: 10.0000 mg | INTRAVENOUS | Status: DC | PRN

## 2023-09-17 MED ORDER — TERBUTALINE SULFATE 1 MG/ML IJ SOLN
0.2500 mg | Freq: Once | INTRAMUSCULAR | Status: DC | PRN
  Filled 2023-09-17: qty 1

## 2023-09-17 NOTE — Anesthesia Preprocedure Evaluation (Addendum)
 Anesthesia Evaluation  Patient identified by MRN, date of birth, ID band Patient awake    Reviewed: Allergy & Precautions, Patient's Chart, lab work & pertinent test results  History of Anesthesia Complications (+) history of anesthetic complications  Airway Mallampati: III  TM Distance: >3 FB     Dental no notable dental hx. (+) Teeth Intact   Pulmonary shortness of breath, with exertion, at rest and lying, asthma , sleep apnea , former smoker Had tonsillectomy 18 years ago, developed collapsed lung, was transferred to [facility], was on ventilator for 7 days.   breath sounds clear to auscultation + decreased breath sounds      Cardiovascular hypertension, Pt. on medications and Pt. on home beta blockers Normal cardiovascular exam Rhythm:Regular Rate:Normal     Neuro/Psych  PSYCHIATRIC DISORDERS Anxiety Depression     Neuromuscular disease    GI/Hepatic Neg liver ROS,,,S/P Gastric sleeve 03/2020   Endo/Other  diabetes, Well Controlled, Insulin  Dependent  Class 4 obesityPre diabetes  Renal/GU negative Renal ROS  negative genitourinary   Musculoskeletal negative musculoskeletal ROS (+)  Right side radiculopathy sciatic nerve damage   Abdominal  (+) + obese  Peds  Hematology  (+) Blood dyscrasia, anemia   Anesthesia Other Findings   Reproductive/Obstetrics (+) Pregnancy 36 5/7 weeks being induced for cHTN                              Anesthesia Physical Anesthesia Plan  ASA: 3  Anesthesia Plan: Epidural and Spinal   Post-op Pain Management:    Induction:   PONV Risk Score and Plan:   Airway Management Planned: Natural Airway  Additional Equipment: Fetal Monitoring and None  Intra-op Plan:   Post-operative Plan:   Informed Consent: I have reviewed the patients History and Physical, chart, labs and discussed the procedure including the risks, benefits and alternatives for the  proposed anesthesia with the patient or authorized representative who has indicated his/her understanding and acceptance.     Dental advisory given  Plan Discussed with: CRNA and Anesthesiologist  Anesthesia Plan Comments: (I had a long discussion with the patient regarding anesthesia and Labor analgesia. I discussed the risks/ benefits and alternatives of GA, SAB and LEA for labor pain. We discussed the anesthesia for C/Sections. She appeared to understand. Her questions were answered.)         Anesthesia Quick Evaluation

## 2023-09-17 NOTE — H&P (Signed)
 OBSTETRIC ADMISSION HISTORY AND PHYSICAL  Laura Benson is 33 y.o. G1P0 with IUP at [redacted]w[redacted]d 10/10/2023, by Last Menstrual Period presenting for IOL for  uncontrolled cHTN and adequately controlled Type 2 DM on insulin  pump. She received her prenatal care at [facility].  BMI 68. S/P gastric sleeve 2022.  CHTN meds Labetalol  300 TID, Amlodipine  10 mg QD, Hydralazine  50 TID.   Reports Hx respiratory failure after tonsillectomy in 2002 resulting in being in a coma for 2 weeks. Also reports having a hard time waking up after another surgery.   ROS (+) FM, ctx none  (-) VB, LOF. HA, visual changes, CP, SOB, RUQ pain, peripheral edema.   Prenatal History/Complications NURSING  PROVIDER  Conservator, [employer] [employer] Dating by LMP c/w U/S at 8 wks  [facility] Model Mom-Baby Dyad Anatomy U/S Normal, f/w MFM  Initiated care at  10wks                 Language  English               LAB RESULTS   Support Person Doula , FOB, sister  Genetics NIPS: Insufficient fetal DNA x2 AFP: neg      NT/IT (FT only)        Carrier Screen Horizon: Alpha thal Alva  Rhogam  O/Positive/-- (02/25 1558) A1C/GTT T2DM   Flu Vaccine        TDaP Vaccine  7/18 Blood Type O/Positive/-- (02/25 1558)  RSV Vaccine   Antibody Positive, See Final Results (04/23 1548)  COVID Vaccine   Rubella 7.93 (02/25 1558)  Feeding Plan both RPR Non Reactive (06/27 1008)  Contraception no HBsAg Negative (02/25 1558)  Circumcision If boy yes  HIV Non Reactive (06/27 1008)  Pediatrician  Mom bby dyad  HCVAb Non Reactive (02/25 1558)  Prenatal Classes        BTL Consent   Pap       Diagnosis  Date Value Ref Range Status  03/24/2023     Final    - Negative for intraepithelial lesion or malignancy (NILM)    BTL Pre-payment   GC/CT Initial:  neg/neg 36wks:    VBAC Consent   GBS Neg  BRx Optimized? [ ]  yes   [ ]  no      DME Rx [ N/a] BP cuff [ N/a] Weight Scale Waterbirth  [ ]  Class [ ]  Consent [ ]  CNM visit  PHQ9 & GAD7 [  ] new OB [  ]  28 weeks  [  ] 36 weeks Induction  [ ]  Orders Entered [ ] Foley Y/N   OB History  Gravida Para Term Preterm AB Living  1       SAB IAB Ectopic Multiple Live Births          # Outcome Date GA Lbr Len/2nd Weight Sex Type Anes PTL Lv  1 Current            Patient Active Problem List   Diagnosis Date Noted   Type 2 diabetes mellitus during pregnancy 09/17/2023   Gestational diabetes mellitus (GDM) requiring insulin  06/08/2023   Poor fetal growth affecting management of mother in second trimester 05/25/2023   Alpha thalassemia silent carrier 04/08/2023   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   Supervision of high risk pregnancy, antepartum 03/17/2023   Morbid obesity (HCC) 03/17/2023   MDD (major depressive disorder), recurrent, severe, with psychosis (HCC) 08/31/2021   GAD (generalized anxiety disorder) 10/23/2020   Complication of  anesthesia 09/04/2020   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   Lumbar radiculopathy 08/17/2019   Uncontrolled type 2 diabetes mellitus with hyperglycemia (HCC) 01/18/2019   Asthma 06/08/2018   Umbilical hernia without obstruction and without gangrene 02/25/2017   Obstructive sleep apnea 12/11/2016   Chronic hypertension in pregnancy 01/23/2016   Depression, recurrent (HCC) 01/05/2009   Severe obesity (BMI >= 40) (HCC) 03/26/2006    Past Medical History: Past Medical History:  Diagnosis Date   Anxiety    Asthma    Complication of anesthesia    Take for a while to wake up   Depression    Diabetes mellitus without complication (HCC)    Hypertension    Morbid obesity (HCC)    PCOS (polycystic ovarian syndrome)    Sleep apnea    CPAP   Tachycardia     Past Surgical History: Past Surgical History:  Procedure Laterality Date   ADENOIDECTOMY     LAPAROSCOPIC GASTRIC SLEEVE RESECTION N/A 04/16/2020   Procedure: LAPAROSCOPIC GASTRIC SLEEVE RESECTION;  Surgeon: Tanda Locus, MD;  Location: [facility];  Service: General;  Laterality: N/A;    TONSILLECTOMY     UPPER GI ENDOSCOPY N/A 04/16/2020   Procedure: UPPER GI ENDOSCOPY;  Surgeon: Tanda Locus, MD;  Location: [facility];  Service: General;  Laterality: N/A;    Social History Social History   Socioeconomic History   Marital status: Single    Spouse name: Not on file   Number of children: 0   Years of education: Not on file   Highest education level: 12th grade  Occupational History    Employer: [employer]  Tobacco Use   Smoking status: Former    Types: Cigarettes   Smokeless tobacco: Never  Vaping Use   Vaping status: Never Used  Substance and Sexual Activity   Alcohol use: No   Drug use: Never   Sexual activity: Yes    Birth control/protection: None  Other Topics Concern   Not on file  Social History Narrative   Not on file   Social Drivers of Health   Financial Resource Strain: Low Risk  (04/21/2023)   Overall Financial Resource Strain (CARDIA)    Difficulty of Paying Living Expenses: Not hard at all  Food Insecurity: No Food Insecurity (04/27/2023)   Hunger Vital Sign    Worried About Running Out of Food in the Last Year: Never true    Ran Out of Food in the Last Year: Never true  Recent Concern: Food Insecurity - Food Insecurity Present (04/21/2023)   Hunger Vital Sign    Worried About Running Out of Food in the Last Year: Never true    Ran Out of Food in the Last Year: Sometimes true  Transportation Needs: No Transportation Needs (04/27/2023)   PRAPARE - Administrator, Civil Service (Medical): No    Lack of Transportation (Non-Medical): No  Physical Activity: Sufficiently Active (04/21/2023)   Exercise Vital Sign    Days of Exercise per Week: 4 days    Minutes of Exercise per Session: 150+ min  Stress: No Stress Concern Present (04/21/2023)   [employer] of Occupational Health - Occupational Stress Questionnaire    Feeling of Stress : Only a little  Social Connections: Moderately Isolated (04/21/2023)   Social Connection and Isolation Panel     Frequency of Communication with Friends and Family: More than three times a week    Frequency of Social Gatherings with Friends and Family: More than three times a week  Attends Religious Services: 1 to 4 times per year    Active Member of Clubs or Organizations: No    Attends Engineer, structural: Not on file    Marital Status: Never married    Family History: Family History  Problem Relation Age of Onset   Stroke Mother    Hypertension Mother    Diabetes Mother    Hypertension Father    Diabetes Father    Colon cancer Father    Diabetes Sister    Hypertension Maternal Grandmother    Hypertension Paternal Grandmother    Diabetes Paternal Grandmother    Breast cancer Cousin     Allergies: No Known Allergies  Medications Prior to Admission  Medication Sig Dispense Refill Last Dose/Taking   acetaminophen -caffeine (EXCEDRIN  TENSION HEADACHE) 500-65 MG TABS per tablet Take 2 tablets by mouth every 8 (eight) hours as needed. 60 tablet 0    albuterol  (VENTOLIN  HFA) 108 (90 Base) MCG/ACT inhaler Inhale 2 puffs into the lungs every 6 (six) hours as needed for wheezing or shortness of breath. 6.7 g 0    amLODipine  (NORVASC ) 10 MG tablet Take 1 tablet (10 mg total) by mouth daily. 90 tablet 3    aspirin  EC 81 MG tablet Take 1 tablet (81 mg total) by mouth 2 (two) times daily. Swallow whole. 90 tablet 4    Blood Pressure Monitoring (BLOOD PRESSURE MONITOR AUTOMAT) DEVI Use as directed. 1 each 0    Continuous Glucose Receiver (DEXCOM G7 RECEIVER) DEVI 1 Units by Does not apply route daily. 1 each 1    Continuous Glucose Sensor (DEXCOM G7 SENSOR) MISC Apply one sensor every 10 days ICD10: O24.414 (Gestational diabetes, insulin  dependent) 3 each 12    fluticasone  (FLONASE ) 50 MCG/ACT nasal spray Place 2 sprays into both nostrils daily. 9.9 mL 0    hydrALAZINE  (APRESOLINE ) 50 MG tablet Take 1 tablet (50 mg total) by mouth 3 (three) times daily. 270 tablet 1    Insulin  Disposable  Pump (OMNIPOD 5 DEXG7G6 INTRO GEN 5) KIT 1 Device by Does not apply route every 3 (three) days. 1 kit 0    Insulin  Disposable Pump (OMNIPOD 5 DEXG7G6 PODS GEN 5) MISC 1 Device by Does not apply route every 3 (three) days. 3 each 8    insulin  lispro (HUMALOG ) 100 UNIT/ML injection For use with insulin  pump. Basal rate: 0.5 u/hr. Bolus: 5 units TID with meals. 10 mL 11    Insulin  Pen Needle (BD PEN NEEDLE NANO 2ND GEN) 32G X 4 MM MISC 1 Units by Does not apply route daily. 100 each 1    labetalol  (NORMODYNE ) 300 MG tablet Take 1 tablet (300 mg total) by mouth 3 (three) times daily. Take 1 tablet (200 mg total) by mouth 2 (two) times daily. 90 tablet 3    Prenatal 28-0.8 MG TABS Take 1 tablet by mouth daily. 30 tablet 12    promethazine  (PHENERGAN ) 25 MG tablet Take 1 tablet (25 mg total) by mouth every 6 (six) hours as needed for nausea or vomiting. 30 tablet 0    sertraline  (ZOLOFT ) 50 MG tablet Take 1 tablet (50 mg total) by mouth daily. 90 tablet 4    traMADol  (ULTRAM ) 50 MG tablet Take 1 tablet (50 mg total) by mouth every 6 (six) hours as needed for moderate pain (pain score 4-6). 15 tablet 0      Review of Systems  All systems reviewed and negative except as stated in HPI  PHYSICAL EXAM Blood  pressure (!) 142/79, pulse 74, temperature 98 F (36.7 C), temperature source Oral, height 5' 6 (1.676 m), weight (!) 193.6 kg, last menstrual period 01/03/2023.  General appearance: alert, cooperative, no distress, morbidly obese, and anxious Lungs: respirations nonlabored Heart: regular rate Abdomen: gravid  Fetal monitoringBaseline: 130 bpm, Variability: Good {> 6 bpm), Accelerations: Reactive, and Decelerations: Absent Uterine activity: rare, mild    Presentation: cephalic   Prenatal labs: ABO, Rh: O/Positive/-- (02/25 1558) Antibody: Positive, See Final Results (04/23 1548) Rubella: 7.93 (02/25 1558) RPR: Non Reactive (06/27 1008)  HBsAg: Negative (02/25 1558)  HIV: Non Reactive  (06/27 1008)  GBS: Negative/-- (08/15 1118)    Lab Results  Component Value Date   GBS Negative 09/11/2023   Maternal Diabetes: Yes:  Diabetes Type:  Pre-pregnancy Genetic Screening: AFP neg Sono 09/07/23: Est. FW: 2360 gm 5 lb 3 oz 19 %   Fetal Echo: Nml but limited  Immunization History  Administered Date(s) Administered   H1N1 12/02/2007   PPD Test 02/22/2021   Pneumococcal Polysaccharide-23 11/01/2019   Tdap 01/18/2019, 08/14/2023    Prenatal Transfer Tool  Maternal Diabetes: Yes:  Diabetes Type:  Pre-pregnancy Genetic Screening: Normal Maternal Ultrasounds/Referrals: Normal Fetal Ultrasounds or other Referrals:  Fetal echo, Referred to Materal Fetal Medicine  Maternal Substance Abuse:  No Significant Maternal Medications:  Meds include: Zoloft  Other:  Significant Maternal Lab Results: Group B Strep negative Number of Prenatal Visits:greater than 3 verified prenatal visits Maternal Vaccinations:TDap Other Comments:  T2DM on insulin , CHTN on 3 meds   No results found for this or any previous visit (from the past 24 hours).  Patient Active Problem List   Diagnosis Date Noted   Type 2 diabetes mellitus during pregnancy 09/17/2023   Gestational diabetes mellitus (GDM) requiring insulin  06/08/2023   Poor fetal growth affecting management of mother in second trimester 05/25/2023   Alpha thalassemia silent carrier 04/08/2023   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   Supervision of high risk pregnancy, antepartum 03/17/2023   Morbid obesity (HCC) 03/17/2023   MDD (major depressive disorder), recurrent, severe, with psychosis (HCC) 08/31/2021   GAD (generalized anxiety disorder) 10/23/2020   Complication of anesthesia 09/04/2020   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   Lumbar radiculopathy 08/17/2019   Uncontrolled type 2 diabetes mellitus with hyperglycemia (HCC) 01/18/2019   Asthma 06/08/2018   Umbilical hernia without obstruction and without gangrene 02/25/2017    Obstructive sleep apnea 12/11/2016   Chronic hypertension in pregnancy 01/23/2016   Depression, recurrent (HCC) 01/05/2009   Severe obesity (BMI >= 40) (HCC) 03/26/2006    ASSESSMENT & PLAN Laura Benson is 33 y.o. G1P0 with IUP at [redacted]w[redacted]d 10/10/2023. Hx anesthesia  complication - Anesthesia consult  #Labor: IOL. Start w/ cytotec  then foley, Pitocin , AROM PRN.  #Pain: epidural PRN #FWB: Cat I  #GBS status:  negative #Feeding: Breastmilk  and Formula #Reproductive Life planning: Not Discussed #Circ:  yes   Laura Benson  Laura Benson 09/17/2023 5:08 PM

## 2023-09-17 NOTE — Progress Notes (Signed)
 Labor Progress Note Chaeli Judy is a 33 y.o. G1P0 at [redacted]w[redacted]d presenting for IOL  S: Introduced myself to the patient. Discussed foley balloon, which she consented to. Desires pain medication prior. Discussed epidural and different experience of unmedicated birth when going into labor naturally vs induction and levels of pain. Discussed risk of requiring general anesthesia in case of cesarean due to BMI/pregnancy/hx of difficulty with prior general anesthesia x2. She would like anxiolytic prior to placement if needed.  O:  BP (!) 148/84   Pulse 68   Temp 98 F (36.7 C) (Oral)   Resp 17   Ht 5' 6 (1.676 m)   Wt (!) 193.6 kg   LMP 01/03/2023   BMI 68.89 kg/m  Lab Results  Component Value Date   HGB 10.8 (L) 09/17/2023    Time: 9:50 PM  FHT: baseline bpm 130, moderate variability, accelerations present, decelerations none,   Contractions: q 3-5 mins,   Cook cather placed, filled to 60ml CVE: Dilation: Fingertip Effacement (%): Thick Station: -3 Presentation: Vertex Exam by:: Valentin Ming RN   A&P: 33 y.o. G1P0 [redacted]w[redacted]d IOL for uncontrolled cHTN, T2DM #Labor: Latent Labor Cytotec  and IP Foley  #Pain: IV pain meds #FWB: Category I #GBS negative BMI 68, anxiety (PRN ativan  ordered), hx anesthesia complications (chronic/other problems)  Leeroy KATHEE Pouch, MD 9:50 PM

## 2023-09-17 NOTE — Inpatient Diabetes Management (Signed)
 Inpatient Diabetes Program Recommendations  AACE/ADA: New Consensus Statement on Inpatient Glycemic Control (2015)  Target Ranges:  Prepandial:   less than 140 mg/dL      Peak postprandial:   less than 180 mg/dL (1-2 hours)      Critically ill patients:  140 - 180 mg/dL   Lab Results  Component Value Date   GLUCAP 124 (H) 04/18/2020   HGBA1C 6.2 (H) 03/24/2023    Review of Glycemic Control  Diabetes history: Pre-DM Outpatient Diabetes medications: Omnipod insulin  pump with the Dexcom G7.   Basal-0.5 units/hr ICR-1:11 CHO's + 3 units for sweets ICF-70 Target-110 Current orders for Inpatient glycemic control: None  Inpatient Diabetes Program Recommendations:    Current Dexcom readings are consistently > 140 mg/dL this morning.  Please start IV insulin  for IOL.    Discussed plan with patient and need for IV insulin .  She will remove her insulin  pump once IV insulin  is started.  Discussed hormonal shifts once placenta is delivered; will not likely require DM medications postpartum.    Thank you, Wyvonna Pinal, MSN, CDCES Diabetes Coordinator Inpatient Diabetes Program [phone] (team pager from 8a-5p)

## 2023-09-18 ENCOUNTER — Inpatient Hospital Stay (HOSPITAL_COMMUNITY): Payer: Self-pay | Admitting: Anesthesiology

## 2023-09-18 ENCOUNTER — Ambulatory Visit

## 2023-09-18 ENCOUNTER — Encounter: Admitting: Family Medicine

## 2023-09-18 LAB — CBC
HCT: 36.8 % (ref 36.0–46.0)
HCT: 37.5 % (ref 36.0–46.0)
Hemoglobin: 11.6 g/dL — ABNORMAL LOW (ref 12.0–15.0)
Hemoglobin: 11.7 g/dL — ABNORMAL LOW (ref 12.0–15.0)
MCH: 23.8 pg — ABNORMAL LOW (ref 26.0–34.0)
MCH: 23.8 pg — ABNORMAL LOW (ref 26.0–34.0)
MCHC: 31.5 g/dL (ref 30.0–36.0)
MCHC: 31.2 g/dL (ref 30.0–36.0)
MCV: 75.4 fL — ABNORMAL LOW (ref 80.0–100.0)
MCV: 76.2 fL — ABNORMAL LOW (ref 80.0–100.0)
Platelets: 235 K/uL (ref 150–400)
Platelets: 245 K/uL (ref 150–400)
RBC: 4.88 MIL/uL (ref 3.87–5.11)
RBC: 4.92 MIL/uL (ref 3.87–5.11)
RDW: 14.7 % (ref 11.5–15.5)
RDW: 14.6 % (ref 11.5–15.5)
WBC: 7.3 K/uL (ref 4.0–10.5)
WBC: 7 K/uL (ref 4.0–10.5)
nRBC: 0 % (ref 0.0–0.2)
nRBC: 0 % (ref 0.0–0.2)

## 2023-09-18 LAB — GLUCOSE, CAPILLARY: Glucose-Capillary: 110 mg/dL — ABNORMAL HIGH (ref 70–99)

## 2023-09-18 LAB — COMPREHENSIVE METABOLIC PANEL WITH GFR
ALT: 15 U/L (ref 0–44)
AST: 20 U/L (ref 15–41)
Albumin: 2.8 g/dL — ABNORMAL LOW (ref 3.5–5.0)
Alkaline Phosphatase: 98 U/L (ref 38–126)
Anion gap: 7 (ref 5–15)
BUN: 8 mg/dL (ref 6–20)
CO2: 21 mmol/L — ABNORMAL LOW (ref 22–32)
Calcium: 8.9 mg/dL (ref 8.9–10.3)
Chloride: 106 mmol/L (ref 98–111)
Creatinine, Ser: 0.96 mg/dL (ref 0.44–1.00)
GFR, Estimated: >60 mL/min (ref >60)
Glucose, Bld: 85 mg/dL (ref 70–99)
Potassium: 3.7 mmol/L (ref 3.5–5.1)
Sodium: 134 mmol/L — ABNORMAL LOW (ref 135–145)
Total Bilirubin: 0.9 mg/dL (ref 0.0–1.2)
Total Protein: 6.4 g/dL — ABNORMAL LOW (ref 6.5–8.1)

## 2023-09-18 LAB — RPR: RPR Ser Ql: NONREACTIVE

## 2023-09-18 MED ORDER — SODIUM CHLORIDE 0.9 % IV SOLN
INTRAVENOUS | Status: DC | PRN
  Administered 2023-09-18: 8 mL via EPIDURAL

## 2023-09-18 MED ORDER — TERBUTALINE SULFATE 1 MG/ML IJ SOLN: 0.2500 mg | Freq: Once | INTRAMUSCULAR | Status: DC | PRN

## 2023-09-18 MED ORDER — MAGNESIUM SULFATE 40 GM/1000ML IV SOLN
2.0000 g/h | INTRAVENOUS | Status: DC
  Administered 2023-09-18: 2 g/h via INTRAVENOUS
  Filled 2023-09-18: qty 1000

## 2023-09-18 MED ORDER — HYDRALAZINE HCL 20 MG/ML IJ SOLN: 10.0000 mg | INTRAMUSCULAR | Status: DC | PRN

## 2023-09-18 MED ORDER — DEXTROSE 50 % IV SOLN: 0.0000 mL | INTRAVENOUS | Status: DC | PRN

## 2023-09-18 MED ORDER — LABETALOL HCL 5 MG/ML IV SOLN: 80.0000 mg | INTRAVENOUS | Status: DC | PRN

## 2023-09-18 MED ORDER — INSULIN REGULAR(HUMAN) IN NACL 100-0.9 UT/100ML-% IV SOLN
INTRAVENOUS | Status: DC
  Administered 2023-09-18: 4.8 [IU]/h via INTRAVENOUS
  Filled 2023-09-18 (×2): qty 100

## 2023-09-18 MED ORDER — LIDOCAINE-EPINEPHRINE (PF) 2 %-1:200000 IJ SOLN
INTRAMUSCULAR | Status: DC | PRN
  Administered 2023-09-18: 3 mL via EPIDURAL
  Administered 2023-09-19 (×4): 5 mL via EPIDURAL

## 2023-09-18 MED ORDER — LACTATED RINGERS IV SOLN: INTRAVENOUS | Status: DC

## 2023-09-18 MED ORDER — CYCLOBENZAPRINE HCL 10 MG PO TABS
10.0000 mg | ORAL_TABLET | Freq: Three times a day (TID) | ORAL | Status: DC | PRN
  Administered 2023-09-19: 10 mg via ORAL
  Filled 2023-09-18: qty 2

## 2023-09-18 MED ORDER — OXYTOCIN-SODIUM CHLORIDE 30-0.9 UT/500ML-% IV SOLN
1.0000 m[IU]/min | INTRAVENOUS | Status: DC
  Administered 2023-09-18: 2 m[IU]/min via INTRAVENOUS

## 2023-09-18 MED ORDER — LIDOCAINE HCL (PF) 1 % IJ SOLN
INTRAMUSCULAR | Status: DC | PRN
  Administered 2023-09-18: 3 mL via SUBCUTANEOUS

## 2023-09-18 MED ORDER — LACTATED RINGERS IV SOLN: INTRAVENOUS | Status: AC

## 2023-09-18 MED ORDER — LABETALOL HCL 5 MG/ML IV SOLN
20.0000 mg | INTRAVENOUS | Status: DC | PRN
  Administered 2023-09-19: 20 mg via INTRAVENOUS
  Filled 2023-09-18: qty 4

## 2023-09-18 MED ORDER — LABETALOL HCL 5 MG/ML IV SOLN
40.0000 mg | INTRAVENOUS | Status: DC | PRN
Start: 2023-09-18 — End: 2023-09-26

## 2023-09-18 MED ORDER — DEXTROSE IN LACTATED RINGERS 5 % IV SOLN: INTRAVENOUS | Status: DC

## 2023-09-18 MED ORDER — MAGNESIUM SULFATE BOLUS VIA INFUSION
4.0000 g | Freq: Once | INTRAVENOUS | Status: AC
  Administered 2023-09-18: 4 g via INTRAVENOUS
  Filled 2023-09-18: qty 1000

## 2023-09-18 NOTE — Progress Notes (Signed)
 Labor Progress Note Laura Benson is a 33 y.o. G1P0 at [redacted]w[redacted]d presented for IOL due to SIPE T2DM, obesity.  S: Doing well, resting comfortably.   O:  BP (!) 159/94   Pulse 84   Temp 98.3 F (36.8 C) (Oral)   Resp 17   Ht 5' 6 (1.676 m)   Wt (!) 193.6 kg   LMP 01/03/2023   SpO2 93%   BMI 68.89 kg/m  EFM: 120/moderate variability/accels absent, one late deceleration while in the room, and occasional variables.   CVE: Dilation: 4.5 Effacement (%): 60 Cervical Position: Posterior Station: -2 Presentation: Vertex Exam by:: Dr. Magali   A&P: 33 y.o. G1P0 [redacted]w[redacted]d  #Labor: Cervix unchanged from previous. IUPC in place. Contractions inadequate. Pitocin  was turned off due to recurrent late decelerations at 1930. Will restart pitocin  1x1.  #Pain: Epidural in place #FWB: Cat II tracing #GBS negative #T2DM: Last BGL 85 #SIPE: Last BP 159/94, continue antihypertensive regimen.   Laura Blades LITTIE Magali, MD 9:49 PM

## 2023-09-18 NOTE — Progress Notes (Signed)
 Labor Progress Note Laura Benson is a 33 y.o. G1P0 at [redacted]w[redacted]d presented for IOL 2/2 uncontrolled cHNT and adequately controlled DM2 on insulin  pump S:   O:  BP (!) 151/82   Pulse 80   Temp 98.2 F (36.8 C) (Oral)   Resp 18   Ht 5' 6 (1.676 m)   Wt (!) 193.6 kg   LMP 01/03/2023   BMI 68.89 kg/m  EFM: 125 baseline/moderate variability/+ accels, no decels  CVE: Dilation: 3 Effacement (%): 60 Station: Ballotable Presentation: Vertex (confirmed with bedside ultrasound) Exam by:: Nicholaus MD US : vertex   A&P: 33 y.o. G1P0 [redacted]w[redacted]d for IOL #Labor: Progressing well. S/p cytotecx4, now starting pitocin . Consider AROM in 2-4 hours #Pain: well controlled #FWB: Cat 1 #GBS negative #cHTN: Mild range pressures on home regimen. No s/sx of preE currently #T2DM: CBGs to goal so far. Anticipate endotool during active labor  Coolidge Moros, MD 10:32 AM

## 2023-09-18 NOTE — Anesthesia Procedure Notes (Signed)
 Epidural Patient location during procedure: OB Start time: 09/18/2023 2:15 PM End time: 09/18/2023 2:30 PM  Staffing Anesthesiologist: Boone Fess, MD Performed: anesthesiologist   Preanesthetic Checklist Completed: patient identified, IV checked, site marked, risks and benefits discussed, surgical consent, monitors and equipment checked, pre-op evaluation and timeout performed  Epidural Patient position: sitting Prep: ChloraPrep Patient monitoring: heart rate, continuous pulse ox and blood pressure Approach: midline Location: L3-L4 Injection technique: LOR saline  Needle:  Needle type: Tuohy  Needle gauge: 17 G Needle length: 15 cm Needle insertion depth: 10 cm Catheter type: closed end flexible Catheter size: 19 Gauge Catheter at skin depth: 15 cm Test dose: negative and 1.5% lidocaine  with Epi 1:200 K  Assessment Sensory level: T10 Events: blood not aspirated, no cerebrospinal fluid, injection not painful, no injection resistance, no paresthesia and negative IV test  Additional Notes First/one attempt. Long 15cm Tuohy utilized. Pt. Evaluated and documentation done after procedure finished. Patient identified. Risks/Benefits/Options discussed with patient including but not limited to bleeding, infection, nerve damage, paralysis, failed block, incomplete pain control, headache, blood pressure changes, nausea, vomiting, reactions to medication both or allergic, itching and postpartum back pain. Confirmed with bedside nurse the patient's most recent platelet count. Confirmed with patient that they are not currently taking any anticoagulation, have any bleeding history or any family history of bleeding disorders. Patient expressed understanding and wished to proceed. All questions were answered. Sterile technique was used throughout the entire procedure. Please see nursing notes for vital signs. Test dose was given through epidural catheter and negative prior to continuing to dose  epidural or start infusion. Warning signs of high block given to the patient including shortness of breath, tingling/numbness in hands, complete motor block, or any concerning symptoms with instructions to call for help. Patient was given instructions on fall risk and not to get out of bed. All questions and concerns addressed with instructions to call with any issues or inadequate analgesia.     Patient tolerated the insertion well without immediate complications.  Reason for block: procedure for painReason for block:procedure for pain

## 2023-09-18 NOTE — Progress Notes (Signed)
 LABOR PROGRESS NOTE  Patient Name: Tyrianna Lightle, female   DOB: 10-Feb-1990, 33 y.o.  MRN: [id number]  To bedside to meet patient. Feeling painful contractions 3-5 apart. FB recently dislodged. Last cytotec  0630. Amenable to check. Requesting fentanyl  prior. Cervix 3/60/ballotable. Very posterior, and babe not engaged. Discussed upright positioning, and stating pitocin  at 1030. Will reassess for AROM once pitocin  going 2-4 hours. Cat I.  #cHTN: Mild range pressures on home regimen. No s/sx of preE currently #T2DM: CBGs to goal so far. Anticipate endotool during active labor  Almarie CHRISTELLA Moats, MD

## 2023-09-18 NOTE — Inpatient Diabetes Management (Signed)
 Inpatient Diabetes Program Recommendations  Diabetes Treatment Program Recommendations  ADA Standards of Care Diabetes in Pregnancy Target Glucose Ranges:  Fasting: 70 - 95 mg/dL 1 hr postprandial: Less than 140mg /dL (from first bite of meal) 2 hr postprandial: Less than 120 mg/dL (from first bite of meal)    Lab Results  Component Value Date   GLUCAP 110 (H) 09/18/2023   HGBA1C 6.2 (H) 03/24/2023    Review of Glycemic Control  Latest Reference Range & Units 09/17/23 14:15 09/18/23 02:20  Glucose-Capillary 70 - 99 mg/dL 98 889 (H)  (H): Data is abnormally high Diabetes history: Type 2 DM (dx 2020; hx of gastric sleeve 2022) Outpatient Diabetes medications: Omnipod 0.5 units/hr Prior to Pregnancy: Ozempic  Qwk Lantus  16 units every day (prior to gastric sleeve) Current orders for Inpatient glycemic control: insulin  pump  Inpatient Diabetes Program Recommendations:    Following CBGs during labor. Appropriate at this time.  Could consider IV insulin  via endotool when in active labor.   Following delivery: -Novolog  0-9 units TID & HS - insulin  glargine 8 units at delivery.  Thanks, Tinnie Minus, MSN, RNC-OB Diabetes Coordinator [phone] (8a-5p)

## 2023-09-18 NOTE — Progress Notes (Signed)
 LABOR PROGRESS NOTE  Patient Name: Laura Benson, female   DOB: 1990-06-23, 33 y.o.  MRN: [id number]  Patient now comfortable s/p epidural. Is amenable to cervical check, AROM, internal monitor placement as able. Cervix 4.5/60/-2. R/B/A of AROM/internal monitor placement discussed with patient, and verbal consent obtained. Babe's head found to be well-applied. Obtained moderate clear fluid. IUPC and FSE placed. Mom and babe tolerated well. Cat I. Titrate pit PRN.  #preE w/SF: Multiple severe range pressures and development of HA. S/p mag bolus; now 2g/hr. Start labs Q12. #T2DM: On endotool  Laura CHRISTELLA Moats, MD

## 2023-09-18 NOTE — Plan of Care (Signed)
  Problem: Education: Goal: Knowledge of General Education information will improve Description: Including pain rating scale, medication(s)/side effects and non-pharmacologic comfort measures Outcome: Progressing   Problem: Health Behavior/Discharge Planning: Goal: Ability to manage health-related needs will improve Outcome: Progressing   Problem: Clinical Measurements: Goal: Ability to maintain clinical measurements within normal limits will improve Outcome: Progressing Goal: Will remain free from infection Outcome: Progressing Goal: Diagnostic test results will improve Outcome: Progressing Goal: Respiratory complications will improve Outcome: Progressing Goal: Cardiovascular complication will be avoided Outcome: Progressing   Problem: Activity: Goal: Risk for activity intolerance will decrease Outcome: Progressing   Problem: Nutrition: Goal: Adequate nutrition will be maintained Outcome: Progressing   Problem: Coping: Goal: Level of anxiety will decrease Outcome: Progressing   Problem: Elimination: Goal: Will not experience complications related to bowel motility Outcome: Progressing Goal: Will not experience complications related to urinary retention Outcome: Progressing   Problem: Pain Managment: Goal: General experience of comfort will improve and/or be controlled Outcome: Progressing   Problem: Safety: Goal: Ability to remain free from injury will improve Outcome: Progressing   Problem: Skin Integrity: Goal: Risk for impaired skin integrity will decrease Outcome: Progressing   Problem: Education: Goal: Knowledge of Childbirth will improve Outcome: Progressing Goal: Ability to make informed decisions regarding treatment and plan of care will improve Outcome: Progressing Goal: Ability to state and carry out methods to decrease the pain will improve Outcome: Progressing Goal: Individualized Educational Video(s) Outcome: Progressing   Problem:  Coping: Goal: Ability to verbalize concerns and feelings about labor and delivery will improve Outcome: Progressing   Problem: Life Cycle: Goal: Ability to make normal progression through stages of labor will improve Outcome: Progressing Goal: Ability to effectively push during vaginal delivery will improve Outcome: Progressing   Problem: Role Relationship: Goal: Will demonstrate positive interactions with the child Outcome: Progressing   Problem: Safety: Goal: Risk of complications during labor and delivery will decrease Outcome: Progressing   Problem: Pain Management: Goal: Relief or control of pain from uterine contractions will improve Outcome: Progressing   Problem: Education: Goal: Ability to describe self-care measures that may prevent or decrease complications (Diabetes Survival Skills Education) will improve Outcome: Progressing Goal: Individualized Educational Video(s) Outcome: Progressing   Problem: Coping: Goal: Ability to adjust to condition or change in health will improve Outcome: Progressing   Problem: Fluid Volume: Goal: Ability to maintain a balanced intake and output will improve Outcome: Progressing   Problem: Health Behavior/Discharge Planning: Goal: Ability to identify and utilize available resources and services will improve Outcome: Progressing Goal: Ability to manage health-related needs will improve Outcome: Progressing   Problem: Metabolic: Goal: Ability to maintain appropriate glucose levels will improve Outcome: Progressing   Problem: Nutritional: Goal: Maintenance of adequate nutrition will improve Outcome: Progressing Goal: Progress toward achieving an optimal weight will improve Outcome: Progressing   Problem: Skin Integrity: Goal: Risk for impaired skin integrity will decrease Outcome: Progressing   Problem: Tissue Perfusion: Goal: Adequacy of tissue perfusion will improve Outcome: Progressing   Problem: Education: Goal:  Knowledge of disease or condition will improve Outcome: Progressing Goal: Knowledge of the prescribed therapeutic regimen will improve Outcome: Progressing   Problem: Fluid Volume: Goal: Peripheral tissue perfusion will improve Outcome: Progressing   Problem: Clinical Measurements: Goal: Complications related to disease process, condition or treatment will be avoided or minimized Outcome: Progressing

## 2023-09-18 NOTE — Progress Notes (Signed)
 LABOR PROGRESS NOTE  Patient Name: Laura Benson, female   DOB: Jul 11, 1990, 33 y.o.  MRN: [id number]  Called by RN for severe range blood pressure. No having any symptoms of preE otherwise. She also may have SROM'ed and is feeling significantly more pain. Recommended epidural to see if pain is causing the rise to severe pressures. If remains severe after comfortable, would start mag.  Started endotool as patient's pump fell off and she does not have supplies here to replace.  Cat I.  Laura CHRISTELLA Moats, MD

## 2023-09-19 ENCOUNTER — Inpatient Hospital Stay (HOSPITAL_COMMUNITY)

## 2023-09-19 ENCOUNTER — Encounter (HOSPITAL_COMMUNITY)
Admission: RE | Disposition: A | Discharge status: Home / Self Care | Payer: Self-pay | Source: Home / Self Care | Attending: Obstetrics and Gynecology

## 2023-09-19 ENCOUNTER — Encounter (HOSPITAL_COMMUNITY): Payer: Self-pay | Admitting: Family Medicine

## 2023-09-19 ENCOUNTER — Other Ambulatory Visit: Payer: Self-pay

## 2023-09-19 DIAGNOSIS — G4733 Obstructive sleep apnea (adult) (pediatric): Secondary | ICD-10-CM

## 2023-09-19 DIAGNOSIS — I10 Essential (primary) hypertension: Secondary | ICD-10-CM

## 2023-09-19 DIAGNOSIS — O99214 Obesity complicating childbirth: Secondary | ICD-10-CM

## 2023-09-19 DIAGNOSIS — Z3A36 36 weeks gestation of pregnancy: Secondary | ICD-10-CM

## 2023-09-19 DIAGNOSIS — E119 Type 2 diabetes mellitus without complications: Secondary | ICD-10-CM | POA: Diagnosis not present

## 2023-09-19 DIAGNOSIS — J9601 Acute respiratory failure with hypoxia: Secondary | ICD-10-CM

## 2023-09-19 DIAGNOSIS — O24414 Gestational diabetes mellitus in pregnancy, insulin controlled: Secondary | ICD-10-CM

## 2023-09-19 DIAGNOSIS — O1414 Severe pre-eclampsia complicating childbirth: Secondary | ICD-10-CM

## 2023-09-19 DIAGNOSIS — R0603 Acute respiratory distress: Secondary | ICD-10-CM

## 2023-09-19 DIAGNOSIS — Z3A37 37 weeks gestation of pregnancy: Secondary | ICD-10-CM

## 2023-09-19 DIAGNOSIS — O365930 Maternal care for other known or suspected poor fetal growth, third trimester, not applicable or unspecified: Secondary | ICD-10-CM

## 2023-09-19 HISTORY — PX: INTRAUTERINE DEVICE (IUD) INSERTION: SHX5877

## 2023-09-19 LAB — COMPREHENSIVE METABOLIC PANEL WITH GFR
ALT: 15 U/L (ref 0–44)
ALT: 12 U/L (ref 0–44)
ALT: UNDETERMINED U/L (ref 0–44)
ALT: 14 U/L (ref 0–44)
ALT: 14 U/L (ref 0–44)
AST: 20 U/L (ref 15–41)
AST: 20 U/L (ref 15–41)
AST: UNDETERMINED U/L (ref 15–41)
AST: 26 U/L (ref 15–41)
AST: 27 U/L (ref 15–41)
Albumin: 2.6 g/dL — ABNORMAL LOW (ref 3.5–5.0)
Albumin: 2.7 g/dL — ABNORMAL LOW (ref 3.5–5.0)
Albumin: 2.6 g/dL — ABNORMAL LOW (ref 3.5–5.0)
Albumin: 2.2 g/dL — ABNORMAL LOW (ref 3.5–5.0)
Albumin: 2.3 g/dL — ABNORMAL LOW (ref 3.5–5.0)
Alkaline Phosphatase: 99 U/L (ref 38–126)
Alkaline Phosphatase: 104 U/L (ref 38–126)
Alkaline Phosphatase: UNDETERMINED U/L (ref 38–126)
Alkaline Phosphatase: 84 U/L (ref 38–126)
Alkaline Phosphatase: 85 U/L (ref 38–126)
Anion gap: 7 (ref 5–15)
Anion gap: 8 (ref 5–15)
Anion gap: UNDETERMINED (ref 5–15)
Anion gap: 9 (ref 5–15)
Anion gap: 10 (ref 5–15)
BUN: 7 mg/dL (ref 6–20)
BUN: 7 mg/dL (ref 6–20)
BUN: 8 mg/dL (ref 6–20)
BUN: 8 mg/dL (ref 6–20)
BUN: 8 mg/dL (ref 6–20)
CO2: 20 mmol/L — ABNORMAL LOW (ref 22–32)
CO2: 19 mmol/L — ABNORMAL LOW (ref 22–32)
CO2: UNDETERMINED mmol/L (ref 22–32)
CO2: 20 mmol/L — ABNORMAL LOW (ref 22–32)
CO2: 20 mmol/L — ABNORMAL LOW (ref 22–32)
Calcium: 8.2 mg/dL — ABNORMAL LOW (ref 8.9–10.3)
Calcium: 8.5 mg/dL — ABNORMAL LOW (ref 8.9–10.3)
Calcium: UNDETERMINED mg/dL (ref 8.9–10.3)
Calcium: 7.9 mg/dL — ABNORMAL LOW (ref 8.9–10.3)
Calcium: 8 mg/dL — ABNORMAL LOW (ref 8.9–10.3)
Chloride: 107 mmol/L (ref 98–111)
Chloride: 106 mmol/L (ref 98–111)
Chloride: UNDETERMINED mmol/L (ref 98–111)
Chloride: 105 mmol/L (ref 98–111)
Chloride: 105 mmol/L (ref 98–111)
Creatinine, Ser: 1 mg/dL (ref 0.44–1.00)
Creatinine, Ser: 1.04 mg/dL — ABNORMAL HIGH (ref 0.44–1.00)
Creatinine, Ser: 1.25 mg/dL — ABNORMAL HIGH (ref 0.44–1.00)
Creatinine, Ser: 1.33 mg/dL — ABNORMAL HIGH (ref 0.44–1.00)
Creatinine, Ser: 1.32 mg/dL — ABNORMAL HIGH (ref 0.44–1.00)
GFR, Estimated: >60 mL/min (ref >60)
GFR, Estimated: >60 mL/min (ref >60)
GFR, Estimated: 58 mL/min — ABNORMAL LOW (ref >60)
GFR, Estimated: 54 mL/min — ABNORMAL LOW (ref >60)
GFR, Estimated: 55 mL/min — ABNORMAL LOW (ref >60)
Glucose, Bld: 100 mg/dL — ABNORMAL HIGH (ref 70–99)
Glucose, Bld: 89 mg/dL (ref 70–99)
Glucose, Bld: 122 mg/dL — ABNORMAL HIGH (ref 70–99)
Glucose, Bld: 130 mg/dL — ABNORMAL HIGH (ref 70–99)
Glucose, Bld: 124 mg/dL — ABNORMAL HIGH (ref 70–99)
Potassium: 4 mmol/L (ref 3.5–5.1)
Potassium: 3.9 mmol/L (ref 3.5–5.1)
Potassium: UNDETERMINED mmol/L (ref 3.5–5.1)
Potassium: 4.3 mmol/L (ref 3.5–5.1)
Potassium: 4.3 mmol/L (ref 3.5–5.1)
Sodium: 134 mmol/L — ABNORMAL LOW (ref 135–145)
Sodium: 133 mmol/L — ABNORMAL LOW (ref 135–145)
Sodium: UNDETERMINED mmol/L (ref 135–145)
Sodium: 134 mmol/L — ABNORMAL LOW (ref 135–145)
Sodium: 135 mmol/L (ref 135–145)
Total Bilirubin: 0.6 mg/dL (ref 0.0–1.2)
Total Bilirubin: 0.8 mg/dL (ref 0.0–1.2)
Total Bilirubin: 0 mg/dL (ref 0.0–1.2)
Total Bilirubin: 0.4 mg/dL (ref 0.0–1.2)
Total Bilirubin: 0.7 mg/dL (ref 0.0–1.2)
Total Protein: 6.3 g/dL — ABNORMAL LOW (ref 6.5–8.1)
Total Protein: 6.5 g/dL (ref 6.5–8.1)
Total Protein: 5.7 g/dL — ABNORMAL LOW (ref 6.5–8.1)
Total Protein: 5.5 g/dL — ABNORMAL LOW (ref 6.5–8.1)
Total Protein: 5.5 g/dL — ABNORMAL LOW (ref 6.5–8.1)

## 2023-09-19 LAB — CBC
HCT: 37.7 % (ref 36.0–46.0)
Hemoglobin: 12 g/dL (ref 12.0–15.0)
MCH: 24.1 pg — ABNORMAL LOW (ref 26.0–34.0)
MCHC: 31.8 g/dL (ref 30.0–36.0)
MCV: 75.9 fL — ABNORMAL LOW (ref 80.0–100.0)
Platelets: 249 K/uL (ref 150–400)
RBC: 4.97 MIL/uL (ref 3.87–5.11)
RDW: 14.7 % (ref 11.5–15.5)
WBC: 8.5 K/uL (ref 4.0–10.5)
nRBC: 0 % (ref 0.0–0.2)

## 2023-09-19 LAB — DIC (DISSEMINATED INTRAVASCULAR COAGULATION)PANEL
D-Dimer, Quant: 2.56 ug{FEU}/mL — ABNORMAL HIGH (ref 0.00–0.50)
Fibrinogen: 621 mg/dL — ABNORMAL HIGH (ref 210–475)
INR: 1.1 (ref 0.8–1.2)
Platelets: 164 K/uL (ref 150–400)
Prothrombin Time: 14.4 s (ref 11.4–15.2)
Smear Review: NONE SEEN
aPTT: 22 s — ABNORMAL LOW (ref 24–36)

## 2023-09-19 LAB — PREPARE RBC (CROSSMATCH)

## 2023-09-19 LAB — MRSA NEXT GEN BY PCR, NASAL: MRSA by PCR Next Gen: NOT DETECTED

## 2023-09-19 LAB — GLUCOSE, CAPILLARY
Glucose-Capillary: 100 mg/dL — ABNORMAL HIGH (ref 70–99)
Glucose-Capillary: 105 mg/dL — ABNORMAL HIGH (ref 70–99)
Glucose-Capillary: 113 mg/dL — ABNORMAL HIGH (ref 70–99)
Glucose-Capillary: 113 mg/dL — ABNORMAL HIGH (ref 70–99)
Glucose-Capillary: 127 mg/dL — ABNORMAL HIGH (ref 70–99)
Glucose-Capillary: 170 mg/dL — ABNORMAL HIGH (ref 70–99)

## 2023-09-19 LAB — PROCALCITONIN: Procalcitonin: 0.24 ng/mL

## 2023-09-19 LAB — MAGNESIUM: Magnesium: 4.4 mg/dL — ABNORMAL HIGH (ref 1.7–2.4)

## 2023-09-19 SURGERY — Surgical Case
Anesthesia: Epidural | Site: Abdomen

## 2023-09-19 MED ORDER — FENTANYL CITRATE (PF) 250 MCG/5ML IJ SOLN
INTRAMUSCULAR | Status: AC
Start: 2023-09-19 — End: 2023-09-19
  Filled 2023-09-19: qty 5

## 2023-09-19 MED ORDER — SOD CITRATE-CITRIC ACID 500-334 MG/5ML PO SOLN
30.0000 mL | ORAL | Status: AC
  Administered 2023-09-19: 30 mL via ORAL

## 2023-09-19 MED ORDER — STERILE WATER FOR IRRIGATION IR SOLN
Status: DC | PRN
  Administered 2023-09-19: 1000 mL

## 2023-09-19 MED ORDER — MISOPROSTOL 200 MCG PO TABS
1000.0000 ug | ORAL_TABLET | Freq: Once | ORAL | Status: AC
  Filled 2023-09-19: qty 5

## 2023-09-19 MED ORDER — OXYTOCIN-SODIUM CHLORIDE 30-0.9 UT/500ML-% IV SOLN
INTRAVENOUS | Status: DC | PRN
Start: 2023-09-19 — End: 2023-09-19
  Administered 2023-09-19: 300 mL via INTRAVENOUS

## 2023-09-19 MED ORDER — FENTANYL CITRATE PF 50 MCG/ML IJ SOSY: 50.0000 ug | PREFILLED_SYRINGE | INTRAMUSCULAR | Status: DC | PRN

## 2023-09-19 MED ORDER — FENTANYL CITRATE (PF) 100 MCG/2ML IJ SOLN
INTRAMUSCULAR | Status: DC | PRN
  Administered 2023-09-19: 100 ug via EPIDURAL

## 2023-09-19 MED ORDER — FUROSEMIDE 10 MG/ML IJ SOLN
40.0000 mg | Freq: Once | INTRAMUSCULAR | Status: AC
  Administered 2023-09-19: 40 mg via INTRAVENOUS
  Filled 2023-09-19: qty 4

## 2023-09-19 MED ORDER — LIDOCAINE HCL (PF) 2 % IJ SOLN
INTRAMUSCULAR | Status: DC | PRN
Start: 2023-09-19 — End: 2023-09-19
  Administered 2023-09-19: 100 mg via INTRADERMAL

## 2023-09-19 MED ORDER — INSULIN ASPART 100 UNIT/ML IJ SOLN
0.0000 [IU] | INTRAMUSCULAR | Status: DC
  Administered 2023-09-19: 3 [IU] via SUBCUTANEOUS
  Administered 2023-09-19: 4 [IU] via SUBCUTANEOUS
  Administered 2023-09-21: 3 [IU] via SUBCUTANEOUS

## 2023-09-19 MED ORDER — HYDRALAZINE HCL 20 MG/ML IJ SOLN: 10.0000 mg | Freq: Three times a day (TID) | INTRAMUSCULAR | Status: DC

## 2023-09-19 MED ORDER — ONDANSETRON HCL 4 MG/2ML IJ SOLN
INTRAMUSCULAR | Status: AC
  Filled 2023-09-19: qty 4

## 2023-09-19 MED ORDER — PROPOFOL 1000 MG/100ML IV EMUL
INTRAVENOUS | Status: AC
Start: 2023-09-19 — End: 2023-09-19
  Administered 2023-09-19: 50 ug/kg/min via INTRAVENOUS
  Filled 2023-09-19: qty 100

## 2023-09-19 MED ORDER — ROCURONIUM 10MG/ML (10ML) SYRINGE FOR MEDFUSION PUMP - OPTIME
INTRAVENOUS | Status: DC | PRN
Start: 2023-09-19 — End: 2023-09-19
  Administered 2023-09-19: 30 mg via INTRAVENOUS
  Administered 2023-09-19: 40 mg via INTRAVENOUS

## 2023-09-19 MED ORDER — LIDOCAINE-EPINEPHRINE (PF) 2 %-1:200000 IJ SOLN
INTRAMUSCULAR | Status: AC
  Filled 2023-09-19: qty 20

## 2023-09-19 MED ORDER — DEXTROSE 5 % IV SOLN
INTRAVENOUS | Status: DC | PRN
  Administered 2023-09-19: 3 g via INTRAVENOUS

## 2023-09-19 MED ORDER — GENTAMICIN SULFATE 40 MG/ML IJ SOLN
5.0000 mg/kg | Freq: Once | INTRAVENOUS | Status: AC
  Administered 2023-09-19: 570 mg via INTRAVENOUS
  Filled 2023-09-19: qty 14.25

## 2023-09-19 MED ORDER — INSULIN GLARGINE 100 UNIT/ML ~~LOC~~ SOLN
5.0000 [IU] | Freq: Every day | SUBCUTANEOUS | Status: DC
  Administered 2023-09-19 – 2023-09-20 (×2): 5 [IU] via SUBCUTANEOUS
  Filled 2023-09-19 (×3): qty 0.05

## 2023-09-19 MED ORDER — ACETAMINOPHEN 10 MG/ML IV SOLN
INTRAVENOUS | Status: AC
  Filled 2023-09-19: qty 100

## 2023-09-19 MED ORDER — LEVONORGESTREL 20 MCG/DAY IU IUD
INTRAUTERINE_SYSTEM | INTRAUTERINE | Status: AC
  Filled 2023-09-19: qty 1

## 2023-09-19 MED ORDER — CHLORHEXIDINE GLUCONATE CLOTH 2 % EX PADS
6.0000 | MEDICATED_PAD | Freq: Every day | CUTANEOUS | Status: DC
  Administered 2023-09-19 – 2023-09-20 (×2): 6 via TOPICAL

## 2023-09-19 MED ORDER — POLYETHYLENE GLYCOL 3350 17 G PO PACK: 17.0000 g | PACK | Freq: Every day | ORAL | Status: DC

## 2023-09-19 MED ORDER — MORPHINE SULFATE (PF) 0.5 MG/ML IJ SOLN
INTRAMUSCULAR | Status: AC
  Filled 2023-09-19: qty 10

## 2023-09-19 MED ORDER — INSULIN ASPART 100 UNIT/ML IJ SOLN: 0.0000 [IU] | INTRAMUSCULAR | Status: DC

## 2023-09-19 MED ORDER — OXYTOCIN-SODIUM CHLORIDE 30-0.9 UT/500ML-% IV SOLN
INTRAVENOUS | Status: AC
  Filled 2023-09-19: qty 500

## 2023-09-19 MED ORDER — ONDANSETRON HCL 4 MG/2ML IJ SOLN
INTRAMUSCULAR | Status: DC | PRN
  Administered 2023-09-19: 4 mg via INTRAVENOUS

## 2023-09-19 MED ORDER — PANTOPRAZOLE SODIUM 40 MG IV SOLR
40.0000 mg | Freq: Every day | INTRAVENOUS | Status: DC
  Administered 2023-09-19 – 2023-09-20 (×2): 40 mg via INTRAVENOUS
  Filled 2023-09-19: qty 10

## 2023-09-19 MED ORDER — CLINDAMYCIN PHOSPHATE 900 MG/50ML IV SOLN
900.0000 mg | Freq: Three times a day (TID) | INTRAVENOUS | Status: AC
  Administered 2023-09-19 – 2023-09-20 (×3): 900 mg via INTRAVENOUS
  Filled 2023-09-19 (×3): qty 50

## 2023-09-19 MED ORDER — SODIUM CHLORIDE 0.9 % IV SOLN
500.0000 mg | INTRAVENOUS | Status: AC
  Administered 2023-09-19: 500 mg via INTRAVENOUS
  Filled 2023-09-19: qty 5

## 2023-09-19 MED ORDER — PROPOFOL 10 MG/ML IV BOLUS
INTRAVENOUS | Status: DC | PRN
Start: 2023-09-19 — End: 2023-09-19
  Administered 2023-09-19: 200 mg via INTRAVENOUS

## 2023-09-19 MED ORDER — SODIUM CHLORIDE 0.9 % IR SOLN
Status: DC | PRN
  Administered 2023-09-19: 400 mL

## 2023-09-19 MED ORDER — OXYTOCIN-SODIUM CHLORIDE 30-0.9 UT/500ML-% IV SOLN
10.0000 [IU]/h | INTRAVENOUS | Status: AC
  Administered 2023-09-19 (×2): 10 [IU]/h via INTRAVENOUS
  Filled 2023-09-19: qty 500

## 2023-09-19 MED ORDER — LEVONORGESTREL 20 MCG/DAY IU IUD
1.0000 | INTRAUTERINE_SYSTEM | Freq: Once | INTRAUTERINE | Status: AC
  Administered 2023-09-19: 1 via INTRAUTERINE

## 2023-09-19 MED ORDER — METHYLERGONOVINE MALEATE 0.2 MG/ML IJ SOLN
INTRAMUSCULAR | Status: DC | PRN
Start: 2023-09-19 — End: 2023-09-19
  Administered 2023-09-19: .2 mg via INTRAMUSCULAR

## 2023-09-19 MED ORDER — METHYLERGONOVINE MALEATE 0.2 MG/ML IJ SOLN
INTRAMUSCULAR | Status: AC
  Administered 2023-09-19: 0.2 mg via INTRAMUSCULAR
  Filled 2023-09-19: qty 1

## 2023-09-19 MED ORDER — PROPOFOL 500 MG/50ML IV EMUL
INTRAVENOUS | Status: DC | PRN
  Administered 2023-09-19: 125 ug/kg/min via INTRAVENOUS

## 2023-09-19 MED ORDER — MIDAZOLAM HCL 2 MG/2ML IJ SOLN
INTRAMUSCULAR | Status: DC | PRN
  Administered 2023-09-19: 2 mg via INTRAVENOUS

## 2023-09-19 MED ORDER — DOCUSATE SODIUM 50 MG/5ML PO LIQD: 100.0000 mg | Freq: Two times a day (BID) | ORAL | Status: DC

## 2023-09-19 MED ORDER — MISOPROSTOL 200 MCG PO TABS
ORAL_TABLET | ORAL | Status: AC
  Administered 2023-09-19: 1000 ug via RECTAL
  Filled 2023-09-19: qty 5

## 2023-09-19 MED ORDER — LABETALOL HCL 5 MG/ML IV SOLN: 20.0000 mg | Freq: Three times a day (TID) | INTRAVENOUS | Status: DC

## 2023-09-19 MED ORDER — SUCCINYLCHOLINE CHLORIDE 200 MG/10ML IV SOSY
PREFILLED_SYRINGE | INTRAVENOUS | Status: DC | PRN
  Administered 2023-09-19: 200 mg via INTRAVENOUS

## 2023-09-19 MED ORDER — ORAL CARE MOUTH RINSE: 15.0000 mL | OROMUCOSAL | Status: DC | PRN

## 2023-09-19 MED ORDER — TRANEXAMIC ACID-NACL 1000-0.7 MG/100ML-% IV SOLN
INTRAVENOUS | Status: AC
  Administered 2023-09-19: 1000 mg via INTRAVENOUS
  Filled 2023-09-19: qty 100

## 2023-09-19 MED ORDER — SODIUM CHLORIDE 0.9 % IV SOLN
INTRAVENOUS | Status: AC
Start: 2023-09-19 — End: 2023-09-19
  Filled 2023-09-19: qty 5

## 2023-09-19 MED ORDER — PROPOFOL 10 MG/ML IV BOLUS
INTRAVENOUS | Status: AC
  Filled 2023-09-19: qty 60

## 2023-09-19 MED ORDER — PROPOFOL 500 MG/50ML IV EMUL
INTRAVENOUS | Status: AC
  Filled 2023-09-19: qty 100

## 2023-09-19 MED ORDER — ALBUTEROL SULFATE (2.5 MG/3ML) 0.083% IN NEBU
2.5000 mg | INHALATION_SOLUTION | RESPIRATORY_TRACT | Status: DC | PRN
  Administered 2023-09-19: 2.5 mg via RESPIRATORY_TRACT
  Filled 2023-09-19: qty 3

## 2023-09-19 MED ORDER — METHYLERGONOVINE MALEATE 0.2 MG/ML IJ SOLN: 0.2000 mg | Freq: Once | INTRAMUSCULAR | Status: AC

## 2023-09-19 MED ORDER — FENTANYL CITRATE (PF) 100 MCG/2ML IJ SOLN
INTRAMUSCULAR | Status: DC | PRN
  Administered 2023-09-19: 100 ug via INTRAVENOUS

## 2023-09-19 MED ORDER — ORAL CARE MOUTH RINSE
15.0000 mL | OROMUCOSAL | Status: DC
  Administered 2023-09-19 – 2023-09-20 (×10): 15 mL via OROMUCOSAL

## 2023-09-19 MED ORDER — PROPOFOL 1000 MG/100ML IV EMUL
0.0000 ug/kg/min | INTRAVENOUS | Status: DC
  Administered 2023-09-19: 50 ug/kg/min via INTRAVENOUS
  Administered 2023-09-19 (×5): 60 ug/kg/min via INTRAVENOUS
  Administered 2023-09-19: 70 ug/kg/min via INTRAVENOUS
  Administered 2023-09-20: 40 ug/kg/min via INTRAVENOUS
  Administered 2023-09-20: 20 ug/kg/min via INTRAVENOUS
  Administered 2023-09-20 (×5): 60 ug/kg/min via INTRAVENOUS
  Filled 2023-09-19: qty 200
  Filled 2023-09-19 (×2): qty 100
  Filled 2023-09-19 (×2): qty 200
  Filled 2023-09-19: qty 300
  Filled 2023-09-19: qty 200
  Filled 2023-09-19: qty 100

## 2023-09-19 MED ORDER — MORPHINE SULFATE (PF) 0.5 MG/ML IJ SOLN
INTRAMUSCULAR | Status: DC | PRN
  Administered 2023-09-19: 3 mg via EPIDURAL

## 2023-09-19 MED ORDER — ACETAMINOPHEN 10 MG/ML IV SOLN
INTRAVENOUS | Status: DC | PRN
Start: 2023-09-19 — End: 2023-09-19
  Administered 2023-09-19: 1000 mg via INTRAVENOUS

## 2023-09-19 MED ORDER — MIDAZOLAM HCL 2 MG/2ML IJ SOLN
INTRAMUSCULAR | Status: AC
  Filled 2023-09-19: qty 2

## 2023-09-19 MED ORDER — SODIUM CHLORIDE 0.9% IV SOLUTION: Freq: Once | INTRAVENOUS | Status: DC

## 2023-09-19 MED ORDER — TRANEXAMIC ACID-NACL 1000-0.7 MG/100ML-% IV SOLN: 1000.0000 mg | INTRAVENOUS | Status: AC

## 2023-09-19 MED ORDER — CLINDAMYCIN PHOSPHATE 900 MG/50ML IV SOLN
900.0000 mg | Freq: Once | INTRAVENOUS | Status: AC
  Administered 2023-09-19: 900 mg via INTRAVENOUS

## 2023-09-19 MED ORDER — TRANEXAMIC ACID-NACL 1000-0.7 MG/100ML-% IV SOLN
1000.0000 mg | Freq: Once | INTRAVENOUS | Status: AC
  Administered 2023-09-19: 1000 mg via INTRAVENOUS

## 2023-09-19 MED ORDER — FENTANYL CITRATE (PF) 100 MCG/2ML IJ SOLN
INTRAMUSCULAR | Status: AC
  Filled 2023-09-19: qty 2

## 2023-09-19 SURGICAL SUPPLY — 33 items
CHLORAPREP W/TINT 26 (MISCELLANEOUS) ×4 IMPLANT
CLAMP UMBILICAL CORD (MISCELLANEOUS) ×2 IMPLANT
CLOTH BEACON ORANGE TIMEOUT ST (SAFETY) ×2 IMPLANT
DERMABOND ADVANCED .7 DNX12 (GAUZE/BANDAGES/DRESSINGS) IMPLANT
DRESSING PREVENA PLUS CUSTOM (GAUZE/BANDAGES/DRESSINGS) IMPLANT
DRSG OPSITE POSTOP 4X10 (GAUZE/BANDAGES/DRESSINGS) ×2 IMPLANT
ELECTRODE REM PT RTRN 9FT ADLT (ELECTROSURGICAL) ×2 IMPLANT
EXTENDER TRAXI PANNICULUS (MISCELLANEOUS) IMPLANT
EXTRACTOR VACUUM KIWI (MISCELLANEOUS) IMPLANT
GLOVE BIO SURGEON STRL SZ 6 (GLOVE) ×2 IMPLANT
GLOVE BIOGEL PI IND STRL 7.0 (GLOVE) ×4 IMPLANT
GOWN STRL REUS W/TWL LRG LVL3 (GOWN DISPOSABLE) ×4 IMPLANT
KIT ABG SYR 3ML LUER SLIP (SYRINGE) IMPLANT
MAT PREVALON FULL STRYKER (MISCELLANEOUS) IMPLANT
NDL HYPO 25X5/8 SAFETYGLIDE (NEEDLE) IMPLANT
NEEDLE HYPO 22GX1.5 SAFETY (NEEDLE) IMPLANT
NEEDLE HYPO 25X5/8 SAFETYGLIDE (NEEDLE) IMPLANT
NS IRRIG 1000ML POUR BTL (IV SOLUTION) ×2 IMPLANT
PACK C SECTION WH (CUSTOM PROCEDURE TRAY) ×2 IMPLANT
PAD OB MATERNITY 4.3X12.25 (PERSONAL CARE ITEMS) ×2 IMPLANT
RETRACTOR TRAXI PANNICULUS (MISCELLANEOUS) IMPLANT
RETRACTOR WND ALEXIS 25 LRG (MISCELLANEOUS) IMPLANT
SUT MON AB 4-0 PS1 27 (SUTURE) ×2 IMPLANT
SUT PDS AB 0 CTX 60 (SUTURE) IMPLANT
SUT PLAIN 0 NONE (SUTURE) ×2 IMPLANT
SUT VIC AB 0 CT1 36 (SUTURE) ×2 IMPLANT
SUT VIC AB 0 CTX36XBRD ANBCTRL (SUTURE) ×4 IMPLANT
SUT VIC AB 2-0 CT1 TAPERPNT 27 (SUTURE) ×2 IMPLANT
SUTURE PLAIN GUT 2.0 ETHICON (SUTURE) IMPLANT
SYR CONTROL 10ML LL (SYRINGE) IMPLANT
TOWEL OR 17X24 6PK STRL BLUE (TOWEL DISPOSABLE) ×2 IMPLANT
TRAY FOLEY W/BAG SLVR 14FR LF (SET/KITS/TRAYS/PACK) IMPLANT
WATER STERILE IRR 1000ML POUR (IV SOLUTION) ×2 IMPLANT

## 2023-09-19 NOTE — Progress Notes (Signed)
 RT aspirated resp.culture and sent to lab at this time. This RT also obtained ABG, results shared with CCM.

## 2023-09-19 NOTE — Progress Notes (Addendum)
 Full consult to follow   Called for acute hypox resp failure/resp distress likely flash pulm edema   Pt has been on insulin  gtt + d5LR 125 hr Prior to arrival d5LR had been decr to 20/hr to minimize fluids, insulin  gtt at 1 unit/hr w CBG 100  I have asked RN to pause the insulin  gtt, and recheck a CBG in 30 min  Will reach out to DM coordinator -- I agree that minimizing IVF acutely is imperative. Might be that if endotool cannot be changed to reflect mIVF change that we need alt CBG management plan in interim      Ronnald Gave MSN, AGACNP-BC [facility] Pulmonary/Critical Care Medicine 09/19/2023, 8:42 AM

## 2023-09-19 NOTE — Op Note (Addendum)
 Laura Benson PROCEDURE DATE: 09/19/2023  PREOPERATIVE DIAGNOSES: Intrauterine pregnancy at [redacted]w[redacted]d weeks gestation; failed induction, failure to progress: arrest of dilation, severe preeclampsia, and pulmonary edema  POSTOPERATIVE DIAGNOSES: The same  PROCEDURE:  Primary Low Transverse Cesarean Section with vacuum assistance with Kiwi vacuum.  IUD insertion.   SURGEON:  Dr. Vina Solian  ASSISTANT:  Dr. Kieth Carolin, Dr. Vonn Inch, Dr. Evelena Sor. An experienced assistant was required given the standard of surgical care given the complexity of the case.  This assistant was needed for exposure, dissection, suctioning, retraction, instrument exchange, and for overall help during the procedure.   ANESTHESIOLOGY TEAM: Anesthesiologist: Stoltzfus, Cordella SQUIBB, DO; Boone Fess, MD CRNA: Ellison Elida RAMAN, CRNA; Cherilyn Slough, CRNA  INDICATIONS: Laura Benson is a 33 y.o. G1P1001 at [redacted]w[redacted]d here for cesarean section secondary to the indications listed under preoperative diagnoses; please see preoperative note for further details.  The risks of surgery were discussed with the patient including but were not limited to: bleeding which may require transfusion or reoperation; infection which may require antibiotics; injury to bowel, bladder, ureters or other surrounding organs; injury to the fetus; need for additional procedures including hysterectomy in the event of a life-threatening hemorrhage; formation of adhesions; placental abnormalities wth subsequent pregnancies; incisional problems; thromboembolic phenomenon and other postoperative/anesthesia complications.  The patient concurred with the proposed plan, giving informed written consent for the procedure.    FINDINGS:  Viable female infant in cephalic presentation.  Apgars 6 and 8.  Amniotic fluid: clear.  Intact placenta, three vessel cord.   Normal fallopian tubes and ovaries bilaterally. Uterus was extremely rotated to the patient's  right such that we could see her right ovary midline when initially entered.   ANESTHESIA: epidural, GETA INTRAVENOUS FLUIDS: 900 ml   ESTIMATED BLOOD LOSS: 533 ml URINE OUTPUT:  150 ml SPECIMENS: Placenta sent to pathology, cord gases COMPLICATIONS: None immediate  PROCEDURE IN DETAIL:  The patient preoperatively received intravenous antibiotics and had sequential compression devices applied to her lower extremities.  She was then taken to the operating room where the epidural was unable to be dosed up to a surgical level. SABRA She was then placed in a dorsal supine position with a leftward tilt, and prepped and draped in a sterile manner.  A foley catheter was already in place. She had a Traxi and extender applied. Her pannus was noted to be tilted to her right so the table was tilted to her left.   After an adequate timeout was performed. Then once all were ready and the patient was intubated, a Pfannenstiel skin incision was made with scalpel and carried through to the underlying layer of fascia. The fascia was incised in the midline, and this incision was extended bluntly. The rectus muscles were separated in the midline and the peritoneum was entered bluntly.   The Alexis self-retaining retractor was introduced into the abdominal cavity.  Attention was turned to the lower uterine segment which was noted to be extremely rotated to the patient's right (suspect this was due to the tilt that her pannus was chronically and likely what contributed to arrest of dilation). We then rotated her uterus to a more physiologic position and then a low transverse hysterotomy was made with a scalpel and extended bilaterally bluntly.  The infant was successfully delivered with assistance with Kiwi vacuum - no pop-offs. The cord was clamped and cut immediately, and the infant was handed over to the awaiting neonatology team. TXA had been given preoperatively.  Uterine massage was then administered, and the placenta  delivered intact with a three-vessel cord. The uterus was exteriorized and then cleared of clots and debris.  The hysterotomy was closed with 0 Vicryl in a running fashion.  2 additional Figure-of-eight 0 Vicryl serosal stitches were placed to help with hemostasis.    The pelvis was cleared of all clot and debris. Hemostasis was confirmed on all surfaces.  The uterus was returned to the abdomen. The retractor was removed.  The peritoneum was closed with a 2-0 Vicryl running stitch. The fascia was then closed using 0 Vicryl in a running fashion.  The subcutaneous layer was irrigated, any areas of bleeding were cauterized with the bovie and was reapproximated with 2-0 plain gut in a running fashion. The skin was closed with a 4-0 monocryl subcuticular stitch. The patient tolerated the procedure well. Sponge, instrument and needle counts were correct x 3. A Prevena was placed over the incision.   She was taken to the ICU intubated in stable condition.   She is not a TOLAC/VBAC candidate. Would also not recommend pregnancy again.   Vina Solian, MD Attending Obstetrician & Gynecologist, [facility] for [facility], [facility]

## 2023-09-19 NOTE — Lactation Note (Signed)
 This note was copied from a baby's chart. Lactation Consultation Note  Patient Name: Laura Benson Date: 09/19/2023 Age:34 hours Reason for consult: Initial assessment;Primapara;1st time breastfeeding;Early term 37-38.6wks;Infant < 5lbs;Maternal endocrine disorder  P1- Infant was born at 22w0g GA weighing 2220g. MOB's feeding plan is both breast milk and formula. Infant is present in room 405 with FOB only. MOB is currently admitted to room 3M11. LC attempted to check on MOB's status, but the RN could not talk at this time, so LC will follow up later. [facility] consulted with FOB to ensure feeding success for infant. LC reviewed the LPI/low birth weight feeding plan set in place for infant due to low birth weight. Infant is supplementing with Similac Neosure at this time. LC reviewed how to pace feed infant. LC also reviewed his expected volume intakes. LC reviewed how when MOB is stable and ready for a consult, the LC will set her up with the hospital DEBP so she could express her colostrum for infant. LC reviewed the first 24 hr birthday nap, day 2 cluster feeding, feeding infant on cue 8-12x in 24 hrs, not allowing infant to go over 3 hrs without a feeding, and to keep the feeding within 30 minutes. LC encouraged FOB to call for assistance as needed. FOB asked if infant could go to the nursery so he could sleep. LC reviewed how this might not be possible and we like for the infants to stay with the parents. LC asked infant's RN and she said no.  Maternal Data Has patient been taught Hand Expression?: No Does the patient have breastfeeding experience prior to this delivery?: No  Feeding Mother's Current Feeding Choice: Breast Milk and Formula Nipple Type: Nfant Slow Flow (purple)  Interventions Interventions: Education;Pace feeding;LPT handout/interventions  Discharge Discharge Education: Warning signs for feeding baby (To FOB)  Consult Status Consult Status: Follow-up Date:  09/20/23 Follow-up type: In-patient    Recardo Hoit BS, IBCLC 09/19/2023, 3:43 PM

## 2023-09-19 NOTE — Progress Notes (Signed)
 CTSP due to vaginal bleeding. Patient already evaluated by [facility] for [facility].   Lisa appropriately had done uterotonics and TXA. She had removed the IUD and placed a JADA.   Bleeding was minimal by the time of my arrival.   Total EBL was 546 by weight and what was in the canister (after removing it from JADA tubing).   Checking DIC labs.   Will reassess JADA amount in one hour.  Due to her high risk nature, will leave JADA in for now.   Vina Solian, MD Attending Obstetrician & Gynecologist, [facility] for [facility], [facility]

## 2023-09-19 NOTE — Progress Notes (Signed)
 Called to pt room by rapid response RN to place pt on BiPAP at this time. CCMD aware, pt tolerating BiPAP well at this time

## 2023-09-19 NOTE — Progress Notes (Signed)
 Received a call from [facility] RN and was asked to come and assess her vaginal bleeding.1525 I arrived at the pt's bedside. Her fundus was U/E and firm, but her peripad and bed pad were nearly saturated with bright red blood. When I massaged her fundus there was a steady trickle of bright red blood and some blood was clotting on the peri pad.I called Dr. Cleatus to come and assess her bleeding when she was finished with her C/S.1531, I received a call from the nurse circulating Dr. Elfredia case saying I needed to call [facility] [employer] to assess her bleeding because Dr. Cleatus was going to be awhile in the OR. 1532, I called [facility] Rollie Fuelling CNM to come and assess the pt. 1535 Olam CNM is at the bedside. She checked the pt internally and removed some blood clots and said there were still some left inside that she could not get to. 1542, I called Wyvonna Lemmings RNC, charge nurse to bring us  of cytotec , methergine , and TXA.1547. Wyvonna RN is at bedside. Methergine  was given IM in her left thigh, 1000mg  of cytotec  was placed rectally, and TXA was hung and infusing. See MAR for exact times of medication administration.1550, Lisa CNM asked for a JADA and one was brought up from L&D. 1600, Olam CNM placed the JADA. Bright red blood was noted flowing up the tubing from the JADA. 1614, I called Dr. Cleatus to come and assess the bleeding from the JADA. 1616, Dr. Cleatus is at the bedside. She is satisfied with the amount of bleeding the pt has and we will check on the pt again in one hour.

## 2023-09-19 NOTE — Discharge Summary (Signed)
 Postpartum Discharge Summary  Date of Service updated***     Patient Name: Laura Benson DOB: 01-07-1991 MRN: [id number]  Date of admission: 09/17/2023 Delivery date:09/19/2023 Delivering provider: CLEATUS MOCCASIN Date of discharge: 09/19/2023  Admitting diagnosis: Type 2 diabetes mellitus during pregnancy [O24.119] Intrauterine pregnancy: [redacted]w[redacted]d     Secondary diagnosis:  Principal Problem:   Type 2 diabetes mellitus during pregnancy Active Problems:   Severe obesity (BMI >= 40) (HCC)   Chronic hypertension in pregnancy   GAD (generalized anxiety disorder)   Anti M Antibody positive   Respiratory distress  Additional problems: ***    Discharge diagnosis: {DX.:23714}                                              Post partum procedures:{Postpartum procedures:23558} Augmentation: AROM, Pitocin , Cytotec , and IP Foley Complications: {OB Labor/Delivery Complications:20784}  Hospital course: Induction of Labor With Cesarean Section   33 y.o. yo G1P1001 at [redacted]w[redacted]d was admitted to the hospital 09/17/2023 for induction of labor. Patient had a labor course significant for arrest of dilation/failed IOL in the setting of SIPE with SF complicated by pulmonary edema. Given the lack of cervical change over 18 hours and worsening status for her health, we recommend LTCS. Delivery details are as follows: Membrane Rupture Time/Date: 4:10 PM,09/18/2023  Delivery Method:C-Section, Vacuum Assisted Operative Delivery: Device used: Kiwi Indication: Fetal indications Details of operation can be found in separate operative Note.    After her surgery, the patient went to the ICU for initial recovery. She had a postpartum hemorrhage that was managed with uterotonics and JADA. The JADA was removed on 8/24. She was also extubated on 8/24.  She was weaned down to 4L [place] and was transferred from the ICU to [facility].  ***. Patient had a postpartum course complicated by***. She is ambulating, tolerating a regular diet,  passing flatus, and urinating well.  Patient is discharged home in stable condition on 09/19/23.      Newborn Data: Birth date:09/19/2023 Birth time:10:24 AM Gender:Female Living status:Living Apgars:6 ,8  Weight:2220 g                               Magnesium  Sulfate received: Yes: Seizure prophylaxis until pulmonary edema BMZ received: No Rhophylac:N/A MMR:Yes T-DaP:Given prenatally Flu: No RSV Vaccine received: No Transfusion:{Transfusion received:30440034}  Immunizations received: Immunization History  Administered Date(s) Administered   H1N1 12/02/2007   PPD Test 02/22/2021   Pneumococcal Polysaccharide-23 11/01/2019   Tdap 01/18/2019, 08/14/2023    Physical exam  Vitals:   09/19/23 0906 09/19/23 0917 09/19/23 0930 09/19/23 0940  BP: (!) 146/89  (!) 149/92   Pulse: (!) 119  (!) 115   Resp:  (!) 40 (!) 36   Temp:      TempSrc:      SpO2: 97% 98% 99% 98%  Weight:      Height:       General: {Exam; general:21111117} Lochia: {Desc; appropriate/inappropriate:30686::appropriate} Uterine Fundus: {Desc; firm/soft:30687} Incision: {Exam; incision:21111123} DVT Evaluation: {Exam; dvt:2111122} Labs: Lab Results  Component Value Date   WBC 8.5 09/19/2023   HGB 12.0 09/19/2023   HCT 37.7 09/19/2023   MCV 75.9 (L) 09/19/2023   PLT 249 09/19/2023      Latest Ref Rng & Units 09/19/2023    8:05 AM  CMP  Glucose 70 - 99 mg/dL 89   BUN 6 - 20 mg/dL 7   Creatinine 9.55 - 8.99 mg/dL 8.95   Sodium 864 - 854 mmol/L 133   Potassium 3.5 - 5.1 mmol/L 3.9   Chloride 98 - 111 mmol/L 106   CO2 22 - 32 mmol/L 19   Calcium 8.9 - 10.3 mg/dL 8.5   Total Protein 6.5 - 8.1 g/dL 6.5   Total Bilirubin 0.0 - 1.2 mg/dL 0.8   Alkaline Phos 38 - 126 U/L 104   AST 15 - 41 U/L 20   ALT 0 - 44 U/L 12    Edinburgh Score:     No data to display         No data recorded  After visit meds:  Allergies as of 09/19/2023   No Known Allergies   Med Rec must be completed prior to  using this [facility]***        Discharge home in stable condition Infant Feeding: Bottle and Breast Infant Disposition:{CHL IP OB HOME WITH FNUYZM:76418} Discharge instruction: per After Visit Summary and Postpartum booklet. Activity: Advance as tolerated. Pelvic rest for 6 weeks.  Diet: routine diet Future Appointments: Future Appointments  Date Time Provider Department Center  10/08/2023  9:20 AM Tobb, Kardie, DO [facility]   Follow up Visit:   Please schedule this patient for a In person postpartum visit in 4 weeks with the following provider: MD. Additional Postpartum F/U:Incision check 1 week and BP check 1 week  High risk pregnancy complicated by: HTN, SIPE, T2DM Delivery mode:  C-Section, Vacuum Assisted Anticipated Birth Control:  PP IUD placed   09/19/2023 Vina Solian, MD

## 2023-09-19 NOTE — Progress Notes (Signed)
 Transported pt from 2S11 to OR on BiPAP without complications

## 2023-09-19 NOTE — Consult Note (Signed)
 NAME:  Laura Benson, MRN:  [id number], DOB:  [birth year], LOS: 2 ADMISSION DATE:  09/17/2023, CONSULTATION DATE:  09/19/23 REFERRING MD:  Erik - OB, CHIEF COMPLAINT:  resp distress    History of Present Illness:  33 yo F G1P0 [redacted]w[redacted]d w difficult to control chronic HTN (rcvd report of sev pre-eclampsia but in review of outpt notes it looks like this is chronic severe HTN and has not had features of sev pre-eclampsia) DM2, OSA, morbid obesity who presented for IOL 09/17/23. Requiring insulin  gtt for glucose management.  Epidural placed 8/22. Developed SOB 8/23 early AM.  CXR w R sided patchy opacity. Had progressive O2 req 8/23 morning req NRB, was given IV lasix  40 x 2 with concern for pulmonary edema and started on BiPAP after rapid response.   PCCM consulted in this setting    Pertinent  Medical History  Obesity  OSA Pre-eclampsia DM2  HTN   Significant Hospital Events: Including procedures, antibiotic start and stop dates in addition to other pertinent events   8/21 presented for IOL. Insulin  gtt 8/22 epidural  8/23 new O2 req, progressive SOB, NRB --> BiPAP. Diuresing. PCCM consult   Interim History / Subjective:  Rapid, RT, nursing, OB fellow and Anesthesia at bedside d5LR has been decr to 25lhr  Has rcvd second dose of lasix  40 IV  Starting on BiPAP  Objective    Blood pressure 116/72, pulse (!) 107, temperature 98 F (36.7 C), temperature source Axillary, resp. rate (!) 28, height 5' 6 (1.676 m), weight (!) 193.6 kg, last menstrual period 01/03/2023, SpO2 97%.    FiO2 (%):  [50 %-100 %] 70 %   Intake/Output Summary (Last 24 hours) at 09/19/2023 0804 Last data filed at 09/19/2023 0731 Gross per 24 hour  Intake 630 ml  Output 2550 ml  Net -1920 ml   Filed Weights   09/17/23 1117  Weight: (!) 193.6 kg    Examination: General: Morbidly obese adult F NAD  HENT: BiPAP in place w good seal. NCAT  Lungs: Diminished bases, scattered anterior rales, shallow  inspiratory effort. No accessory use  Cardiovascular: cap refill brisk and cap refill < 3 sec  Abdomen: round  Extremities: no obvious acute joint deformity.  Neuro: awake following commands  GU: foley clear light yellow urine   Resolved problem list   Assessment and Plan    Acute resp failure w hypoxia OSA Obesity  -cxr early AM w R patchy opacities, read from rad is c/f PNA but clinically more suggestive of pulm edema -has rcvd 40 IV lasix  x 2 (2nd dose <10 min prior to my arrival), and is making urine  P -Continue BiPAP -repeat CXR -dont think ABG would change management at this point, have asked RT to defer for now  -we have room from both a hemodynamic and renal fxn standpoint to repeat diuresis if needed -- will follow what her UOP looks like v resp status  -high likelihood of needing   G1P0 [redacted]w[redacted]d Severe chronic HTN P -plan for C section 8/23 morning. Hope is that this can be done w epidural + BiPAP, but could req intubation / GA  -- MDA has discussed this w pt  -cont aggressive BP management -have d/w her primary RN who is going to keep me in the loop re hourly UOP  IDDM  -insulin  pump at home P -on an insulin  gtt but usual d5LR had been reduced. Not sure that endotool orders can be changed to reflect mIVF change  however so have asked RN to pause insulin  gtt and recheck CBG in 30 min  -I have messaged primary and DM coordinator to see if there an alternative to insulin  gtt preceding her cesarean  -if no alt, and if unable to change endotool then d5LR would need to incr again (in context of her worsening pulm edema would be great to minimize)    Best Practice (right click and Reselect all SmartList Selections daily)   Diet/type: NPO DVT prophylaxis none  Pressure ulcer(s): N/A GI prophylaxis: N/A Lines: N/A Foley:  Yes, and it is still needed Code Status:  full code Last date of multidisciplinary goals of care discussion [8/23]  Labs   CBC: Recent Labs   Lab 09/17/23 1343 09/18/23 1014 09/18/23 1844 09/19/23 0411  WBC 5.4 7.3 7.0 8.5  HGB 10.8* 11.6* 11.7* 12.0  HCT 33.9* 36.8 37.5 37.7  MCV 76.2* 75.4* 76.2* 75.9*  PLT 256 235 245 249    Basic Metabolic Panel: Recent Labs  Lab 09/17/23 1343 09/18/23 1844 09/19/23 0411  NA 138 134* 134*  K 4.3 3.7 4.0  CL 106 106 107  CO2 21* 21* 20*  GLUCOSE 128* 85 100*  BUN 9 8 7   CREATININE 1.00 0.96 1.00  CALCIUM 9.1 8.9 8.2*  MG  --   --  4.4*   GFR: Estimated Creatinine Clearance: 142.7 mL/min (by C-G formula based on SCr of 1 mg/dL). Recent Labs  Lab 09/17/23 1343 09/18/23 1014 09/18/23 1844 09/19/23 0411  WBC 5.4 7.3 7.0 8.5    Liver Function Tests: Recent Labs  Lab 09/17/23 1343 09/18/23 1844 09/19/23 0411  AST 34 20 20  ALT 11 15 15   ALKPHOS 77 98 99  BILITOT 1.0 0.9 0.6  PROT 5.9* 6.4* 6.3*  ALBUMIN 2.7* 2.8* 2.6*   No results for input(s): LIPASE, AMYLASE in the last 168 hours. No results for input(s): AMMONIA in the last 168 hours.  ABG    Component Value Date/Time   TCO2 24 12/04/2015 0845     Coagulation Profile: No results for input(s): INR, PROTIME in the last 168 hours.  Cardiac Enzymes: No results for input(s): CKTOTAL, CKMB, CKMBINDEX, TROPONINI in the last 168 hours.  HbA1C: Hgb A1c MFr Bld  Date/Time Value Ref Range Status  03/24/2023 03:58 PM 6.2 (H) 4.8 - 5.6 % Final    Comment:             Prediabetes: 5.7 - 6.4          Diabetes: >6.4          Glycemic control for adults with diabetes: <7.0   09/01/2021 06:58 AM 6.1 (H) 4.8 - 5.6 % Final    Comment:    (NOTE) Pre diabetes:          5.7%-6.4%  Diabetes:              >6.4%  Glycemic control for   <7.0% adults with diabetes     CBG: Recent Labs  Lab 09/17/23 1415 09/18/23 0220  GLUCAP 98 110*    Review of Systems:   Review of Systems  Respiratory:  Positive for shortness of breath.   Gastrointestinal:  Positive for abdominal pain.   *abdominal pain -- contractions    Past Medical History:  She,  has a past medical history of Anxiety, Asthma, Complication of anesthesia, Depression, Diabetes mellitus without complication (HCC), Hypertension, Morbid obesity (HCC), PCOS (polycystic ovarian syndrome), Sleep apnea, and Tachycardia.   Surgical History:  Past Surgical History:  Procedure Laterality Date   ADENOIDECTOMY  2006   LAPAROSCOPIC GASTRIC SLEEVE RESECTION N/A 04/16/2020   Procedure: LAPAROSCOPIC GASTRIC SLEEVE RESECTION;  Surgeon: Tanda Locus, MD;  Location: [facility];  Service: General;  Laterality: N/A;   TONSILLECTOMY  2006   UPPER GI ENDOSCOPY N/A 04/16/2020   Procedure: UPPER GI ENDOSCOPY;  Surgeon: Tanda Locus, MD;  Location: [facility];  Service: General;  Laterality: N/A;     Social History:   reports that she has quit smoking. Her smoking use included cigarettes. She has never used smokeless tobacco. She reports current drug use. Drug: Marijuana. She reports that she does not drink alcohol.   Family History:  Her family history includes Breast cancer in her cousin; Colon cancer in her father; Diabetes in her father, mother, paternal grandmother, and sister; Hypertension in her father, maternal grandmother, mother, and paternal grandmother; Stroke in her mother.   Allergies No Known Allergies   Home Medications  Prior to Admission medications   Medication Sig Start Date End Date Taking? Authorizing Provider  amLODipine  (NORVASC ) 10 MG tablet Take 1 tablet (10 mg total) by mouth daily. 08/14/23  Yes Cresenzo, John V, MD  aspirin  EC 81 MG tablet Take 1 tablet (81 mg total) by mouth 2 (two) times daily. Swallow whole. 07/06/23  Yes [name], MD  hydrALAZINE  (APRESOLINE ) 50 MG tablet Take 1 tablet (50 mg total) by mouth 3 (three) times daily. 09/16/23 12/15/23 Yes Tobb, Kardie, DO  labetalol  (NORMODYNE ) 300 MG tablet Take 1 tablet (300 mg total) by mouth 3 (three) times daily. Take 1 tablet (200 mg  total) by mouth 2 (two) times daily. 08/28/23  Yes [name], MD  sertraline  (ZOLOFT ) 50 MG tablet Take 1 tablet (50 mg total) by mouth daily. 07/06/23  Yes [name], MD  acetaminophen -caffeine (EXCEDRIN  TENSION HEADACHE) 500-65 MG TABS per tablet Take 2 tablets by mouth every 8 (eight) hours as needed. 03/05/23   Cresenzo, John V, MD  albuterol  (VENTOLIN  HFA) 108 (90 Base) MCG/ACT inhaler Inhale 2 puffs into the lungs every 6 (six) hours as needed for wheezing or shortness of breath. 02/03/22   Frann Mabel Mt, DO  Continuous Glucose Receiver (DEXCOM G7 RECEIVER) DEVI 1 Units by Does not apply route daily. 05/20/23   [name], MD  Continuous Glucose Sensor (DEXCOM G7 SENSOR) MISC Apply one sensor every 10 days ICD10: O24.414 (Gestational diabetes, insulin  dependent) 07/06/23   [name], MD  fluticasone  (FLONASE ) 50 MCG/ACT nasal spray Place 2 sprays into both nostrils daily. 11/22/22   Henderly, Britni A, PA-C  Insulin  Disposable Pump (OMNIPOD 5 DEXG7G6 INTRO GEN 5) KIT 1 Device by Does not apply route every 3 (three) days. 06/16/23   [name], MD  Insulin  Disposable Pump (OMNIPOD 5 DEXG7G6 PODS GEN 5) MISC 1 Device by Does not apply route every 3 (three) days. 07/24/23   Cresenzo, John V, MD  insulin  lispro (HUMALOG ) 100 UNIT/ML injection For use with insulin  pump. Basal rate: 0.5 u/hr. Bolus: 5 units TID with meals. 07/24/23   Cresenzo, John V, MD  Insulin  Pen Needle (BD PEN NEEDLE NANO 2ND GEN) 32G X 4 MM MISC 1 Units by Does not apply route daily. 06/08/23   [name], MD  Prenatal 28-0.8 MG TABS Take 1 tablet by mouth daily. 08/28/23   [name], MD  promethazine  (PHENERGAN ) 25 MG tablet Take 1 tablet (25 mg total) by mouth every 6 (  six) hours as needed for nausea or vomiting. 03/04/23   Walker, Jamilla R, CNM  traMADol  (ULTRAM ) 50 MG tablet Take 1 tablet (50 mg total) by mouth every 6 (six) hours as needed for moderate pain  (pain score 4-6). 08/13/23   Trudy Earnie CROME, CNM     Critical care time: 63 min       CRITICAL CARE Performed by: Ronnald FORBES Gave   Total critical care time: 63 minutes  Critical care time was exclusive of separately billable procedures and treating other patients. Critical care was necessary to treat or prevent imminent or life-threatening deterioration.  Critical care was time spent personally by me on the following activities: development of treatment plan with patient and/or surrogate as well as nursing, discussions with consultants, evaluation of patient's response to treatment, examination of patient, obtaining history from patient or surrogate, ordering and performing treatments and interventions, ordering and review of laboratory studies, ordering and review of radiographic studies, pulse oximetry and re-evaluation of patient's condition.  Ronnald Gave MSN, AGACNP-BC [facility] Amion for pager  09/19/2023, 8:54 AM

## 2023-09-19 NOTE — Consult Note (Addendum)
 Pharmacy Antibiotic Note  Laura Benson is a 33 y.o. female with possiblechorio now post CS.  Pharmacy has been consulted for Gentamicin  dosing. SCR 1.04, Tmax 100.7. planning for 24 hours of antibiotics.   Plan: Gentamicin  5mg /kg (adjusted body weight) x1 Clindamycin  900mg  Q8 hours x24 hours Will monitor fevers  Height: 5' 6 (167.6 cm) Weight: (!) 193.6 kg (426 lb 12.8 oz) IBW/kg (Calculated) : 59.3  Temp (24hrs), Avg:98.6 F (37 C), Min:97.9 F (36.6 C), Max:100.7 F (38.2 C)  Recent Labs  Lab 09/17/23 1343 09/18/23 1014 09/18/23 1844 09/19/23 0411 09/19/23 0805  WBC 5.4 7.3 7.0 8.5  --   CREATININE 1.00  --  0.96 1.00 1.04*    Estimated Creatinine Clearance: 137.3 mL/min (A) (by C-G formula based on SCr of 1.04 mg/dL (H)).    No Known Allergies  Antimicrobials this admission: 8/23 azithro (pre-op) clindamycin  8/23 >>  gentamicin  8/23 >>  Dose adjustments this admission:   Microbiology results: 8/23 Sputum:     Thank you for allowing pharmacy to be a part of this patient's care.  Glenys Bidding, PharmD, BCPPS 09/19/2023 2:30 PM

## 2023-09-19 NOTE — Progress Notes (Signed)
 Labor progress note  In to reassess patient due to recurrent late decelerations and maternal desaturation. Maternal O2 sats down to high 70s. Non-rebreather mask placed with improvement of O2 sats to mid to high 80s. EFM: 125/min to mod/-a/-d. Pitocin  stopped at this time.   Given discontinuation of Pitocin  and overall clinical status, I did discuss with patient and her family members at bedside the possibility of a cesarean delivery if we are unable to use Pitocin  for labor induction. At this time though, I recommended we focus on improving respiratory status -- will give IV Lasix  40mg  and reassess respiratory status. Once respiratory status better treated, plan to discuss labor course further. Patient agreed to plan.  Plan discussed w Dr. Erik.   Alain Sor, MD OB Fellow, Faculty Practice [facility], Center for [facility]

## 2023-09-19 NOTE — Progress Notes (Signed)
 Labor progress note  In to check on patient after breathing treatment. She still feels SOB. On lung exam, some rhonchi bilateral lower lung fields, mild tachypnea. EFM: 130/mod/-a/-d. Cx unchanged (4.5/60/-3). IUPC replaced. Will cont to uptitrate Pitocin  as able. Will obtain stat CXR due to suspected pulmonary edema will start IV diuretics pending CXR findings. Pt and family updated on plan.   Alain Sor, MD OB Fellow, Faculty Practice [facility], Center for [facility]

## 2023-09-19 NOTE — Progress Notes (Signed)
 Labor progress note  I returned to bedside to recheck patient's cervix -- unchanged, still 4.5/50/-2. She agrees to proceed with cesarean delivery.  Also discussed plan for contraception -- pt would like Mirena  IUD. Desires intraoperative placement of IUD. Will order.  Alain Sor, MD OB Fellow, Faculty Practice [facility], Center for [facility]

## 2023-09-19 NOTE — Inpatient Diabetes Management (Addendum)
 Inpatient Diabetes Program Recommendations  Diabetes Treatment Program Recommendations  ADA Standards of Care Diabetes in Pregnancy Target Glucose Ranges:  Fasting: 70 - 95 mg/dL 1 hr postprandial: Less than 140mg /dL (from first bite of meal) 2 hr postprandial: Less than 120 mg/dL (from first bite of meal)     Latest Reference Range & Units 09/18/23 02:20 09/19/23 08:02  Glucose-Capillary 70 - 99 mg/dL 889 (H) 899 (H)    Review of Glycemic Control  Diabetes history: DM2 (dx 2020; hx of gastric sleeve 2022), 36W Outpatient Diabetes medications: Omnipod 0.5 units/hr, I:CR 1:11 grams, I:SF 1:70 mg/dl Prior to Pregnancy: Ozempic  Qwk Lantus  16 units every day (prior to gastric sleeve) Current orders for Inpatient glycemic control: IV insulin    Inpatient Diabetes Program Recommendations:     Insulin : Per Dr. Sallee note on 8/22 at 2:32 patients insulin  pump fell off and patient had not supplies to restart pump so IV insulin  was started.  Patient currently in respiratory distress with possible pulmonary edema and will have c-section.  IF patient will be going to OR for c-section in the next hour, agree with stopping IV insulin  and IV fluids; would check CBGs Q1H and use Novolog  correction Q4H.     Insulin  Recommendations Following delivery: -CBGs Q4H, Novolog  0-9 units Q4H  Addendum 09/19/23@8 :58-Grace Bowser, NP sent chat message to ask if patient could come off IV insulin  and if IV fluids could be decreased or stopped due to worsening respiratory distress likely due to flash pulmonary edema. Plan now with patient to have c-section. Agree with stopping IV insulin  drip and IV fluids as patient going to OR. Would order Novolog  correction Q4H and CBGs Q1H for now; then following delivery, CBGs Q4H and Novolog  0-9 units Q4H.   Thanks, Earnie Gainer, RN, MSN, CDCES Diabetes Coordinator Inpatient Diabetes Program [phone] (Team Pager from 8am to 5pm)

## 2023-09-19 NOTE — Progress Notes (Signed)
 Laura Benson is U/E and firm. There is no bleeding around the JADA.There is blood in the tubing all the way to the canister and there is 300 ml in the canister. Dr. Cleatus notified. We will leave the JADA in for now. [name] will be checking on the pt every 4 hours now.

## 2023-09-19 NOTE — Transfer of Care (Signed)
 Immediate Anesthesia Transfer of Care Note  Patient: Laura Benson  Procedure(s) Performed: CESAREAN DELIVERY (Abdomen) INSERTION, INTRAUTERINE DEVICE  Patient Location: ICU 68M  Anesthesia Type:General and Epidural  Level of Consciousness: sedated and Patient remains intubated per anesthesia plan  Airway & Oxygen Therapy: Patient placed on Ventilator (see vital sign flow sheet for setting) Respiratory therapist accompanied patient with ventilator ICU  Post-op Assessment: Report given to RN and Post -op Vital signs reviewed and stable  Post vital signs: Reviewed and stable  Last Vitals:  Vitals Value Taken Time  BP 135/79 09/19/23 12:15  Temp    Pulse 108 09/19/23 12:22  Resp 21 09/19/23 12:22  SpO2 94 % 09/19/23 12:22  Vitals shown include unfiled device data.  Last Pain:  Vitals:   09/19/23 0820  TempSrc: Axillary  PainSc:          Complications:  Encounter Notable Events  Notable Event Outcome Phase Comment  Difficult to intubate - expected  Intraprocedure Filed from anesthesia note documentation.

## 2023-09-19 NOTE — Anesthesia Procedure Notes (Signed)
 Procedure Name: Intubation Date/Time: 09/19/2023 10:17 AM  Performed by: Ellison Elida RAMAN, CRNAPre-anesthesia Checklist: Patient identified, Emergency Drugs available, Suction available, Patient being monitored and Timeout performed Patient Re-evaluated:Patient Re-evaluated prior to induction Oxygen Delivery Method: Circle system utilized Preoxygenation: Pre-oxygenation with 100% oxygen Induction Type: IV induction, Cricoid Pressure applied and Rapid sequence Laryngoscope Size: Glidescope and 3 Grade View: Grade III Tube type: Oral Tube size: 7.5 mm Number of attempts: 1 Airway Equipment and Method: Patient positioned with wedge pillow and Video-laryngoscopy Placement Confirmation: ETT inserted through vocal cords under direct vision, positive ETCO2 and breath sounds checked- equal and bilateral Dental Injury: Teeth and Oropharynx as per pre-operative assessment  Difficulty Due To: Difficulty was anticipated Future Recommendations: Recommend- induction with short-acting agent, and alternative techniques readily available

## 2023-09-19 NOTE — Progress Notes (Signed)
 Cardio-Obstetrics Clinic  New Evaluation  Date:  09/19/2023   ID:  Laura Benson, DOB [birth year], MRN [id number]  PCP:  Center, [facility]   [facility] Providers Cardiologist:  Dub Huntsman, DO  Electrophysiologist:  None       Referring MD: Center, [facility]   Chief Complaint:  my blood pressure is elevated  History of Present Illness:    Laura Benson is a 33 y.o. female [G1P1001] who is being seen today for chronic hypertension in pregnancy.   Medical hx includes hypertension, anxiety, PCOS, Sleep apnea on cpap, morbid obesity. hypertension who presents for blood pressure management and upcoming induction of labor.   She is on amlodipine , labetalol  three times daily, and hydralazine  for hypertension. Initial severe nausea from medication resolved after a few days. Blood pressure was elevated at a previous visit. She experiences significant anxiety with symptoms of a racing heart and difficulty breathing, which she associates with her upcoming delivery.  Prior CV Studies Reviewed: The following studies were reviewed today: None today   Past Medical History:  Diagnosis Date   Anxiety    Asthma    Last used rescue inhaler 6mon ago   Complication of anesthesia    Take for a while to wake up   Depression    Diabetes mellitus without complication (HCC)    Hypertension    Morbid obesity (HCC)    PCOS (polycystic ovarian syndrome)    Sleep apnea    CPAP  last used CPAP 2y ago   Tachycardia     Past Surgical History:  Procedure Laterality Date   ADENOIDECTOMY  2006   CESAREAN SECTION N/A 09/19/2023   Procedure: CESAREAN DELIVERY;  Surgeon: Cleatus Moccasin, MD;  Location: [facility] LD ORS;  Service: Obstetrics;  Laterality: N/A;   INTRAUTERINE DEVICE (IUD) INSERTION N/A 09/19/2023   Procedure: INSERTION, INTRAUTERINE DEVICE;  Surgeon: Cleatus Moccasin, MD;  Location: [facility] LD ORS;  Service: Obstetrics;  Laterality: N/A;   LAPAROSCOPIC GASTRIC SLEEVE  RESECTION N/A 04/16/2020   Procedure: LAPAROSCOPIC GASTRIC SLEEVE RESECTION;  Surgeon: Tanda Locus, MD;  Location: [facility] ORS;  Service: General;  Laterality: N/A;   TONSILLECTOMY  2006   UPPER GI ENDOSCOPY N/A 04/16/2020   Procedure: UPPER GI ENDOSCOPY;  Surgeon: Tanda Locus, MD;  Location: [facility] ORS;  Service: General;  Laterality: N/A;      OB History     Gravida  1   Para  1   Term  1   Preterm      AB      Living  1      SAB      IAB      Ectopic      Multiple  0   Live Births  1               Current Medications: Current Meds  Medication Sig   acetaminophen -caffeine (EXCEDRIN  TENSION HEADACHE) 500-65 MG TABS per tablet Take 2 tablets by mouth every 8 (eight) hours as needed.   albuterol  (VENTOLIN  HFA) 108 (90 Base) MCG/ACT inhaler Inhale 2 puffs into the lungs every 6 (six) hours as needed for wheezing or shortness of breath.   amLODipine  (NORVASC ) 10 MG tablet Take 1 tablet (10 mg total) by mouth daily.   aspirin  EC 81 MG tablet Take 1 tablet (81 mg total) by mouth 2 (two) times daily. Swallow whole.   [EXPIRED] Blood Pressure Monitoring (BLOOD PRESSURE MONITOR AUTOMAT) DEVI Use as directed.   Continuous Glucose Receiver (  DEXCOM G7 RECEIVER) DEVI 1 Units by Does not apply route daily.   Continuous Glucose Sensor (DEXCOM G7 SENSOR) MISC Apply one sensor every 10 days ICD10: O24.414 (Gestational diabetes, insulin  dependent)   fluticasone  (FLONASE ) 50 MCG/ACT nasal spray Place 2 sprays into both nostrils daily.   hydrALAZINE  (APRESOLINE ) 50 MG tablet Take 1 tablet (50 mg total) by mouth 3 (three) times daily.   Insulin  Disposable Pump (OMNIPOD 5 DEXG7G6 INTRO GEN 5) KIT 1 Device by Does not apply route every 3 (three) days.   Insulin  Disposable Pump (OMNIPOD 5 DEXG7G6 PODS GEN 5) MISC 1 Device by Does not apply route every 3 (three) days.   insulin  lispro (HUMALOG ) 100 UNIT/ML injection For use with insulin  pump. Basal rate: 0.5 u/hr. Bolus: 5 units TID with meals.    Insulin  Pen Needle (BD PEN NEEDLE NANO 2ND GEN) 32G X 4 MM MISC 1 Units by Does not apply route daily.   labetalol  (NORMODYNE ) 300 MG tablet Take 1 tablet (300 mg total) by mouth 3 (three) times daily. Take 1 tablet (200 mg total) by mouth 2 (two) times daily.   Prenatal 28-0.8 MG TABS Take 1 tablet by mouth daily.   promethazine  (PHENERGAN ) 25 MG tablet Take 1 tablet (25 mg total) by mouth every 6 (six) hours as needed for nausea or vomiting.   sertraline  (ZOLOFT ) 50 MG tablet Take 1 tablet (50 mg total) by mouth daily.   traMADol  (ULTRAM ) 50 MG tablet Take 1 tablet (50 mg total) by mouth every 6 (six) hours as needed for moderate pain (pain score 4-6).   [DISCONTINUED] hydrALAZINE  (APRESOLINE ) 25 MG tablet Take 1 tablet (25 mg total) by mouth 3 (three) times daily.     Allergies:   Patient has no known allergies.   Social History   Socioeconomic History   Marital status: Single    Spouse name: Not on file   Number of children: 0   Years of education: Not on file   Highest education level: 12th grade  Occupational History    Employer: [employer]  Tobacco Use   Smoking status: Former    Types: Cigarettes   Smokeless tobacco: Never  Vaping Use   Vaping status: Never Used  Substance and Sexual Activity   Alcohol use: No   Drug use: Yes    Types: Marijuana    Comment: last used Dec   Sexual activity: Yes    Birth control/protection: None  Other Topics Concern   Not on file  Social History Narrative   Not on file   Social Drivers of Health   Financial Resource Strain: Low Risk  (04/21/2023)   Overall Financial Resource Strain (CARDIA)    Difficulty of Paying Living Expenses: Not hard at all  Food Insecurity: No Food Insecurity (04/27/2023)   Hunger Vital Sign    Worried About Running Out of Food in the Last Year: Never true    Ran Out of Food in the Last Year: Never true  Recent Concern: Food Insecurity - Food Insecurity Present (04/21/2023)   Hunger Vital Sign    Worried About  Running Out of Food in the Last Year: Never true    Ran Out of Food in the Last Year: Sometimes true  Transportation Needs: No Transportation Needs (04/27/2023)   PRAPARE - Administrator, Civil Service (Medical): No    Lack of Transportation (Non-Medical): No  Physical Activity: Sufficiently Active (04/21/2023)   Exercise Vital Sign    Days of Exercise  per Week: 4 days    Minutes of Exercise per Session: 150+ min  Stress: No Stress Concern Present (04/21/2023)   [employer] of Occupational Health - Occupational Stress Questionnaire    Feeling of Stress : Only a little  Social Connections: Moderately Isolated (04/21/2023)   Social Connection and Isolation Panel    Frequency of Communication with Friends and Family: More than three times a week    Frequency of Social Gatherings with Friends and Family: More than three times a week    Attends Religious Services: 1 to 4 times per year    Active Member of [employer] or Organizations: No    Attends Engineer, structural: Not on file    Marital Status: Never married      Family History  Problem Relation Age of Onset   Stroke Mother    Hypertension Mother    Diabetes Mother    Hypertension Father    Diabetes Father    Colon cancer Father    Diabetes Sister    Hypertension Maternal Grandmother    Hypertension Paternal Grandmother    Diabetes Paternal Grandmother    Breast cancer Cousin       ROS:   Please see the history of present illness.     All other systems reviewed and are negative.   Labs/EKG Reviewed:    EKG:   EKG was  ordered today.  The ekg ordered today demonstrates sinus rhythm   Recent Labs: 09/19/2023: ALT 14; BUN 8; Creatinine, Ser 1.33; Hemoglobin 12.0; Magnesium  4.4; Platelets 164; Potassium 4.3; Sodium 134   Recent Lipid Panel Lab Results  Component Value Date/Time   CHOL 178 08/30/2021 10:18 PM   TRIG 57 08/30/2021 10:18 PM   HDL 48 08/30/2021 10:18 PM   CHOLHDL 3.7 08/30/2021  10:18 PM   LDLCALC 119 (H) 08/30/2021 10:18 PM   LDLCALC 134 (H) 11/01/2019 10:28 AM    Physical Exam:    VS:  BP (!) 168/78 (BP Location: Left Arm, Patient Position: Sitting, Cuff Size: Large)   Pulse 86   Ht 5' 6 (1.676 m)   Wt (!) 425 lb 9.6 oz (193.1 kg)   LMP 01/03/2023   SpO2 94%   BMI 68.69 kg/m     Wt Readings from Last 3 Encounters:  09/17/23 (!) 426 lb 12.8 oz (193.6 kg)  09/16/23 (!) 425 lb 9.6 oz (193.1 kg)  09/11/23 (!) 419 lb 8 oz (190.3 kg)     GEN:  Well nourished, well developed in no acute distress HEENT: Normal NECK: No JVD; No carotid bruits LYMPHATICS: No lymphadenopathy CARDIAC: RRR, no murmurs, rubs, gallops RESPIRATORY:  Clear to auscultation without rales, wheezing or rhonchi  ABDOMEN: Soft, non-tender, non-distended MUSCULOSKELETAL:  No edema; No deformity  SKIN: Warm and dry NEUROLOGIC:  Alert and oriented x 3 PSYCHIATRIC:  Normal affect    Risk Assessment/Risk Calculators:                  ASSESSMENT & PLAN:    Hypertension in pregnancy- Gestational hypertension with recent medication adjustment. Anxiety and palpitations attributed to gestational hypertension and induction anxiety. - Increase hydralazine  to 50 mg twice daily. - Adjust medication post-delivery as blood pressure may increase postpartum.   Patient Instructions  Medication Instructions:  Your physician has recommended you make the following change in your medication:  INCREASE: Hydralazine  50 mg three times  *If you need a refill on your cardiac medications before your next appointment, please call your  pharmacy*  Follow-Up: At [facility], you and your health needs are our priority.  As part of our continuing mission to provide you with exceptional heart care, our providers are all part of one team.  This team includes your primary Cardiologist (physician) and Advanced Practice Providers or APPs (Physician Assistants and Nurse Practitioners) who all work  together to provide you with the care you need, when you need it.  Your next appointment:   Sept 11th at 9:20am  Provider:   Zalan Shidler, DO      Dispo:  No follow-ups on file.   Medication Adjustments/Labs and Tests Ordered: Current medicines are reviewed at length with the patient today.  Concerns regarding medicines are outlined above.  Tests Ordered: No orders of the defined types were placed in this encounter.  Medication Changes: Meds ordered this encounter  Medications   hydrALAZINE  (APRESOLINE ) 50 MG tablet    Sig: Take 1 tablet (50 mg total) by mouth 3 (three) times daily.    Dispense:  270 tablet    Refill:  1   Blood Pressure Monitoring (BLOOD PRESSURE MONITOR AUTOMAT) DEVI    Sig: Use as directed.    Dispense:  1 each    Refill:  0

## 2023-09-19 NOTE — Progress Notes (Signed)
 OBRRN at bedside for fundal and PP bleeding assessment. Fundus U/E and firm. Additional 150cc of bleeding in canister since approximately 1500. VSS, pt remains intubated and sedated at this time.

## 2023-09-19 NOTE — Plan of Care (Signed)
   Problem: Clinical Measurements: Goal: Will remain free from infection Outcome: Progressing Goal: Diagnostic test results will improve Outcome: Progressing Goal: Respiratory complications will improve Outcome: Progressing Goal: Cardiovascular complication will be avoided Outcome: Progressing   Problem: Safety: Goal: Ability to remain free from injury will improve Outcome: Progressing

## 2023-09-19 NOTE — Progress Notes (Addendum)
 Spoke with patient about anesthesia plan for cesarean section.  Since last night, patient has been short of breath with O2 desaturations to the 70s%. Has been on nonrebreather O2, and now BiPAP, which she seems to be tolerating.  CXR shows right sided opacities which the radiologist read as pneumonia, however there are no other clinical signs for this (fever, tachycardia, cough). Patient has been getting lasix  to attempt diuresis to optimize respiratory status prior to cesarean.  I told patient the options of utilizing her in-situ epidural for surgical anesthesia vs general with ETT. I advised that if she were able to tolerate laying somewhat flat for the procedure, we'd be able to bring the BiPAP into the c/s suite and hopefully utilize the epidural. It is currently unclear how well the epidural is working (patient' says she feels when contractions come on, but is unable to elucidate whether they are improved from pre-epidural). Pain scores in general have been lower sine epidural placement. I explained the benefits, including potential avoidance of an ETT, which if placed, would be likely difficult to remove immediately in the OR. I did discuss the risks of GETA too, including nausea, vomiting, sore throat, damage to lips/tongue/eyes/oropharynx, and rare complications such as cardiac, respiratory, and neurological damage. Patient counseled on being higher risk for anesthesia due to comorbidities: very elevated BMI, hypoxemic respiratory failure. Patient was told about increased risk of cardiac and respiratory events, including death.  Can consider ordering transthoracic echocardiogram to rule out cardiac etiology of respiratory decline; I relayed this consideration to the [facility] team.

## 2023-09-19 NOTE — Progress Notes (Signed)
 Labor Progress Note Jenniah Bhavsar is a 33 y.o. G1P0 at [redacted]w[redacted]d presented for IOL due to SIPE T2DM, obesity.  S: Resting comfortably. No concerns  O:  BP 136/78   Pulse 88   Temp 98.1 F (36.7 C) (Oral)   Resp 17   Ht 5' 6 (1.676 m)   Wt (!) 193.6 kg   LMP 01/03/2023   SpO2 93%   BMI 68.89 kg/m  EFM: 125/moderate variability/accels (-), decels (-)  CVE: Dilation: 4.5 Effacement (%): 60 Cervical Position: Posterior Station: -2 Presentation: Vertex Exam by:: Makhia Vosler, MD   A&P: 33 y.o. G1P0 [redacted]w[redacted]d  #Labor: Cervix unchanged from previous. IUPC in place. Currently on 3 of pitocin , FHR tolerating well. Will continue 1x1, minimal contractions every ~15 minutes.  #Pain: Epidural in place #FWB: Cat I tracing #GBS negative #SIPE: Last BP 136/78, continue antihypertensive regimen.   Sung [place]enton LITTIE Angles, MD 2:30 AM

## 2023-09-19 NOTE — Progress Notes (Signed)
 Labor progress note  In to reassess patient. She is now s/p 2 doses of Lasix  40mg . RRT called, Dr. Erik discussed case with CCM and anesthesia.   Patient tachypneic, satting low 90s on 15L nonrebreather. Discussed BiPAP w RRT RN, who reached out to RT, RT en route with BiPAP. Repeat CXR ordered. RROB discussed mag gtt with Dr. Erik, who recommended discontinuing at this time.   At this juncture, I discussed with patient that if she has not made any cervical progress, our team would recommend proceeding with cesarean delivery given her worsening pre-eclampsia and her being remote from delivery. Patient and her family understanding of this.   Laura Benson has agreed to proceed with cesarean section due to worsening pre-eclampsia with severe features, now with pulmonary edema on BiPAP, remote from delivery. The risks of surgery were discussed with the patient including but were not limited to: bleeding which may require transfusion or reoperation; infection which may require antibiotics; injury to bowel, bladder, ureters or other surrounding organs; injury to the fetus; need for additional procedures including hysterectomy in the event of a life-threatening hemorrhage; formation of adhesions; placental abnormalities wth subsequent pregnancies; incisional problems; thromboembolic phenomenon and other postoperative/anesthesia complications. The patient concurred with the proposed plan, giving informed written consent for the procedure. Patient has been NPO for 12 hours she will remain NPO for procedure. Anesthesia and OR aware. Preoperative prophylactic antibiotics and SCDs ordered on call to the OR. To OR when ready.    Alain Sor, MD OB Fellow, Faculty Practice [facility], Center for [facility]

## 2023-09-19 NOTE — Significant Event (Signed)
 Rapid Response Event Note   Reason for Call :  Pulmonary edema  Initial Focused Assessment:  Patient is lying in bed with head of bed elevated.  Her breathing is rapid and shallow. Crackles in bases.  Heart tones regular. She is warm and dry  BP 148/80  HR 108  RR 55  O2 sat 92-94% on NRB at 15L   Interventions:  Placed on Bipap IVF decreased to [facility] Additional dose of Lasix  IV  Preparing for urgent c section per OB   Reassessment:  0800:  Pt is tolerating Bipap well BP 147/89  HR 108  RR 40   Plan of Care:  CBG q 1 hour because adjustment to IVF and endo tool protocol adjusting insulin  gtt     Event Summary:   MD Notified: Dr Von PILA MD at bedside.  Dr Boone Anesthesia came to bedside.  Ronnald NP & Dr Harold came to bedside Call Time: 0715 Arrival Time: 9281 End Time: 0820  Elvin Portland, RN

## 2023-09-19 NOTE — Progress Notes (Signed)
 Seen in follow up post op Remains intubated Sounds like was a difficult airway    P CXR VAP pulm hygiene RASS 0 / -1 Repeat lasix  this afternoon Trach asp    Ronnald Gave MSN, AGACNP-BC [facility] Pulmonary/Critical Care Medicine Amion for pager  09/19/2023, 12:00 PM

## 2023-09-20 DIAGNOSIS — J9601 Acute respiratory failure with hypoxia: Secondary | ICD-10-CM | POA: Diagnosis not present

## 2023-09-20 DIAGNOSIS — D62 Acute posthemorrhagic anemia: Secondary | ICD-10-CM

## 2023-09-20 DIAGNOSIS — O10919 Unspecified pre-existing hypertension complicating pregnancy, unspecified trimester: Secondary | ICD-10-CM

## 2023-09-20 DIAGNOSIS — N179 Acute kidney failure, unspecified: Secondary | ICD-10-CM

## 2023-09-20 DIAGNOSIS — J9602 Acute respiratory failure with hypercapnia: Secondary | ICD-10-CM

## 2023-09-20 DIAGNOSIS — Z3A37 37 weeks gestation of pregnancy: Secondary | ICD-10-CM

## 2023-09-20 LAB — POCT I-STAT 7, (LYTES, BLD GAS, ICA,H+H)
Acid-base deficit: 6 mmol/L — ABNORMAL HIGH (ref 0.0–2.0)
Bicarbonate: 23.2 mmol/L (ref 20.0–28.0)
Calcium, Ion: 1.13 mmol/L — ABNORMAL LOW (ref 1.15–1.40)
HCT: 35 % — ABNORMAL LOW (ref 36.0–46.0)
Hemoglobin: 11.9 g/dL — ABNORMAL LOW (ref 12.0–15.0)
O2 Saturation: 96 %
Patient temperature: 97
Potassium: 4.4 mmol/L (ref 3.5–5.1)
Sodium: 134 mmol/L — ABNORMAL LOW (ref 135–145)
TCO2: 25 mmol/L (ref 22–32)
pCO2 arterial: 59.8 mmHg — ABNORMAL HIGH (ref 32–48)
pH, Arterial: 7.192 — CL (ref 7.35–7.45)
pO2, Arterial: 95 mmHg (ref 83–108)

## 2023-09-20 LAB — COMPREHENSIVE METABOLIC PANEL WITH GFR
ALT: 11 U/L (ref 0–44)
ALT: 11 U/L (ref 0–44)
ALT: 11 U/L (ref 0–44)
AST: 22 U/L (ref 15–41)
AST: 21 U/L (ref 15–41)
AST: 23 U/L (ref 15–41)
Albumin: 1.9 g/dL — ABNORMAL LOW (ref 3.5–5.0)
Albumin: 2.2 g/dL — ABNORMAL LOW (ref 3.5–5.0)
Albumin: 1.9 g/dL — ABNORMAL LOW (ref 3.5–5.0)
Alkaline Phosphatase: 73 U/L (ref 38–126)
Alkaline Phosphatase: 78 U/L (ref 38–126)
Alkaline Phosphatase: 67 U/L (ref 38–126)
Anion gap: 6 (ref 5–15)
Anion gap: 9 (ref 5–15)
Anion gap: 10 (ref 5–15)
BUN: 9 mg/dL (ref 6–20)
BUN: 11 mg/dL (ref 6–20)
BUN: 13 mg/dL (ref 6–20)
CO2: 22 mmol/L (ref 22–32)
CO2: 19 mmol/L — ABNORMAL LOW (ref 22–32)
CO2: 22 mmol/L (ref 22–32)
Calcium: 7.6 mg/dL — ABNORMAL LOW (ref 8.9–10.3)
Calcium: 7.9 mg/dL — ABNORMAL LOW (ref 8.9–10.3)
Calcium: 7.8 mg/dL — ABNORMAL LOW (ref 8.9–10.3)
Chloride: 107 mmol/L (ref 98–111)
Chloride: 107 mmol/L (ref 98–111)
Chloride: 103 mmol/L (ref 98–111)
Creatinine, Ser: 1.43 mg/dL — ABNORMAL HIGH (ref 0.44–1.00)
Creatinine, Ser: 1.74 mg/dL — ABNORMAL HIGH (ref 0.44–1.00)
Creatinine, Ser: 1.73 mg/dL — ABNORMAL HIGH (ref 0.44–1.00)
GFR, Estimated: 50 mL/min — ABNORMAL LOW (ref >60)
GFR, Estimated: 39 mL/min — ABNORMAL LOW (ref >60)
GFR, Estimated: 40 mL/min — ABNORMAL LOW (ref >60)
Glucose, Bld: 106 mg/dL — ABNORMAL HIGH (ref 70–99)
Glucose, Bld: 102 mg/dL — ABNORMAL HIGH (ref 70–99)
Glucose, Bld: 134 mg/dL — ABNORMAL HIGH (ref 70–99)
Potassium: 4.4 mmol/L (ref 3.5–5.1)
Potassium: 4.6 mmol/L (ref 3.5–5.1)
Potassium: 3.9 mmol/L (ref 3.5–5.1)
Sodium: 135 mmol/L (ref 135–145)
Sodium: 135 mmol/L (ref 135–145)
Sodium: 135 mmol/L (ref 135–145)
Total Bilirubin: 0.7 mg/dL (ref 0.0–1.2)
Total Bilirubin: 0.5 mg/dL (ref 0.0–1.2)
Total Bilirubin: 0.6 mg/dL (ref 0.0–1.2)
Total Protein: 5 g/dL — ABNORMAL LOW (ref 6.5–8.1)
Total Protein: 5.4 g/dL — ABNORMAL LOW (ref 6.5–8.1)
Total Protein: 5 g/dL — ABNORMAL LOW (ref 6.5–8.1)

## 2023-09-20 LAB — GLUCOSE, CAPILLARY
Glucose-Capillary: 106 mg/dL — ABNORMAL HIGH (ref 70–99)
Glucose-Capillary: 98 mg/dL (ref 70–99)
Glucose-Capillary: 115 mg/dL — ABNORMAL HIGH (ref 70–99)
Glucose-Capillary: 107 mg/dL — ABNORMAL HIGH (ref 70–99)
Glucose-Capillary: 106 mg/dL — ABNORMAL HIGH (ref 70–99)
Glucose-Capillary: 94 mg/dL (ref 70–99)

## 2023-09-20 LAB — CBC WITH DIFFERENTIAL/PLATELET
Abs Immature Granulocytes: 0.08 K/uL — ABNORMAL HIGH (ref 0.00–0.07)
Basophils Absolute: 0 K/uL (ref 0.0–0.1)
Basophils Relative: 0 %
Eosinophils Absolute: 0.1 K/uL (ref 0.0–0.5)
Eosinophils Relative: 1 %
HCT: 28.7 % — ABNORMAL LOW (ref 36.0–46.0)
Hemoglobin: 9.1 g/dL — ABNORMAL LOW (ref 12.0–15.0)
Immature Granulocytes: 1 %
Lymphocytes Relative: 10 %
Lymphs Abs: 1.5 K/uL (ref 0.7–4.0)
MCH: 24.5 pg — ABNORMAL LOW (ref 26.0–34.0)
MCHC: 31.7 g/dL (ref 30.0–36.0)
MCV: 77.2 fL — ABNORMAL LOW (ref 80.0–100.0)
Monocytes Absolute: 1.4 K/uL — ABNORMAL HIGH (ref 0.1–1.0)
Monocytes Relative: 9 %
Neutro Abs: 11.7 K/uL — ABNORMAL HIGH (ref 1.7–7.7)
Neutrophils Relative %: 79 %
Platelets: 200 K/uL (ref 150–400)
RBC: 3.72 MIL/uL — ABNORMAL LOW (ref 3.87–5.11)
RDW: 14.9 % (ref 11.5–15.5)
WBC: 14.7 K/uL — ABNORMAL HIGH (ref 4.0–10.5)
nRBC: 0 % (ref 0.0–0.2)

## 2023-09-20 LAB — TRIGLYCERIDES: Triglycerides: 135 mg/dL (ref <150)

## 2023-09-20 MED ORDER — ACETAMINOPHEN 500 MG PO TABS
1000.0000 mg | ORAL_TABLET | Freq: Four times a day (QID) | ORAL | Status: DC
  Administered 2023-09-20: 1000 mg
  Filled 2023-09-20: qty 2

## 2023-09-20 MED ORDER — CYCLOBENZAPRINE HCL 10 MG PO TABS: 10.0000 mg | ORAL_TABLET | Freq: Three times a day (TID) | ORAL | Status: DC | PRN

## 2023-09-20 MED ORDER — GABAPENTIN 100 MG PO CAPS
100.0000 mg | ORAL_CAPSULE | Freq: Three times a day (TID) | ORAL | Status: DC
  Administered 2023-09-20 – 2023-09-25 (×15): 100 mg via ORAL
  Filled 2023-09-20 (×15): qty 1

## 2023-09-20 MED ORDER — SENNOSIDES-DOCUSATE SODIUM 8.6-50 MG PO TABS: 2.0000 | ORAL_TABLET | Freq: Every day | ORAL | Status: DC

## 2023-09-20 MED ORDER — KETOROLAC TROMETHAMINE 15 MG/ML IJ SOLN: 30.0000 mg | Freq: Four times a day (QID) | INTRAMUSCULAR | Status: DC

## 2023-09-20 MED ORDER — SERTRALINE HCL 50 MG PO TABS
50.0000 mg | ORAL_TABLET | Freq: Every day | ORAL | Status: DC
  Administered 2023-09-21: 50 mg via ORAL
  Filled 2023-09-20: qty 1

## 2023-09-20 MED ORDER — FERROUS SULFATE 325 (65 FE) MG PO TABS: 325.0000 mg | ORAL_TABLET | ORAL | Status: DC

## 2023-09-20 MED ORDER — ORAL CARE MOUTH RINSE
15.0000 mL | OROMUCOSAL | Status: DC | PRN
Start: 2023-09-20 — End: 2023-09-23

## 2023-09-20 MED ORDER — OXYCODONE HCL 5 MG PO TABS
5.0000 mg | ORAL_TABLET | ORAL | Status: DC | PRN
  Administered 2023-09-20 – 2023-09-22 (×5): 10 mg via ORAL
  Administered 2023-09-22: 5 mg via ORAL
  Administered 2023-09-23 (×2): 10 mg via ORAL
  Administered 2023-09-24 (×2): 5 mg via ORAL
  Filled 2023-09-20: qty 1
  Filled 2023-09-20 (×5): qty 2
  Filled 2023-09-20 (×2): qty 1
  Filled 2023-09-20 (×2): qty 2

## 2023-09-20 MED ORDER — IBUPROFEN 200 MG PO TABS: 600.0000 mg | ORAL_TABLET | Freq: Four times a day (QID) | ORAL | Status: DC

## 2023-09-20 MED ORDER — SIMETHICONE 80 MG PO CHEW: 80.0000 mg | CHEWABLE_TABLET | ORAL | Status: DC | PRN

## 2023-09-20 MED ORDER — SERTRALINE HCL 50 MG PO TABS: 50.0000 mg | ORAL_TABLET | Freq: Every day | ORAL | Status: DC

## 2023-09-20 MED ORDER — DEXMEDETOMIDINE HCL IN NACL 400 MCG/100ML IV SOLN
0.0000 ug/kg/h | INTRAVENOUS | Status: DC
  Administered 2023-09-20: 0.4 ug/kg/h via INTRAVENOUS
  Filled 2023-09-20 (×2): qty 100

## 2023-09-20 MED ORDER — COCONUT OIL OIL
1.0000 | TOPICAL_OIL | Status: DC | PRN
Start: 2023-09-20 — End: 2023-09-26

## 2023-09-20 MED ORDER — SIMETHICONE 80 MG PO CHEW
80.0000 mg | CHEWABLE_TABLET | Freq: Three times a day (TID) | ORAL | Status: DC
  Administered 2023-09-20: 80 mg
  Filled 2023-09-20: qty 1

## 2023-09-20 MED ORDER — LABETALOL HCL 200 MG PO TABS
200.0000 mg | ORAL_TABLET | Freq: Three times a day (TID) | ORAL | Status: DC
  Administered 2023-09-20 – 2023-09-21 (×2): 200 mg via ORAL
  Filled 2023-09-20 (×2): qty 1

## 2023-09-20 MED ORDER — KETOROLAC TROMETHAMINE 15 MG/ML IJ SOLN
30.0000 mg | Freq: Four times a day (QID) | INTRAMUSCULAR | Status: DC
  Administered 2023-09-20 (×2): 30 mg via INTRAVENOUS
  Filled 2023-09-20 (×2): qty 2

## 2023-09-20 MED ORDER — FUROSEMIDE 10 MG/ML IJ SOLN
80.0000 mg | Freq: Once | INTRAMUSCULAR | Status: AC
  Administered 2023-09-20: 80 mg via INTRAVENOUS
  Filled 2023-09-20: qty 8

## 2023-09-20 MED ORDER — DIBUCAINE (PERIANAL) 1 % EX OINT: 1.0000 | TOPICAL_OINTMENT | CUTANEOUS | Status: DC | PRN

## 2023-09-20 MED ORDER — ACETAMINOPHEN 500 MG PO TABS
1000.0000 mg | ORAL_TABLET | Freq: Four times a day (QID) | ORAL | Status: DC
  Administered 2023-09-20 – 2023-09-25 (×18): 1000 mg via ORAL
  Filled 2023-09-20 (×22): qty 2

## 2023-09-20 MED ORDER — DOCUSATE SODIUM 100 MG PO CAPS
100.0000 mg | ORAL_CAPSULE | Freq: Two times a day (BID) | ORAL | Status: DC
  Filled 2023-09-20: qty 1

## 2023-09-20 MED ORDER — DOCUSATE SODIUM 50 MG/5ML PO LIQD
100.0000 mg | Freq: Two times a day (BID) | ORAL | Status: DC
  Filled 2023-09-20: qty 10

## 2023-09-20 MED ORDER — WITCH HAZEL-GLYCERIN EX PADS
1.0000 | MEDICATED_PAD | CUTANEOUS | Status: DC | PRN
Start: 2023-09-20 — End: 2023-09-26

## 2023-09-20 MED ORDER — PRENATAL MULTIVITAMIN CH
1.0000 | ORAL_TABLET | Freq: Every day | ORAL | Status: DC
  Filled 2023-09-20: qty 1

## 2023-09-20 MED ORDER — AMLODIPINE BESYLATE 5 MG PO TABS
10.0000 mg | ORAL_TABLET | Freq: Every day | ORAL | Status: DC
  Administered 2023-09-21 – 2023-09-25 (×5): 10 mg via ORAL
  Filled 2023-09-20 (×5): qty 2

## 2023-09-20 MED ORDER — ENOXAPARIN SODIUM 100 MG/ML IJ SOSY
90.0000 mg | PREFILLED_SYRINGE | INTRAMUSCULAR | Status: DC
  Administered 2023-09-21 – 2023-09-24 (×4): 90 mg via SUBCUTANEOUS
  Filled 2023-09-20 (×6): qty 0.9

## 2023-09-20 MED ORDER — OXYTOCIN-SODIUM CHLORIDE 30-0.9 UT/500ML-% IV SOLN
2.5000 [IU]/h | INTRAVENOUS | Status: DC
  Filled 2023-09-20: qty 500

## 2023-09-20 MED ORDER — OXYCODONE HCL 5 MG PO TABS: 5.0000 mg | ORAL_TABLET | ORAL | Status: DC | PRN

## 2023-09-20 MED ORDER — GABAPENTIN 250 MG/5ML PO SOLN
100.0000 mg | Freq: Three times a day (TID) | ORAL | Status: DC
  Filled 2023-09-20 (×3): qty 2

## 2023-09-20 MED ORDER — MENTHOL 3 MG MT LOZG
1.0000 | LOZENGE | OROMUCOSAL | Status: DC | PRN
  Administered 2023-09-22 – 2023-09-24 (×4): 3 mg via ORAL
  Filled 2023-09-20 (×2): qty 9

## 2023-09-20 MED ORDER — SIMETHICONE 80 MG PO CHEW
80.0000 mg | CHEWABLE_TABLET | ORAL | Status: DC | PRN
  Administered 2023-09-20 – 2023-09-21 (×2): 80 mg via ORAL
  Filled 2023-09-20 (×2): qty 1

## 2023-09-20 MED ORDER — ACETAMINOPHEN 500 MG PO TABS: 1000.0000 mg | ORAL_TABLET | Freq: Four times a day (QID) | ORAL | Status: DC

## 2023-09-20 MED ORDER — SIMETHICONE 80 MG PO CHEW: 80.0000 mg | CHEWABLE_TABLET | Freq: Three times a day (TID) | ORAL | Status: DC

## 2023-09-20 MED ORDER — GABAPENTIN 100 MG PO CAPS: 100.0000 mg | ORAL_CAPSULE | Freq: Three times a day (TID) | ORAL | Status: DC

## 2023-09-20 MED ORDER — PRENATAL MULTIVITAMIN CH
1.0000 | ORAL_TABLET | Freq: Every day | ORAL | Status: DC
  Administered 2023-09-21 – 2023-09-25 (×5): 1 via ORAL
  Filled 2023-09-20 (×5): qty 1

## 2023-09-20 MED ORDER — ORAL CARE MOUTH RINSE: 15.0000 mL | OROMUCOSAL | Status: DC

## 2023-09-20 MED ORDER — SENNOSIDES-DOCUSATE SODIUM 8.6-50 MG PO TABS
2.0000 | ORAL_TABLET | Freq: Every day | ORAL | Status: DC
  Administered 2023-09-21 – 2023-09-25 (×5): 2 via ORAL
  Filled 2023-09-20 (×5): qty 2

## 2023-09-20 MED ORDER — AMLODIPINE BESYLATE 10 MG PO TABS: 10.0000 mg | ORAL_TABLET | Freq: Every day | ORAL | Status: DC

## 2023-09-20 MED ORDER — POLYETHYLENE GLYCOL 3350 17 G PO PACK
17.0000 g | PACK | Freq: Every day | ORAL | Status: DC
  Administered 2023-09-21 – 2023-09-22 (×2): 17 g via ORAL
  Filled 2023-09-20 (×2): qty 1

## 2023-09-20 MED ORDER — PRENATAL MULTIVITAMIN CH: 1.0000 | ORAL_TABLET | Freq: Every day | ORAL | Status: DC

## 2023-09-20 MED ORDER — SIMETHICONE 80 MG PO CHEW
80.0000 mg | CHEWABLE_TABLET | Freq: Three times a day (TID) | ORAL | Status: DC
  Administered 2023-09-21 – 2023-09-25 (×14): 80 mg via ORAL
  Filled 2023-09-20 (×14): qty 1

## 2023-09-20 NOTE — Progress Notes (Signed)
 Progress Note  Patient transferred from ICU to [facility].  POD1 s/p pLTCS for SIPE w/ SF (BP, pulm edema, Cr) with acute respiratory failure remote from delivery  On arrival, she is alert, oriented and in no distress. She is conversant and reports feeling well. She is looking forward to seeing her baby.  HR 90s, normal rhythm. Mild range BP SpO2 94% on 4LNC, respirations are non-labored. RN reports mild upper airway sounds but lungs otherwise clear to auscultation.  Abdomen is soft and non tender. Prevena in place, clean/dry/intact. Small clot noted by nursing on admission but appropriate for POD1  Most recent labs reviewed - Cr stable at 1.73, lytes WNL.   A/P: - SIPE w/ SF: no mag given pulmonary edema. Wean O2 as tolerated, IS q4h. Anticipate rising BP over next few days, will resume labetalol  at 200 TID and amlodipine  tomorrow AM. Holding hydral for now but can resume prn. Cr stable, will space labs to daily unless clinical status changes. Pending AM Cr, will likely add PO lasix  40mg  daily x 5d PP. Continue strict I/Os. Echo ordered for tomorrow morning to r/o PP cardiomyopathy - T2DM: CBG q4h until tol regular diet. SSI & lantus  5u daily ordered. F/u DM coordinator recs - Post op: will keep foley overnight but once pt OOB to bathroom tomorrow AM it can be removed. Continue strict I/Os for remainder of admission. CBC in AM. PO iron  ordered  Kieth Carolin, MD Obstetrician & Gynecologist, [facility] for [facility], [facility]

## 2023-09-20 NOTE — Inpatient Diabetes Management (Signed)
 Inpatient Diabetes Program Recommendations  AACE/ADA: New Consensus Statement on Inpatient Glycemic Control   Target Ranges:  Prepandial:   less than 140 mg/dL      Peak postprandial:   less than 180 mg/dL (1-2 hours)      Critically ill patients:  140 - 180 mg/dL    Latest Reference Range & Units 09/19/23 09:12 09/19/23 12:20 09/20/23 15:11 09/19/23 17:09 09/19/23 20:16 09/19/23 23:41 09/20/23 04:09  Glucose-Capillary 70 - 99 mg/dL 894 (H) 829 (H)  Novolog  4 units     Lantus  5 units 127 (H)  Novolog  3 units 113 (H) 113 (H) 106 (H)    Latest Reference Range & Units 09/17/23 14:15 09/18/23 02:20 09/19/23 08:02  Glucose-Capillary 70 - 99 mg/dL 98 889 (H) 899 (H)   Review of Glycemic Control  Diabetes history: DM2 (dx 2020; hx of gastric sleeve 2022), 36W Outpatient Diabetes medications: Omnipod 0.5 units/hr, I:CR 1:11 grams, I:SF 1:70 mg/dl Prior to Pregnancy: Ozempic  Qwk Lantus  16 units every day (prior to gastric sleeve) Current orders for Inpatient glycemic control: Lantus  5 units daily, Novolog  0-20 units Q4H  Inpatient Diabetes Program Recommendations:    Insulin : Agree with current insulin  orders.   NOTE: Noted consult for Diabetes Coordinator. Diabetes Coordinator is not on campus over the weekend but available by pager from 8am to 5pm for questions or concerns. Patient had c-section on 09/19/23 and remained intubated due to respiratory distress likely from pulmonary edema. IV insulin  drip was stopped prior to going to OR on 09/19/23 and patient was transitioned to SQ insulin .  Agree with insulin  orders. Will follow along and make further recommendations if needed based on glucose data.  Thanks, Earnie Gainer, RN, MSN, CDCES Diabetes Coordinator Inpatient Diabetes Program [phone] (Team Pager from 8am to 5pm)

## 2023-09-20 NOTE — Progress Notes (Signed)
 Subjective: Postpartum Day 1: Cesarean Delivery Patient reports intubated but interactive and appropriate JADA removed with minimal blood in the vaulat.    Objective: Vital signs in last 24 hours: Temp:  [97.6 F (36.4 C)-98.6 F (37 C)] 98.6 F (37 C) (08/24 0823) Pulse Rate:  [84-109] 101 (08/24 0700) Resp:  [17-33] 26 (08/24 0700) BP: (100-146)/(56-95) 100/63 (08/24 0700) SpO2:  [95 %-100 %] 98 % (08/24 0700) FiO2 (%):  [40 %-50 %] 40 % (08/24 0831)  Physical Exam:  General: alert, cooperative, and no distress Lochia: appropriate Uterine Fundus: unsure, can't palpate Incision: healing well DVT Evaluation: No evidence of DVT seen on physical exam.  Recent Labs    09/19/23 1219 09/20/23 0853  HGB 11.9* 9.1*  HCT 35.0* 28.7*    Assessment/Plan: Status post Cesarean section.   Encouraged aggressive diuresis with CC team Routine post op OB care Trying to wean off vent today, then likely transfer to [facility]  Care discussed with Denys Roads RN and questions answered.  Vonn VEAR Inch, MD 09/20/2023, 11:36 AM

## 2023-09-20 NOTE — Anesthesia Postprocedure Evaluation (Signed)
 Anesthesia Post Note  Patient: Laura Benson  Procedure(s) Performed: CESAREAN DELIVERY (Abdomen) INSERTION, INTRAUTERINE DEVICE     Patient location during evaluation: SICU Anesthesia Type: General Level of consciousness: sedated Pain management: pain level controlled Vital Signs Assessment: post-procedure vital signs reviewed and stable Respiratory status: patient remains intubated per anesthesia plan Cardiovascular status: stable Postop Assessment: no apparent nausea or vomiting Anesthetic complications: yes   Encounter Notable Events  Notable Event Outcome Phase Comment  Difficult to intubate - expected  Intraprocedure Filed from anesthesia note documentation.    Last Vitals:  Vitals:   09/20/23 1800 09/20/23 1900  BP: 129/60 119/72  Pulse: 83 88  Resp: (!) 25 18  Temp:    SpO2: 98% 96%    Last Pain:  Vitals:   09/20/23 1715  TempSrc:   PainSc: 0-No pain                 Cordella P Breahna Boylen

## 2023-09-20 NOTE — Plan of Care (Signed)

## 2023-09-20 NOTE — Progress Notes (Signed)
 No bleeding around the JADA. Pt's pad is clean and dry. Fundus is U/E and firm. There is a small amount of blood in the tubing connected to the JADA, but the canister is empty.

## 2023-09-20 NOTE — Progress Notes (Signed)
 NAME:  Laura Benson, MRN:  [id number], DOB:  19-Jul-1990, LOS: 3 ADMISSION DATE:  09/17/2023, CONSULTATION DATE:  09/19/23 REFERRING MD:  Erik - OB, CHIEF COMPLAINT:  resp distress    History of Present Illness:  33 yo F G1P0 [redacted]w[redacted]d w difficult to control chronic HTN (rcvd report of sev pre-eclampsia but in review of outpt notes it looks like this is chronic severe HTN and has not had features of sev pre-eclampsia) DM2, OSA, morbid obesity who presented for IOL 09/17/23. Requiring insulin  gtt for glucose management.  Epidural placed 8/22. Developed SOB 8/23 early AM.  CXR w R sided patchy opacity. Had progressive O2 req 8/23 morning req NRB, was given IV lasix  40 x 2 with concern for pulmonary edema and started on BiPAP after rapid response.   PCCM consulted in this setting    Pertinent  Medical History  Obesity  OSA Pre-eclampsia DM2  HTN   Significant Hospital Events: Including procedures, antibiotic start and stop dates in addition to other pertinent events   8/21 presented for IOL. Insulin  gtt 8/22 epidural  8/23 new O2 req, progressive SOB, NRB --> BiPAP. Diuresing. PCCM consult   Interim History / Subjective:  Patient underwent C-section, tolerated well, FiO2 titrated down to 40% Insulin  infusion was stopped Tolerating spontaneous breathing trial  Objective    Blood pressure 100/63, pulse (!) 101, temperature 98.6 F (37 C), temperature source Axillary, resp. rate (!) 26, height 5' 6 (1.676 m), weight (!) 193.6 kg, last menstrual period 01/03/2023, SpO2 98%, unknown if currently breastfeeding.    Vent Mode: PRVC FiO2 (%):  [40 %-50 %] 40 % Set Rate:  [15 bmp-24 bmp] 24 bmp Vt Set:  [470 mL] 470 mL PEEP:  [5 cmH20-8 cmH20] 5 cmH20 Pressure Support:  [8 cmH20] 8 cmH20 Plateau Pressure:  [16 cmH20-26 cmH20] 16 cmH20   Intake/Output Summary (Last 24 hours) at 09/20/2023 1142 Last data filed at 09/20/2023 1130 Gross per 24 hour  Intake 2382.03 ml  Output 3379 ml  Net  -996.97 ml   Filed Weights   09/17/23 1117  Weight: (!) 193.6 kg    Examination: General: Crtitically ill-appearing young morbidly obese [place], orally intubated HEENT: /AT, eyes anicteric.  ETT and cortrak in place Neuro: Opens eyes with vocal stimuli, following simple commands Chest: Tachypneic, coarse breath sounds, no wheezes or rhonchi Heart: Regular rate and rhythm, no murmurs or gallops Abdomen: Soft, nondistended, bowel sounds present  Labs and images reviewed  Patient Lines/Drains/Airways Status     Active Line/Drains/Airways     Name Placement date Placement time Site Days   Peripheral IV 09/17/23 20 G 2.5 Left;Upper Arm 09/17/23  1332  Arm  3   Peripheral IV 09/17/23 20 G 1.88 Left;Anterior Forearm 09/17/23  1333  Forearm  3   Peripheral IV 09/19/23 14 G  Anterior;Proximal;Right Forearm 09/19/23  0830  Forearm  1   Negative Pressure Wound Therapy Abdomen Lower;Medial 09/19/23  1118  --  1   Urethral Catheter Tinnie Champ RN 14 Fr. 09/18/23  1615  --  2   Airway 7.5 mm 09/19/23  1017  -- 1   Wound 09/19/23 0902 Surgical Closed Surgical Incision Vagina 09/19/23  0902  Vagina  1   Wound 09/19/23 0902 Surgical Closed Surgical Incision Abdomen 09/19/23  0902  Abdomen  1        Resolved problem list   Assessment and Plan  Acute resp failure w hypoxia and hypercapnia Acute pulmonary edema due to  uncontrolled hypertension OSA Obesity  Continue nonproductive ventilation FiO2 was titrated down to 40% Patient is tolerating spontaneous breathing trial, will try to extubate her on BiPAP Continue diuresis She should use BiPAP every night Diet and excise counseling as appropriate  G1P0 [redacted]w[redacted]d status post C-section Acute blood loss anemia, perioperative, expected Severe chronic HTN Patient underwent C-section yesterday, tolerated well Monitor H&H and transfuse if less than 8 Blood pressure is better controlled Continue antihypertensive meds  IDDM  Patient has  insulin  pump at home Insulin  infusion was transition to long-acting insulin  Off IV fluid She can go back on insulin  pump at discharge  Acute kidney injury Serum creatinine trended up, monitor intake and output Avoid nephrotoxic agent Repeat BMP in the morning   Labs   CBC: Recent Labs  Lab 09/17/23 1343 09/18/23 1014 09/18/23 1844 09/19/23 0411 09/19/23 1219 09/19/23 1632 09/20/23 0853  WBC 5.4 7.3 7.0 8.5  --   --  14.7*  NEUTROABS  --   --   --   --   --   --  11.7*  HGB 10.8* 11.6* 11.7* 12.0 11.9*  --  9.1*  HCT 33.9* 36.8 37.5 37.7 35.0*  --  28.7*  MCV 76.2* 75.4* 76.2* 75.9*  --   --  77.2*  PLT 256 235 245 249  --  164 200    Basic Metabolic Panel: Recent Labs  Lab 09/19/23 0411 09/19/23 0805 09/19/23 1310 09/19/23 1632 09/19/23 1800 09/20/23 0513 09/20/23 0853  NA 134*   < > QUANTITY NOT SUFFICIENT, UNABLE TO PERFORM TEST 134* 135 135 135  K 4.0   < > QUANTITY NOT SUFFICIENT, UNABLE TO PERFORM TEST 4.3 4.3 4.4 4.6  CL 107   < > QUANTITY NOT SUFFICIENT, UNABLE TO PERFORM TEST 105 105 107 107  CO2 20*   < > QUANTITY NOT SUFFICIENT, UNABLE TO PERFORM TEST 20* 20* 22 19*  GLUCOSE 100*   < > 122* 130* 124* 106* 102*  BUN 7   < > 8 8 8 9 11   CREATININE 1.00   < > 1.25* 1.33* 1.32* 1.43* 1.74*  CALCIUM 8.2*   < > QUANTITY NOT SUFFICIENT, UNABLE TO PERFORM TEST 7.9* 8.0* 7.6* 7.9*  MG 4.4*  --   --   --   --   --   --    < > = values in this interval not displayed.   GFR: Estimated Creatinine Clearance: 82 mL/min (A) (by C-G formula based on SCr of 1.74 mg/dL (H)). Recent Labs  Lab 09/18/23 1014 09/18/23 1844 09/19/23 0411 09/19/23 1632 09/20/23 0853  PROCALCITON  --   --   --  0.24  --   WBC 7.3 7.0 8.5  --  14.7*    Liver Function Tests: Recent Labs  Lab 09/19/23 1310 09/19/23 1632 09/19/23 1800 09/20/23 0513 09/20/23 0853  AST QUANTITY NOT SUFFICIENT, UNABLE TO PERFORM TEST 26 27 22 21   ALT QUANTITY NOT SUFFICIENT, UNABLE TO PERFORM TEST 14  14 11 11   ALKPHOS QUANTITY NOT SUFFICIENT, UNABLE TO PERFORM TEST 84 85 73 78  BILITOT 0.0 0.4 0.7 0.7 0.5  PROT 5.7* 5.5* 5.5* 5.0* 5.4*  ALBUMIN 2.6* 2.2* 2.3* 1.9* 2.2*   No results for input(s): LIPASE, AMYLASE in the last 168 hours. No results for input(s): AMMONIA in the last 168 hours.  ABG    Component Value Date/Time   PHART 7.192 (LL) 09/19/2023 1219   PCO2ART 59.8 (H) 09/19/2023 1219   PO2ART 95  09/19/2023 1219   HCO3 23.2 09/19/2023 1219   TCO2 25 09/19/2023 1219   ACIDBASEDEF 6.0 (H) 09/19/2023 1219   O2SAT 96 09/19/2023 1219     Coagulation Profile: Recent Labs  Lab 09/19/23 1632  INR 1.1    Cardiac Enzymes: No results for input(s): CKTOTAL, CKMB, CKMBINDEX, TROPONINI in the last 168 hours.  HbA1C: Hgb A1c MFr Bld  Date/Time Value Ref Range Status  03/24/2023 03:58 PM 6.2 (H) 4.8 - 5.6 % Final    Comment:             Prediabetes: 5.7 - 6.4          Diabetes: >6.4          Glycemic control for adults with diabetes: <7.0   09/01/2021 06:58 AM 6.1 (H) 4.8 - 5.6 % Final    Comment:    (NOTE) Pre diabetes:          5.7%-6.4%  Diabetes:              >6.4%  Glycemic control for   <7.0% adults with diabetes     CBG: Recent Labs  Lab 09/19/23 2016 09/19/23 2341 09/20/23 0409 09/20/23 0818 09/20/23 1128  GLUCAP 113* 113* 106* 98 115*   The patient is critically ill due to acute respiratory failure with hypoxia and hypercapnia/AKI.  Critical care was necessary to treat or prevent imminent or life-threatening deterioration.  Critical care was time spent personally by me on the following activities: development of treatment plan with patient and/or surrogate as well as nursing, discussions with consultants, evaluation of patient's response to treatment, examination of patient, obtaining history from patient or surrogate, ordering and performing treatments and interventions, ordering and review of laboratory studies, ordering and review of  radiographic studies, pulse oximetry, re-evaluation of patient's condition and participation in multidisciplinary rounds.   During this encounter critical care time was devoted to patient care services described in this note for 39 minutes.     Valinda Novas, MD [facility] See Amion for pager If no response to pager, please call [phone] until 7pm After 7pm, Please call E-link [phone]

## 2023-09-20 NOTE — Lactation Note (Signed)
 This note was copied from a baby's chart. Lactation Consultation Note  Patient Name: Boy Lillee Mooneyhan Unijb'd Date: 09/20/2023 Age:34 hours Reason for consult: Follow-up assessment;Primapara;1st time breastfeeding;Early term 37-38.6wks;Infant < 5lbs;Maternal endocrine disorder  P1- Infant was born at [redacted]w[redacted]d GA weighing 2220g and has had a total weight loss of 3.21%. MOB's feeding plan is both breast milk and formula. Infant is still in the room 405 with FOB only. MOB is admitted to room 3M11. FOB and his visitors informed LC that MOB had been extubated today. FOB feels like MOB is not up to pumping yet, but he would like the [facility] team to check in with her RN later tonight to see if MOB would be feeling up to a consult for pumping. MOB feels like infant is feeding well with the Nfant gold slow flow nipple. FOB does not feel like infant has started cluster feeding yet, but he verbalized understanding of following infant's hunger cues. LC noticed that FOB had regular 360 formula in the room. LC confirmed with the RN that infant needs Neosure, so LC provided the correct formula. LC encouraged FOB to call for further assistance as needed.  Maternal Data Has patient been taught Hand Expression?: No Does the patient have breastfeeding experience prior to this delivery?: No  Feeding Mother's Current Feeding Choice: Breast Milk and Formula  Interventions Interventions: Breast feeding basics reviewed;Education;Pace feeding;LC Services brochure;LPT handout/interventions  Discharge Discharge Education: Warning signs for feeding baby (Education to FOB only)  Consult Status Consult Status: Follow-up Date: 09/21/23 Follow-up type: In-patient    Recardo Hoit BS, IBCLC 09/20/2023, 5:08 PM

## 2023-09-20 NOTE — Procedures (Signed)
 Extubation Procedure Note  Patient Details:   Name: Elzena Muston DOB: Oct 04, 1990 MRN: [id number]   Airway Documentation:    Vent end date: (not recorded) Vent end time: (not recorded)   Evaluation  O2 sats: stable throughout Complications: No apparent complications Patient did tolerate procedure well. Bilateral Breath Sounds: Diminished   Yes Pt was extubated to 4L [place] and is tolerating well at this time. Audible cuffleak heard prior extubation with Vte loss. No signs of stridor at this time. Pt is able to state name and location.Pt able to protect airway.  Germain JAYSON Mater 09/20/2023, 2:43 PM

## 2023-09-20 NOTE — Progress Notes (Signed)
 OBRRN at bedside for fundal and PP bleeding assessment. Fundus firm @U /E. Canister emptied at previous fundal assessment, no further bleeding to canister- minimal blood in suction tubing. Pt remains intubated, did wince and wiggle toes for fundal assessment.

## 2023-09-21 ENCOUNTER — Inpatient Hospital Stay (HOSPITAL_COMMUNITY)

## 2023-09-21 DIAGNOSIS — R0602 Shortness of breath: Secondary | ICD-10-CM

## 2023-09-21 LAB — TYPE AND SCREEN
ABO/RH(D): O POS
Antibody Screen: NEGATIVE
Unit division: 0
Unit division: 0
Unit division: 0
Unit division: 0

## 2023-09-21 LAB — BPAM RBC
Blood Product Expiration Date: 202508282359
Blood Product Expiration Date: 202508282359
Blood Product Expiration Date: 202509062359
Blood Product Expiration Date: 202509062359
ISSUE DATE / TIME: 202508070910
ISSUE DATE / TIME: 202508070910
ISSUE DATE / TIME: 202508230122
ISSUE DATE / TIME: 202508230122
Unit Type and Rh: 5100
Unit Type and Rh: 5100
Unit Type and Rh: 9500
Unit Type and Rh: 9500

## 2023-09-21 LAB — COMPREHENSIVE METABOLIC PANEL WITH GFR
ALT: 11 U/L (ref 0–44)
ALT: 12 U/L (ref 0–44)
AST: 22 U/L (ref 15–41)
AST: 24 U/L (ref 15–41)
Albumin: 1.8 g/dL — ABNORMAL LOW (ref 3.5–5.0)
Albumin: 2.1 g/dL — ABNORMAL LOW (ref 3.5–5.0)
Alkaline Phosphatase: 64 U/L (ref 38–126)
Alkaline Phosphatase: 66 U/L (ref 38–126)
Anion gap: 5 (ref 5–15)
Anion gap: 9 (ref 5–15)
BUN: 12 mg/dL (ref 6–20)
BUN: 9 mg/dL (ref 6–20)
CO2: 24 mmol/L (ref 22–32)
CO2: 24 mmol/L (ref 22–32)
Calcium: 7.7 mg/dL — ABNORMAL LOW (ref 8.9–10.3)
Calcium: 8.2 mg/dL — ABNORMAL LOW (ref 8.9–10.3)
Chloride: 109 mmol/L (ref 98–111)
Chloride: 107 mmol/L (ref 98–111)
Creatinine, Ser: 1.37 mg/dL — ABNORMAL HIGH (ref 0.44–1.00)
Creatinine, Ser: 1.1 mg/dL — ABNORMAL HIGH (ref 0.44–1.00)
GFR, Estimated: 52 mL/min — ABNORMAL LOW (ref >60)
GFR, Estimated: >60 mL/min (ref >60)
Glucose, Bld: 116 mg/dL — ABNORMAL HIGH (ref 70–99)
Glucose, Bld: 149 mg/dL — ABNORMAL HIGH (ref 70–99)
Potassium: 3.7 mmol/L (ref 3.5–5.1)
Potassium: 3.9 mmol/L (ref 3.5–5.1)
Sodium: 138 mmol/L (ref 135–145)
Sodium: 140 mmol/L (ref 135–145)
Total Bilirubin: 0.5 mg/dL (ref 0.0–1.2)
Total Bilirubin: 0.6 mg/dL (ref 0.0–1.2)
Total Protein: 4.9 g/dL — ABNORMAL LOW (ref 6.5–8.1)
Total Protein: 5.4 g/dL — ABNORMAL LOW (ref 6.5–8.1)

## 2023-09-21 LAB — ECHOCARDIOGRAM COMPLETE
Area-P 1/2: 4.3 cm2
Calc EF: 65.9 %
Height: 66 in
S' Lateral: 2.9 cm
Single Plane A2C EF: 66.8 %
Single Plane A4C EF: 68.3 %
Weight: 6828.8 [oz_av]

## 2023-09-21 LAB — CBC
HCT: 22.3 % — ABNORMAL LOW (ref 36.0–46.0)
Hemoglobin: 7.2 g/dL — ABNORMAL LOW (ref 12.0–15.0)
MCH: 24.7 pg — ABNORMAL LOW (ref 26.0–34.0)
MCHC: 32.3 g/dL (ref 30.0–36.0)
MCV: 76.4 fL — ABNORMAL LOW (ref 80.0–100.0)
Platelets: 183 K/uL (ref 150–400)
RBC: 2.92 MIL/uL — ABNORMAL LOW (ref 3.87–5.11)
RDW: 14.6 % (ref 11.5–15.5)
WBC: 10.2 K/uL (ref 4.0–10.5)
nRBC: 0 % (ref 0.0–0.2)

## 2023-09-21 LAB — CULTURE, RESPIRATORY W GRAM STAIN: Culture: NORMAL

## 2023-09-21 LAB — HEMOGLOBIN A1C
Hgb A1c MFr Bld: 6.3 % — ABNORMAL HIGH (ref 4.8–5.6)
Mean Plasma Glucose: 134.11 mg/dL

## 2023-09-21 LAB — GLUCOSE, CAPILLARY
Glucose-Capillary: 128 mg/dL — ABNORMAL HIGH (ref 70–99)
Glucose-Capillary: 88 mg/dL (ref 70–99)
Glucose-Capillary: 106 mg/dL — ABNORMAL HIGH (ref 70–99)
Glucose-Capillary: 134 mg/dL — ABNORMAL HIGH (ref 70–99)
Glucose-Capillary: 136 mg/dL — ABNORMAL HIGH (ref 70–99)

## 2023-09-21 MED ORDER — IRON SUCROSE 500 MG IVPB - SIMPLE MED
500.0000 mg | Freq: Once | INTRAVENOUS | Status: DC
  Filled 2023-09-21: qty 275

## 2023-09-21 MED ORDER — INSULIN ASPART 100 UNIT/ML IJ SOLN: 0.0000 [IU] | Freq: Every day | INTRAMUSCULAR | Status: DC

## 2023-09-21 MED ORDER — BUPROPION HCL ER (XL) 150 MG PO TB24
150.0000 mg | ORAL_TABLET | Freq: Every day | ORAL | Status: DC
  Administered 2023-09-21 – 2023-09-25 (×5): 150 mg via ORAL
  Filled 2023-09-21 (×6): qty 1

## 2023-09-21 MED ORDER — SODIUM CHLORIDE 0.9 % IV SOLN
500.0000 mg | Freq: Once | INTRAVENOUS | Status: AC
  Administered 2023-09-21: 500 mg via INTRAVENOUS
  Filled 2023-09-21: qty 25

## 2023-09-21 MED ORDER — SODIUM CHLORIDE 0.9 % IV SOLN: 300.0000 mg | Freq: Once | INTRAVENOUS | Status: DC

## 2023-09-21 MED ORDER — POTASSIUM CHLORIDE CRYS ER 20 MEQ PO TBCR
40.0000 meq | EXTENDED_RELEASE_TABLET | Freq: Every day | ORAL | Status: DC
  Administered 2023-09-21 – 2023-09-22 (×2): 40 meq via ORAL
  Filled 2023-09-21 (×2): qty 2

## 2023-09-21 MED ORDER — FUROSEMIDE 10 MG/ML IJ SOLN
40.0000 mg | Freq: Every day | INTRAMUSCULAR | Status: AC
  Administered 2023-09-21 – 2023-09-22 (×2): 40 mg via INTRAVENOUS
  Filled 2023-09-21 (×2): qty 4

## 2023-09-21 MED ORDER — INSULIN ASPART 100 UNIT/ML IJ SOLN: 0.0000 [IU] | Freq: Three times a day (TID) | INTRAMUSCULAR | Status: DC

## 2023-09-21 MED ORDER — POTASSIUM CHLORIDE CRYS ER 20 MEQ PO TBCR: 20.0000 meq | EXTENDED_RELEASE_TABLET | Freq: Every day | ORAL | Status: DC

## 2023-09-21 MED ORDER — LABETALOL HCL 200 MG PO TABS
200.0000 mg | ORAL_TABLET | Freq: Two times a day (BID) | ORAL | Status: DC
  Administered 2023-09-21 – 2023-09-22 (×2): 200 mg via ORAL
  Filled 2023-09-21 (×2): qty 1

## 2023-09-21 MED ORDER — FUROSEMIDE 40 MG PO TABS: 40.0000 mg | ORAL_TABLET | Freq: Every day | ORAL | Status: DC

## 2023-09-21 MED ORDER — INSULIN ASPART 100 UNIT/ML IJ SOLN
0.0000 [IU] | Freq: Three times a day (TID) | INTRAMUSCULAR | Status: DC
  Administered 2023-09-21 – 2023-09-25 (×2): 1 [IU] via SUBCUTANEOUS

## 2023-09-21 MED ORDER — PANTOPRAZOLE SODIUM 40 MG PO TBEC
40.0000 mg | DELAYED_RELEASE_TABLET | Freq: Every day | ORAL | Status: DC
  Administered 2023-09-21 – 2023-09-25 (×5): 40 mg via ORAL
  Filled 2023-09-21 (×5): qty 1

## 2023-09-21 MED ORDER — HYDRALAZINE HCL 50 MG PO TABS
50.0000 mg | ORAL_TABLET | Freq: Two times a day (BID) | ORAL | Status: DC
  Administered 2023-09-21 (×2): 50 mg via ORAL
  Filled 2023-09-21 (×2): qty 1

## 2023-09-21 NOTE — Clinical Social Work Maternal (Signed)
 CLINICAL SOCIAL WORK MATERNAL/CHILD NOTE  Patient Details  Name: Laura Benson MRN: [id number] Date of Birth: Jul 03, 1990  Date:  09/21/2023  Clinical Social Worker Initiating Note:  Nat Quiet, [place] Date/Time: Initiated:  09/21/23/1200     Child's Name:  Laura Benson   Biological Parents:  Mother, Father (MOB: Laura Benson 07[birth year]14[birth year]89, FOB: Laura Benson 08/06/1988)   Need for Interpreter:  None   Reason for Referral:  Behavioral Health Concerns   Address:  [address] [place] [address]    Phone number:  [phone] (home)     Additional phone number:   Household Members/Support Persons (HM/SP):   Household Member/Support Person 1   HM/SP Name Relationship DOB or Age  HM/SP -1 Bobby Caine Father    HM/SP -2        HM/SP -3        HM/SP -4        HM/SP -5        HM/SP -6        HM/SP -7        HM/SP -8          Natural Supports (not living in the home):  Immediate Family, Teacher, music Supports: None   Employment: Environmental education officer   Type of Work: Engineer, manufacturing systems:  [employer] school graduate   Homebound arranged:    Surveyor, quantity Resources:  Media planner    Other Resources:  [employer], Sales executive     Cultural/Religious Considerations Which May Impact Care:    Strengths:  Ability to meet basic needs  , Home prepared for child  , Psychotropic Medications, Pediatrician chosen   Psychotropic Medications:  Zoloft       Pediatrician:    Armed forces operational officer area  Pediatrician List:   [facility]  [place]    [place]    [place]      Pediatrician Fax Number:    Risk Factors/Current Problems:  Mental Health Concerns     Cognitive State:  Able to Concentrate  , Alert     Mood/Affect:  Calm  , Interested  , Happy     CSW Assessment: CSW received consult for hx anx/MDD with psychotic features, pt in ICU d/t complex medical needs. CSW met with MOB to  offer support and complete assessment.   CSW met with MOB at bedside and introduced CSW role. MOB appeared calm, pleasant and engaged during the visit. FOB was present at bedside and holding their infant. CSW offered MOB privacy and FOB left the room to allow MOB privacy. MOB confirmed that her demographic information on hospital file was correct and stated that she lived with her father. She reported that FOB lives outside of the home and has been involved in caring for their son. MOB expressed feeling proud of FOB for caring for their son while she was admitted in the ICU. CSW inquired how MOB had been feeling. MOB expressed that she felt "traumatized" by recent medical events that led to her being admitted to the ICU. MOB shared that she had complications after her c-section due her history of complication after surgery. She expressed that she could have died and feels grateful to be alive. MOB shared that she continues to have difficultly with breathing and requires oxygen, but her symptoms are slowly improving. MOB shared that yesterday evening was the first time she got to bond with her "miracle baby", sharing  that he had previously been told that she could not have children. CSW provided active listening and validated MOB's feelings. CSW inquired about MOB's support system. She identified her father, FOB and sisters as her primary supports.  CSW inquired about MOB's noted mental health history. MOB acknowledged that she has a history of anxiety, depression with psychosis, and PTSD. She shared that she is familiar with [facility] behavioral health services due to having a history of breakdowns and that she feels comfortable asking for help when she is  going through a phase. CSW assessed further. MOB expressed during these phases she feels depressed and isolates herself and does not talk to others. MOB expressed feeling depresses and anxious during the pregnancy. CSW reported that she is prescribed  Zoloft  for symptoms but feel the medication has not been as effective as Wellbutrin . She reported that had not spoken with her provider about her medication. CSW offered to notify her RN and provider regarding her concern about her medication. MOB expressed appreciation. MOB reported that she had seen IBH counselor during her pregnancy for two session, which she found helpful and requested to resume services is possible. CSW offered to send a referral to the [facility] counselor to inquire about resuming services. MOB was very receptive. MOB expressed that she utilizes her coping strategies such as reading, listening and sleeping. CSW acknowledged these strengths and encouraged her to continue implementing them.   CSW provided education regarding the baby blues period vs. perinatal mood disorders, discussed treatment and gave additional resources for mental health follow up if concerns arise. CSW recommended MOB complete a self-evaluation during the postpartum time period using the New Mom Checklist from Postpartum Progress and encouraged MOB to contact a medical professional if symptoms are noted at any time. CSW assessed MOB for additional needs. MOB reported none. CSW assessed MOB for safety. MOB denied SI, HI and denied domestic violence concerns.  CSW inquired if MOB received WIC/SNAP benefits. MOB reported that she receives both [place]  and WIC benefits. CSW inquired if MOB had essential items to care for her infant. MOB reported that she has all essential items including a crib and car seat. CSW provided review of Sudden Infant Death Syndrome (SIDS) precautions. MOB reported taking a class that reviewed SIDS precautions and verbalized understanding. CSW inquired if MOB was connected to any community supports. MOB reported that she was connected with a few programs but could not recall their names. CSW informed MOB about [facility] for community support during the postpartum period and offered to make a referral.  MOB expressed interest in their services, and a Adult nurse signed her up for services.  CSW followed up with RN regarding MOB's mental health medication. CSW made a referral to [facility] counselor.  CSW identifies no further need for intervention and no barriers to discharge at this time.  CSW Plan/Description:  Perinatal Mood and Anxiety Disorder (PMADs) Education, Sudden Infant Death Syndrome (SIDS) Education, No Further Intervention Required/No Barriers to Discharge, Other Information/Referral to [employer], LCSW 09/21/2023, 2:32 PM

## 2023-09-21 NOTE — Lactation Note (Signed)
 This note was copied from a baby's chart. Lactation Consultation Note  Patient Name: Laura Benson Unijb'd Date: 09/21/2023 Age:34 hours Reason for consult: Maternal endocrine disorder;Follow-up assessment;Primapara;1st time breastfeeding;Early term 37-38.6wks;Infant < 5lbs;Infant weight loss (5 % weight loss. Per mom 1st time she has seen a LC . LC mentioned to mom the LC had seen dad per the [facility] note.) Per mom desires to breast feed, at present baby is sound asleep and its only been 2 hours since the baby last fed.  LC discussed potential feeding behaviors with a ET , less than 5 pound infant and their recommended feeding schedule.  Feed with cues and by 3 hours, if with the 3 IV's ( 1 is running fluids, and 2 are in saline locks in the forearm.  mom has in her arms and attempt to latch, call for assistance , if not just pump,  LC had walked down to the nurses station, and the  PT had come to obtain the nurse caring for the patient so it wasn't a good time to set her up with the DEBP .  Mom will need assist to latch and pump. LC will provide report to the night LC.   Maternal Data Does the patient have breastfeeding experience prior to this delivery?: No  Feeding Mother's Current Feeding Choice: Breast Milk and Formula Nipple Type: Nfant Extra Slow Flow (gold)    Lactation Tools Discussed/Used Tools:  (LC was in process of obtaining a DEBP to be set up and the Phyical therapist and the nurse were in the hallway planning to go to get her up. Since the PT has limited hours in the tower . LC allowed her to go 1st will have  patient F/U later this evening.)  Interventions  Education   Discharge Pump: DEBP;Personal  Consult Status Consult Status: Follow-up Date: 09/21/23 Follow-up type: In-patient    Laura Benson Laura Benson 09/21/2023, 5:37 PM

## 2023-09-21 NOTE — Progress Notes (Addendum)
 Daily Postpartum Note  Admission Date: 09/17/2023 Current Date: 09/21/2023 9:04 AM  Laura Benson is a 33 y.o. G1P1001 POD#2 s/p primary low transverse c/s (prevena in place) at [redacted]w[redacted]d for failed induction of labor for CHTN/DM2.  Peripartum course c/b for developing superimposed severe pre-eclampsia (pulmonary edema, AKI), need for intubation  Pregnancy complicated by: Patient Active Problem List   Diagnosis Date Noted   Respiratory distress 09/19/2023   Type 2 diabetes mellitus during pregnancy 09/17/2023   Gestational diabetes mellitus (GDM) requiring insulin  06/08/2023   Poor fetal growth affecting management of mother in second trimester 05/25/2023   Alpha thalassemia silent carrier 04/08/2023   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   Supervision of high risk pregnancy, antepartum 03/17/2023   Morbid obesity (HCC) 03/17/2023   MDD (major depressive disorder), recurrent, severe, with psychosis (HCC) 08/31/2021   GAD (generalized anxiety disorder) 10/23/2020   Complication of anesthesia 09/04/2020   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   Lumbar radiculopathy 08/17/2019   Uncontrolled type 2 diabetes mellitus with hyperglycemia (HCC) 01/18/2019   Asthma 06/08/2018   Umbilical hernia without obstruction and without gangrene 02/25/2017   Obstructive sleep apnea 12/11/2016   Chronic hypertension in pregnancy 01/23/2016   Depression, recurrent (HCC) 01/05/2009   Severe obesity (BMI >= 40) (HCC) 03/26/2006    Overnight/24hr events:  Patient transferred from SICU to [facility] specialty care  Subjective:  Feeling better, no overt cardiac or respiratory s/s.   Objective:    Current Vital Signs 24h Vital Sign Ranges  T 99.2 F (37.3 C) Temp  Avg: 99.2 F (37.3 C)  Min: 98.8 F (37.1 C)  Max: 99.8 F (37.7 C)  BP (!) 110/50 BP  Min: 109/68  Max: 147/88  HR (!) 102 Pulse  Avg: 94  Min: 83  Max: 115  RR 19 Resp  Avg: 25.1  Min: 14  Max: 40  SaO2 96 % Nasal Cannula SpO2  Avg:  96.2 %  Min: 93 %  Max: 99 %       24 Hour I/O Current Shift I/O  Time Ins Outs 08/24 0701 - 08/25 0700 In: 349.8 [I.V.:299.8] Out: 2350 [Urine:2350] 08/25 0701 - 08/25 1900 In: -  Out: 125 [Urine:125]   Patient Vitals for the past 24 hrs:  BP Temp Temp src Pulse Resp SpO2  09/21/23 0738 (!) 110/50 99.2 F (37.3 C) Oral (!) 102 19 96 %  09/21/23 0637 133/63 -- -- (!) 109 -- 96 %  09/21/23 0630 -- -- -- -- -- 93 %  09/21/23 0503 -- -- -- -- -- 96 %  09/21/23 0502 -- -- -- -- -- 96 %  09/21/23 0500 -- -- -- -- -- 96 %  09/21/23 0400 -- -- -- -- -- 97 %  09/21/23 0352 (!) 120/54 99.2 F (37.3 C) Oral (!) 115 (!) 26 95 %  09/21/23 0000 -- -- -- -- -- 98 %  09/20/23 2323 128/62 98.9 F (37.2 C) Oral 98 (!) 22 94 %  09/20/23 2300 -- -- -- -- -- 97 %  09/20/23 2215 -- -- -- -- -- 95 %  09/20/23 2140 (!) 134/57 -- -- (!) 102 (!) 22 94 %  09/20/23 2100 (!) 142/75 -- -- 93 (!) 29 94 %  09/20/23 2007 -- 99.8 F (37.7 C) Oral 93 (!) 22 95 %  09/20/23 2000 (!) 128/54 -- -- 85 (!) 28 95 %  09/20/23 1900 119/72 -- -- 88 18 96 %  09/20/23 1800 129/60 -- --  83 (!) 25 98 %  09/20/23 1700 123/64 -- -- 86 (!) 25 97 %  09/20/23 1630 123/61 -- -- 87 17 97 %  09/20/23 1600 -- -- -- 84 14 95 %  09/20/23 1500 -- -- -- 89 (!) 25 95 %  09/20/23 1441 -- -- -- -- -- 96 %  09/20/23 1400 109/68 -- -- 87 (!) 25 99 %  09/20/23 1300 115/69 -- -- 89 (!) 34 99 %  09/20/23 1200 115/74 -- -- 87 (!) 40 99 %  09/20/23 1128 -- 98.8 F (37.1 C) Axillary -- -- --  09/20/23 1100 116/68 -- -- 99 (!) 32 98 %  09/20/23 1000 (!) 147/88 -- -- (!) 110 (!) 29 98 %    Physical exam: General: Well nourished, well developed female in no acute distress. Abdomen: obese, nttp, rare bowel sounds, prevena in place and functioning wnl Cardiovascular: S1, S2 normal, no murmur, rub or gallop, regular rate and rhythm Respiratory: CTAB Skin: Warm and dry.   Medications: Current Facility-Administered Medications  Medication  Dose Route Frequency Provider Last Rate Last Admin   0.9 %  sodium chloride  infusion (Manually program via Guardrails IV Fluids)   Intravenous Once Kumar, Agnijita, MD       acetaminophen  (TYLENOL ) tablet 1,000 mg  1,000 mg Oral Q6H Cleatus Moccasin, MD   1,000 mg at 09/21/23 9366   albuterol  (PROVENTIL ) (2.5 MG/3ML) 0.083% nebulizer solution 2.5 mg  2.5 mg Nebulization Q4H PRN Cashion, Colter L, MD   2.5 mg at 09/19/23 9686   amLODipine  (NORVASC ) tablet 10 mg  10 mg Oral Daily Cleatus Moccasin, MD       Chlorhexidine  Gluconate Cloth 2 % PADS 6 each  6 each Topical Daily Cleatus Moccasin, MD   6 each at 09/20/23 0110   coconut oil  1 Application Topical PRN Sehgal, Sanchala, MD       cyclobenzaprine  (FLEXERIL ) tablet 10 mg  10 mg Oral TID PRN Cleatus Moccasin, MD       witch hazel-glycerin  (TUCKS) pad 1 Application  1 Application Topical PRN Sehgal, Sanchala, MD       And   dibucaine (NUPERCAINAL) 1 % rectal ointment 1 Application  1 Application Rectal PRN Sehgal, Sanchala, MD       enoxaparin  (LOVENOX ) injection 90 mg  90 mg Subcutaneous Q24H Sehgal, Sanchala, MD       furosemide  (LASIX ) tablet 40 mg  40 mg Oral Daily Erik Felts C, MD       gabapentin  (NEURONTIN ) capsule 100 mg  100 mg Oral TID Cleatus Moccasin, MD   100 mg at 09/20/23 2221   labetalol  (NORMODYNE ) injection 20 mg  20 mg Intravenous PRN Nicholaus Almarie HERO, MD   20 mg at 09/19/23 9573   And   labetalol  (NORMODYNE ) injection 40 mg  40 mg Intravenous PRN Nicholaus Almarie HERO, MD       And   labetalol  (NORMODYNE ) injection 80 mg  80 mg Intravenous PRN Nicholaus Almarie HERO, MD       And   hydrALAZINE  (APRESOLINE ) injection 10 mg  10 mg Intravenous PRN Nicholaus Almarie HERO, MD       hydrALAZINE  (APRESOLINE ) tablet 50 mg  50 mg Oral Q12H Izell Harari, MD       insulin  aspart (novoLOG ) injection 0-20 Units  0-20 Units Subcutaneous TID WC Izell Harari, MD       insulin  aspart (novoLOG ) injection 0-5 Units  0-5 Units Subcutaneous QHS Izell Harari, MD  iron  sucrose (VENOFER ) 500 mg in sodium chloride  0.9 % 250 mL IVPB  500 mg Intravenous Once Cleatus Moccasin, MD 68.8 mL/hr at 09/21/23 0810 500 mg at 09/21/23 0810   labetalol  (NORMODYNE ) tablet 200 mg  200 mg Oral Q12H Izell Harari, MD       menthol -cetylpyridinium (CEPACOL) lozenge 3 mg  1 lozenge Oral Q2H PRN Sehgal, Sanchala, MD       Oral care mouth rinse  15 mL Mouth Rinse PRN Cleatus Moccasin, MD       Oral care mouth rinse  15 mL Mouth Rinse 4 times per day Cleatus Moccasin, MD       Oral care mouth rinse  15 mL Mouth Rinse PRN Cleatus Moccasin, MD       oxyCODONE  (Oxy IR/ROXICODONE ) immediate release tablet 5-10 mg  5-10 mg Oral Q4H PRN Cleatus Moccasin, MD   10 mg at 09/21/23 0759   pantoprazole  (PROTONIX ) injection 40 mg  40 mg Intravenous Daily Daren Ronnald BRAVO, NP   40 mg at 09/20/23 1024   polyethylene glycol (MIRALAX  / GLYCOLAX ) packet 17 g  17 g Oral Daily Cleatus Moccasin, MD       potassium chloride  SA (KLOR-CON  M) CR tablet 40 mEq  40 mEq Oral Daily Izell Harari, MD       prenatal multivitamin tablet 1 tablet  1 tablet Oral Q1200 Cleatus Moccasin, MD       senna-docusate (Senokot-S) tablet 2 tablet  2 tablet Oral Daily Cleatus Moccasin, MD       sertraline  (ZOLOFT ) tablet 50 mg  50 mg Oral Daily Cleatus Moccasin, MD       simethicone  (MYLICON) chewable tablet 80 mg  80 mg Oral TID PC Cleatus Moccasin, MD   80 mg at 09/21/23 0800   simethicone  (MYLICON) chewable tablet 80 mg  80 mg Oral PRN Cleatus Moccasin, MD   80 mg at 09/21/23 0403   Labs:  Recent Labs  Lab 09/19/23 0411 09/19/23 1219 09/19/23 1632 09/20/23 0853 09/21/23 0424  WBC 8.5  --   --  14.7* 10.2  HGB 12.0 11.9*  --  9.1* 7.2*  HCT 37.7 35.0*  --  28.7* 22.3*  PLT 249  --  164 200 183   Recent Labs  Lab 09/20/23 0853 09/20/23 1850 09/21/23 0424  NA 135 135 138  K 4.6 3.9 3.7  CL 107 103 109  CO2 19* 22 24  BUN 11 13 12   CREATININE 1.74* 1.73* 1.37*  CALCIUM 7.9* 7.8* 7.7*  PROT 5.4* 5.0* 4.9*   BILITOT 0.5 0.6 0.5  ALKPHOS 78 67 64  ALT 11 11 11   AST 21 23 22   GLUCOSE 102* 134* 116*   Radiology:  No new imaging  Assessment & Plan:  Patient stable *Postpartum: foley currently in. Will try and ambulate and remove later today. -O POS/boy, ask about circ tomorrow/rpr neg/birth control: TBD/breast planned *CV: echo ordered. Patient on norvasc  10 qday, hydral 50 tid, labetalol  300 tid. Will touch base with Dr. Sheena re: regimen. May need to change off labetalol  given her h/o pulmonary edema and how she does today, respiratory-wise. No events on cardiac monitoring except slight sinus tachycardia.  *Pulm: see above. RT consulted to help wean off oxygen; pt currently on 4L [place]. Pt states she was supposed to be on but wasn't using cpap at home and does not have a machine at home *DM2: DM diet ordered with ACHS checks with SSI. DM team consulted *Anemia: IV iron  currently  infusing *History of sleeve: no current issues *BMI 69: PT consulted. Will likely need to go home on 3-4wks of ppx lovenox . Prevena in place.  *PPx: lovenox , OOB, PPI *FEN/GI: SLIV, DM diet *Dispo: likely pod#4-5  Bebe Izell Raddle MD Attending Center for [facility] (Faculty Practice) GYN Consult Phone: [phone] (M-F, 0800-1700) & [phone]  (Off hours, weekends, holidays)

## 2023-09-21 NOTE — Progress Notes (Signed)
 OB Note Patient states she feels stable. RT to come by tonight to set up cpap. Pt stable on 4L [place]. Has put out large volume UOP with lasix . Cmp wnl at 1500. Plan to keep foley due to feeling dizzy and ligtheaded with PT today. F/u am labs and may need blood in AM  Bebe Izell Raddle MD Attending Center for [employer] (Faculty Practice) 09/21/2023 Time: [phone]

## 2023-09-21 NOTE — Evaluation (Signed)
 Physical Therapy Evaluation Patient Details Name: Laura Benson MRN: [id number] DOB: 1991/01/13 Today's Date: 09/21/2023  History of Present Illness  Pt is 33 year old presented to [facility] on  09/17/23 for induction of labor due to uncontrolled HTN. Pt developed pulmonary edema and AKI and underwent csection on 09/19/23. Remainined intubated until 09/20/23. PMH - IDDM, severe obesity BMI 69, OSA, PCOS, HTN, asthma.  Clinical Impression  Pt admitted with above diagnosis and presents to PT with functional limitations due to deficits listed below (See PT problem list). Pt needs skilled PT to maximize independence and safety. Prior to hospitalization pt mobilizing independently. Currently limited by pain and weakness after csection and complications. She was able to stand at bedside but got dizzy so we didn't go any further. Hopeful that she will progress rapidly in the next couple of days and be able to return home with family. If progress is slow may need to look at other options.           If plan is discharge home, recommend the following: A little help with walking and/or transfers;A little help with bathing/dressing/bathroom;Assist for transportation;Help with stairs or ramp for entrance;Assistance with cooking/housework   Can travel by private Tax inspector (2 wheels)  Recommendations for Other Services       Functional Status Assessment Patient has had a recent decline in their functional status and demonstrates the ability to make significant improvements in function in a reasonable and predictable amount of time.     Precautions / Restrictions Precautions Precautions: Fall;Other (comment) Precaution/Restrictions Comments: wound VAC Restrictions Weight Bearing Restrictions Per Provider Order: No      Mobility  Bed Mobility Overal bed mobility: Needs Assistance Bed Mobility: Supine to Sit, Sit to Supine     Supine to sit: Min assist,  HOB elevated, Used rails Sit to supine: Contact guard assist, HOB elevated, Used rails   General bed mobility comments: Assist to move RLE off of bed and to elevate trunk into sitting. Pt place HOB way up for both.    Transfers Overall transfer level: Needs assistance Equipment used: Rolling walker (2 wheels) Transfers: Sit to/from Stand Sit to Stand: Min assist, +2 physical assistance           General transfer comment: Assist to power up and incr time.    Ambulation/Gait             Pre-gait activities: Stood at bedside x 1 minute with CGA to maintain General Gait Details: Pt became dizzy in standing and returned to sitting  Stairs            Wheelchair Mobility     Tilt Bed    Modified Rankin (Stroke Patients Only)       Balance Overall balance assessment: Needs assistance Sitting-balance support: No upper extremity supported, Feet supported Sitting balance-Leahy Scale: Fair     Standing balance support: Bilateral upper extremity supported, During functional activity Standing balance-Leahy Scale: Poor Standing balance comment: UE support                             Pertinent Vitals/Pain Pain Assessment Pain Assessment: Faces Faces Pain Scale: Hurts even more Pain Location: abdomen Pain Descriptors / Indicators: Burning, Grimacing Pain Intervention(s): Limited activity within patient's tolerance, Monitored during session, Repositioned    Home Living Family/patient expects to be discharged to:: Private residence Living Arrangements: Parent (father)  Available Help at Discharge: Family;Available 24 hours/day Type of Home: House Home Access: Stairs to enter Entrance Stairs-Rails: Doctor, general practice of Steps: 4   Home Layout: One level Home Equipment: None      Prior Function Prior Level of Function : Independent/Modified Independent                     Extremity/Trunk Assessment   Upper Extremity  Assessment Upper Extremity Assessment: Overall WFL for tasks assessed    Lower Extremity Assessment Lower Extremity Assessment: Generalized weakness    Cervical / Trunk Assessment Cervical / Trunk Assessment: Other exceptions (large body habitus)  Communication   Communication Communication: No apparent difficulties    Cognition Arousal: Alert Behavior During Therapy: WFL for tasks assessed/performed   PT - Cognitive impairments: No apparent impairments                         Following commands: Intact       Cueing Cueing Techniques: Verbal cues     General Comments General comments (skin integrity, edema, etc.): VSS on @L     Exercises     Assessment/Plan    PT Assessment Patient needs continued PT services  PT Problem List Decreased strength;Decreased balance;Decreased mobility;Decreased activity tolerance;Obesity;Pain       PT Treatment Interventions DME instruction;Gait training;Stair training;Functional mobility training;Therapeutic activities;Therapeutic exercise;Patient/family education    PT Goals (Current goals can be found in the Care Plan section)  Acute Rehab PT Goals Patient Stated Goal: return home with baby PT Goal Formulation: With patient Time For Goal Achievement: 10/05/23 Potential to Achieve Goals: Good    Frequency Min 3X/week     Co-evaluation               AM-PAC PT 6 Clicks Mobility  Outcome Measure Help needed turning from your back to your side while in a flat bed without using bedrails?: A Little Help needed moving from lying on your back to sitting on the side of a flat bed without using bedrails?: A Little Help needed moving to and from a bed to a chair (including a wheelchair)?: Total Help needed standing up from a chair using your arms (e.g., wheelchair or bedside chair)?: Total Help needed to walk in hospital room?: Total Help needed climbing 3-5 steps with a railing? : Total 6 Click Score: 10    End  of Session Equipment Utilized During Treatment: Oxygen Activity Tolerance: Other (comment) (Limited by dizziness) Patient left: in bed;with call bell/phone within reach;with family/visitor present Nurse Communication: Mobility status (Nursing assisted with) PT Visit Diagnosis: Other abnormalities of gait and mobility (R26.89);Muscle weakness (generalized) (M62.81);Difficulty in walking, not elsewhere classified (R26.2);Pain Pain - part of body:  (abdomen)    Time: 8740-8663 PT Time Calculation (min) (ACUTE ONLY): 37 min   Charges:   PT Evaluation $PT Eval Moderate Complexity: 1 Mod PT Treatments $Therapeutic Activity: 8-22 mins PT General Charges $$ ACUTE PT VISIT: 1 Visit         [facility] PT Acute Rehabilitation Services Office [phone]   Rodgers ORN [facility] 09/21/2023, 5:12 PM

## 2023-09-21 NOTE — Inpatient Diabetes Management (Signed)
 Inpatient Diabetes Program Recommendations  AACE/ADA: New Consensus Statement on Inpatient Glycemic Control (2015)  Target Ranges:  Prepandial:   less than 140 mg/dL      Peak postprandial:   less than 180 mg/dL (1-2 hours)      Critically ill patients:  140 - 180 mg/dL   Lab Results  Component Value Date   GLUCAP 88 09/21/2023   HGBA1C 6.2 (H) 03/24/2023    Latest Reference Range & Units 09/20/23 08:18 09/20/23 11:28 09/20/23 16:14 09/20/23 20:05 09/20/23 22:37 09/21/23 03:32 09/21/23 08:16  Glucose-Capillary 70 - 99 mg/dL 98 884 (H) 892 (H) 893 (H) 94 128 (H) 88  (H): Data is abnormally high  Diabetes history: DM2 (dx 2020; hx of gastric sleeve 2022), 37W Outpatient Diabetes medications: Omnipod 0.5 units/hr, I:CR 1:11 grams, I:SF 1:70 mg/dl Prior to Pregnancy: Ozempic  Qwk Lantus  16 units every day (prior to gastric sleeve) Current orders for Inpatient glycemic control: Novolog  0-20 units tid, 0-5 units hs correction  Inpatient Diabetes Program Recommendations:   Please consider: -Decrease Novolog  correction to 0-9 units tid, 0-5 units hs  Thank you, Laura E. Yardley Beltran, RN, MSN, CDCES  Diabetes Coordinator Inpatient Glycemic Control Team Team Pager [phone] (8am-5pm) 09/21/2023 11:00 AM

## 2023-09-21 NOTE — Progress Notes (Signed)
 Physical Therapy Treatment Patient Details Name: Laura Benson MRN: [id number] DOB: [birth year] Today's Date: 09/21/2023   History of Present Illness Pt is 33 year old presented to [facility] on  09/17/23 for induction of labor due to uncontrolled HTN. Pt developed pulmonary edema and AKI and underwent csection on 09/19/23. Remainined intubated until 09/20/23. PMH - IDDM, severe obesity BMI 69, OSA, PCOS, HTN, asthma.    PT Comments  Returned for a second session to progress mobility. Pt with good progress and able to amb a short distance in room. Pt very motivated to return to independence. Pt uncomfortable in bariatric bed with air mattress and difficult to move on. Pt had been on regular size bed in ICU. Pt switched to regular bed and reports more comfortable and is able to shift her wt and move to relieve pressure. Regular bed should allow her sit for extended periods on EOB.     If plan is discharge home, recommend the following: A little help with walking and/or transfers;A little help with bathing/dressing/bathroom;Assist for transportation;Help with stairs or ramp for entrance;Assistance with cooking/housework   Can travel by Media planner walker (2 wheels) (bariatric walker)    Recommendations for Other Services       Precautions / Restrictions Precautions Precautions: Fall;Other (comment) Precaution/Restrictions Comments: wound VAC Restrictions Weight Bearing Restrictions Per Provider Order: No     Mobility  Bed Mobility Overal bed mobility: Needs Assistance Bed Mobility: Supine to Sit, Sit to Supine     Supine to sit: Min assist, HOB elevated, Used rails Sit to supine: Min assist, HOB elevated, Used rails   General bed mobility comments: Assist to move RLE off/on bed and to elevate trunk into sitting. Pt placed HOB way up for both.    Transfers Overall transfer level: Needs assistance Equipment used: Rolling walker (2  wheels) Transfers: Sit to/from Stand Sit to Stand: Min assist, +2 physical assistance, +2 safety/equipment           General transfer comment: Assist to power up and incr time. On initial stand +2 min assist and on next stand +1 min assist and 2nd person for safety    Ambulation/Gait Ambulation/Gait assistance: Min assist, +2 safety/equipment Gait Distance (Feet): 5 Feet (x 2) Assistive device: Rolling walker (2 wheels) Gait Pattern/deviations: Step-through pattern, Decreased step length - right, Decreased step length - left, Decreased stride length Gait velocity: decr Gait velocity interpretation: <1.31 ft/sec, indicative of household ambulator Pre-gait activities: Stood at bedside x 1 minute with CGA to maintain General Gait Details: Assist for support and safety.   Stairs             Wheelchair Mobility     Tilt Bed    Modified Rankin (Stroke Patients Only)       Balance Overall balance assessment: Needs assistance Sitting-balance support: No upper extremity supported, Feet supported Sitting balance-Leahy Scale: Fair     Standing balance support: During functional activity, No upper extremity supported Standing balance-Leahy Scale: Fair Standing balance comment: On regular bed                            Communication Communication Communication: No apparent difficulties  Cognition Arousal: Alert Behavior During Therapy: WFL for tasks assessed/performed   PT - Cognitive impairments: No apparent impairments  Following commands: Intact      Cueing Cueing Techniques: Verbal cues  Exercises      General Comments General comments (skin integrity, edema, etc.): VSS on 4L      Pertinent Vitals/Pain Pain Assessment Pain Assessment: Faces Faces Pain Scale: Hurts even more Pain Location: abdomen Pain Descriptors / Indicators: Burning, Grimacing Pain Intervention(s): Limited activity within patient's  tolerance, Monitored during session, Repositioned    Home Living Family/patient expects to be discharged to:: Private residence Living Arrangements: Parent (father) Available Help at Discharge: Family;Available 24 hours/day Type of Home: House Home Access: Stairs to enter Entrance Stairs-Rails: Doctor, general practice of Steps: 4   Home Layout: One level Home Equipment: None      Prior Function            PT Goals (current goals can now be found in the care plan section) Acute Rehab PT Goals Patient Stated Goal: return home with baby PT Goal Formulation: With patient Time For Goal Achievement: 10/05/23 Potential to Achieve Goals: Good Progress towards PT goals: Progressing toward goals    Frequency    Min 3X/week      PT Plan      Co-evaluation              AM-PAC PT 6 Clicks Mobility   Outcome Measure  Help needed turning from your back to your side while in a flat bed without using bedrails?: A Little Help needed moving from lying on your back to sitting on the side of a flat bed without using bedrails?: A Little Help needed moving to and from a bed to a chair (including a wheelchair)?: Total Help needed standing up from a chair using your arms (e.g., wheelchair or bedside chair)?: Total Help needed to walk in hospital room?: Total Help needed climbing 3-5 steps with a railing? : Total 6 Click Score: 10    End of Session Equipment Utilized During Treatment: Oxygen Activity Tolerance: Patient tolerated treatment well Patient left: in bed;with call bell/phone within reach;with family/visitor present;with nursing/sitter in room Nurse Communication: Mobility status (Nursing assisted with) PT Visit Diagnosis: Other abnormalities of gait and mobility (R26.89);Muscle weakness (generalized) (M62.81);Difficulty in walking, not elsewhere classified (R26.2);Pain Pain - part of body:  (abdomen)     Time: 8279-8194 PT Time Calculation (min) (ACUTE  ONLY): 45 min  Charges:    $Gait Training: 8-22 mins $Therapeutic Activity: 23-37 mins PT General Charges $$ ACUTE PT VISIT: 1 Visit                     [facility] PT Acute Rehabilitation Services Office [phone]    Rodgers ORN [facility] 09/21/2023, 6:35 PM

## 2023-09-21 NOTE — Progress Notes (Signed)
  Echocardiogram 2D Echocardiogram has been performed.  Devora Ellouise SAUNDERS 09/21/2023, 11:46 AM

## 2023-09-22 ENCOUNTER — Inpatient Hospital Stay (HOSPITAL_COMMUNITY)

## 2023-09-22 DIAGNOSIS — D62 Acute posthemorrhagic anemia: Secondary | ICD-10-CM | POA: Diagnosis not present

## 2023-09-22 DIAGNOSIS — J189 Pneumonia, unspecified organism: Secondary | ICD-10-CM | POA: Diagnosis not present

## 2023-09-22 LAB — GLUCOSE, CAPILLARY
Glucose-Capillary: 89 mg/dL (ref 70–99)
Glucose-Capillary: 110 mg/dL — ABNORMAL HIGH (ref 70–99)
Glucose-Capillary: 115 mg/dL — ABNORMAL HIGH (ref 70–99)
Glucose-Capillary: 103 mg/dL — ABNORMAL HIGH (ref 70–99)

## 2023-09-22 LAB — COMPREHENSIVE METABOLIC PANEL WITH GFR
ALT: 14 U/L (ref 0–44)
AST: 26 U/L (ref 15–41)
Albumin: 1.9 g/dL — ABNORMAL LOW (ref 3.5–5.0)
Alkaline Phosphatase: 61 U/L (ref 38–126)
Anion gap: 7 (ref 5–15)
BUN: 7 mg/dL (ref 6–20)
CO2: 24 mmol/L (ref 22–32)
Calcium: 8.3 mg/dL — ABNORMAL LOW (ref 8.9–10.3)
Chloride: 107 mmol/L (ref 98–111)
Creatinine, Ser: 1.02 mg/dL — ABNORMAL HIGH (ref 0.44–1.00)
GFR, Estimated: >60 mL/min (ref >60)
Glucose, Bld: 95 mg/dL (ref 70–99)
Potassium: 4 mmol/L (ref 3.5–5.1)
Sodium: 138 mmol/L (ref 135–145)
Total Bilirubin: 0.6 mg/dL (ref 0.0–1.2)
Total Protein: 5.3 g/dL — ABNORMAL LOW (ref 6.5–8.1)

## 2023-09-22 LAB — CBC
HCT: 23.5 % — ABNORMAL LOW (ref 36.0–46.0)
Hemoglobin: 7.4 g/dL — ABNORMAL LOW (ref 12.0–15.0)
MCH: 24.2 pg — ABNORMAL LOW (ref 26.0–34.0)
MCHC: 31.5 g/dL (ref 30.0–36.0)
MCV: 76.8 fL — ABNORMAL LOW (ref 80.0–100.0)
Platelets: 209 K/uL (ref 150–400)
RBC: 3.06 MIL/uL — ABNORMAL LOW (ref 3.87–5.11)
RDW: 14.6 % (ref 11.5–15.5)
WBC: 8.2 K/uL (ref 4.0–10.5)
nRBC: 0 % (ref 0.0–0.2)

## 2023-09-22 LAB — SURGICAL PATHOLOGY

## 2023-09-22 LAB — PREPARE RBC (CROSSMATCH)

## 2023-09-22 LAB — BRAIN NATRIURETIC PEPTIDE: B Natriuretic Peptide: 56.4 pg/mL (ref 0.0–100.0)

## 2023-09-22 MED ORDER — POTASSIUM CHLORIDE CRYS ER 20 MEQ PO TBCR
40.0000 meq | EXTENDED_RELEASE_TABLET | Freq: Every day | ORAL | Status: DC
  Administered 2023-09-23 – 2023-09-25 (×3): 40 meq via ORAL
  Filled 2023-09-22 (×3): qty 2

## 2023-09-22 MED ORDER — FUROSEMIDE 10 MG/ML IJ SOLN
40.0000 mg | Freq: Once | INTRAMUSCULAR | Status: AC
  Administered 2023-09-22: 40 mg via INTRAVENOUS
  Filled 2023-09-22: qty 4

## 2023-09-22 MED ORDER — FERROUS SULFATE 325 (65 FE) MG PO TABS: 325.0000 mg | ORAL_TABLET | ORAL | Status: DC

## 2023-09-22 MED ORDER — IOHEXOL 350 MG/ML SOLN
50.0000 mL | Freq: Once | INTRAVENOUS | Status: AC | PRN
  Administered 2023-09-22: 50 mL via INTRAVENOUS

## 2023-09-22 MED ORDER — AMOXICILLIN-POT CLAVULANATE 875-125 MG PO TABS: 1.0000 | ORAL_TABLET | Freq: Two times a day (BID) | ORAL | Status: DC

## 2023-09-22 MED ORDER — HYDRALAZINE HCL 50 MG PO TABS
100.0000 mg | ORAL_TABLET | Freq: Two times a day (BID) | ORAL | Status: DC
  Administered 2023-09-22 – 2023-09-25 (×6): 100 mg via ORAL
  Filled 2023-09-22 (×6): qty 2

## 2023-09-22 MED ORDER — SODIUM CHLORIDE 0.9 % IV SOLN: 3.0000 g | Freq: Four times a day (QID) | INTRAVENOUS | Status: DC

## 2023-09-22 MED ORDER — SODIUM CHLORIDE 0.9 % IV SOLN
3.0000 g | Freq: Four times a day (QID) | INTRAVENOUS | Status: DC
  Administered 2023-09-22 – 2023-09-25 (×12): 3 g via INTRAVENOUS
  Filled 2023-09-22 (×12): qty 8

## 2023-09-22 MED ORDER — IRON SUCROSE 500 MG IVPB - SIMPLE MED: 500.0000 mg | Freq: Once | INTRAVENOUS | Status: DC

## 2023-09-22 MED ORDER — FUROSEMIDE 40 MG PO TABS
40.0000 mg | ORAL_TABLET | Freq: Every day | ORAL | Status: DC
  Administered 2023-09-23 – 2023-09-25 (×3): 40 mg via ORAL
  Filled 2023-09-22 (×3): qty 1

## 2023-09-22 MED ORDER — ENALAPRIL MALEATE 2.5 MG PO TABS
5.0000 mg | ORAL_TABLET | Freq: Every day | ORAL | Status: DC
  Administered 2023-09-22 – 2023-09-25 (×4): 5 mg via ORAL
  Filled 2023-09-22 (×4): qty 2

## 2023-09-22 MED ORDER — LABETALOL HCL 200 MG PO TABS
300.0000 mg | ORAL_TABLET | Freq: Three times a day (TID) | ORAL | Status: DC
  Administered 2023-09-22: 300 mg via ORAL
  Filled 2023-09-22: qty 1

## 2023-09-22 MED ORDER — HYDRALAZINE HCL 50 MG PO TABS
50.0000 mg | ORAL_TABLET | Freq: Three times a day (TID) | ORAL | Status: DC
  Administered 2023-09-22: 50 mg via ORAL
  Filled 2023-09-22: qty 1

## 2023-09-22 MED ORDER — SODIUM CHLORIDE 0.9% IV SOLUTION: Freq: Once | INTRAVENOUS | Status: AC

## 2023-09-22 NOTE — Progress Notes (Signed)
 Daily Postpartum Note  Admission Date: 09/17/2023 Current Date: 09/22/2023 11:05 AM  Anielle Headrick is a 33 y.o. G1P1001 POD#3 s/p primary low transverse c/s (prevena in place) at [redacted]w[redacted]d for failed induction of labor for CHTN/DM2.  Peripartum course c/b for developing superimposed severe pre-eclampsia (pulmonary edema, AKI), need for intubation  Pregnancy complicated by: Patient Active Problem List   Diagnosis Date Noted   Postoperative anemia due to acute blood loss 09/22/2023   Respiratory distress 09/19/2023   Type 2 diabetes mellitus during pregnancy 09/17/2023   Gestational diabetes mellitus (GDM) requiring insulin  06/08/2023   Poor fetal growth affecting management of mother in second trimester 05/25/2023   Alpha thalassemia silent carrier 04/08/2023   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   Supervision of high risk pregnancy, antepartum 03/17/2023   Morbid obesity (HCC) 03/17/2023   MDD (major depressive disorder), recurrent, severe, with psychosis (HCC) 08/31/2021   GAD (generalized anxiety disorder) 10/23/2020   Complication of anesthesia 09/04/2020   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   Lumbar radiculopathy 08/17/2019   Uncontrolled type 2 diabetes mellitus with hyperglycemia (HCC) 01/18/2019   Asthma 06/08/2018   Umbilical hernia without obstruction and without gangrene 02/25/2017   Obstructive sleep apnea 12/11/2016   Chronic hypertension in pregnancy 01/23/2016   Depression, recurrent (HCC) 01/05/2009   Severe obesity (BMI >= 40) (HCC) 03/26/2006    Overnight/24hr events:  Cpap started overnight with sleeping Slight blood tinged sputum this morning.   Subjective:  Feeling better respiratory wise. +flatus, no nausea/vomiting. Foley still in place and hasn't ambulated yet today  Objective:    Current Vital Signs 24h Vital Sign Ranges  T 99.3 F (37.4 C) Temp  Avg: 99.1 F (37.3 C)  Min: 98.6 F (37 C)  Max: 100 F (37.8 C)  BP (!) 149/72 BP  Min:  138/66  Max: 153/68  HR (!) 108 Pulse  Avg: 110.4  Min: 100  Max: 123  RR (!) 21 Resp  Avg: 21.7  Min: 17  Max: 30  SaO2 96 % Room Air SpO2  Avg: 95.7 %  Min: 93 %  Max: 98 %       24 Hour I/O Current Shift I/O  Time Ins Outs 08/25 0701 - 08/26 0700 In: 311.7  Out: 5550 [Urine:5550] 08/26 0701 - 08/26 1900 In: -  Out: 550 [Urine:550]   Patient Vitals for the past 24 hrs:  BP Temp Temp src Pulse Resp SpO2  09/22/23 0748 (!) 149/72 99.3 F (37.4 C) Oral (!) 108 (!) 21 96 %  09/22/23 0340 -- -- -- -- -- 96 %  09/22/23 0322 (!) 150/65 98.6 F (37 C) Oral 100 (!) 28 --  09/22/23 0053 -- -- -- (!) 110 (!) 30 98 %  09/21/23 2307 (!) 146/69 99.4 F (37.4 C) Oral (!) 123 20 --  09/21/23 2040 -- 98.6 F (37 C) Oral -- -- --  09/21/23 1920 138/66 100 F (37.8 C) Oral (!) 108 18 94 %  09/21/23 1824 -- -- -- -- -- 97 %  09/21/23 1550 (!) 153/68 98.8 F (37.1 C) Oral (!) 114 17 93 %  09/21/23 1200 (!) 143/67 98.8 F (37.1 C) Oral (!) 110 18 96 %    Physical exam: General: Well nourished, well developed female in no acute distress. Abdomen: obese, nttp, rare bowel sounds, prevena in place and functioning wnl Cardiovascular: S1, S2 normal, no murmur, rub or gallop, regular rate and rhythm Respiratory: CTAB Skin: Warm and dry.   Medications:  Current Facility-Administered Medications  Medication Dose Route Frequency Provider Last Rate Last Admin   0.9 %  sodium chloride  infusion (Manually program via Guardrails IV Fluids)   Intravenous Once Kumar, Agnijita, MD       acetaminophen  (TYLENOL ) tablet 1,000 mg  1,000 mg Oral Q6H Cleatus Moccasin, MD   1,000 mg at 09/22/23 9349   albuterol  (PROVENTIL ) (2.5 MG/3ML) 0.083% nebulizer solution 2.5 mg  2.5 mg Nebulization Q4H PRN Cashion, Colter L, MD   2.5 mg at 09/19/23 0313   amLODipine  (NORVASC ) tablet 10 mg  10 mg Oral Daily Cleatus Moccasin, MD   10 mg at 09/22/23 1014   buPROPion  (WELLBUTRIN  XL) 24 hr tablet 150 mg  150 mg Oral Daily Izell Harari, MD   150 mg at 09/22/23 1014   Chlorhexidine  Gluconate Cloth 2 % PADS 6 each  6 each Topical Daily Cleatus Moccasin, MD   6 each at 09/20/23 0110   coconut oil  1 Application Topical PRN Sehgal, Sanchala, MD       cyclobenzaprine  (FLEXERIL ) tablet 10 mg  10 mg Oral TID PRN Cleatus Moccasin, MD       witch hazel-glycerin  (TUCKS) pad 1 Application  1 Application Topical PRN Sehgal, Sanchala, MD       And   dibucaine (NUPERCAINAL) 1 % rectal ointment 1 Application  1 Application Rectal PRN Sehgal, Sanchala, MD       enoxaparin  (LOVENOX ) injection 90 mg  90 mg Subcutaneous Q24H Sehgal, Sanchala, MD   90 mg at 09/22/23 1014   [START ON 09/23/2023] furosemide  (LASIX ) tablet 40 mg  40 mg Oral Daily Izell Harari, MD       gabapentin  (NEURONTIN ) capsule 100 mg  100 mg Oral TID Cleatus Moccasin, MD   100 mg at 09/22/23 1014   labetalol  (NORMODYNE ) injection 20 mg  20 mg Intravenous PRN Nicholaus Almarie HERO, MD   20 mg at 09/19/23 9573   And   labetalol  (NORMODYNE ) injection 40 mg  40 mg Intravenous PRN Nicholaus Almarie HERO, MD       And   labetalol  (NORMODYNE ) injection 80 mg  80 mg Intravenous PRN Nicholaus Almarie HERO, MD       And   hydrALAZINE  (APRESOLINE ) injection 10 mg  10 mg Intravenous PRN Nicholaus Almarie HERO, MD       hydrALAZINE  (APRESOLINE ) tablet 50 mg  50 mg Oral Q8H Izell Harari, MD       insulin  aspart (novoLOG ) injection 0-5 Units  0-5 Units Subcutaneous QHS Izell Harari, MD       insulin  aspart (novoLOG ) injection 0-9 Units  0-9 Units Subcutaneous TID WC Izell Harari, MD   1 Units at 09/21/23 1907   labetalol  (NORMODYNE ) tablet 200 mg  200 mg Oral Q12H Izell Harari, MD   200 mg at 09/22/23 1013   menthol -cetylpyridinium (CEPACOL) lozenge 3 mg  1 lozenge Oral Q2H PRN Sehgal, Sanchala, MD       Oral care mouth rinse  15 mL Mouth Rinse PRN Cleatus Moccasin, MD       Oral care mouth rinse  15 mL Mouth Rinse 4 times per day Cleatus Moccasin, MD       Oral care mouth rinse  15 mL Mouth  Rinse PRN Cleatus Moccasin, MD       oxyCODONE  (Oxy IR/ROXICODONE ) immediate release tablet 5-10 mg  5-10 mg Oral Q4H PRN Cleatus Moccasin, MD   10 mg at 09/21/23 1450   pantoprazole  (PROTONIX ) EC tablet 40 mg  40 mg Oral Daily Izell Harari, MD   40 mg at 09/22/23 1014   polyethylene glycol (MIRALAX  / GLYCOLAX ) packet 17 g  17 g Oral Daily Cleatus Moccasin, MD   17 g at 09/22/23 1012   [START ON 09/23/2023] potassium chloride  SA (KLOR-CON  M) CR tablet 40 mEq  40 mEq Oral Daily Izell Harari, MD       prenatal multivitamin tablet 1 tablet  1 tablet Oral Q1200 Cleatus Moccasin, MD   1 tablet at 09/21/23 1206   senna-docusate (Senokot-S) tablet 2 tablet  2 tablet Oral Daily Cleatus Moccasin, MD   2 tablet at 09/22/23 1013   simethicone  (MYLICON) chewable tablet 80 mg  80 mg Oral TID PC Cleatus Moccasin, MD   80 mg at 09/22/23 1013   simethicone  (MYLICON) chewable tablet 80 mg  80 mg Oral PRN Cleatus Moccasin, MD   80 mg at 09/21/23 0403   Labs:  Recent Labs  Lab 09/20/23 0853 09/21/23 0424 09/22/23 0521  WBC 14.7* 10.2 8.2  HGB 9.1* 7.2* 7.4*  HCT 28.7* 22.3* 23.5*  PLT 200 183 209   Recent Labs  Lab 09/21/23 0424 09/21/23 1505 09/22/23 0521  NA 138 140 138  K 3.7 3.9 4.0  CL 109 107 107  CO2 24 24 24   BUN 12 9 7   CREATININE 1.37* 1.10* 1.02*  CALCIUM 7.7* 8.2* 8.3*  PROT 4.9* 5.4* 5.3*  BILITOT 0.5 0.6 0.6  ALKPHOS 64 66 61  ALT 11 12 14   AST 22 24 26   GLUCOSE 116* 149* 95    Latest Reference Range & Units 09/21/23 03:32 09/21/23 08:16 09/21/23 14:01 09/21/23 19:02 09/21/23 22:30 09/22/23 09:37  Glucose-Capillary 70 - 99 mg/dL 871  88 893  865  863  89   Radiology:  No new imaging  Assessment & Plan:  Patient stable *Postpartum: foley currently in and will d/c later today.  -O POS/boy, needs circ (consented)/rpr neg/birth control: TBD/breast feeding planned *CV: increased hydralazine  to pre-delivery dose of 50 q8h. Will leave labetalol  and norvasc  at same dose. Continue with IV lasix   today and start PO tomorrow -8/25 echo wnl *Pulm: pt set up with cpap overnight for sleep by RT. [place] down to 2L this morning. Will get CT PE given hemoptysis which could be from being intubated. *DM2: DM diet ordered with ACHS checks with SSI. DM team consulted *Anemia: may need a unit of blood based on if any s/s with PT today -s/p IV iron  on 8/26 *History of sleeve: no current issues *BMI 69: PT following. Will likely need to go home on 3-4wks of ppx lovenox . Prevena in place.  *PPx: lovenox , OOB, PPI *FEN/GI: SLIV, DM diet *Dispo: likely pod#5  Harari Izell Raddle MD Attending Center for [facility] GYN Consult Phone: [phone] (M-F, [phone]) & [phone]  (Off hours, weekends, holidays)

## 2023-09-22 NOTE — Consult Note (Addendum)
 Initial Consult Note   Laura Benson FMW:[id number] DOB: 1990/10/05 DOA: 09/17/2023  PCP: [facility]  Patient coming from: home  I have personally briefly reviewed patient's old medical records in [facility]  Chief Complaint: consult for pneumonia  HPI: Laura Benson is Laura Benson 33 y.o. female with medical history significant of T2DM on insulin  pump, obesity, OSA, HTN, depression/anxiety who was admitted for induction of labor.    Her course was complicated by acute hypoxic respiratory failure due to acute pulmonary edema in the setting of uncontrolled hypertension requiring bipap.  She was intubated for c section and remained intubated post op with critical care following.  She was extubated 8/24.  Since her extubation, they've had issues with weaning her oxygen.  Today she had some small volume hemoptysis and OB obtained Laura Benson CT PE protocol which showed evidence of pneumonia.  The hospitalists have been consulted for assistance with management of pneumonia.    Today Laura Benson notes that she feels short of breath.  She notes SOB with minimal exertion.  To some extent, her dyspnea on exertion has been Laura Benson chronic issue.  In addition, she notes lightheadedness when she was standing to go to the bathroom earlier.  She notes CP with cough.  Denies fevers, chills.  No nausea, vomiting.    Significant Events 8/21 presented for IOL. Insulin  gtt 8/22 epidural  8/23 new O2 req, progressive SOB, NRB --> BiPAP. Diuresing. PCCM consult  8/23 primary low transverse C section with vacuum assistance with kiwi vacuum and IUD insertion - she was intubated for C section and remained intubated post op 8/24 extubated 8/26 small volume hemoptysis, CT PE protocol with pneumonia - hospitalists consulted  Review of Systems: As per HPI otherwise all other systems reviewed and are negative.  Past Medical History:  Diagnosis Date   Anxiety    Asthma    Last used rescue inhaler 6mon ago    Complication of anesthesia    Take for Rohaan Durnil while to wake up   Depression    Diabetes mellitus without complication (HCC)    Hypertension    Morbid obesity (HCC)    PCOS (polycystic ovarian syndrome)    Sleep apnea    CPAP  last used CPAP 2y ago   Tachycardia     Past Surgical History:  Procedure Laterality Date   ADENOIDECTOMY  2006   CESAREAN SECTION N/Thiago Ragsdale 09/19/2023   Procedure: CESAREAN DELIVERY;  Surgeon: Cleatus Moccasin, MD;  Location: [facility] LD ORS;  Service: Obstetrics;  Laterality: N/[place] Arch;   INTRAUTERINE DEVICE (IUD) INSERTION N/Lashona Schaaf 09/19/2023   Procedure: INSERTION, INTRAUTERINE DEVICE;  Surgeon: Cleatus Moccasin, MD;  Location: [facility] LD ORS;  Service: Obstetrics;  Laterality: N/Tywaun Hiltner;   LAPAROSCOPIC GASTRIC SLEEVE RESECTION N/Alahni Varone 04/16/2020   Procedure: LAPAROSCOPIC GASTRIC SLEEVE RESECTION;  Surgeon: Tanda Locus, MD;  Location: [facility] ORS;  Service: General;  Laterality: N/Gabrielle Wakeland;   TONSILLECTOMY  2006   UPPER GI ENDOSCOPY N/Kadra Kohan 04/16/2020   Procedure: UPPER GI ENDOSCOPY;  Surgeon: Tanda Locus, MD;  Location: [facility] ORS;  Service: General;  Laterality: N/Stephens Shreve;    Social History  reports that she has quit smoking. Her smoking use included cigarettes. She has never used smokeless tobacco. She reports current drug use. Drug: Marijuana. She reports that she does not drink alcohol.  No Known Allergies  Family History  Problem Relation Age of Onset   Stroke Mother    Hypertension Mother    Diabetes Mother    Hypertension Father  Diabetes Father    Colon cancer Father    Diabetes Sister    Hypertension Maternal Grandmother    Hypertension Paternal Grandmother    Diabetes Paternal Grandmother    Breast cancer Cousin    Prior to Admission medications   Medication Sig Start Date End Date Taking? Authorizing Provider  acetaminophen -caffeine (EXCEDRIN  TENSION HEADACHE) 500-65 MG TABS per tablet Take 2 tablets by mouth every 8 (eight) hours as needed. 03/05/23  Yes Cresenzo, [name], MD  albuterol  (VENTOLIN  HFA) 108  (90 Base) MCG/ACT inhaler Inhale 2 puffs into the lungs every 6 (six) hours as needed for wheezing or shortness of breath. 02/03/22  Yes Frann Mabel Mt, DO  amLODipine  (NORVASC ) 10 MG tablet Take 1 tablet (10 mg total) by mouth daily. 08/14/23  Yes Cresenzo, [name], MD  aspirin  EC 81 MG tablet Take 1 tablet (81 mg total) by mouth 2 (two) times daily. Swallow whole. 07/06/23  Yes [name], MD  fluticasone  (FLONASE ) 50 MCG/ACT nasal spray Place 2 sprays into both nostrils daily. Patient taking differently: Place 2 sprays into both nostrils daily as needed for allergies. 11/22/22  Yes Henderly, Britni Teriana Danker, PA-C  hydrALAZINE  (APRESOLINE ) 50 MG tablet Take 1 tablet (50 mg total) by mouth 3 (three) times daily. 09/16/23 12/15/23 Yes Tobb, Kardie, DO  insulin  lispro (HUMALOG ) 100 UNIT/ML injection For use with insulin  pump. Basal rate: 0.5 u/hr. Bolus: 5 units TID with meals. 07/24/23  Yes Cresenzo, [name], MD  labetalol  (NORMODYNE ) 300 MG tablet Take 1 tablet (300 mg total) by mouth 3 (three) times daily. Take 1 tablet (200 mg total) by mouth 2 (two) times daily. 08/28/23  Yes [name], MD  Prenatal 28-0.8 MG TABS Take 1 tablet by mouth daily. 08/28/23  Yes [name], MD  promethazine  (PHENERGAN ) 25 MG tablet Take 1 tablet (25 mg total) by mouth every 6 (six) hours as needed for nausea or vomiting. 03/04/23  Yes Vannie Matar R, CNM  sertraline  (ZOLOFT ) 50 MG tablet Take 1 tablet (50 mg total) by mouth daily. 07/06/23  Yes [name], MD  traMADol  (ULTRAM ) 50 MG tablet Take 1 tablet (50 mg total) by mouth every 6 (six) hours as needed for moderate pain (pain score 4-6). 08/13/23  Yes Trudy Earnie CROME, CNM  Continuous Glucose Receiver (DEXCOM G7 RECEIVER) DEVI 1 Units by Does not apply route daily. 05/20/23   [name], MD  Continuous Glucose Sensor (DEXCOM G7 SENSOR) MISC Apply one sensor every 10 days ICD10: O24.414 (Gestational diabetes, insulin  dependent)  07/06/23   [name], MD  Insulin  Disposable Pump (OMNIPOD 5 DEXG7G6 PODS GEN 5) MISC 1 Device by Does not apply route every 3 (three) days. 07/24/23   Cresenzo, [name], MD  Insulin  Pen Needle (BD PEN NEEDLE NANO 2ND GEN) 32G X 4 MM MISC 1 Units by Does not apply route daily. 06/08/23   [name], MD    Physical Exam: Vitals:   09/22/23 0748 09/22/23 1252 09/22/23 1613 09/22/23 1614  BP: (!) 149/72 138/72    Pulse: (!) 108 (!) 105    Resp: (!) 21 20    Temp: 99.3 F (37.4 C) 99.2 F (37.3 C)    TempSrc: Oral Oral    SpO2: 96% 97% (!) 89% 92%  Weight:      Height:        Constitutional: NAD, obese Vitals:   09/22/23 0748 09/22/23 1252 09/22/23 1613 09/22/23 1614  BP: ROLLEN)  149/72 138/72    Pulse: (!) 108 (!) 105    Resp: (!) 21 20    Temp: 99.3 F (37.4 C) 99.2 F (37.3 C)    TempSrc: Oral Oral    SpO2: 96% 97% (!) 89% 92%  Weight:      Height:       Eyes: EOMI ENMT: Mucous membranes are moist. Neck: normal, supple Respiratory: diminished due to body habitus, no adventitious lung sounds appreciated - SOB with minimal exertion  Cardiovascular: tachy, regular - no noted LE edema Abdomen: deferred Musculoskeletal: no clubbing / cyanosis. No joint deformity upper and lower extremities. Good ROM, no contractures. Normal muscle tone.  Skin: no rashes, lesions, ulcers. No induration Neurologic: CN 2-12 grossly intact. Moving all extremities  Psychiatric: Normal judgment and insight. Alert and oriented x 3. Normal mood.   Labs on Admission: I have personally reviewed following labs and imaging studies  CBC: Recent Labs  Lab 09/18/23 1844 09/19/23 0411 09/19/23 1219 09/19/23 1632 09/20/23 0853 09/21/23 0424 09/22/23 0521  WBC 7.0 8.5  --   --  14.7* 10.2 8.2  NEUTROABS  --   --   --   --  11.7*  --   --   HGB 11.7* 12.0 11.9*  --  9.1* 7.2* 7.4*  HCT 37.5 37.7 35.0*  --  28.7* 22.3* 23.5*  MCV 76.2* 75.9*  --   --  77.2* 76.4* 76.8*  PLT 245 249   --  164 200 183 209    Basic Metabolic Panel: Recent Labs  Lab 09/19/23 0411 09/19/23 0805 09/20/23 0853 09/20/23 1850 09/21/23 0424 09/21/23 1505 09/22/23 0521  NA 134*   < > 135 135 138 140 138  K 4.0   < > 4.6 3.9 3.7 3.9 4.0  CL 107   < > 107 103 109 107 107  CO2 20*   < > 19* 22 24 24 24   GLUCOSE 100*   < > 102* 134* 116* 149* 95  BUN 7   < > 11 13 12 9 7   CREATININE 1.00   < > 1.74* 1.73* 1.37* 1.10* 1.02*  CALCIUM 8.2*   < > 7.9* 7.8* 7.7* 8.2* 8.3*  MG 4.4*  --   --   --   --   --   --    < > = values in this interval not displayed.    GFR: Estimated Creatinine Clearance: 139.9 mL/min (Kemaya Dorner) (by C-G formula based on SCr of 1.02 mg/dL (H)).  Liver Function Tests: Recent Labs  Lab 09/20/23 0853 09/20/23 1850 09/21/23 0424 09/21/23 1505 09/22/23 0521  AST 21 23 22 24 26   ALT 11 11 11 12 14   ALKPHOS 78 67 64 66 61  BILITOT 0.5 0.6 0.5 0.6 0.6  PROT 5.4* 5.0* 4.9* 5.4* 5.3*  ALBUMIN 2.2* 1.9* 1.8* 2.1* 1.9*    Urine analysis:    Component Value Date/Time   COLORURINE YELLOW 08/12/2023 2227   APPEARANCEUR HAZY (Zsofia Prout) 08/12/2023 2227   LABSPEC 1.012 08/12/2023 2227   PHURINE 6.0 08/12/2023 2227   GLUCOSEU NEGATIVE 08/12/2023 2227   HGBUR NEGATIVE 08/12/2023 2227   BILIRUBINUR NEGATIVE 08/12/2023 2227   BILIRUBINUR neg 06/28/2013 1335   KETONESUR NEGATIVE 08/12/2023 2227   PROTEINUR NEGATIVE 08/12/2023 2227   UROBILINOGEN negative 06/28/2013 1335   UROBILINOGEN 0.2 02/21/2013 0921   NITRITE NEGATIVE 08/12/2023 2227   LEUKOCYTESUR NEGATIVE 08/12/2023 2227    Radiological Exams on Admission: CT Angio Chest Pulmonary Embolism (PE) W  or WO Contrast Result Date: 09/22/2023 CLINICAL DATA:  Shortness of breath, tachycardia, hemoptysis. EXAM: CT ANGIOGRAPHY CHEST WITH CONTRAST TECHNIQUE: Multidetector CT imaging of the chest was performed using the standard protocol during bolus administration of intravenous contrast. Multiplanar CT image reconstructions and MIPs  were obtained to evaluate the vascular anatomy. RADIATION DOSE REDUCTION: This exam was performed according to the departmental dose-optimization program which includes automated exposure control, adjustment of the mA and/or kV according to patient size and/or use of iterative reconstruction technique. CONTRAST:  50mL OMNIPAQUE  IOHEXOL  350 MG/ML SOLN COMPARISON:  March 26, 2005. FINDINGS: Cardiovascular: There is no evidence of large central pulmonary embolus seen in the main pulmonary artery or main portions of the proximal left and right pulmonary arteries. However, evaluation of smaller and more peripheral branches is limited due to body habitus, and therefore smaller and more peripheral pulmonary emboli cannot be excluded on the basis of this exam. Normal cardiac size. No pericardial effusion. Mediastinum/Nodes: No enlarged mediastinal, hilar, or axillary lymph nodes. Thyroid  gland, trachea, and esophagus demonstrate no significant findings. Lungs/Pleura: No pneumothorax or pleural effusion is noted. Large airspace opacities are seen in right upper and middle lobes consistent with pneumonia. Minimal left posterior basilar subsegmental atelectasis is noted. Upper Abdomen: Cholelithiasis. Musculoskeletal: No chest wall abnormality. No acute or significant osseous findings. Review of the MIP images confirms the above findings. IMPRESSION: 1. There is no definite evidence of large central pulmonary embolus seen in the main pulmonary artery or main portions of the proximal left and right pulmonary arteries. However, evaluation of smaller and more peripheral branches is limited due to body habitus, and therefore smaller and more peripheral pulmonary emboli cannot be excluded on the basis of this exam. 2. Large airspace opacities are seen in right upper and middle lobes consistent with pneumonia. 3. Cholelithiasis. Electronically Signed   By: Lynwood Landy Raddle M.D.   On: 09/22/2023 12:20   ECHOCARDIOGRAM  COMPLETE Result Date: 09/21/2023    ECHOCARDIOGRAM REPORT   Patient Name:   Laura Benson Date of Exam: 09/21/2023 Medical Rec #:  [id number]        Height:       66.0 in Accession #:    [id number]       Weight:       426.8 lb Date of Birth:  04-21-1990       BSA:          2.760 m Patient Age:    32 years         BP:           110/50 mmHg Patient Gender: F                HR:           99 bpm. Exam Location:  Inpatient Procedure: 2D Echo, Cardiac Doppler and Color Doppler (Both Spectral and Color            Flow Doppler were utilized during procedure). Indications:    Respiratory failure.; R06.02 SOB  History:        Patient has prior history of Echocardiogram examinations, most                 recent 10/13/2008. Signs/Symptoms:Shortness of Breath and                 Dyspnea; Risk Factors:Hypertension and Diabetes.  Sonographer:    Ellouise Mose RDCS Referring Phys: [id number] [facility]  Sonographer Comments: Technically difficult study due to  poor echo windows, suboptimal parasternal window, suboptimal apical window, suboptimal subcostal window and patient is obese. Image acquisition challenging due to patient body habitus. Patient could not turn, post C section. Patient 2 days post partum. IMPRESSIONS  1. Left ventricular ejection fraction, by estimation, is 65 to 70%. Left ventricular ejection fraction by 2D MOD biplane is 65.9 %. Left ventricular ejection fraction by PLAX is 71 %. The left ventricle has normal function. The left ventricle has no regional wall motion abnormalities. There is mild left ventricular hypertrophy. Left ventricular diastolic parameters were normal.  2. Right ventricular systolic function is normal. The right ventricular size is normal. Tricuspid regurgitation signal is inadequate for assessing PA pressure.  3. The mitral valve is normal in structure. Trivial mitral valve regurgitation. No evidence of mitral stenosis.  4. The aortic valve was not well visualized. Aortic valve  regurgitation is not visualized. No aortic stenosis is present.  5. The inferior vena cava is normal in size with greater than 50% respiratory variability, suggesting right atrial pressure of 3 mmHg. Comparison(s): No prior Echocardiogram. FINDINGS  Left Ventricle: Left ventricular ejection fraction, by estimation, is 65 to 70%. Left ventricular ejection fraction by PLAX is 71 %. Left ventricular ejection fraction by 2D MOD biplane is 65.9 %. The left ventricle has normal function. The left ventricle has no regional wall motion abnormalities. The left ventricular internal cavity size was normal in size. There is mild left ventricular hypertrophy. Left ventricular diastolic parameters were normal. Normal left ventricular filling pressure. Right Ventricle: The right ventricular size is normal. No increase in right ventricular wall thickness. Right ventricular systolic function is normal. Tricuspid regurgitation signal is inadequate for assessing PA pressure. Left Atrium: Left atrial size was normal in size. Right Atrium: Right atrial size was normal in size. Pericardium: There is no evidence of pericardial effusion. Mitral Valve: The mitral valve is normal in structure. Trivial mitral valve regurgitation. No evidence of mitral valve stenosis. Tricuspid Valve: The tricuspid valve is normal in structure. Tricuspid valve regurgitation is not demonstrated. No evidence of tricuspid stenosis. Aortic Valve: The aortic valve was not well visualized. Aortic valve regurgitation is not visualized. No aortic stenosis is present. Pulmonic Valve: The pulmonic valve was normal in structure. Pulmonic valve regurgitation is mild. No evidence of pulmonic stenosis. Aorta: The aortic root and ascending aorta are structurally normal, with no evidence of dilitation. Venous: The inferior vena cava is normal in size with greater than 50% respiratory variability, suggesting right atrial pressure of 3 mmHg. IAS/Shunts: No atrial level shunt  detected by color flow Doppler.  LEFT VENTRICLE PLAX 2D                        Biplane EF (MOD) LV EF:         Left            LV Biplane EF:   Left                ventricular                      ventricular                ejection                         ejection                fraction by  fraction by                PLAX is 71                       2D MOD                %.                               biplane is LVIDd:         4.90 cm                          65.9 %. LVIDs:         2.90 cm LV PW:         1.20 cm         Diastology LV IVS:        0.90 cm         LV e' medial:    15.70 cm/s LVOT diam:     2.10 cm         LV E/e' medial:  8.1 LV SV:         90              LV e' lateral:   14.30 cm/s LV SV Index:   33              LV E/e' lateral: 8.9 LVOT Area:     3.46 cm  LV Volumes (MOD) LV vol d, MOD    120.5 ml A2C: LV vol d, MOD    94.3 ml A4C: LV vol s, MOD    40.0 ml A2C: LV vol s, MOD    29.9 ml A4C: LV SV MOD A2C:   80.5 ml LV SV MOD A4C:   94.3 ml LV SV MOD BP:    72.2 ml RIGHT VENTRICLE             IVC RV S prime:     16.80 cm/s  IVC diam: 1.70 cm TAPSE (M-mode): 1.7 cm LEFT ATRIUM           Index        RIGHT ATRIUM           Index LA diam:      4.00 cm 1.45 cm/m   RA Area:     16.70 cm LA Vol (A2C): 31.4 ml 11.38 ml/m  RA Volume:   41.60 ml  15.07 ml/m LA Vol (A4C): 60.3 ml 21.85 ml/m  AORTIC VALVE LVOT Vmax:   152.00 cm/s LVOT Vmean:  101.000 cm/s LVOT VTI:    0.259 m  AORTA Ao Root diam: 3.10 cm Ao Asc diam:  2.70 cm MITRAL VALVE MV Area (PHT): 4.30 cm     SHUNTS MV Decel Time: 177 msec     Systemic VTI:  0.26 m MV E velocity: 127.00 cm/s  Systemic Diam: 2.10 cm MV Jaziya Obarr velocity: 97.30 cm/s MV E/Minnah Llamas ratio:  1.31 Wilbert Bihari MD Electronically signed by Wilbert Bihari MD Signature Date/Time: 09/21/2023/12:22:41 PM    Final     EKG: Independently reviewed. None done   Assessment/Plan Principal Problem:   Type 2 diabetes mellitus during pregnancy Active Problems:   Severe  obesity (BMI >= 40) (HCC)   Chronic hypertension in pregnancy   GAD (generalized anxiety disorder)   Anti M Antibody positive  Respiratory distress   Postoperative anemia due to acute blood loss   Pneumonia    Assessment and Plan:  Pneumonia  CT with right upper and middle lobe opacities c/w pneumonia (CXR 8/23 with opacity of R lung fields due to multilobar pneumonia) Temp to 100 on 8/25 pm.  True fever 8/23 to 100.7, but no true fever since, downtrending wbc count.  Will treat with unasyn .  Plan for 7 day course of abx.  Consider broadening and sending blood cultures if worsening leukocytosis or recurrent fever. Negative MRSA PCR.  Respiratory culture from 8/23 with normal resp flora.   Follow sputum cx if able to collect Her ICU admission and intubation is noted, will have low threshold for adding pseudomonal coverage, but with lack of fever and normal WBC count, I think we can hold off at this time.  Repeat CXR in Kairee Kozma few weeks  Acute Hypoxic and Hypercapneic Respiratory Failure  Shortness of breath Hypoxia likely due to pneumonia above.  DOE related to pneumonia, but suspect also related to anemia. Extubated 8/24 with acute hypoxic respiratory failure in the setting of pulmonary edema Transfuse 1 unit pRBC, give with additional 40 mg IV lasix   Echo with preserved EF, no RWMA, mild LVH, normal RVSF Follow BNP, continue oral lasix  for now  Pulmonary critical care signed off on 8/24 for resp failure with hypoxia/hypercapnia, pulmonary edema, OSA, obesity - they recommended continuing nightly bipap  Hemoptysis  Small volume, likely explained by the pneumonia above CT is without large central pulm embolus - evaluation of small/peripheral branches limited due to body habitus  Will add on LE US    Symptomatic Anemia C/o DOE, lightheadedness Transfuse 1 unit pRBC and follow symptoms S/p IV iron    Sinus Tachycardia Due to above Follow EKG  AKI Improved  G1P1001 s/p primary low  transverse C section at [redacted]w[redacted]d for failed IOL for chronic HTN and T2DM  Severe preeclampsia with need for intubation (pulmonary edema, AKI) S/p c section 8/23 Post partum course per OB, primary  T2DM On insulin  pump at home Continue SSI as ordered  Hypertension Enalapril , hydralazine , amlodipine   Depression  Anxiety wellbutrin     DVT prophylaxis: lovenox   Code Status:   full  Family Communication:  Mother at bedside  Disposition Plan:  Per OB Consults called:  [facility] is Catering manager.  OB is primary.  PCCM was consulted  Admission status:  inpatient   Severity of Illness: The appropriate patient status for this patient is INPATIENT. Inpatient status is judged to be reasonable and necessary in order to provide the required intensity of service to ensure the patient's safety. The patient's presenting symptoms, physical exam findings, and initial radiographic and laboratory data in the context of their chronic comorbidities is felt to place them at high risk for further clinical deterioration. Furthermore, it is not anticipated that the patient will be medically stable for discharge from the hospital within 2 midnights of admission.   * I certify that at the point of admission it is my clinical judgment that the patient will require inpatient hospital care spanning beyond 2 midnights from the point of admission due to high intensity of service, high risk for further deterioration and high frequency of surveillance required.DEWAINE Meliton Monte MD [facility]  How to contact the [facility] Attending or Consulting provider 7A - 7P or covering provider during after hours 7P -7A, for this patient?   Check the care team in [facility] and look for Eudell Julian) attending/consulting [facility] provider  listed and b) the [facility] team listed Log into [email or web address] and use [place]'s universal password to access. If you do not have the password, please contact the hospital operator. Locate the [facility] provider you are looking  for under [facility] and page to Brian Zeitlin number that you can be directly reached. If you still have difficulty reaching the provider, please page the [facility] (Director on Call) for the Hospitalists listed on amion for assistance.  09/22/2023, 4:28 PM

## 2023-09-22 NOTE — Progress Notes (Signed)
 Pt set up on CPAP for bed time. 3L O2 bleed in with max 20 min 6 for pt comfort.

## 2023-09-22 NOTE — Progress Notes (Incomplete Revision)
 OB Note Will call Hospitalist service for consult given concern for PNA on CT PE scan complicated history particularly intubation and ICU admission and need for appropriate abx regimen  Bebe Izell Raddle MD Attending Center for [facility] (Faculty Practice) 09/22/2023 Time: 1425 GYN Consult Phone: [phone] (M-F, 0800-1700) & [phone] (Off hours, weekends, holidays)   Spoke to Dr. Sheena (patient's cardiologist) and will d/c the labetalol  and start enalapril  5mg  po qday and increase her hydralazine  to 100 q12h, in case labetalol  is affecting her pulmonary status.  Bebe Izell Raddle MD Attending Center for [employer] (Faculty Practice) 09/22/2023 Time: 1447  Bebe Izell Raddle MD Attending Center for [facility] (Faculty Practice) 09/22/2023 Time: [phone]

## 2023-09-22 NOTE — Lactation Note (Signed)
 This note was copied from a baby's chart. Lactation Consultation Note  Patient Name: Laura Benson Date: 09/22/2023 Age:34 hours Reason for consult: Follow-up assessment;Primapara;1st time breastfeeding;Maternal endocrine disorder;Infant < 5lbs;Early term 38-38.6wks;Other (Comment) (cHTN)  Visited with family of 32 63/81 weeks old female Laura Benson; Laura Benson is a P1 and reported she hasn't started pumping yet, when offered to set up the DEBP in the room she said she wasn't ready yet. Laura Benson brought pump in the room and explained that the sooner we get pumping going the better, considering she's already 3 days post-partum. This LC set up DEBP, reviewed instructions, cleaning, storage, pumping schedule, pumping log, supplementation and anticipatory guidelines. Unable to start pumping at this time because Laura Benson was getting ready to have lunch. Asked her to call for assistance when needed; she has been engaging in STS with baby while in-patient, Laura Benson helping with bottle feedings and waking Laura Benson up every 3 hours.  Feeding Mother's Current Feeding Choice: Breast Milk and Formula  Lactation Tools Discussed/Used Tools: Pump;Flanges Flange Size: 21 Breast pump type: Double-Electric Breast Pump Pump Education: Setup, frequency, and cleaning;Milk Storage Reason for Pumping: ETI < 5 lbs, 1529 CC PPH Pumping frequency: Set up DEBP at 75 hours post-partum but pumping had not been initiated yet (no pump in the room, OBSC RN had to bring one) Pumped volume: 0 mL  Interventions Interventions: Breast feeding basics reviewed;DEBP;Education;NICU Pumping Log  Plan STS whenever possible Pump both breasts on initiate mode every 3 hours for 15 minutes or whenever Laura Benson is having a bottle, ideally 8 pumping sessions/24 hours Supplement with EBM/Similac 22 calorie formula every 3 hours, baby taking + 30 ml per feeding already  Laura Benson and Laura Benson present and supportive; he was pace feeding  baby a bottle during [facility] consult. All questions and concerns answered, family to contact [facility] services PRN.  Discharge Pump: Personal;DEBP  Consult Status Consult Status: Follow-up Date: 09/23/23 Follow-up type: In-patient   Laura Benson 09/22/2023, 3:15 PM

## 2023-09-22 NOTE — Progress Notes (Addendum)
 OB Note Will call Hospitalist service for consult given concern for PNA on CT PE scan complicated history particularly intubation and ICU admission and need for appropriate abx regimen  Bebe Izell Raddle MD Attending Center for [facility] (Faculty Practice) 09/22/2023 Time: 1425 GYN Consult Phone: [phone] (M-F, 0800-1700) & [phone] (Off hours, weekends, holidays)   Spoke to Dr. Sheena (patient's cardiologist) and will d/c the labetalol  and start enalapril  5mg  po qday and increase her hydralazine  to 100 q12h, in case labetalol  is affecting her pulmonary status.  Bebe Izell Raddle MD Attending Center for [facility] (Faculty Practice) 09/22/2023 Time: [phone]

## 2023-09-22 NOTE — Progress Notes (Signed)
 OB Note Patient on better o2 of 2.5L [place] with 93-95% but still slightly tachycardic and with some blood tinged sputum. Will order a CT PE  Laura Izell Raddle MD Attending Center for [employer] (Faculty Practice) 09/22/2023 Time: 0900

## 2023-09-22 NOTE — Progress Notes (Signed)
 Physical Therapy Treatment Patient Details Name: Laura Benson MRN: [id number] DOB: [birth year] Today's Date: 09/22/2023   History of Present Illness Pt is 33 year old presented to [facility] on  09/17/23 for induction of labor due to uncontrolled HTN. Pt developed pulmonary edema and AKI and underwent csection on 09/19/23. Remainined intubated until 09/20/23. 8/26 CT PE shows possible PNA. PMH - IDDM, severe obesity BMI 69, OSA, PCOS, HTN, asthma.    PT Comments  Pt endorsing burning-type incisional pain, states she has felt short of breath with mobility. PT instructed pt in log roll technique in/out of bed for abdominal comfort and to decrease pulling sensation, tolerates well with light PT assist. Pt ambulatory for short hallway distance, standing rest breaks as needed to recover DOE. PT encouraged pt to ambulate short distances multiple times/day with support of staff, and encouraged to sit EOB for all meals. Pt progressing well.   2LO2, BP stable though complaining of dizziness post-mobility, anticipate exertional     If plan is discharge home, recommend the following: A little help with walking and/or transfers;A little help with bathing/dressing/bathroom;Assist for transportation;Help with stairs or ramp for entrance;Assistance with cooking/housework   Can travel by Media planner walker (2 wheels) (bariatric walker)    Recommendations for Other Services       Precautions / Restrictions Precautions Precautions: Fall;Other (comment) Precaution/Restrictions Comments: wound VAC, 2LO2 Restrictions Weight Bearing Restrictions Per Provider Order: No     Mobility  Bed Mobility Overal bed mobility: Needs Assistance Bed Mobility: Rolling, Sidelying to Sit Rolling: Min assist Sidelying to sit: Min assist, Used rails, HOB elevated       General bed mobility comments: assist for RLE translation, trunk elevation off bed. cues for sequencing log  roll given abdominal incision    Transfers Overall transfer level: Needs assistance Equipment used: Rolling walker (2 wheels) Transfers: Sit to/from Stand Sit to Stand: Min assist           General transfer comment: assist for rise and steady, cues for correct hand placement when rising and sitting    Ambulation/Gait Ambulation/Gait assistance: Contact guard assist Gait Distance (Feet): 40 Feet Assistive device: Rolling walker (2 wheels) Gait Pattern/deviations: Step-through pattern, Decreased stride length, Trunk flexed Gait velocity: decr     General Gait Details: for safety, cues for upright posture and proximity to RW. DOE 2/4, SpO2 91-95% on 2LO2   Stairs             Wheelchair Mobility     Tilt Bed    Modified Rankin (Stroke Patients Only)       Balance Overall balance assessment: Needs assistance Sitting-balance support: No upper extremity supported, Feet supported Sitting balance-Leahy Scale: Fair     Standing balance support: During functional activity, No upper extremity supported Standing balance-Leahy Scale: Fair                              Hotel manager: No apparent difficulties  Cognition Arousal: Alert Behavior During Therapy: WFL for tasks assessed/performed   PT - Cognitive impairments: No apparent impairments                         Following commands: Intact      Cueing Cueing Techniques: Verbal cues  Exercises      General Comments General comments (skin integrity, edema, etc.): 2LO2,  BP stable though complaining of dizziness post-mobility, anticipate exertional      Pertinent Vitals/Pain Pain Assessment Pain Assessment: Faces Faces Pain Scale: Hurts even more Pain Location: abdomen Pain Descriptors / Indicators: Burning, Sore Pain Intervention(s): Limited activity within patient's tolerance, Monitored during session, Repositioned    Home Living                           Prior Function            PT Goals (current goals can now be found in the care plan section) Acute Rehab PT Goals Patient Stated Goal: return home with baby PT Goal Formulation: With patient Time For Goal Achievement: 10/05/23 Potential to Achieve Goals: Good Progress towards PT goals: Progressing toward goals    Frequency    Min 3X/week      PT Plan      Co-evaluation              AM-PAC PT 6 Clicks Mobility   Outcome Measure  Help needed turning from your back to your side while in a flat bed without using bedrails?: A Little Help needed moving from lying on your back to sitting on the side of a flat bed without using bedrails?: A Little Help needed moving to and from a bed to a chair (including a wheelchair)?: A Little Help needed standing up from a chair using your arms (e.g., wheelchair or bedside chair)?: A Little Help needed to walk in hospital room?: A Little Help needed climbing 3-5 steps with a railing? : A Lot 6 Click Score: 17    End of Session Equipment Utilized During Treatment: Oxygen Activity Tolerance: Patient tolerated treatment well Patient left: in bed;with call bell/phone within reach;with family/visitor present;with nursing/sitter in room (sitting EOB eating lunch) Nurse Communication: Mobility status PT Visit Diagnosis: Other abnormalities of gait and mobility (R26.89);Muscle weakness (generalized) (M62.81);Difficulty in walking, not elsewhere classified (R26.2);Pain Pain - part of body:  (abdomen)     Time: 1335-1405 PT Time Calculation (min) (ACUTE ONLY): 30 min  Charges:    $Gait Training: 8-22 mins $Therapeutic Activity: 8-22 mins PT General Charges $$ ACUTE PT VISIT: 1 Visit                     Laura Benson, PT DPT Acute Rehabilitation Services Secure Chat Preferred  Office [phone]    Laura Benson 09/22/2023, 3:32 PM

## 2023-09-23 ENCOUNTER — Encounter: Payer: Self-pay | Admitting: Obstetrics and Gynecology

## 2023-09-23 ENCOUNTER — Inpatient Hospital Stay (HOSPITAL_COMMUNITY)

## 2023-09-23 DIAGNOSIS — M7989 Other specified soft tissue disorders: Secondary | ICD-10-CM

## 2023-09-23 DIAGNOSIS — J189 Pneumonia, unspecified organism: Secondary | ICD-10-CM | POA: Diagnosis not present

## 2023-09-23 LAB — BPAM RBC
Blood Product Expiration Date: 202509012359
ISSUE DATE / TIME: 202508262226
Unit Type and Rh: 9500

## 2023-09-23 LAB — CBC
HCT: 25 % — ABNORMAL LOW (ref 36.0–46.0)
Hemoglobin: 8.2 g/dL — ABNORMAL LOW (ref 12.0–15.0)
MCH: 25.2 pg — ABNORMAL LOW (ref 26.0–34.0)
MCHC: 32.8 g/dL (ref 30.0–36.0)
MCV: 76.7 fL — ABNORMAL LOW (ref 80.0–100.0)
Platelets: 242 K/uL (ref 150–400)
RBC: 3.26 MIL/uL — ABNORMAL LOW (ref 3.87–5.11)
RDW: 14.3 % (ref 11.5–15.5)
WBC: 7.3 K/uL (ref 4.0–10.5)
nRBC: 0.3 % — ABNORMAL HIGH (ref 0.0–0.2)

## 2023-09-23 LAB — BIRTH TISSUE RECOVERY COLLECTION (PLACENTA DONATION)

## 2023-09-23 LAB — TYPE AND SCREEN
ABO/RH(D): O POS
Antibody Screen: NEGATIVE
Unit division: 0

## 2023-09-23 LAB — COMPREHENSIVE METABOLIC PANEL WITH GFR
ALT: 14 U/L (ref 0–44)
AST: 22 U/L (ref 15–41)
Albumin: 2.1 g/dL — ABNORMAL LOW (ref 3.5–5.0)
Alkaline Phosphatase: 61 U/L (ref 38–126)
Anion gap: 11 (ref 5–15)
BUN: 11 mg/dL (ref 6–20)
CO2: 26 mmol/L (ref 22–32)
Calcium: 8.7 mg/dL — ABNORMAL LOW (ref 8.9–10.3)
Chloride: 103 mmol/L (ref 98–111)
Creatinine, Ser: 1.09 mg/dL — ABNORMAL HIGH (ref 0.44–1.00)
GFR, Estimated: >60 mL/min (ref >60)
Glucose, Bld: 127 mg/dL — ABNORMAL HIGH (ref 70–99)
Potassium: 3.8 mmol/L (ref 3.5–5.1)
Sodium: 140 mmol/L (ref 135–145)
Total Bilirubin: 0.7 mg/dL (ref 0.0–1.2)
Total Protein: 5.6 g/dL — ABNORMAL LOW (ref 6.5–8.1)

## 2023-09-23 LAB — EXPECTORATED SPUTUM ASSESSMENT W GRAM STAIN, RFLX TO RESP C

## 2023-09-23 LAB — GLUCOSE, CAPILLARY
Glucose-Capillary: 106 mg/dL — ABNORMAL HIGH (ref 70–99)
Glucose-Capillary: 111 mg/dL — ABNORMAL HIGH (ref 70–99)

## 2023-09-23 MED ORDER — ENSURE PLUS HIGH PROTEIN PO LIQD
237.0000 mL | Freq: Two times a day (BID) | ORAL | Status: DC
  Administered 2023-09-24 – 2023-09-25 (×3): 237 mL via ORAL
  Filled 2023-09-23 (×6): qty 237

## 2023-09-23 MED ORDER — POLYETHYLENE GLYCOL 3350 17 G PO PACK
17.0000 g | PACK | ORAL | Status: DC
  Administered 2023-09-24: 17 g via ORAL
  Filled 2023-09-23: qty 1

## 2023-09-23 MED ORDER — SODIUM CHLORIDE 0.9% FLUSH
3.0000 mL | INTRAVENOUS | Status: DC | PRN
  Administered 2023-09-23: 3 mL via INTRAVENOUS

## 2023-09-23 MED ORDER — SODIUM CHLORIDE 0.9% FLUSH
3.0000 mL | Freq: Two times a day (BID) | INTRAVENOUS | Status: DC
  Administered 2023-09-23 – 2023-09-25 (×4): 3 mL via INTRAVENOUS

## 2023-09-23 NOTE — Progress Notes (Signed)
 Foley cath not draining, patient feeling the urge to void. Rn flushing foley with no results, bedside bladder scan unable to obtain accurate results. Dr. Izell updates and to room and clamped foley and flushed with 40ml of sterile water . Urine now draining from foley.

## 2023-09-23 NOTE — Progress Notes (Signed)
 Physical Therapy Treatment Patient Details Name: Laura Benson MRN: [id number] DOB: October 28, 1990 Today's Date: 09/23/2023   History of Present Illness Pt is 33 year old presented to [facility] on  09/17/23 for induction of labor due to uncontrolled HTN. Pt developed pulmonary edema and AKI and underwent csection on 09/19/23. Remainined intubated until 09/20/23. 8/26 CT PE shows possible PNA. PMH - IDDM, severe obesity BMI 69, OSA, PCOS, HTN, asthma.    PT Comments  Pt continues to make steady progress with mobility. Pt increasing amb distance each day. Sitting up in bedside chair at end of session. Working toward return home.     If plan is discharge home, recommend the following: A little help with walking and/or transfers;A little help with bathing/dressing/bathroom;Assist for transportation;Help with stairs or ramp for entrance;Assistance with cooking/housework   Can travel by Media planner walker (2 wheels) (bariatric)    Recommendations for Other Services       Precautions / Restrictions Precautions Precautions: Fall;Other (comment) Precaution/Restrictions Comments: wound VAC, 2LO2 Restrictions Weight Bearing Restrictions Per Provider Order: No     Mobility  Bed Mobility Overal bed mobility: Needs Assistance Bed Mobility: Sidelying to Sit   Sidelying to sit: Min assist, Used rails, HOB elevated       General bed mobility comments: Assist to bring RLE off of bed and elevate trunk into sitting    Transfers Overall transfer level: Needs assistance Equipment used: Rolling walker (2 wheels) Transfers: Sit to/from Stand Sit to Stand: Contact guard assist           General transfer comment: Assist for safety/lines    Ambulation/Gait Ambulation/Gait assistance: Contact guard assist Gait Distance (Feet): 100 Feet Assistive device: Rolling walker (2 wheels) Gait Pattern/deviations: Step-through pattern, Decreased stride  length Gait velocity: decr Gait velocity interpretation: <1.31 ft/sec, indicative of household ambulator   General Gait Details: Assist for safety. Pt with several standing rest breaks.   Stairs             Wheelchair Mobility     Tilt Bed    Modified Rankin (Stroke Patients Only)       Balance Overall balance assessment: Needs assistance Sitting-balance support: No upper extremity supported, Feet supported Sitting balance-Leahy Scale: Fair     Standing balance support: During functional activity, No upper extremity supported Standing balance-Leahy Scale: Fair                              Hotel manager: No apparent difficulties  Cognition Arousal: Alert Behavior During Therapy: WFL for tasks assessed/performed   PT - Cognitive impairments: No apparent impairments                         Following commands: Intact      Cueing Cueing Techniques: Verbal cues  Exercises      General Comments General comments (skin integrity, edema, etc.): SpO2 98% amb on 2L, HR 120's. Reported dizziness - BP 130's/60's      Pertinent Vitals/Pain Pain Assessment Pain Assessment: Faces Faces Pain Scale: Hurts even more Pain Location: abdomen Pain Descriptors / Indicators: Burning, Sore Pain Intervention(s): Limited activity within patient's tolerance, Monitored during session, Repositioned    Home Living                          Prior  Function            PT Goals (current goals can now be found in the care plan section) Acute Rehab PT Goals Patient Stated Goal: return home with baby Progress towards PT goals: Progressing toward goals    Frequency    Min 3X/week      PT Plan      Co-evaluation              AM-PAC PT 6 Clicks Mobility   Outcome Measure  Help needed turning from your back to your side while in a flat bed without using bedrails?: A Little Help needed moving from lying  on your back to sitting on the side of a flat bed without using bedrails?: A Little Help needed moving to and from a bed to a chair (including a wheelchair)?: A Little Help needed standing up from a chair using your arms (e.g., wheelchair or bedside chair)?: A Little Help needed to walk in hospital room?: A Little Help needed climbing 3-5 steps with a railing? : Total 6 Click Score: 16    End of Session Equipment Utilized During Treatment: Oxygen Activity Tolerance: Patient tolerated treatment well Patient left: in chair;with call bell/phone within reach;with family/visitor present Nurse Communication: Mobility status PT Visit Diagnosis: Other abnormalities of gait and mobility (R26.89);Muscle weakness (generalized) (M62.81);Difficulty in walking, not elsewhere classified (R26.2);Pain Pain - part of body:  (abdomen)     Time: 8787-8753 PT Time Calculation (min) (ACUTE ONLY): 34 min  Charges:    $Gait Training: 23-37 mins PT General Charges $$ ACUTE PT VISIT: 1 Visit                     [facility] PT Acute Rehabilitation Services Office [phone]    Rodgers ORN [facility] 09/23/2023, 3:07 PM

## 2023-09-23 NOTE — Progress Notes (Signed)
 PROGRESS NOTE    Laura Benson  FMW:[id number] DOB: [birth year] DOA: 09/17/2023 PCP: [facility]   Brief Narrative:    33 y.o. female with medical history significant of T2DM on insulin  pump, obesity, OSA, HTN, depression/anxiety who was admitted for induction of labor.     Her course was complicated by acute hypoxic respiratory failure due to acute pulmonary edema in the setting of uncontrolled hypertension requiring bipap.  She was intubated for c section and remained intubated post op with critical care following.  She was extubated 8/24.  Since her extubation, they've had issues with weaning her oxygen.  Today she had some small volume hemoptysis and OB obtained a CT PE protocol which showed evidence of pneumonia.  The hospitalists have been consulted for assistance with management of pneumonia.    Clinically improving.  Significant Events 8/21 presented for IOL. Insulin  gtt 8/22 epidural  8/23 new O2 req, progressive SOB, NRB --> BiPAP. Diuresing. PCCM consult  8/23 primary low transverse C section with vacuum assistance with kiwi vacuum and IUD insertion - she was intubated for C section and remained intubated post op 8/24 extubated 8/26 small volume hemoptysis, CT PE protocol with pneumonia - hospitalists consulted, started on IV Unasyn   Assessment & Plan:  Principal Problem:   Type 2 diabetes mellitus during pregnancy Active Problems:   Severe obesity (BMI >= 40) (HCC)   Chronic hypertension in pregnancy   GAD (generalized anxiety disorder)   Anti M Antibody positive   Respiratory distress   Postoperative anemia due to acute blood loss   Pneumonia    Right upper and middle lobe possible bacterial community acquired Pneumonia : CT scan chest with right upper and middle lobe opacities c/w pneumonia (CXR 8/23 with opacity of R lung fields due to multilobar pneumonia) Will treat with unasyn .  Plan for 7 day course of abx.   Negative MRSA PCR.  Respiratory  culture from 8/23 with normal resp flora.   Follow sputum cx Her ICU admission and intubation is noted, will have low threshold for adding pseudomonal coverage, but with lack of fever and normal WBC count, I think we can hold off at this time.  Repeat CXR if necessary   Acute Hypoxic and Hypercapneic Respiratory Failure  Shortness of breath Hypoxia likely due to pneumonia above.  DOE related to pneumonia, but suspect also related to anemia. Extubated 8/24 with acute hypoxic respiratory failure in the setting of pulmonary edema Echo with preserved EF, no RWMA, mild LVH, normal RVSF Normal BNP (can be falsely low in the setting of class III Obesity ), continue oral lasix  for now  Pulmonary critical care signed off on 8/24 for resp failure with hypoxia/hypercapnia, pulmonary edema, OSA, obesity - they recommended continuing nightly bipap   Hemoptysis, resolved Small volume, likely explained by the pneumonia above CT is without large central pulm embolus - evaluation of small/peripheral branches limited due to body habitus  F/u LE US    Symptomatic Anemia S/p IV iron  and PRBC    Sinus Tachycardia Due to above    AKI Improved  G1P1001 s/p primary low transverse C section at [redacted]w[redacted]d for failed IOL for chronic HTN and T2DM  Severe preeclampsia with need for intubation (pulmonary edema, AKI) S/p c section 8/23 Post partum course per OB, primary Prevena in place May need to be dced on lovenox  for 3-4 weeks   T2DM On insulin  pump at home Continue SSI as ordered   Hypertension Enalapril , hydralazine , amlodipine   Major moderate  Depression  Anxiety Wellbutrin   Class III Obesity: h/o sleeve gastrectomy    DVT prophylaxis: Lovenox      Code Status: Prior Family Communication:   Status is: Inpatient Remains inpatient appropriate because: PNA    Subjective:  She still gets short of breath with minimal exertion. Denies chest pain.   Examination:  General exam: Appears calm  and comfortable, nasal oxygen in place at 1 L/min Respiratory system: Clear to auscultation. Respiratory effort normal. Cardiovascular system: S1 & S2 heard, RRR. No JVD, murmurs, rubs, gallops or clicks. No pedal edema. Gastrointestinal system: Abdomen is nondistended, soft and nontender. No organomegaly or masses felt. Normal bowel sounds heard. Central nervous system: Alert and oriented. No focal neurological deficits. Extremities: Symmetric 5 x 5 power. Skin: No rashes, lesions or ulcers Psychiatry: Judgement and insight appear normal. Mood & affect appropriate.       Diet Orders (From admission, onward)     Start     Ordered   09/21/23 0832  Diet Carb Modified Fluid consistency: Thin; Room service appropriate? Yes  Diet effective now       Question Answer Comment  Diet-HS Snack? Nothing   Calorie Level Medium 1600-2000   Fluid consistency: Thin   Room service appropriate? Yes      09/21/23 0832            Objective: Vitals:   09/23/23 0758 09/23/23 0920 09/23/23 1111 09/23/23 1112  BP: (!) 143/69  (!) 147/71   Pulse: (!) 104  94   Resp:   (!) 21   Temp: 98.4 F (36.9 C)     TempSrc: Oral     SpO2:  97%  97%  Weight:      Height:        Intake/Output Summary (Last 24 hours) at 09/23/2023 1149 Last data filed at 09/23/2023 0405 Gross per 24 hour  Intake 474.51 ml  Output 5400 ml  Net -4925.49 ml   Filed Weights   09/17/23 1117  Weight: (!) 193.6 kg    Scheduled Meds:  sodium chloride    Intravenous Once   acetaminophen   1,000 mg Oral Q6H   amLODipine   10 mg Oral Daily   buPROPion   150 mg Oral Daily   enalapril   5 mg Oral Daily   enoxaparin  (LOVENOX ) injection  90 mg Subcutaneous Q24H   furosemide   40 mg Oral Daily   gabapentin   100 mg Oral TID   hydrALAZINE   100 mg Oral Q12H   insulin  aspart  0-5 Units Subcutaneous QHS   insulin  aspart  0-9 Units Subcutaneous TID WC   pantoprazole   40 mg Oral Daily   [START ON 09/24/2023] polyethylene glycol  17 g  Oral QODAY   potassium chloride   40 mEq Oral Daily   prenatal multivitamin  1 tablet Oral Q1200   senna-docusate  2 tablet Oral Daily   simethicone   80 mg Oral TID PC   sodium chloride  flush  3 mL Intravenous Q12H   Continuous Infusions:  ampicillin -sulbactam (UNASYN ) IV 3 g (09/23/23 0819)    Nutritional status     Body mass index is 68.89 kg/m.  Data Reviewed:   CBC: Recent Labs  Lab 09/19/23 0411 09/19/23 1219 09/19/23 1632 09/20/23 0853 09/21/23 0424 09/22/23 0521 09/23/23 0516  WBC 8.5  --   --  14.7* 10.2 8.2 7.3  NEUTROABS  --   --   --  11.7*  --   --   --   HGB 12.0 11.9*  --  9.1* 7.2* 7.4* 8.2*  HCT 37.7 35.0*  --  28.7* 22.3* 23.5* 25.0*  MCV 75.9*  --   --  77.2* 76.4* 76.8* 76.7*  PLT 249  --  164 200 183 209 242   Basic Metabolic Panel: Recent Labs  Lab 09/19/23 0411 09/19/23 0805 09/20/23 1850 09/21/23 0424 09/21/23 1505 09/22/23 0521 09/23/23 0516  NA 134*   < > 135 138 140 138 140  K 4.0   < > 3.9 3.7 3.9 4.0 3.8  CL 107   < > 103 109 107 107 103  CO2 20*   < > 22 24 24 24 26   GLUCOSE 100*   < > 134* 116* 149* 95 127*  BUN 7   < > 13 12 9 7 11   CREATININE 1.00   < > 1.73* 1.37* 1.10* 1.02* 1.09*  CALCIUM 8.2*   < > 7.8* 7.7* 8.2* 8.3* 8.7*  MG 4.4*  --   --   --   --   --   --    < > = values in this interval not displayed.   GFR: Estimated Creatinine Clearance: 131 mL/min (A) (by C-G formula based on SCr of 1.09 mg/dL (H)). Liver Function Tests: Recent Labs  Lab 09/20/23 1850 09/21/23 0424 09/21/23 1505 09/22/23 0521 09/23/23 0516  AST 23 22 24 26 22   ALT 11 11 12 14 14   ALKPHOS 67 64 66 61 61  BILITOT 0.6 0.5 0.6 0.6 0.7  PROT 5.0* 4.9* 5.4* 5.3* 5.6*  ALBUMIN 1.9* 1.8* 2.1* 1.9* 2.1*   No results for input(s): LIPASE, AMYLASE in the last 168 hours. No results for input(s): AMMONIA in the last 168 hours. Coagulation Profile: Recent Labs  Lab 09/19/23 1632  INR 1.1   Cardiac Enzymes: No results for input(s):  CKTOTAL, CKMB, CKMBINDEX, TROPONINI in the last 168 hours. BNP (last 3 results) No results for input(s): PROBNP in the last 8760 hours. HbA1C: Recent Labs    09/21/23 0422  HGBA1C 6.3*   CBG: Recent Labs  Lab 09/21/23 2230 09/22/23 0937 09/22/23 1408 09/22/23 1938 09/22/23 2229  GLUCAP 136* 89 110* 115* 103*   Lipid Profile: No results for input(s): CHOL, HDL, LDLCALC, TRIG, CHOLHDL, LDLDIRECT in the last 72 hours. Thyroid  Function Tests: No results for input(s): TSH, T4TOTAL, FREET4, T3FREE, THYROIDAB in the last 72 hours. Anemia Panel: No results for input(s): VITAMINB12, FOLATE, FERRITIN, TIBC, IRON , RETICCTPCT in the last 72 hours. Sepsis Labs: Recent Labs  Lab 09/19/23 1632  PROCALCITON 0.24    Recent Results (from the past 240 hours)  Culture, Respiratory w Gram Stain     Status: None   Collection Time: 09/19/23 11:55 AM   Specimen: Tracheal Aspirate; Respiratory  Result Value Ref Range Status   Specimen Description TRACHEAL ASPIRATE  Final   Special Requests NONE  Final   Gram Stain   Final    MODERATE WBC PRESENT, PREDOMINANTLY PMN NO EPITHELIAL CELLS SEEN  NO ORGANISMS SEEN    Culture   Final    Normal respiratory flora-no Staph aureus or Pseudomonas seen Performed at [facility], [address] [place], [place] [address]    Report Status 09/21/2023 FINAL  Final  MRSA Next Gen by PCR, Nasal     Status: None   Collection Time: 09/19/23  7:30 PM   Specimen: Nasal Mucosa; Nasal Swab  Result Value Ref Range Status   MRSA by PCR Next Gen NOT DETECTED NOT DETECTED Final  Comment: (NOTE) The GeneXpert MRSA Assay (FDA approved for NASAL specimens only), is one component of a comprehensive MRSA colonization surveillance program. It is not intended to diagnose MRSA infection nor to guide or monitor treatment for MRSA infections. Test performance is not FDA approved in patients less than 30  years old. Performed at [facility], [address] [place], [place] [address]          Radiology Studies: CT Angio Chest Pulmonary Embolism (PE) W or WO Contrast Result Date: 09/22/2023 CLINICAL DATA:  Shortness of breath, tachycardia, hemoptysis. EXAM: CT ANGIOGRAPHY CHEST WITH CONTRAST TECHNIQUE: Multidetector CT imaging of the chest was performed using the standard protocol during bolus administration of intravenous contrast. Multiplanar CT image reconstructions and MIPs were obtained to evaluate the vascular anatomy. RADIATION DOSE REDUCTION: This exam was performed according to the departmental dose-optimization program which includes automated exposure control, adjustment of the mA and/or kV according to patient size and/or use of iterative reconstruction technique. CONTRAST:  50mL OMNIPAQUE  IOHEXOL  350 MG/ML SOLN COMPARISON:  March 26, 2005. FINDINGS: Cardiovascular: There is no evidence of large central pulmonary embolus seen in the main pulmonary artery or main portions of the proximal left and right pulmonary arteries. However, evaluation of smaller and more peripheral branches is limited due to body habitus, and therefore smaller and more peripheral pulmonary emboli cannot be excluded on the basis of this exam. Normal cardiac size. No pericardial effusion. Mediastinum/Nodes: No enlarged mediastinal, hilar, or axillary lymph nodes. Thyroid  gland, trachea, and esophagus demonstrate no significant findings. Lungs/Pleura: No pneumothorax or pleural effusion is noted. Large airspace opacities are seen in right upper and middle lobes consistent with pneumonia. Minimal left posterior basilar subsegmental atelectasis is noted. Upper Abdomen: Cholelithiasis. Musculoskeletal: No chest wall abnormality. No acute or significant osseous findings. Review of the MIP images confirms the above findings. IMPRESSION: 1. There is no definite evidence of large central pulmonary embolus seen in the  main pulmonary artery or main portions of the proximal left and right pulmonary arteries. However, evaluation of smaller and more peripheral branches is limited due to body habitus, and therefore smaller and more peripheral pulmonary emboli cannot be excluded on the basis of this exam. 2. Large airspace opacities are seen in right upper and middle lobes consistent with pneumonia. 3. Cholelithiasis. Electronically Signed   By: Lynwood Landy Raddle M.D.   On: 09/22/2023 12:20           LOS: 6 days   Time spent= 35 mins    Deliliah Room, MD [facility]  If 7PM-7AM, please contact night-coverage  09/23/2023, 11:49 AM

## 2023-09-23 NOTE — Progress Notes (Signed)
 Patient instructed that she needed to order breakfast/food. She has not eat all morning.

## 2023-09-23 NOTE — Progress Notes (Signed)
 Daily Postpartum Note  Admission Date: 09/17/2023 Current Date: 09/23/2023 9:20 AM  Laura Benson is a 33 y.o. G1P1001 POD#4 s/p primary low transverse c/s (prevena in place) at [redacted]w[redacted]d for failed induction of labor for CHTN/DM2.  Peripartum course c/b for developing superimposed severe pre-eclampsia (pulmonary edema, AKI), need for intubation  Pregnancy complicated by: Patient Active Problem List   Diagnosis Date Noted   Postoperative anemia due to acute blood loss 09/22/2023   Pneumonia 09/22/2023   Respiratory distress 09/19/2023   Type 2 diabetes mellitus during pregnancy 09/17/2023   Gestational diabetes mellitus (GDM) requiring insulin  06/08/2023   Poor fetal growth affecting management of mother in second trimester 05/25/2023   Alpha thalassemia silent carrier 04/08/2023   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   Supervision of high risk pregnancy, antepartum 03/17/2023   Morbid obesity (HCC) 03/17/2023   MDD (major depressive disorder), recurrent, severe, with psychosis (HCC) 08/31/2021   GAD (generalized anxiety disorder) 10/23/2020   Complication of anesthesia 09/04/2020   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 Hospitalist service consulted and patient started on unasyn   S/p 1U PRBC  Subjective:  Feeling better respiratory wise. +flatus, no nausea/vomiting. Foley still in place and hasn't ambulated yet today  Objective:    Current Vital Signs 24h Vital Sign Ranges  T 98.4 F (36.9 C) Temp   Avg: 98.5 F (36.9 C)  Min: 97.4 F (36.3 C)  Max: 99.2 F (37.3 C)  BP (!) 143/69 BP  Min: 121/65  Max: 143/69  HR (!) 104 Pulse  Avg: 103.4  Min: 93  Max: 117  RR 20 Resp  Avg: 21.1  Min: 20  Max: 24  SaO2 97 % Nasal Cannula SpO2  Avg: 97.1 %  Min: 89 %  Max: 100 %       24 Hour I/O Current Shift I/O  Time Ins Outs 08/26 0701 - 08/27 0700 In: 474.5  Out: 5950 [Urine:5950] No intake/output data recorded.   Patient Vitals for the past 24 hrs:  BP Temp Temp src Pulse Resp SpO2  09/23/23 0758 (!) 143/69 98.4 F (36.9 C) Oral (!) 104 -- --  09/23/23 0458 124/65 (!) 97.4 F (36.3 C) Oral (!) 107 20 97 %  09/23/23 0400 -- -- -- -- -- 96 %  09/23/23 0300 -- -- -- -- -- 97 %  09/23/23 0200 -- -- -- -- -- 99 %  09/23/23 0144 123/60 99.1 F (37.3 C) Oral (!) 104 (!) 22 99 %  09/23/23 0011 (!) 125/59 98.4 F (36.9 C) Oral (!) 117 -- 100 %  09/22/23 2300 -- -- -- -- -- 99 %  09/22/23 2252 121/65 98.5 F (36.9 C) Oral (!) 105 (!) 24 99 %  09/22/23 2221 126/64 98.4 F (36.9 C) Oral 99 (!) 21 98 %  09/22/23 2100 -- -- -- -- -- 98 %  09/22/23 2001 -- -- -- -- -- 100 %  09/22/23 1935 136/67 98.4 F (36.9 C) Oral 97 (!) 21 97 %  09/22/23 1707 139/69 98.7 F (37.1 C) Oral  93 20 97 %  09/22/23 1614 -- -- -- -- -- 92 %  09/22/23 1613 -- -- -- -- -- (!) 89 %  09/22/23 1252 138/72 99.2 F (37.3 C) Oral (!) 105 20 97 %    Physical exam: General: Well nourished, well developed female in no acute distress. Abdomen: obese, nttp, rare bowel sounds, prevena in place and functioning wnl Cardiovascular: S1, S2 normal, no murmur, rub or gallop, regular rate and rhythm Respiratory: CTAB Skin: Warm and dry.   Medications: Current Facility-Administered Medications  Medication Dose Route Frequency Provider Last Rate Last Admin   0.9 %  sodium chloride  infusion (Manually program via Guardrails IV Fluids)   Intravenous Once Kumar, Agnijita, MD       acetaminophen  (TYLENOL ) tablet 1,000 mg  1,000  mg Oral Q6H Cleatus Moccasin, MD   1,000 mg at 09/23/23 0815   albuterol  (PROVENTIL ) (2.5 MG/3ML) 0.083% nebulizer solution 2.5 mg  2.5 mg Nebulization Q4H PRN Cashion, Colter L, MD   2.5 mg at 09/19/23 9686   amLODipine  (NORVASC ) tablet 10 mg  10 mg Oral Daily Cleatus Moccasin, MD   10 mg at 09/22/23 1014   Ampicillin -Sulbactam (UNASYN ) 3 g in sodium chloride  0.9 % 100 mL IVPB  3 g Intravenous Q6H Perri DELENA Meliton Mickey., MD 200 mL/hr at 09/23/23 0819 3 g at 09/23/23 9180   buPROPion  (WELLBUTRIN  XL) 24 hr tablet 150 mg  150 mg Oral Daily Izell Harari, MD   150 mg at 09/22/23 1014   coconut oil  1 Application Topical PRN Sehgal, Sanchala, MD       cyclobenzaprine  (FLEXERIL ) tablet 10 mg  10 mg Oral TID PRN Cleatus Moccasin, MD       witch hazel-glycerin  (TUCKS) pad 1 Application  1 Application Topical PRN Sehgal, Sanchala, MD       And   dibucaine (NUPERCAINAL) 1 % rectal ointment 1 Application  1 Application Rectal PRN Sehgal, Sanchala, MD       enalapril  (VASOTEC ) tablet 5 mg  5 mg Oral Daily Izell Harari, MD   5 mg at 09/22/23 1555   enoxaparin  (LOVENOX ) injection 90 mg  90 mg Subcutaneous Q24H Sehgal, Sanchala, MD   90 mg at 09/22/23 1014   furosemide  (LASIX ) tablet 40 mg  40 mg Oral Daily Izell Harari, MD       gabapentin  (NEURONTIN ) capsule 100 mg  100 mg Oral TID Cleatus Moccasin, MD   100 mg at 09/22/23 2132   labetalol  (NORMODYNE ) injection 20 mg  20 mg Intravenous PRN Nicholaus Almarie HERO, MD   20 mg at 09/19/23 9573   And   labetalol  (NORMODYNE ) injection 40 mg  40 mg Intravenous PRN Nicholaus Almarie HERO, MD       And   labetalol  (NORMODYNE ) injection 80 mg  80 mg Intravenous PRN Nicholaus Almarie HERO, MD       And   hydrALAZINE  (APRESOLINE ) injection 10 mg  10 mg Intravenous PRN Nicholaus Almarie HERO, MD       hydrALAZINE  (APRESOLINE ) tablet 100 mg  100 mg Oral Q12H Izell Harari, MD   100 mg at 09/22/23 2132   insulin  aspart (novoLOG ) injection 0-5 Units  0-5 Units Subcutaneous QHS  Izell Harari, MD       insulin  aspart (novoLOG ) injection 0-9 Units  0-9 Units Subcutaneous TID WC Izell Harari, MD   1 Units at 09/21/23 1907   menthol -cetylpyridinium (CEPACOL) lozenge 3 mg  1 lozenge Oral Q2H PRN Sehgal, Sanchala, MD  3 mg at 09/23/23 9688   oxyCODONE  (Oxy IR/ROXICODONE ) immediate release tablet 5-10 mg  5-10 mg Oral Q4H PRN Cleatus Moccasin, MD   10 mg at 09/23/23 0549   pantoprazole  (PROTONIX ) EC tablet 40 mg  40 mg Oral Daily Izell Harari, MD   40 mg at 09/22/23 1014   [START ON 09/24/2023] polyethylene glycol (MIRALAX  / GLYCOLAX ) packet 17 g  17 g Oral QODAY Latoya Maulding, MD       potassium chloride  SA (KLOR-CON  M) CR tablet 40 mEq  40 mEq Oral Daily Izell Harari, MD       prenatal multivitamin tablet 1 tablet  1 tablet Oral Q1200 Cleatus Moccasin, MD   1 tablet at 09/22/23 1239   senna-docusate (Senokot-S) tablet 2 tablet  2 tablet Oral Daily Cleatus Moccasin, MD   2 tablet at 09/22/23 1013   simethicone  (MYLICON) chewable tablet 80 mg  80 mg Oral TID PC Cleatus Moccasin, MD   80 mg at 09/22/23 1733   simethicone  (MYLICON) chewable tablet 80 mg  80 mg Oral PRN Cleatus Moccasin, MD   80 mg at 09/21/23 0403   sodium chloride  flush (NS) 0.9 % injection 3 mL  3 mL Intravenous Q12H Izell Harari, MD   3 mL at 09/23/23 0809   sodium chloride  flush (NS) 0.9 % injection 3 mL  3 mL Intravenous PRN Izell Harari, MD   3 mL at 09/23/23 0808   Labs:  Recent Labs  Lab 09/21/23 0424 09/22/23 0521 09/23/23 0516  WBC 10.2 8.2 7.3  HGB 7.2* 7.4* 8.2*  HCT 22.3* 23.5* 25.0*  PLT 183 209 242   Recent Labs  Lab 09/21/23 1505 09/22/23 0521 09/23/23 0516  NA 140 138 140  K 3.9 4.0 3.8  CL 107 107 103  CO2 24 24 26   BUN 9 7 11   CREATININE 1.10* 1.02* 1.09*  CALCIUM 8.2* 8.3* 8.7*  PROT 5.4* 5.3* 5.6*  BILITOT 0.6 0.6 0.7  ALKPHOS 66 61 61  ALT 12 14 14   AST 24 26 22   GLUCOSE 149* 95 127*    Latest Reference Range & Units 09/21/23 03:32 09/21/23 08:16 09/21/23  14:01 09/21/23 19:02 09/21/23 22:30 09/22/23 09:37 09/22/23 14:08 09/22/23 19:38 09/22/23 22:29  Glucose-Capillary 70 - 99 mg/dL 871  88 893  865  863 89 110 115 103    Radiology:  No new imaging  Narrative & Impression  CLINICAL DATA:  Shortness of breath, tachycardia, hemoptysis.   EXAM: CT ANGIOGRAPHY CHEST WITH CONTRAST   TECHNIQUE: Multidetector CT imaging of the chest was performed using the standard protocol during bolus administration of intravenous contrast. Multiplanar CT image reconstructions and MIPs were obtained to evaluate the vascular anatomy.   RADIATION DOSE REDUCTION: This exam was performed according to the departmental dose-optimization program which includes automated exposure control, adjustment of the mA and/or kV according to patient size and/or use of iterative reconstruction technique.   CONTRAST:  50mL OMNIPAQUE  IOHEXOL  350 MG/ML SOLN   COMPARISON:  March 26, 2005.   FINDINGS: Cardiovascular: There is no evidence of large central pulmonary embolus seen in the main pulmonary artery or main portions of the proximal left and right pulmonary arteries. However, evaluation of smaller and more peripheral branches is limited due to body habitus, and therefore smaller and more peripheral pulmonary emboli cannot be excluded on the basis of this exam. Normal cardiac size. No pericardial effusion.   Mediastinum/Nodes: No enlarged mediastinal, hilar, or axillary lymph nodes. Thyroid  gland, trachea, and esophagus  demonstrate no significant findings.   Lungs/Pleura: No pneumothorax or pleural effusion is noted. Large airspace opacities are seen in right upper and middle lobes consistent with pneumonia. Minimal left posterior basilar subsegmental atelectasis is noted.   Upper Abdomen: Cholelithiasis.   Musculoskeletal: No chest wall abnormality. No acute or significant osseous findings.   Review of the MIP images confirms the above findings.    IMPRESSION: 1. There is no definite evidence of large central pulmonary embolus seen in the main pulmonary artery or main portions of the proximal left and right pulmonary arteries. However, evaluation of smaller and more peripheral branches is limited due to body habitus, and therefore smaller and more peripheral pulmonary emboli cannot be excluded on the basis of this exam. 2. Large airspace opacities are seen in right upper and middle lobes consistent with pneumonia. 3. Cholelithiasis.     Electronically Signed   By: Lynwood Landy Raddle M.D.   On: 09/22/2023 12:20    Assessment & Plan:  Patient stable *Postpartum: foley currently in and will d/c later today even if patient needs purewick -O POS/boy, needs circ (consented)/rpr neg/birth control: TBD/breast feeding planned *CV, severe pre-eclampsia: following with her primary cardiologist Dr. Sheena. Continue current BP regimen; pt switched from labetalol  to enalapril  on 8/26, to hopefully help with her respiratory status. -8/25 echo wnl *Pulm: pt stable on 2L [place]. Continue with cpap with sleeping. Unasyn  D#2 for PNA. Hospitalist team following, appreciate recs *DM2: DM diet ordered with ACHS checks with SSI. DM team following *Anemia: follow up s/s and with PT -s/p 1U PRBC on 8/27 and IV iron  on 8/26  *AKI: stable to resolving.  *History of sleeve: no current issues *BMI 69: PT following. Will likely need to go home on 3-4wks of ppx lovenox . Prevena in place.  *PPx: lovenox , OOB, PPI *FEN/GI: SLIV, DM diet *Dispo: likely pod#5-7  Bebe Izell Raddle MD Attending Center [facility]Fac[facility]) GYN Consult Phone: [phone] (M-F, [phone]) & [phone]  (Off hours, weekends, holidays)

## 2023-09-23 NOTE — Plan of Care (Signed)
  Problem: Health Behavior/Discharge Planning: Goal: Ability to manage health-related needs will improve Outcome: Progressing   Problem: Clinical Measurements: Goal: Respiratory complications will improve Outcome: Progressing   Problem: Nutrition: Goal: Adequate nutrition will be maintained Outcome: Progressing Note: Ensure ordered to assist    Problem: Pain Managment: Goal: General experience of comfort will improve and/or be controlled Outcome: Progressing

## 2023-09-23 NOTE — Progress Notes (Signed)
 OB Note Foley not draining and not real UOP since last night. Scant normal UOP in tube and foley. I back clamped the drain tube and flushed and then removed her bladder with 45mL of sterile fluid. Foley began to freely drain normal UOP after that  Bebe Izell Raddle MD Attending Center for [employer] (Faculty Practice) 09/23/2023 Time: 1020am

## 2023-09-23 NOTE — Progress Notes (Signed)
 BLE venous exam is completed. Bronda Alfred, RVT

## 2023-09-23 NOTE — Progress Notes (Signed)
 Educated patient on self-administration of Lovenox . Patient states that she has done that before when she had her gastric sleeve. Patient administering Lovenox  using good technique with RN at bedside.

## 2023-09-24 ENCOUNTER — Inpatient Hospital Stay (HOSPITAL_COMMUNITY)

## 2023-09-24 DIAGNOSIS — R0603 Acute respiratory distress: Secondary | ICD-10-CM | POA: Diagnosis not present

## 2023-09-24 DIAGNOSIS — M7989 Other specified soft tissue disorders: Secondary | ICD-10-CM | POA: Diagnosis not present

## 2023-09-24 DIAGNOSIS — I82612 Acute embolism and thrombosis of superficial veins of left upper extremity: Secondary | ICD-10-CM | POA: Insufficient documentation

## 2023-09-24 LAB — COMPREHENSIVE METABOLIC PANEL WITH GFR
ALT: 14 U/L (ref 0–44)
AST: 18 U/L (ref 15–41)
Albumin: 2.3 g/dL — ABNORMAL LOW (ref 3.5–5.0)
Alkaline Phosphatase: 63 U/L (ref 38–126)
Anion gap: 9 (ref 5–15)
BUN: 12 mg/dL (ref 6–20)
CO2: 27 mmol/L (ref 22–32)
Calcium: 8.9 mg/dL (ref 8.9–10.3)
Chloride: 103 mmol/L (ref 98–111)
Creatinine, Ser: 0.94 mg/dL (ref 0.44–1.00)
GFR, Estimated: >60 mL/min (ref >60)
Glucose, Bld: 107 mg/dL — ABNORMAL HIGH (ref 70–99)
Potassium: 4 mmol/L (ref 3.5–5.1)
Sodium: 139 mmol/L (ref 135–145)
Total Bilirubin: 0.5 mg/dL (ref 0.0–1.2)
Total Protein: 6.1 g/dL — ABNORMAL LOW (ref 6.5–8.1)

## 2023-09-24 LAB — CBC
HCT: 26.5 % — ABNORMAL LOW (ref 36.0–46.0)
Hemoglobin: 8.5 g/dL — ABNORMAL LOW (ref 12.0–15.0)
MCH: 24.9 pg — ABNORMAL LOW (ref 26.0–34.0)
MCHC: 32.1 g/dL (ref 30.0–36.0)
MCV: 77.7 fL — ABNORMAL LOW (ref 80.0–100.0)
Platelets: 270 K/uL (ref 150–400)
RBC: 3.41 MIL/uL — ABNORMAL LOW (ref 3.87–5.11)
RDW: 14.4 % (ref 11.5–15.5)
WBC: 7.4 K/uL (ref 4.0–10.5)
nRBC: 0 % (ref 0.0–0.2)

## 2023-09-24 LAB — GLUCOSE, CAPILLARY
Glucose-Capillary: 190 mg/dL — ABNORMAL HIGH (ref 70–99)
Glucose-Capillary: 125 mg/dL — ABNORMAL HIGH (ref 70–99)
Glucose-Capillary: 109 mg/dL — ABNORMAL HIGH (ref 70–99)
Glucose-Capillary: 97 mg/dL (ref 70–99)
Glucose-Capillary: 120 mg/dL — ABNORMAL HIGH (ref 70–99)
Glucose-Capillary: 96 mg/dL (ref 70–99)

## 2023-09-24 LAB — BRAIN NATRIURETIC PEPTIDE: B Natriuretic Peptide: 10.5 pg/mL (ref 0.0–100.0)

## 2023-09-24 MED ORDER — IRON SUCROSE 500 MG IVPB - SIMPLE MED: 500.0000 mg | Freq: Once | INTRAVENOUS | Status: DC

## 2023-09-24 MED ORDER — ENOXAPARIN SODIUM 100 MG/ML IJ SOSY
100.0000 mg | PREFILLED_SYRINGE | Freq: Once | INTRAMUSCULAR | Status: AC
  Administered 2023-09-24: 100 mg via SUBCUTANEOUS
  Filled 2023-09-24: qty 1

## 2023-09-24 MED ORDER — ENOXAPARIN SODIUM 300 MG/3ML IJ SOLN
200.0000 mg | Freq: Two times a day (BID) | INTRAMUSCULAR | Status: DC
  Administered 2023-09-25 (×2): 200 mg via SUBCUTANEOUS
  Filled 2023-09-24 (×4): qty 2

## 2023-09-24 MED ORDER — KETOROLAC TROMETHAMINE 30 MG/ML IJ SOLN
30.0000 mg | Freq: Once | INTRAMUSCULAR | Status: AC
  Administered 2023-09-24: 30 mg via INTRAVENOUS
  Filled 2023-09-24: qty 1

## 2023-09-24 NOTE — Progress Notes (Signed)
 Physical Therapy Treatment Patient Details Name: Laura Benson MRN: [id number] DOB: [birth year] Today's Date: 09/24/2023   History of Present Illness Pt is 33 year old presented to [facility] on  09/17/23 for induction of labor due to uncontrolled HTN. Pt developed pulmonary edema and AKI and underwent csection on 09/19/23. Remainined intubated until 09/20/23. 8/26 CT PE shows possible PNA. LUE with SVT cephalic vein on 1/71, okay for mobility per MD and on lovenox . PMH - IDDM, severe obesity BMI 69, OSA, PCOS, HTN, asthma.    PT Comments  Pt reclined in bed holding baby upon PT arrival to room, pt agreeable to OOB mobility with mobility okayed by MD given recent superficial vein thrombosis finding. Pt progressing well with activity tolerance, continues to present with significant dyspnea on exertion with accompanying desats and tachycardia up to 138 bpm but recovers well with pursed lip breathing cues and standing rest (see below). Of note, pt states she was short of breath with mobility prior to pregnancy as well. Pt not requiring RW at this time, anticipate continued steady progress with mobility once home. PT to continue to follow.   SATURATION QUALIFICATIONS: (This note is used to comply with regulatory documentation for home oxygen)  Patient Saturations on Room Air at Rest = 95%  Patient Saturations on Room Air while Ambulating = 90%  Patient Saturations on 2 Liters of oxygen while Ambulating = 92-95%  Please briefly explain why patient needs home oxygen: to maintain SPO2 >=92%      If plan is discharge home, recommend the following: A little help with walking and/or transfers;A little help with bathing/dressing/bathroom;Assist for transportation;Help with stairs or ramp for entrance;Assistance with cooking/housework   Can travel by private Scientist, research (medical) walker (2 wheels) (bariatric)    Recommendations for Other Services       Precautions /  Restrictions Precautions Precautions: Fall Precaution/Restrictions Comments: wound VAC, 1LO2 at rest and requires 2LO2 during gait Restrictions Weight Bearing Restrictions Per Provider Order: No     Mobility  Bed Mobility Overal bed mobility: Needs Assistance Bed Mobility: Rolling, Sidelying to Sit Rolling: Min assist Sidelying to sit: Min assist, HOB elevated, Used rails       General bed mobility comments: assist for truncal translation and trunk rise via HHA, increased time and cues for sequencing    Transfers Overall transfer level: Needs assistance Equipment used: None Transfers: Sit to/from Stand Sit to Stand: Supervision           General transfer comment: for safety, slow to rise and steady but no physical assist    Ambulation/Gait Ambulation/Gait assistance: Contact guard assist Gait Distance (Feet): 150 Feet (x3 standing rest breaks to recover fatigue) Assistive device: None Gait Pattern/deviations: Step-through pattern, Decreased stride length, Trunk flexed Gait velocity: decr     General Gait Details: cues for upright posture, no RW needed but reaches for environment to self-steady. DOE 2/4 when trialling RA, drop to 91% with HR to 138 bpm so placed on 1LO2. Continued desat <92%, maintained SPO2 >92% on 2LO2 during gait.   Stairs             Wheelchair Mobility     Tilt Bed    Modified Rankin (Stroke Patients Only)       Balance Overall balance assessment: Needs assistance Sitting-balance support: No upper extremity supported, Feet supported Sitting balance-Leahy Scale: Fair     Standing balance support: During functional activity, No upper extremity  supported Standing balance-Leahy Scale: Fair                              Hotel manager: No apparent difficulties  Cognition Arousal: Alert Behavior During Therapy: WFL for tasks assessed/performed   PT - Cognitive impairments: No apparent  impairments                         Following commands: Intact      Cueing Cueing Techniques: Verbal cues  Exercises      General Comments        Pertinent Vitals/Pain Pain Assessment Pain Assessment: Faces Faces Pain Scale: Hurts little more Pain Location: abdomen, LUE Pain Descriptors / Indicators: Burning, Sore Pain Intervention(s): Limited activity within patient's tolerance, Monitored during session, Repositioned    Home Living                          Prior Function            PT Goals (current goals can now be found in the care plan section) Acute Rehab PT Goals Patient Stated Goal: return home with baby PT Goal Formulation: With patient Time For Goal Achievement: 10/05/23 Potential to Achieve Goals: Good Progress towards PT goals: Progressing toward goals    Frequency    Min 3X/week      PT Plan      Co-evaluation              AM-PAC PT 6 Clicks Mobility   Outcome Measure  Help needed turning from your back to your side while in a flat bed without using bedrails?: A Little Help needed moving from lying on your back to sitting on the side of a flat bed without using bedrails?: A Little Help needed moving to and from a bed to a chair (including a wheelchair)?: A Little Help needed standing up from a chair using your arms (e.g., wheelchair or bedside chair)?: A Little Help needed to walk in hospital room?: A Little Help needed climbing 3-5 steps with a railing? : A Lot 6 Click Score: 17    End of Session Equipment Utilized During Treatment: Oxygen Activity Tolerance: Patient tolerated treatment well Patient left: with call bell/phone within reach;with family/visitor present;in bed;Other (comment) (lactation consultant, s/o, and baby at bedside) Nurse Communication: Mobility status;Other (comment) (O2 requirements) PT Visit Diagnosis: Other abnormalities of gait and mobility (R26.89);Muscle weakness (generalized)  (M62.81);Difficulty in walking, not elsewhere classified (R26.2);Pain Pain - part of body:  (abdomen)     Time: 8599-8575 PT Time Calculation (min) (ACUTE ONLY): 24 min  Charges:    $Gait Training: 8-22 mins $Therapeutic Activity: 8-22 mins PT General Charges $$ ACUTE PT VISIT: 1 Visit                     Laura Benson, PT DPT Acute Rehabilitation Services Secure Chat Preferred  Office [phone]    Beva Remund E Johna 09/24/2023, 2:54 PM

## 2023-09-24 NOTE — Progress Notes (Signed)
 Daily Postpartum Note  Admission Date: 09/17/2023 Current Date: 09/24/2023 4:55 PM  Laura Benson is a 33 y.o. G1P1001 POD#5 s/p primary low transverse c/s (prevena in place) at [redacted]w[redacted]d for failed induction of labor for CHTN/DM2.  Peripartum course c/b for developing superimposed severe pre-eclampsia (pulmonary edema, AKI), need for intubation. Noted to have new onset LUE swelling and tenderness  Pregnancy complicated by: Patient Active Problem List   Diagnosis Date Noted   Superficial venous thrombosis of arm, left 09/24/2023   Postoperative anemia due to acute blood loss 09/22/2023   Pneumonia 09/22/2023   Respiratory distress 09/19/2023   Type 2 diabetes mellitus during pregnancy 09/17/2023   Gestational diabetes mellitus (GDM) requiring insulin  06/08/2023   Poor fetal growth affecting management of mother in second trimester 05/25/2023   Alpha thalassemia silent carrier 04/08/2023   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   Supervision of high risk pregnancy, antepartum 03/17/2023   Morbid obesity (HCC) 03/17/2023   MDD (major depressive disorder), recurrent, severe, with psychosis (HCC) 08/31/2021   GAD (generalized anxiety disorder) 10/23/2020   Complication of anesthesia 09/04/2020   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 +flatus, no nausea/vomiting. Foley still in place and hasn't ambulated yet today  Objective:    Current Vital Signs 24h Vital Sign Ranges  T 99 F (37.2 C) Temp  Avg: 98.9 F  (37.2 C)  Min: 98.8 F (37.1 C)  Max: 99 F (37.2 C)  BP 125/62 BP  Min: 125/62  Max: 156/82  HR (!) 108 Pulse  Avg: 110.2  Min: 104  Max: 124  RR (!) 40 (counted full minute) Resp  Avg: 25.5  Min: 20  Max: 40  SaO2 100 % Nasal Cannula SpO2  Avg: 97.5 %  Min: 92 %  Max: 100 %       24 Hour I/O Current Shift I/O  Time Ins Outs 08/27 0701 - 08/28 0700 In: 499.5  Out: 2700 [Urine:2700] No intake/output data recorded.   Patient Vitals for the past 24 hrs:  BP Temp Temp src Pulse Resp SpO2  09/24/23 1553 -- -- -- (!) 108 -- 100 %  09/24/23 1118 125/62 99 F (37.2 C) Oral (!) 108 (!) 40 98 %  09/24/23 0908 (!) 156/82 98.8 F (37.1 C) Oral (!) 107 20 --  09/24/23 0527 -- -- -- -- -- 92 %  09/24/23 0030 -- -- -- -- -- 97 %  09/24/23 0028 (!) 135/59 98.8 F (37.1 C) Oral (!) 124 (!) 22 --  09/23/23 1953 134/71 98.8 F (37.1 C) Oral (!) 104 20 99 %  09/23/23 1900 -- -- -- -- -- 99 %    Physical exam: General: Well nourished, well developed female in no acute distress. Abdomen: obese, nttp, rare bowel sounds, prevena in place and functioning wnl Cardiovascular: S1, S2 normal, no murmur, rub or gallop, regular rate and rhythm Respiratory: CTAB Skin: Warm and dry.   Medications: Current Facility-Administered Medications  Medication Dose Route Frequency Provider Last Rate Last Admin   0.9 %  sodium chloride  infusion (Manually program  via Guardrails IV Fluids)   Intravenous Once Kumar, Agnijita, MD       acetaminophen  (TYLENOL ) tablet 1,000 mg  1,000 mg Oral Q6H Cleatus Moccasin, MD   1,000 mg at 09/24/23 1605   albuterol  (PROVENTIL ) (2.5 MG/3ML) 0.083% nebulizer solution 2.5 mg  2.5 mg Nebulization Q4H PRN Cashion, Colter L, MD   2.5 mg at 09/19/23 9686   amLODipine  (NORVASC ) tablet 10 mg  10 mg Oral Daily Cleatus Moccasin, MD   10 mg at 09/24/23 1112   Ampicillin -Sulbactam (UNASYN ) 3 g in sodium chloride  0.9 % 100 mL IVPB  3 g Intravenous Q6H Perri DELENA Meliton Mickey., MD 200 mL/hr at 09/24/23  1310 3 g at 09/24/23 1310   buPROPion  (WELLBUTRIN  XL) 24 hr tablet 150 mg  150 mg Oral Daily Izell Harari, MD   150 mg at 09/24/23 0913   coconut oil  1 Application Topical PRN Sehgal, Sanchala, MD       cyclobenzaprine  (FLEXERIL ) tablet 10 mg  10 mg Oral TID PRN Cleatus Moccasin, MD       witch hazel-glycerin  (TUCKS) pad 1 Application  1 Application Topical PRN Sehgal, Sanchala, MD       And   dibucaine (NUPERCAINAL) 1 % rectal ointment 1 Application  1 Application Rectal PRN Sehgal, Sanchala, MD       enalapril  (VASOTEC ) tablet 5 mg  5 mg Oral Daily Izell Harari, MD   5 mg at 09/24/23 0914   [START ON 09/25/2023] enoxaparin  (LOVENOX ) 100 mg/mL injection 200 mg  200 mg Subcutaneous Q12H [name], MD       feeding supplement (ENSURE PLUS HIGH PROTEIN) liquid 237 mL  237 mL Oral BID BM Arnold, James G, MD   237 mL at 09/24/23 0915   furosemide  (LASIX ) tablet 40 mg  40 mg Oral Daily Izell Harari, MD   40 mg at 09/24/23 9085   gabapentin  (NEURONTIN ) capsule 100 mg  100 mg Oral TID Cleatus Moccasin, MD   100 mg at 09/24/23 1605   labetalol  (NORMODYNE ) injection 20 mg  20 mg Intravenous PRN Nicholaus Almarie HERO, MD   20 mg at 09/19/23 9573   And   labetalol  (NORMODYNE ) injection 40 mg  40 mg Intravenous PRN Nicholaus Almarie HERO, MD       And   labetalol  (NORMODYNE ) injection 80 mg  80 mg Intravenous PRN Nicholaus Almarie HERO, MD       And   hydrALAZINE  (APRESOLINE ) injection 10 mg  10 mg Intravenous PRN Nicholaus Almarie HERO, MD       hydrALAZINE  (APRESOLINE ) tablet 100 mg  100 mg Oral Q12H Izell Harari, MD   100 mg at 09/24/23 9085   insulin  aspart (novoLOG ) injection 0-5 Units  0-5 Units Subcutaneous QHS Izell Harari, MD       insulin  aspart (novoLOG ) injection 0-9 Units  0-9 Units Subcutaneous TID WC Izell Harari, MD   1 Units at 09/21/23 1907   menthol -cetylpyridinium (CEPACOL) lozenge 3 mg  1 lozenge Oral Q2H PRN Sehgal, Sanchala, MD   3 mg at 09/24/23 1304   oxyCODONE   (Oxy IR/ROXICODONE ) immediate release tablet 5-10 mg  5-10 mg Oral Q4H PRN Cleatus Moccasin, MD   10 mg at 09/23/23 1900   pantoprazole  (PROTONIX ) EC tablet 40 mg  40 mg Oral Daily Izell Harari, MD   40 mg at 09/24/23 0913   polyethylene glycol (MIRALAX  / GLYCOLAX ) packet 17 g  17 g Oral QODAY Pickens, Charlie, MD   17 g  at 09/24/23 0914   potassium chloride  SA (KLOR-CON  M) CR tablet 40 mEq  40 mEq Oral Daily Izell Harari, MD   40 mEq at 09/24/23 0914   prenatal multivitamin tablet 1 tablet  1 tablet Oral Q1200 Cleatus Moccasin, MD   1 tablet at 09/24/23 1112   senna-docusate (Senokot-S) tablet 2 tablet  2 tablet Oral Daily Cleatus Moccasin, MD   2 tablet at 09/24/23 0914   simethicone  (MYLICON) chewable tablet 80 mg  80 mg Oral TID BUREN Cleatus Moccasin, MD   80 mg at 09/24/23 1305   simethicone  (MYLICON) chewable tablet 80 mg  80 mg Oral PRN Cleatus Moccasin, MD   80 mg at 09/21/23 0403   sodium chloride  flush (NS) 0.9 % injection 3 mL  3 mL Intravenous Q12H Izell Harari, MD   3 mL at 09/23/23 2058   sodium chloride  flush (NS) 0.9 % injection 3 mL  3 mL Intravenous PRN Izell Harari, MD   3 mL at 09/23/23 0808   Labs:  Recent Labs  Lab 09/22/23 0521 09/23/23 0516 09/24/23 0709  WBC 8.2 7.3 7.4  HGB 7.4* 8.2* 8.5*  HCT 23.5* 25.0* 26.5*  PLT 209 242 270   Recent Labs  Lab 09/22/23 0521 09/23/23 0516 09/24/23 0709  NA 138 140 139  K 4.0 3.8 4.0  CL 107 103 103  CO2 24 26 27   BUN 7 11 12   CREATININE 1.02* 1.09* 0.94  CALCIUM 8.3* 8.7* 8.9  PROT 5.3* 5.6* 6.1*  BILITOT 0.6 0.7 0.5  ALKPHOS 61 61 63  ALT 14 14 14   AST 26 22 18   GLUCOSE 95 127* 107*    Latest Reference Range & Units 09/21/23 03:32 09/21/23 08:16 09/21/23 14:01 09/21/23 19:02 09/21/23 22:30 09/22/23 09:37 09/22/23 14:08 09/22/23 19:38 09/22/23 22:29  Glucose-Capillary 70 - 99 mg/dL 871  88 893  865  863 89 110 115 103    Radiology:  No new imaging  Narrative & Impression  CLINICAL DATA:  Shortness of breath,  tachycardia, hemoptysis.   EXAM: CT ANGIOGRAPHY CHEST WITH CONTRAST   TECHNIQUE: Multidetector CT imaging of the chest was performed using the standard protocol during bolus administration of intravenous contrast. Multiplanar CT image reconstructions and MIPs were obtained to evaluate the vascular anatomy.   RADIATION DOSE REDUCTION: This exam was performed according to the departmental dose-optimization program which includes automated exposure control, adjustment of the mA and/or kV according to patient size and/or use of iterative reconstruction technique.   CONTRAST:  50mL OMNIPAQUE  IOHEXOL  350 MG/ML SOLN   COMPARISON:  March 26, 2005.   FINDINGS: Cardiovascular: There is no evidence of large central pulmonary embolus seen in the main pulmonary artery or main portions of the proximal left and right pulmonary arteries. However, evaluation of smaller and more peripheral branches is limited due to body habitus, and therefore smaller and more peripheral pulmonary emboli cannot be excluded on the basis of this exam. Normal cardiac size. No pericardial effusion.   Mediastinum/Nodes: No enlarged mediastinal, hilar, or axillary lymph nodes. Thyroid  gland, trachea, and esophagus demonstrate no significant findings.   Lungs/Pleura: No pneumothorax or pleural effusion is noted. Large airspace opacities are seen in right upper and middle lobes consistent with pneumonia. Minimal left posterior basilar subsegmental atelectasis is noted.   Upper Abdomen: Cholelithiasis.   Musculoskeletal: No chest wall abnormality. No acute or significant osseous findings.   Review of the MIP images confirms the above findings.   IMPRESSION: 1. There is no definite  evidence of large central pulmonary embolus seen in the main pulmonary artery or main portions of the proximal left and right pulmonary arteries. However, evaluation of smaller and more peripheral branches is limited due to body  habitus, and therefore smaller and more peripheral pulmonary emboli cannot be excluded on the basis of this exam. 2. Large airspace opacities are seen in right upper and middle lobes consistent with pneumonia. 3. Cholelithiasis.     Electronically Signed   By: Lynwood Landy Raddle M.D.   On: 09/22/2023 12:20    Assessment & Plan:   *Acute LUE swelling - Concerns for DVT or SVT - Dopplers ordered Has been on appropriately dosed lovenox  PPX  *Postpartum: foley currently in and will d/c later today even if patient needs purewick -O POS/boy, needs circ (consented)/rpr neg/birth control: TBD/breast feeding planned  *CV, severe pre-eclampsia: following with her primary cardiologist Dr. Sheena. Continue current BP regimen; pt switched from labetalol  to enalapril  on 8/26, to hopefully help with her respiratory status. -8/25 echo wnl - low threshold to repeat if persistent tachypnea or elevated BNP - BNP ordered today  *Pulm: pt stable on 2L [place]. Continue with cpap with sleeping. Unasyn  D#3 for PNA. Hospitalist team following, appreciate recs  *DM2: DM diet ordered with ACHS checks with SSI. DM team following  *Anemia: follow up s/s and with PT -s/p 1U PRBC on 8/27 and IV iron  on 8/26   *AKI: stable to resolving.  *History of sleeve: no current issues *BMI 69: PT following. Will likely need to go home on 3-4wks of ppx lovenox . Prevena in place.  *PPx: lovenox , OOB, PPI *FEN/GI: SLIV, DM diet *Dispo: likely pod#5-7  Suzen Maryan Masters, MD Attending Center for [facility] (Faculty Practice) GYN Consult Phone: [phone] (M-F, [phone]) & [phone]  (Off hours, weekends, holidays)

## 2023-09-24 NOTE — Lactation Note (Signed)
 This note was copied from a baby's chart. Lactation Consultation Note  Patient Name: Laura Benson Date: 09/24/2023 Age:34 days Reason for consult: Follow-up assessment;Primapara;1st time breastfeeding;Maternal endocrine disorder;Early term 69-38.6wks;Other (Comment);Infant < 5lbs (cHTN, SGA)  Visited with family of 36 8/56 weeks old female Laura Benson; [name] reported she hasn't had a chance to initiate pumping yet because there was so much going on. Assisted with the initiation of pumping while on [facility] consult, she's already getting enough volume to collect, praised her for her efforts. Breast are still soft, but start feeling a bit fuller today, no S/S of engorgement at this time. Explained pump settings and the importance of consistent pumping for the onset of lactogenesis II and the prevention of engorgement. Baby Laura Benson has been eating +30 ml per feeding from the bottle and keeps gaining weight daily. Asked family to call for assistance when needed.  Feeding Mother's Current Feeding Choice: Breast Milk and Formula Nipple Type: Dr. Jonna Fling Preemie  Lactation Tools Discussed/Used Tools: Pump;Flanges Flange Size: 21 Breast pump type: Double-Electric Breast Pump Pump Education: Setup, frequency, and cleaning;Milk Storage Reason for Pumping: ETI < 5 LBS, 1529 cc PPH Pumping frequency: Initiated pumping at 5 days post-partum Pumped volume: 10 mL  Interventions Interventions: Breast feeding basics reviewed;DEBP;Education  Plan STS whenever possible Pump both breasts on maintain mode every 3 hours for 15-30 minutes or whenever Laura Benson is having a bottle, ideally 8 pumping sessions/24 hours Supplement with EBM/Similac 22 calorie formula every 3 hours, baby taking + 30 ml per feeding already   FOB present and supportive. All questions and concerns answered, family to contact [facility] services PRN.   Consult Status Consult Status: Follow-up Date: 09/24/23 Follow-up type:  In-patient   Laura Benson 09/24/2023, 2:38 PM

## 2023-09-24 NOTE — Progress Notes (Addendum)
 PROGRESS NOTE    Laura Benson  FMW:[id number] DOB: July 31, 1990 DOA: 09/17/2023 PCP: [facility]   Brief Narrative:    33 y.o. female with medical history significant of T2DM on insulin  pump, obesity, OSA, HTN, depression/anxiety who was admitted for induction of labor.     Her course was complicated by acute hypoxic respiratory failure due to acute pulmonary edema in the setting of uncontrolled hypertension requiring bipap.  She was intubated for c section and remained intubated post op with critical care following.  She was extubated 8/24.  Since her extubation, they've had issues with weaning her oxygen.  Today she had some small volume hemoptysis and OB obtained a CT PE protocol which showed evidence of pneumonia.  The hospitalists have been consulted for assistance with management of pneumonia.    Clinically improving.  Significant Events 8/21 presented for IOL. Insulin  gtt 8/22 epidural  8/23 new O2 req, progressive SOB, NRB --> BiPAP. Diuresing. PCCM consult  8/23 primary low transverse C section with vacuum assistance with kiwi vacuum and IUD insertion - she was intubated for C section and remained intubated post op 8/24 extubated 8/26 small volume hemoptysis, CT PE protocol with pneumonia - hospitalists consulted, started on IV Unasyn   Assessment & Plan:  Principal Problem:   Type 2 diabetes mellitus during pregnancy Active Problems:   Severe obesity (BMI >= 40) (HCC)   Chronic hypertension in pregnancy   GAD (generalized anxiety disorder)   Anti M Antibody positive   Respiratory distress   Postoperative anemia due to acute blood loss   Pneumonia    Right upper and middle lobe possible bacterial community acquired Pneumonia : CT scan chest with right upper and middle lobe opacities c/w pneumonia (CXR 8/23 with opacity of R lung fields due to multilobar pneumonia) Will treat with unasyn .  Plan for 7 day course of abx.   Negative MRSA PCR.  Respiratory  culture from 8/23 with normal resp flora.   Follow sputum cx- No organisms seen Repeat CXR in 4 weeks as an out patient.   Acute Hypoxic and Hypercapneic Respiratory Failure  Shortness of breath Hypoxia likely due to pneumonia above.  DOE related to pneumonia, but suspect also related to anemia. Extubated 8/24 with acute hypoxic respiratory failure in the setting of pulmonary edema Echo with preserved EF, no RWMA, mild LVH, normal RVSF Normal BNP (can be falsely low in the setting of class III Obesity ), continue oral lasix  for now  Pulmonary critical care signed off on 8/24 for resp failure with hypoxia/hypercapnia, pulmonary edema, OSA, obesity - they recommended continuing nightly bipap   Hemoptysis, resolved Small volume, likely explained by the pneumonia above CT is without large central pulm embolus - evaluation of small/peripheral branches limited due to body habitus  No evidence of DVT on LE ultrasound   Symptomatic Anemia S/p IV iron  and PRBC    Sinus Tachycardia Due to above    AKI Improved  G1P1001 s/p primary low transverse C section at [redacted]w[redacted]d for failed IOL for chronic HTN and T2DM  Severe preeclampsia with need for intubation (pulmonary edema, AKI) S/p c section 8/23 Post partum course per OB, primary Prevena in place May need to be dced on lovenox  for 3-4 weeks   T2DM On insulin  pump at home Continue SSI as ordered   Hypertension Enalapril , hydralazine , amlodipine   Major moderate Depression  Anxiety Wellbutrin   Class III Obesity: h/o sleeve gastrectomy    DVT prophylaxis: Lovenox   Code Status: Prior Family Communication:  Family at bedside Status is: Inpatient Remains inpatient appropriate because: PNA    Subjective:  No acute events overnight. LE US  didn't show any blood clots.  Examination:  General exam: Appears calm and comfortable, nasal oxygen in place at 1 L/min Respiratory system: Clear to auscultation. Respiratory effort  normal. Cardiovascular system: S1 & S2 heard, RRR. No JVD, murmurs, rubs, gallops or clicks. No pedal edema. Gastrointestinal system: Abdomen is nondistended, soft and nontender. No organomegaly or masses felt. Normal bowel sounds heard. Central nervous system: Alert and oriented. No focal neurological deficits. Extremities: Symmetric 5 x 5 power. Skin: No rashes, lesions or ulcers Psychiatry: Judgement and insight appear normal. Mood & affect appropriate.       Diet Orders (From admission, onward)     Start     Ordered   09/21/23 0832  Diet Carb Modified Fluid consistency: Thin; Room service appropriate? Yes  Diet effective now       Question Answer Comment  Diet-HS Snack? Nothing   Calorie Level Medium 1600-2000   Fluid consistency: Thin   Room service appropriate? Yes      09/21/23 0832            Objective: Vitals:   09/24/23 0028 09/24/23 0030 09/24/23 0527 09/24/23 0908  BP: (!) 135/59   (!) 156/82  Pulse: (!) 124   (!) 107  Resp: (!) 22   20  Temp: 98.8 F (37.1 C)   98.8 F (37.1 C)  TempSrc: Oral   Oral  SpO2:  97% 92%   Weight:      Height:        Intake/Output Summary (Last 24 hours) at 09/24/2023 1116 Last data filed at 09/24/2023 0909 Gross per 24 hour  Intake 499.49 ml  Output 2700 ml  Net -2200.51 ml   Filed Weights   09/17/23 1117  Weight: (!) 193.6 kg    Scheduled Meds:  sodium chloride    Intravenous Once   acetaminophen   1,000 mg Oral Q6H   amLODipine   10 mg Oral Daily   buPROPion   150 mg Oral Daily   enalapril   5 mg Oral Daily   enoxaparin  (LOVENOX ) injection  90 mg Subcutaneous Q24H   feeding supplement  237 mL Oral BID BM   furosemide   40 mg Oral Daily   gabapentin   100 mg Oral TID   hydrALAZINE   100 mg Oral Q12H   insulin  aspart  0-5 Units Subcutaneous QHS   insulin  aspart  0-9 Units Subcutaneous TID WC   pantoprazole   40 mg Oral Daily   polyethylene glycol  17 g Oral QODAY   potassium chloride   40 mEq Oral Daily   prenatal  multivitamin  1 tablet Oral Q1200   senna-docusate  2 tablet Oral Daily   simethicone   80 mg Oral TID PC   sodium chloride  flush  3 mL Intravenous Q12H   Continuous Infusions:  ampicillin -sulbactam (UNASYN ) IV 3 g (09/24/23 0800)    Nutritional status     Body mass index is 68.89 kg/m.  Data Reviewed:   CBC: Recent Labs  Lab 09/20/23 0853 09/21/23 0424 09/22/23 0521 09/23/23 0516 09/24/23 0709  WBC 14.7* 10.2 8.2 7.3 7.4  NEUTROABS 11.7*  --   --   --   --   HGB 9.1* 7.2* 7.4* 8.2* 8.5*  HCT 28.7* 22.3* 23.5* 25.0* 26.5*  MCV 77.2* 76.4* 76.8* 76.7* 77.7*  PLT 200 183 209 242 270   Basic Metabolic  Panel: Recent Labs  Lab 09/19/23 0411 09/19/23 0805 09/21/23 0424 09/21/23 1505 09/22/23 0521 09/23/23 0516 09/24/23 0709  NA 134*   < > 138 140 138 140 139  K 4.0   < > 3.7 3.9 4.0 3.8 4.0  CL 107   < > 109 107 107 103 103  CO2 20*   < > 24 24 24 26 27   GLUCOSE 100*   < > 116* 149* 95 127* 107*  BUN 7   < > 12 9 7 11 12   CREATININE 1.00   < > 1.37* 1.10* 1.02* 1.09* 0.94  CALCIUM 8.2*   < > 7.7* 8.2* 8.3* 8.7* 8.9  MG 4.4*  --   --   --   --   --   --    < > = values in this interval not displayed.   GFR: Estimated Creatinine Clearance: 151.9 mL/min (by C-G formula based on SCr of 0.94 mg/dL). Liver Function Tests: Recent Labs  Lab 09/21/23 0424 09/21/23 1505 09/22/23 0521 09/23/23 0516 09/24/23 0709  AST 22 24 26 22 18   ALT 11 12 14 14 14   ALKPHOS 64 66 61 61 63  BILITOT 0.5 0.6 0.6 0.7 0.5  PROT 4.9* 5.4* 5.3* 5.6* 6.1*  ALBUMIN 1.8* 2.1* 1.9* 2.1* 2.3*   No results for input(s): LIPASE, AMYLASE in the last 168 hours. No results for input(s): AMMONIA in the last 168 hours. Coagulation Profile: Recent Labs  Lab 09/19/23 1632  INR 1.1   Cardiac Enzymes: No results for input(s): CKTOTAL, CKMB, CKMBINDEX, TROPONINI in the last 168 hours. BNP (last 3 results) No results for input(s): PROBNP in the last 8760 hours. HbA1C: No  results for input(s): HGBA1C in the last 72 hours.  CBG: Recent Labs  Lab 09/23/23 1154 09/23/23 2158 09/24/23 0034 09/24/23 0527 09/24/23 0923  GLUCAP 106* 111* 190* 125* 109*   Lipid Profile: No results for input(s): CHOL, HDL, LDLCALC, TRIG, CHOLHDL, LDLDIRECT in the last 72 hours. Thyroid  Function Tests: No results for input(s): TSH, T4TOTAL, FREET4, T3FREE, THYROIDAB in the last 72 hours. Anemia Panel: No results for input(s): VITAMINB12, FOLATE, FERRITIN, TIBC, IRON , RETICCTPCT in the last 72 hours. Sepsis Labs: Recent Labs  Lab 09/19/23 1632  PROCALCITON 0.24    Recent Results (from the past 240 hours)  Culture, Respiratory w Gram Stain     Status: None   Collection Time: 09/19/23 11:55 AM   Specimen: Tracheal Aspirate; Respiratory  Result Value Ref Range Status   Specimen Description TRACHEAL ASPIRATE  Final   Special Requests NONE  Final   Gram Stain   Final    MODERATE WBC PRESENT, PREDOMINANTLY PMN NO EPITHELIAL CELLS SEEN  NO ORGANISMS SEEN    Culture   Final    Normal respiratory flora-no Staph aureus or Pseudomonas seen Performed at [facility], [address] [place], [place] [address]    Report Status 09/21/2023 FINAL  Final  MRSA Next Gen by PCR, Nasal     Status: None   Collection Time: 09/19/23  7:30 PM   Specimen: Nasal Mucosa; Nasal Swab  Result Value Ref Range Status   MRSA by PCR Next Gen NOT DETECTED NOT DETECTED Final    Comment: (NOTE) The GeneXpert MRSA Assay (FDA approved for NASAL specimens only), is one component of a comprehensive MRSA colonization surveillance program. It is not intended to diagnose MRSA infection nor to guide or monitor treatment for MRSA infections. Test performance is not FDA approved  in patients less than 73 years old. Performed at [facility], [address] [place], [place] [address]   Expectorated Sputum Assessment w Gram Stain, Rflx to Resp Cult      Status: None   Collection Time: 09/22/23  5:13 PM   Specimen: Sputum  Result Value Ref Range Status   Specimen Description SPUTUM  Final   Special Requests NONE  Final   Sputum evaluation   Final    THIS SPECIMEN IS ACCEPTABLE FOR SPUTUM CULTURE Performed at [facility], [address], [place], [place] [address]    Report Status 09/23/2023 FINAL  Final  Culture, Respiratory w Gram Stain     Status: None (Preliminary result)   Collection Time: 09/22/23  5:13 PM   Specimen: SPU  Result Value Ref Range Status   Specimen Description SPUTUM  Final   Special Requests NONE Reflexed from [id number]  Final   Gram Stain   Final    ABUNDANT WBC PRESENT, PREDOMINANTLY PMN NO ORGANISMS SEEN Performed at [facility], [address], [place], [place] [address]    Culture PENDING  Incomplete   Report Status PENDING  Incomplete         Radiology Studies: VAS US  LOWER EXTREMITY VENOUS (DVT) Result Date: 09/23/2023  Lower Venous DVT Study Patient Name:  Laura Benson  Date of Exam:   09/23/2023 Medical Rec #: [id number]         Accession #:    [id number] Date of Birth: [birth year]        Patient Gender: F Patient Age:   12 years Exam Location:  [facility] Procedure:      VAS US  LOWER EXTREMITY VENOUS (DVT) Referring Phys: A POWELL JR --------------------------------------------------------------------------------  Indications: Swelling.  Limitations: Body habitus. Performing Technologist: Elmarie Lindau, RVT  Examination Guidelines: A complete evaluation includes B-mode imaging, spectral Doppler, color Doppler, and power Doppler as needed of all accessible portions of each vessel. Bilateral testing is considered an integral part of a complete examination. Limited examinations for reoccurring indications may be performed as noted. The reflux portion of the exam is performed with the patient in reverse Trendelenburg.   +---------+---------------+---------+-----------+----------+-------------------+ RIGHT    CompressibilityPhasicitySpontaneityPropertiesThrombus Aging      +---------+---------------+---------+-----------+----------+-------------------+ CFV                                                   wound vac           +---------+---------------+---------+-----------+----------+-------------------+ FV Prox  Full                                                             +---------+---------------+---------+-----------+----------+-------------------+ FV Mid                  Yes                                               +---------+---------------+---------+-----------+----------+-------------------+ FV Distal  Not well visualized +---------+---------------+---------+-----------+----------+-------------------+ PFV      Full                                                             +---------+---------------+---------+-----------+----------+-------------------+ POP      Full           Yes      Yes                                      +---------+---------------+---------+-----------+----------+-------------------+   +---------+---------------+---------+-----------+----------+-------------------+ LEFT     CompressibilityPhasicitySpontaneityPropertiesThrombus Aging      +---------+---------------+---------+-----------+----------+-------------------+ CFV      Full           Yes      Yes                                      +---------+---------------+---------+-----------+----------+-------------------+ SFJ      Full                                                             +---------+---------------+---------+-----------+----------+-------------------+ FV Prox  Full                                                             +---------+---------------+---------+-----------+----------+-------------------+ FV  Mid                                                urinary catheter                                                          attached            +---------+---------------+---------+-----------+----------+-------------------+ FV Distal                                             Not well visualized +---------+---------------+---------+-----------+----------+-------------------+ PFV      Full                                                             +---------+---------------+---------+-----------+----------+-------------------+ POP      Full  Yes      Yes                                      +---------+---------------+---------+-----------+----------+-------------------+     Summary: RIGHT: - There is no evidence of deep vein thrombosis in the lower extremity. However, portions of this examination were limited- see technologist comments above.  - No cystic structure found in the popliteal fossa.  LEFT: - There is no evidence of deep vein thrombosis in the lower extremity. However, portions of this examination were limited- see technologist comments above.  - No cystic structure found in the popliteal fossa.  *See table(s) above for measurements and observations. Electronically signed by Lonni Gaskins MD on 09/23/2023 at 6:05:45 PM.    Final    CT Angio Chest Pulmonary Embolism (PE) W or WO Contrast Result Date: 09/22/2023 CLINICAL DATA:  Shortness of breath, tachycardia, hemoptysis. EXAM: CT ANGIOGRAPHY CHEST WITH CONTRAST TECHNIQUE: Multidetector CT imaging of the chest was performed using the standard protocol during bolus administration of intravenous contrast. Multiplanar CT image reconstructions and MIPs were obtained to evaluate the vascular anatomy. RADIATION DOSE REDUCTION: This exam was performed according to the departmental dose-optimization program which includes automated exposure control, adjustment of the mA and/or kV according to patient size and/or  use of iterative reconstruction technique. CONTRAST:  50mL OMNIPAQUE  IOHEXOL  350 MG/ML SOLN COMPARISON:  March 26, 2005. FINDINGS: Cardiovascular: There is no evidence of large central pulmonary embolus seen in the main pulmonary artery or main portions of the proximal left and right pulmonary arteries. However, evaluation of smaller and more peripheral branches is limited due to body habitus, and therefore smaller and more peripheral pulmonary emboli cannot be excluded on the basis of this exam. Normal cardiac size. No pericardial effusion. Mediastinum/Nodes: No enlarged mediastinal, hilar, or axillary lymph nodes. Thyroid  gland, trachea, and esophagus demonstrate no significant findings. Lungs/Pleura: No pneumothorax or pleural effusion is noted. Large airspace opacities are seen in right upper and middle lobes consistent with pneumonia. Minimal left posterior basilar subsegmental atelectasis is noted. Upper Abdomen: Cholelithiasis. Musculoskeletal: No chest wall abnormality. No acute or significant osseous findings. Review of the MIP images confirms the above findings. IMPRESSION: 1. There is no definite evidence of large central pulmonary embolus seen in the main pulmonary artery or main portions of the proximal left and right pulmonary arteries. However, evaluation of smaller and more peripheral branches is limited due to body habitus, and therefore smaller and more peripheral pulmonary emboli cannot be excluded on the basis of this exam. 2. Large airspace opacities are seen in right upper and middle lobes consistent with pneumonia. 3. Cholelithiasis. Electronically Signed   By: Lynwood Landy Raddle M.D.   On: 09/22/2023 12:20        LOS: 7 days   Time spent= 39 mins    Deliliah Room, MD [facility]  If 7PM-7AM, please contact night-coverage  09/24/2023, 11:16 AM

## 2023-09-24 NOTE — Progress Notes (Signed)
 Called to the room for tachypnea and tachycardia.  Currently have LUE dopplers due to DVT concern  Patient talking in complete sentences.  Lung CTAB CV: RRR  Discussed plan to get BNP  Follow up dopplers  Suzen Maryan Masters, MD, MPH, ABFM, [facility] Attending Physician Center for [facility]

## 2023-09-25 ENCOUNTER — Inpatient Hospital Stay (HOSPITAL_COMMUNITY)

## 2023-09-25 ENCOUNTER — Other Ambulatory Visit (HOSPITAL_COMMUNITY): Payer: Self-pay

## 2023-09-25 ENCOUNTER — Encounter: Admitting: Family Medicine

## 2023-09-25 DIAGNOSIS — Z98891 History of uterine scar from previous surgery: Secondary | ICD-10-CM

## 2023-09-25 DIAGNOSIS — J189 Pneumonia, unspecified organism: Secondary | ICD-10-CM | POA: Diagnosis not present

## 2023-09-25 LAB — CULTURE, RESPIRATORY W GRAM STAIN: Culture: NORMAL

## 2023-09-25 LAB — COMPREHENSIVE METABOLIC PANEL WITH GFR
ALT: 15 U/L (ref 0–44)
AST: 24 U/L (ref 15–41)
Albumin: 2.2 g/dL — ABNORMAL LOW (ref 3.5–5.0)
Alkaline Phosphatase: 59 U/L (ref 38–126)
Anion gap: 10 (ref 5–15)
BUN: 14 mg/dL (ref 6–20)
CO2: 25 mmol/L (ref 22–32)
Calcium: 8.5 mg/dL — ABNORMAL LOW (ref 8.9–10.3)
Chloride: 103 mmol/L (ref 98–111)
Creatinine, Ser: 1.1 mg/dL — ABNORMAL HIGH (ref 0.44–1.00)
GFR, Estimated: >60 mL/min (ref >60)
Glucose, Bld: 127 mg/dL — ABNORMAL HIGH (ref 70–99)
Potassium: 4.1 mmol/L (ref 3.5–5.1)
Sodium: 138 mmol/L (ref 135–145)
Total Bilirubin: 0.5 mg/dL (ref 0.0–1.2)
Total Protein: 5.8 g/dL — ABNORMAL LOW (ref 6.5–8.1)

## 2023-09-25 LAB — CBC
HCT: 25.3 % — ABNORMAL LOW (ref 36.0–46.0)
Hemoglobin: 8.2 g/dL — ABNORMAL LOW (ref 12.0–15.0)
MCH: 25.1 pg — ABNORMAL LOW (ref 26.0–34.0)
MCHC: 32.4 g/dL (ref 30.0–36.0)
MCV: 77.4 fL — ABNORMAL LOW (ref 80.0–100.0)
Platelets: 284 K/uL (ref 150–400)
RBC: 3.27 MIL/uL — ABNORMAL LOW (ref 3.87–5.11)
RDW: 14.5 % (ref 11.5–15.5)
WBC: 7.8 K/uL (ref 4.0–10.5)
nRBC: 0 % (ref 0.0–0.2)

## 2023-09-25 LAB — GLUCOSE, CAPILLARY: Glucose-Capillary: 128 mg/dL — ABNORMAL HIGH (ref 70–99)

## 2023-09-25 MED ORDER — HYDRALAZINE HCL 50 MG PO TABS
50.0000 mg | ORAL_TABLET | Freq: Three times a day (TID) | ORAL | 0 refills | Status: DC
  Filled 2023-09-25: qty 90, 30d supply, fill #0

## 2023-09-25 MED ORDER — OXYCODONE HCL 5 MG PO TABS
5.0000 mg | ORAL_TABLET | ORAL | 0 refills | Status: DC | PRN
  Filled 2023-09-25: qty 12, 2d supply, fill #0

## 2023-09-25 MED ORDER — AMOXICILLIN-POT CLAVULANATE 875-125 MG PO TABS
1.0000 | ORAL_TABLET | Freq: Two times a day (BID) | ORAL | 0 refills | Status: DC
Start: 2023-09-25 — End: 2023-12-01
  Filled 2023-09-25: qty 9, 5d supply, fill #0

## 2023-09-25 MED ORDER — POTASSIUM CHLORIDE CRYS ER 20 MEQ PO TBCR
40.0000 meq | EXTENDED_RELEASE_TABLET | Freq: Every day | ORAL | 0 refills | Status: DC
  Filled 2023-09-25: qty 6, 3d supply, fill #0

## 2023-09-25 MED ORDER — HYDRALAZINE HCL 50 MG PO TABS: 50.0000 mg | ORAL_TABLET | Freq: Three times a day (TID) | ORAL | Status: DC

## 2023-09-25 MED ORDER — ENALAPRIL MALEATE 10 MG PO TABS
5.0000 mg | ORAL_TABLET | Freq: Every day | ORAL | 0 refills | Status: DC
  Filled 2023-09-25: qty 30, 60d supply, fill #0

## 2023-09-25 MED ORDER — DIPHENHYDRAMINE HCL 25 MG PO CAPS
25.0000 mg | ORAL_CAPSULE | Freq: Once | ORAL | Status: DC
  Filled 2023-09-25: qty 1

## 2023-09-25 MED ORDER — ENOXAPARIN SODIUM 100 MG/ML IJ SOSY
200.0000 mg | PREFILLED_SYRINGE | Freq: Two times a day (BID) | INTRAMUSCULAR | 0 refills | Status: DC
  Filled 2023-09-25: qty 40, 10d supply, fill #0

## 2023-09-25 MED ORDER — SENNOSIDES-DOCUSATE SODIUM 8.6-50 MG PO TABS
2.0000 | ORAL_TABLET | Freq: Every day | ORAL | 0 refills | Status: DC
  Filled 2023-09-25: qty 30, 15d supply, fill #0

## 2023-09-25 MED ORDER — BUPROPION HCL ER (XL) 150 MG PO TB24
150.0000 mg | ORAL_TABLET | Freq: Every day | ORAL | 0 refills | Status: DC
  Filled 2023-09-25: qty 30, 30d supply, fill #0

## 2023-09-25 MED ORDER — ACETAMINOPHEN 500 MG PO TABS
1000.0000 mg | ORAL_TABLET | Freq: Four times a day (QID) | ORAL | 0 refills | Status: DC
  Filled 2023-09-25: qty 30, 4d supply, fill #0

## 2023-09-25 MED ORDER — FUROSEMIDE 40 MG PO TABS
40.0000 mg | ORAL_TABLET | Freq: Every day | ORAL | 0 refills | Status: DC
  Filled 2023-09-25: qty 5, 5d supply, fill #0

## 2023-09-25 NOTE — Care Management (Signed)
 CM in contact with Laura Benson with [facility]. Currently seeking auth/approval for NIV. Patient did walk test without O2 and did not qualify for home oxygen.  Julian WENDI Amber RNC-MNN, BSN [facility]

## 2023-09-25 NOTE — Care Management (Signed)
 Received a call from Nat RAMAN. CSW that patient is needing DME supplies.  CM in touch with providers and Zach with [facility] with referral.  CM spoke to patient and demographics correct and she shared with CM that she cannot remember who she had used DME equipment from in the past and did not have preference.

## 2023-09-25 NOTE — TOC Initial Note (Addendum)
 Transition of Care [employer]) - Initial/Assessment Note    Patient Details  Name: Laura Benson MRN: [id number] Date of Birth: 05/22/1990  [facility] CM/SW Contact:    Charlene Julian Daring, RN Phone Number:[phone] 09/25/2023, 4:24 PM  Clinical Narrative:                  33 y.o. female with medical history significant of T2DM on insulin  pump, Class III obesity, OSA, HTN, depression/anxiety who was admitted for induction of labor.   CM had referral for respiratory equipment and rolling walker and tub bench. CM reviewed demographics and they are correct. Patient shared she works for [employer] and lives with her Dad and drives. She also knows how to use medicaid transportation she shared with CM. She informed CM that she has had a CPAP in past over 3 years ago but did not remember the company she used and did not have preference of DME companies. CM called Zach with [facility] and he came to room round 1600 today and delivered NIV -non invasive ventilation to patient's room with teaching. CM called [employer] [facility] with referral for tub bench and also rolling walker. [facility]ermaine share shared that insurance will ot cover tub bench but will cover rolling walker ( bariatric ) size. He will deliver to patient's room prior to discharge. CM shared and spoke with patient about how to order tub bench on [employer] and she verbalized understanding.   Patient requested [place] for PCP appointment  and CM secured an appt with PCP and placed in follow up section. 1st available was taken. Patient will follow up with OB provider as well within 1 week.    Expected Discharge Plan and Services Dc home with rolling walker ( bariatric)- [facility] # [id number] Respiratory DME- Volume Targeted NIV (Zach with [facility] # [phone].   Prior Living Arrangements/Services Lives with her father   Activities of Daily Living   ADL Screening (condition at time of  admission) Independently performs ADLs?: Yes (appropriate for developmental age) Is the patient deaf or have difficulty hearing?: No Does the patient have difficulty seeing, even when wearing glasses/contacts?: No Does the patient have difficulty concentrating, remembering, or making decisions?: No    Admission diagnosis:  Type 2 diabetes mellitus during pregnancy [O24.119] Patient Active Problem List   Diagnosis Date Noted   Superficial venous thrombosis of arm, left 09/24/2023   Postoperative anemia due to acute blood loss 09/22/2023   Pneumonia 09/22/2023   Respiratory distress 09/19/2023   Type 2 diabetes mellitus during pregnancy 09/17/2023   Gestational diabetes mellitus (GDM) requiring insulin  06/08/2023   Poor fetal growth affecting management of mother in second trimester 05/25/2023   Alpha thalassemia silent carrier 04/08/2023   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   Supervision of high risk pregnancy, antepartum 03/17/2023   Morbid obesity (HCC) 03/17/2023   MDD (major depressive disorder), recurrent, severe, with psychosis (HCC) 08/31/2021   GAD (generalized anxiety disorder) 10/23/2020   Complication of anesthesia 09/04/2020   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   Lumbar radiculopathy 08/17/2019   Uncontrolled type 2 diabetes mellitus with hyperglycemia (HCC) 01/18/2019   Asthma 06/08/2018   Umbilical hernia without obstruction and without gangrene 02/25/2017   Obstructive sleep apnea 12/11/2016   Chronic hypertension in pregnancy 01/23/2016   Depression, recurrent (HCC) 01/05/2009   Severe obesity (BMI >= 40) (HCC) 03/26/2006   PCP:  [facility] Pharmacy:   [facility] [phone] - [place], [place] - [address] AT [facility] [address] [facility] [address] [place] [address] Phone: [phone] Fax: [phone]  [facility] [address] [place] [address] Phone: [phone]  Fax: [phone]     Social Drivers of Health (SDOH) Social History: SDOH Screenings   Food Insecurity: No Food Insecurity (04/27/2023)  Recent Concern: Food Insecurity - Food Insecurity Present (04/21/2023)  Housing: Low Risk  (04/27/2023)  Recent Concern: Housing - High Risk (04/21/2023)  Transportation Needs: No Transportation Needs (04/27/2023)  Utilities: Not At Risk (04/27/2023)  Alcohol Screen: Low Risk  (08/31/2021)  Depression (PHQ2-9): Low Risk  (07/02/2023)  Financial Resource Strain: Low Risk  (04/21/2023)  Physical Activity: Sufficiently Active (04/21/2023)  Social Connections: Moderately Isolated (04/21/2023)  Stress: No Stress Concern Present (04/21/2023)  Tobacco Use: Medium Risk (09/19/2023)  Risk Interventions     No data to display

## 2023-09-25 NOTE — Social Work (Addendum)
 CSW met MOB at bedside to check in and provide housing resources. MOB appeared and was bonding with her infant as evidenced by her holding, kissing and talking to him. FOB was present. CSW asked MOB how she had been doing. MOB expressed that she was doing much better and her walking ability had improved.She mentioned that she would need home oxygen and a CPAP at discharge. CSW informed MOB that RNCM would follow up with her regarding the medical equipment. MOB verbalized understanding. CSW provided MOB with housing resources and assessed for additional needs. MOB reported none.   MOB's provider informed CSW about MOB's need for home oxygen and CPAP. CSW notified RNCM about MOB's needs.   Nat Quiet, MSW, LCSW Clinical Social Worker  [phone] 09/25/2023  10:15AM

## 2023-09-25 NOTE — Progress Notes (Addendum)
 PROGRESS NOTE    Laura Benson  FMW:[id number] DOB: 1990-05-16 DOA: 09/17/2023 PCP: [facility]   Brief Narrative:    33 y.o. female with medical history significant of T2DM on insulin  pump, Class III obesity, OSA, HTN, depression/anxiety who was admitted for induction of labor.     Her course was complicated by acute hypoxic respiratory failure due to acute pulmonary edema in the setting of uncontrolled hypertension requiring bipap.  She was intubated for c section and remained intubated post op with critical care following.  She was extubated 8/24.  Since her extubation, they've had issues with weaning her oxygen.  Today she had some small volume hemoptysis and OB obtained a CT PE protocol which showed evidence of pneumonia.  The hospitalists have been consulted for assistance with management of pneumonia.    Significant Events 8/21 presented for IOL. Insulin  gtt 8/22 epidural  8/23 new O2 req, progressive SOB. Diuresing. PCCM consult  8/23 primary low transverse C section with vacuum assistance with kiwi vacuum and IUD insertion - she was intubated for C section and remained intubated post op 8/24 extubated 8/26 small volume hemoptysis, CT PE protocol with pneumonia - hospitalists consulted, started on IV Unasyn  8/28: Acute left cephalic vein thrombosis, she is on therapeutic dose of lovenox  now.  Needs Home oxygen and NIV arrangements.  Assessment & Plan:  Principal Problem:   Type 2 diabetes mellitus during pregnancy Active Problems:   Severe obesity (BMI >= 40) (HCC)   Chronic hypertension in pregnancy   GAD (generalized anxiety disorder)   Anti M Antibody positive   Respiratory distress   Postoperative anemia due to acute blood loss   Pneumonia   Superficial venous thrombosis of arm, left    Right upper and middle lobe possible bacterial community acquired Pneumonia : CT scan chest with right upper and middle lobe opacities c/w pneumonia (CXR 8/23 with  opacity of R lung fields due to multilobar pneumonia) Will treat with unasyn .  Plan for 7 day course of abx.   Negative MRSA PCR.  Respiratory culture from 8/23 with normal resp flora.   Follow sputum cx- No organisms seen I reviewed the chest xray which was done today and It showed slight improvement in right sided consolidations.   Acute Hypoxic and Hypercapneic Respiratory Failure  Hypoxia likely due to pneumonia above.  DOE related to pneumonia, but suspect also related to anemia. Extubated 8/24 with acute hypoxic respiratory failure in the setting of pulmonary edema Echo with preserved EF, no RWMA, mild LVH, normal RVSF Normal BNP (can be falsely low in the setting of class III Obesity ), continue oral lasix  for now  Pulmonary critical care signed off on 8/24 for resp failure with hypoxia/hypercapnia, pulmonary edema, OSA, obesity - they recommended continuing nightly bipap  She needs 2 L/min oxygen on exertion only. Home oxygen will need to be arranged prior to discharge. She likely has restrictive lung disease in the setting of Class III obesity as well as possible COPD (h/o tobacco abuse). She will need PFTs done as an outpatient.   Acute left cephalic vein thrombosis: Diagnosed on 8/28. On therapeutic dose of lovenox  as she is at very high considering postpartum status, Class III Obesity and limited mobility.   OSA with likely OHS: Needs volume targeted NIV arrangement at home. TOC is working on it. -She needs out patient pulmonology follow up on discharge -Needs out patient Polysomnography  -Patient has severe OHS as evidenced by day time blood gas showing  hypercapnic respiratory failure with respiratory acidosis. -Volume Targeted NIV will treat patient's symptoms effectively and normalize blood gas abnormalities. This will also prevent further worsening of patient's respiratory status and recurrent hospitalizations. Needs Internal battery component for continued respiratory  support and Alarms to recognize acute changes in pts pulmonary compliance.    Hemoptysis, resolved Small volume, likely explained by the pneumonia above CT is without large central pulm embolus - evaluation of small/peripheral branches limited due to body habitus  No evidence of DVT on LE ultrasound   Symptomatic Anemia S/p IV iron  and PRBC    Sinus Tachycardia Due to above    AKI Improved  G1P1001 s/p primary low transverse C section at [redacted]w[redacted]d for failed IOL for chronic HTN and T2DM  Severe preeclampsia with need for intubation (pulmonary edema, AKI) S/p c section 8/23 Post partum course per OB, primary Prevena in place   T2DM On insulin  pump at home Continue SSI as ordered   Hypertension Enalapril , hydralazine , amlodipine   Major moderate Depression  Anxiety Wellbutrin   Class III Obesity: h/o sleeve gastrectomy    DVT prophylaxis: On therapeutic dose of Lovenox   Case discussed in length with Dr Erik at the bedside today.  Code status: Full Code  Family Communication:  Family at bedside Status is: Inpatient Remains inpatient appropriate because: PNA, ARF    Subjective:  She does short of breath and tachycardic with minimal exertion. She knows that she has a left arm superficial thrombosis and she will need to go home on therapeutic dose of lovenox . She had CPAP in the past but doesn't have one now. We talked about arranging home oxygen and NIV machine. Case manager is working on it. She did tell me that she quit Marijuana smoking last December. Prior to that, she did smoke tobacco for a while.  Examination:  General exam: Appears calm and comfortable, nasal oxygen in place at 1 L/min Respiratory system: Clear to auscultation. Respiratory effort normal. Cardiovascular system: S1 & S2 heard, RRR. No JVD, murmurs, rubs, gallops or clicks. No pedal edema. Gastrointestinal system: Abdomen is nondistended, soft and nontender. No organomegaly or masses felt.  Normal bowel sounds heard. Central nervous system: Alert and oriented. No focal neurological deficits. Extremities: Symmetric 5 x 5 power. Skin: No rashes, lesions or ulcers Psychiatry: Judgement and insight appear normal. Mood & affect appropriate.       Diet Orders (From admission, onward)     Start     Ordered   09/21/23 0832  Diet Carb Modified Fluid consistency: Thin; Room service appropriate? Yes  Diet effective now       Question Answer Comment  Diet-HS Snack? Nothing   Calorie Level Medium 1600-2000   Fluid consistency: Thin   Room service appropriate? Yes      09/21/23 0832            Objective: Vitals:   09/25/23 0457 09/25/23 0817 09/25/23 0840 09/25/23 1003  BP:  (!) 154/86  (!) 140/78  Pulse:  98    Resp:  18 (!) 30   Temp:  98.8 F (37.1 C)    TempSrc:  Oral    SpO2: 98% 97%  99%  Weight:      Height:       No intake or output data in the 24 hours ending 09/25/23 1109  Filed Weights   09/17/23 1117  Weight: (!) 193.6 kg    Scheduled Meds:  sodium chloride    Intravenous Once   acetaminophen   1,000 mg  Oral Q6H   amLODipine   10 mg Oral Daily   buPROPion   150 mg Oral Daily   enalapril   5 mg Oral Daily   enoxaparin  (LOVENOX ) injection  200 mg Subcutaneous Q12H   feeding supplement  237 mL Oral BID BM   furosemide   40 mg Oral Daily   gabapentin   100 mg Oral TID   hydrALAZINE   100 mg Oral Q12H   insulin  aspart  0-5 Units Subcutaneous QHS   insulin  aspart  0-9 Units Subcutaneous TID WC   pantoprazole   40 mg Oral Daily   polyethylene glycol  17 g Oral QODAY   potassium chloride   40 mEq Oral Daily   prenatal multivitamin  1 tablet Oral Q1200   senna-docusate  2 tablet Oral Daily   simethicone   80 mg Oral TID PC   sodium chloride  flush  3 mL Intravenous Q12H   Continuous Infusions:  ampicillin -sulbactam (UNASYN ) IV 3 g (09/25/23 0848)    Nutritional status     Body mass index is 68.89 kg/m.  Data Reviewed:   CBC: Recent Labs  Lab  09/20/23 0853 09/21/23 0424 09/22/23 0521 09/23/23 0516 09/24/23 0709 09/25/23 0529  WBC 14.7* 10.2 8.2 7.3 7.4 7.8  NEUTROABS 11.7*  --   --   --   --   --   HGB 9.1* 7.2* 7.4* 8.2* 8.5* 8.2*  HCT 28.7* 22.3* 23.5* 25.0* 26.5* 25.3*  MCV 77.2* 76.4* 76.8* 76.7* 77.7* 77.4*  PLT 200 183 209 242 270 284   Basic Metabolic Panel: Recent Labs  Lab 09/19/23 0411 09/19/23 0805 09/21/23 1505 09/22/23 0521 09/23/23 0516 09/24/23 0709 09/25/23 0529  NA 134*   < > 140 138 140 139 138  K 4.0   < > 3.9 4.0 3.8 4.0 4.1  CL 107   < > 107 107 103 103 103  CO2 20*   < > 24 24 26 27 25   GLUCOSE 100*   < > 149* 95 127* 107* 127*  BUN 7   < > 9 7 11 12 14   CREATININE 1.00   < > 1.10* 1.02* 1.09* 0.94 1.10*  CALCIUM 8.2*   < > 8.2* 8.3* 8.7* 8.9 8.5*  MG 4.4*  --   --   --   --   --   --    < > = values in this interval not displayed.   GFR: Estimated Creatinine Clearance: 129.8 mL/min (A) (by C-G formula based on SCr of 1.1 mg/dL (H)). Liver Function Tests: Recent Labs  Lab 09/21/23 1505 09/22/23 0521 09/23/23 0516 09/24/23 0709 09/25/23 0529  AST 24 26 22 18 24   ALT 12 14 14 14 15   ALKPHOS 66 61 61 63 59  BILITOT 0.6 0.6 0.7 0.5 0.5  PROT 5.4* 5.3* 5.6* 6.1* 5.8*  ALBUMIN 2.1* 1.9* 2.1* 2.3* 2.2*   No results for input(s): LIPASE, AMYLASE in the last 168 hours. No results for input(s): AMMONIA in the last 168 hours. Coagulation Profile: Recent Labs  Lab 09/19/23 1632  INR 1.1   Cardiac Enzymes: No results for input(s): CKTOTAL, CKMB, CKMBINDEX, TROPONINI in the last 168 hours. BNP (last 3 results) No results for input(s): PROBNP in the last 8760 hours. HbA1C: No results for input(s): HGBA1C in the last 72 hours.  CBG: Recent Labs  Lab 09/24/23 0527 09/24/23 0923 09/24/23 1500 09/24/23 2102 09/24/23 2305  GLUCAP 125* 109* 97 120* 96   Lipid Profile: No results for input(s): CHOL, HDL, LDLCALC, TRIG, CHOLHDL,  LDLDIRECT in the  last 72 hours. Thyroid  Function Tests: No results for input(s): TSH, T4TOTAL, FREET4, T3FREE, THYROIDAB in the last 72 hours. Anemia Panel: No results for input(s): VITAMINB12, FOLATE, FERRITIN, TIBC, IRON , RETICCTPCT in the last 72 hours. Sepsis Labs: Recent Labs  Lab 09/19/23 1632  PROCALCITON 0.24    Recent Results (from the past 240 hours)  Culture, Respiratory w Gram Stain     Status: None   Collection Time: 09/19/23 11:55 AM   Specimen: Tracheal Aspirate; Respiratory  Result Value Ref Range Status   Specimen Description TRACHEAL ASPIRATE  Final   Special Requests NONE  Final   Gram Stain   Final    MODERATE WBC PRESENT, PREDOMINANTLY PMN NO EPITHELIAL CELLS SEEN  NO ORGANISMS SEEN    Culture   Final    Normal respiratory flora-no Staph aureus or Pseudomonas seen Performed at [facility], [address] [place], [place] [address]    Report Status 09/21/2023 FINAL  Final  MRSA Next Gen by PCR, Nasal     Status: None   Collection Time: 09/19/23  7:30 PM   Specimen: Nasal Mucosa; Nasal Swab  Result Value Ref Range Status   MRSA by PCR Next Gen NOT DETECTED NOT DETECTED Final    Comment: (NOTE) The GeneXpert MRSA Assay (FDA approved for NASAL specimens only), is one component of a comprehensive MRSA colonization surveillance program. It is not intended to diagnose MRSA infection nor to guide or monitor treatment for MRSA infections. Test performance is not FDA approved in patients less than 14 years old. Performed at [facility], [address] [place], [place] [address]   Expectorated Sputum Assessment w Gram Stain, Rflx to Resp Cult     Status: None   Collection Time: 09/22/23  5:13 PM   Specimen: Sputum  Result Value Ref Range Status   Specimen Description SPUTUM  Final   Special Requests NONE  Final   Sputum evaluation   Final    THIS SPECIMEN IS ACCEPTABLE FOR SPUTUM CULTURE Performed at [facility], [address], [place], [place] [address]    Report Status 09/23/2023 FINAL  Final  Culture, Respiratory w Gram Stain     Status: None (Preliminary result)   Collection Time: 09/22/23  5:13 PM   Specimen: SPU  Result Value Ref Range Status   Specimen Description SPUTUM  Final   Special Requests NONE Reflexed from [id number]  Final   Gram Stain   Final    ABUNDANT WBC PRESENT, PREDOMINANTLY PMN NO ORGANISMS SEEN    Culture   Final    NO GROWTH < 24 HOURS Performed at [facility], [address] [place], [place] [address]    Report Status PENDING  Incomplete         Radiology Studies: DG CHEST PORT 1 VIEW Result Date: 09/25/2023 CLINICAL DATA:  Dyspnea. EXAM: PORTABLE CHEST 1 VIEW COMPARISON:  09/19/2023. FINDINGS: Stable cardiomediastinal contours. Similar right upper and middle lobe opacities, concerning for pneumonia, better visualized on the recent prior CTA chest dated 09/22/2023. Left lung appears clear. No pleural effusion or pneumothorax. No acute osseous abnormality. IMPRESSION: Similar right upper and middle lobe airspace opacities, concerning for pneumonia. Electronically Signed   By: Harrietta Sherry M.D.   On: 09/25/2023 10:51   VAS US  UPPER EXTREMITY VENOUS DUPLEX Result Date: 09/24/2023 UPPER VENOUS STUDY  Patient Name:  MAHROSH Brawley  Date of Exam:   09/24/2023 Medical Rec #:  [id number]         Accession #:    [id number] Date of Birth: Sep 27, 1990        Patient Gender: F Patient Age:   76 years Exam Location:  [facility] Procedure:      VAS US  UPPER EXTREMITY VENOUS DUPLEX Referring Phys: KIETH FORSYTH --------------------------------------------------------------------------------  Indications: Pain Other Indications: Recent LUE IV placement. Risk Factors: Recently pregnant. Limitations: Body habitus (BMI 68.7), poor ultrasound/tissue interface and limited mobility of LUE. Comparison Study: No previous exams Performing Technologist: Jody Hill RVT, RDMS  Examination  Guidelines: A complete evaluation includes B-mode imaging, spectral Doppler, color Doppler, and power Doppler as needed of all accessible portions of each vessel. Bilateral testing is considered an integral part of a complete examination. Limited examinations for reoccurring indications may be performed as noted.  Right Findings: +----------+------------+---------+-----------+----------+--------------------+ RIGHT     CompressiblePhasicitySpontaneousProperties      Summary        +----------+------------+---------+-----------+----------+--------------------+ Subclavian               Yes       Yes                   patent by                                                              color/doppler     +----------+------------+---------+-----------+----------+--------------------+  Left Findings: +----------+------------+---------+-----------+----------+--------------------+ LEFT      CompressiblePhasicitySpontaneousProperties      Summary        +----------+------------+---------+-----------+----------+--------------------+ IJV           Full       Yes       Yes                                   +----------+------------+---------+-----------+----------+--------------------+ Subclavian    Full       Yes       Yes                                   +----------+------------+---------+-----------+----------+--------------------+ Axillary      Full       Yes       Yes                                   +----------+------------+---------+-----------+----------+--------------------+ Brachial      Full                                                       +----------+------------+---------+-----------+----------+--------------------+ Radial        Full                                                       +----------+------------+---------+-----------+----------+--------------------+ Ulnar  Full                                                        +----------+------------+---------+-----------+----------+--------------------+ Cephalic      None       No        No                      Acute         +----------+------------+---------+-----------+----------+--------------------+ Basilic                  Yes       Yes                   patent by                                                              color/doppler     +----------+------------+---------+-----------+----------+--------------------+ limited visualization of basilic and brachial veins. Visualized areas appear patent.  Summary:  Right: No evidence of thrombosis in the subclavian.  Left: No evidence of deep vein thrombosis in the upper extremity. Findings consistent with acute superficial vein thrombosis involving the left cephalic vein.  *See table(s) above for measurements and observations.  Diagnosing physician: Gaile New MD Electronically signed by Gaile New MD on 09/24/2023 at 9:56:18 PM.    Final    VAS US  LOWER EXTREMITY VENOUS (DVT) Result Date: 09/23/2023  Lower Venous DVT Study Patient Name:  VIKKIE Jerkins  Date of Exam:   09/23/2023 Medical Rec #: [id number]         Accession #:    [id number] Date of Birth: 11-30-1990        Patient Gender: F Patient Age:   89 years Exam Location:  [facility] Procedure:      VAS US  LOWER EXTREMITY VENOUS (DVT) Referring Phys: A POWELL JR --------------------------------------------------------------------------------  Indications: Swelling.  Limitations: Body habitus. Performing Technologist: Elmarie Lindau, RVT  Examination Guidelines: A complete evaluation includes B-mode imaging, spectral Doppler, color Doppler, and power Doppler as needed of all accessible portions of each vessel. Bilateral testing is considered an integral part of a complete examination. Limited examinations for reoccurring indications may be performed as noted. The reflux portion of the exam is performed with the patient in reverse  Trendelenburg.  +---------+---------------+---------+-----------+----------+-------------------+ RIGHT    CompressibilityPhasicitySpontaneityPropertiesThrombus Aging      +---------+---------------+---------+-----------+----------+-------------------+ CFV                                                   wound vac           +---------+---------------+---------+-----------+----------+-------------------+ FV Prox  Full                                                             +---------+---------------+---------+-----------+----------+-------------------+  FV Mid                  Yes                                               +---------+---------------+---------+-----------+----------+-------------------+ FV Distal                                             Not well visualized +---------+---------------+---------+-----------+----------+-------------------+ PFV      Full                                                             +---------+---------------+---------+-----------+----------+-------------------+ POP      Full           Yes      Yes                                      +---------+---------------+---------+-----------+----------+-------------------+   +---------+---------------+---------+-----------+----------+-------------------+ LEFT     CompressibilityPhasicitySpontaneityPropertiesThrombus Aging      +---------+---------------+---------+-----------+----------+-------------------+ CFV      Full           Yes      Yes                                      +---------+---------------+---------+-----------+----------+-------------------+ SFJ      Full                                                             +---------+---------------+---------+-----------+----------+-------------------+ FV Prox  Full                                                              +---------+---------------+---------+-----------+----------+-------------------+ FV Mid                                                urinary catheter                                                          attached            +---------+---------------+---------+-----------+----------+-------------------+ FV Distal  Not well visualized +---------+---------------+---------+-----------+----------+-------------------+ PFV      Full                                                             +---------+---------------+---------+-----------+----------+-------------------+ POP      Full           Yes      Yes                                      +---------+---------------+---------+-----------+----------+-------------------+     Summary: RIGHT: - There is no evidence of deep vein thrombosis in the lower extremity. However, portions of this examination were limited- see technologist comments above.  - No cystic structure found in the popliteal fossa.  LEFT: - There is no evidence of deep vein thrombosis in the lower extremity. However, portions of this examination were limited- see technologist comments above.  - No cystic structure found in the popliteal fossa.  *See table(s) above for measurements and observations. Electronically signed by Lonni Gaskins MD on 09/23/2023 at 6:05:45 PM.    Final         LOS: 8 days   Time spent= 43 mins    Deliliah Room, MD [facility]  If 7PM-7AM, please contact night-coverage  09/25/2023, 11:09 AM

## 2023-09-25 NOTE — Lactation Note (Addendum)
 This note was copied from a baby's chart. Lactation Consultation Note  Patient Name: Laura Benson Date: 09/25/2023 Age:34 days Reason for consult: Follow-up assessment;Primapara;1st time breastfeeding;Other (Comment);Early term 24-38.6wks;Infant < 5lbs (Maternal post partum pneumonia, BMI 68)  LC in to visit with P1 Mom of baby [name] born by C/Section at 37 wks.   Baby is IUGR and birth weight of 4 lbs 14.3 oz.  Baby is at a 1.2% weight loss.  Baby is exclusively bottle feeding EBM+/formula using an ultra premie Dr. Delores bottle.    Mom states she pumped consistently yesterday.   LC assisted Mom to pump with the hands free pumping band.  Mom appreciative and encouraged to do breast massage during pumping. Transitional milk flowing.  Mom to use the maintain mode.  Showed Mom how to adjust the strength and stimulate another let down with the drop button.  Reviewed importance of disassembling all the pump parts, washing, rinsing and placing in separate basin for drying on clean paper towel.    Mom was hoping to be discharged today, but probably tomorrow.  She is trial off the oxygen currently.  Plan- 1- STS with baby often 2- At least every 3 hrs, feeding baby per volume guidelines by paced bottle. 3- Pump both breasts on maintain mode for 20-30 mins, massaging breasts during pumping. 4- ask for help prn.  Lactation Tools Discussed/Used Tools: Pump;Flanges;Hands-free pumping top Flange Size: 21 Breast pump type: Double-Electric Breast Pump Pump Education: Setup, frequency, and cleaning;Milk Storage Reason for Pumping: support full milk supply Pumping frequency: encouraged to pump every 2-3 hrs/day and 3-4 hrs/night Pumped volume: 30 mL  Interventions Interventions: Breast feeding basics reviewed;Breast massage;Skin to skin;Hand express;DEBP;Education  Discharge Discharge Education: Engorgement and breast care Pump: Hands Free;DEBP;Personal WIC Program:  No  Consult Status Consult Status: Follow-up Date: 09/26/23 Follow-up type: In-patient    Laura Benson 09/25/2023, 2:08 PM

## 2023-09-29 ENCOUNTER — Telehealth: Payer: Self-pay

## 2023-09-29 ENCOUNTER — Other Ambulatory Visit: Payer: Self-pay

## 2023-09-29 ENCOUNTER — Ambulatory Visit: Admitting: *Deleted

## 2023-09-29 ENCOUNTER — Other Ambulatory Visit (HOSPITAL_COMMUNITY): Payer: Self-pay

## 2023-09-29 VITALS — BP 133/78 | HR 104 | Temp 98.5°F | Ht 66.0 in | Wt 399.8 lb

## 2023-09-29 DIAGNOSIS — Z4889 Encounter for other specified surgical aftercare: Secondary | ICD-10-CM

## 2023-09-29 MED ORDER — ALBUTEROL SULFATE HFA 108 (90 BASE) MCG/ACT IN AERS
2.0000 | INHALATION_SPRAY | Freq: Four times a day (QID) | RESPIRATORY_TRACT | 0 refills | Status: AC | PRN
  Filled 2023-09-29: qty 6.7, 25d supply, fill #0

## 2023-09-29 NOTE — Progress Notes (Signed)
 Pt presents for incision check following C/S on 8/23. Per chart review, pt noted to have cHTN w/superimposed pre-eclampsia during pregnancy. Pt had PP hemorrhage and recovery in ICU. She also developed cephalic superficial venous thrombosis and was prescribed therapeutic Lovenox . Pt reports she has been taking all medications as prescribed upon discharge from hospital. She requests refill of Albuteral inhaler as her current inhaler is expired. Rx e-prescribed. Prevena dressing removed and incision was found to be well healed. No redness, swelling or drainage was observed. Daily incision care and cleansing instructions given. Pt was advised she may use sanitary pad under pannus to keep incision dry. She should return to [facility] if she develops redness, swelling or drainage from incision. Pt voiced understanding of all information and instructions given.

## 2023-09-29 NOTE — Progress Notes (Addendum)
   IMPACT Mom Telephone Follow-Up  09/29/2023   Laura Benson is a 33 y.o. G1P1001 who is currently on PPD# (10) enrolled in the IMPACT study.  Laura Benson was discharged on 09/25/2023 with the following prescriptions: Acetaminophen  Extra Strength 500 MG Tabs, albuterol  108 (90 Base) MCG/ACT inhaler , amLODipine  10 MG tablet, amoxicillin -clavulanate 875-125 MG tablet, BD Pen Needle Nano 2nd Gen 32G X 4 MM Misc, buPROPion  150 MG 24 hr tablet, Dexcom G7 Sensor Misc, enalapril  10 MG tablet, enoxaparin  100 MG/ML injection, Excedrin  Tension Headache 500-65 MG Tabs per tablet, fluticasone  50 MCG/ACT nasal spray, furosemide  40 MG tablet, hydrALAZINE  50 MG tablet, Omnipod 5 DexG7G6 Pods Gen 5 Misc, oxyCODONE  5 MG immediate release tablet, potassium chloride  SA 20 MEQ tablet, Prenatal 28-0.8 MG Tabs, promethazine  25 MG tablet, sertraline  50 MG tablet, Stool Softener/Laxative 50-8.6 MG tablet  Did pick up their meds and have not begun/begun taking daily. They were able to obtain a blood pressure cuff. Denies headache, visual disturbances, epigastric pain, shortness of breath, chest pain or excessive swelling.  Today's BP reading is: 133/78 BP check appointment is scheduled for: 09/29/2023 Message sent to Dr. Aquilla Finder for closer follow up? No Sent to MAU for immediate evaluation? No  Edinburgh score on hospital discharge was: 16 Referred for IBH follow up at discharge? Waiting for a call from [facility] sent to [facility] for [facility] follow up? Yes  Invited to postpartum support group scheduled on October 15, 2023 at 10 AM  Feeding plan: Breastfeeding and Formula How long was breastfeeding attempted? Still attempting If breastfeeding at discharge, but now formula feeding, what was the reason for switching to formula? Other Need LC follow up? Yes   Call completed by Geraldina Louder, BS, IBCLC

## 2023-09-29 NOTE — Telephone Encounter (Signed)
 Called to see if she can be seen by Warren on October 15, 2023 at 10:15 AM. No answer. Left VM to return call. Will try to catch her at her appointment today at 3 PM.

## 2023-10-02 ENCOUNTER — Encounter: Admitting: Family Medicine

## 2023-10-02 NOTE — BH Specialist Note (Signed)
 Integrated Behavioral Health via Telemedicine Visit  10/05/2023 Laura Benson [id number]  Number of Integrated Behavioral Health Clinician visits: 3- Third Visit  Session Start time: 1018   Session End time: 1059  Total time in minutes: 41  Referring Provider: Norleen Rover, MD Laura/Family location: Home [facility] Provider location: Center for [facility] at [facility]  All persons participating in visit: Laura Benson and [facility] Sritha Chauncey   Types of Service: Individual psychotherapy and Video visit  I connected with Laura Benson and/or Laura Benson's n/a via  Telephone or Video Enabled Telemedicine Application  (Video is Caregility application) and verified that I am speaking with the correct person using two identifiers. Discussed confidentiality: Yes   I discussed the limitations of telemedicine and the availability of in person appointments.  Discussed there is a possibility of technology failure and discussed alternative modes of communication if that failure occurs.  I discussed that engaging in this telemedicine visit, they consent to the provision of behavioral healthcare and the services will be billed under their insurance.  Laura and/or legal guardian expressed understanding and consented to Telemedicine visit: Yes   Presenting Concerns: Laura and/or family reports the following symptoms/concerns: Increasing depression and anxiety, poor appetite, sleep deprivation; all attributed to adjusting to new motherhood after unexpected traumatic birthing experience (hoped for a natural birthing experience; instead, experienced induction, followed by a long labor, pre-eclampsia, sedation, ICU, hemorrhage, pneumonia, blood clot, cesarean, and officially can't have more children safely); continued health issues postpartum (using a walker, breathing and heart issues). Pt coped best with the support of  baby's father, who cared for baby while she was unable, during the first three days.  Duration of problem: Perinatal/postpartum; Severity of problem: severe  Laura and/or Family's Strengths/Protective Factors: Social connections, Concrete supports in place (healthy food, safe environments, etc.), and Sense of purpose  Goals Addressed: Laura will:  Reduce symptoms of: anxiety, depression, insomnia, and stress   Increase knowledge and/or ability of: healthy habits   Demonstrate ability to: Increase healthy adjustment to current life circumstances, Increase adequate support systems for Laura/family, and Increase motivation to adhere to plan of care  Progress towards Goals: Ongoing    Interventions: Interventions utilized:  Motivational Interviewing, Psychoeducation and/or Health Education, Link to [employer], and Supportive Reflection Standardized Assessments completed: GAD-7 and PHQ 9    Laura and/or Family Response: Laura agrees with treatment plan.   Clinical Assessment/Diagnosis  Severe episode of recurrent major depressive disorder, without psychotic features (HCC)  GAD (generalized anxiety disorder).   Laura may benefit from psychoeducation and brief therapeutic interventions regarding coping with symptoms of depression and anxiety .  Plan: Follow up with behavioral health clinician on : Two weeks Behavioral recommendations:  -Continue taking Wellbutrin  as prescribed -Continue prioritizing healthy self-care (regular meals, adequate rest; allowing practical help from supportive friends and family) until at least postpartum medical appointment -Consider new mom support group as needed at either [email or web address] or [email or web address]  -Consider discussing with father about having baby's father over to care for baby so you can get at least 3-4 hours of uninterrupted sleep Referral(s): Integrated [employer] (In Clinic)  I discussed  the assessment and treatment plan with the Laura and/or parent/guardian. They were provided an opportunity to ask questions and all were answered. They agreed with the plan and demonstrated an understanding of the instructions.   They were advised to call back or seek an in-person evaluation if the symptoms worsen or  if the condition fails to improve as anticipated.  Warren JAYSON Mering, LCSW     10/05/2023   10:43 AM 07/02/2023    2:34 PM 03/24/2023    4:41 PM 12/04/2021   11:23 AM 07/01/2021    1:06 PM  Depression screen PHQ 2/9  Decreased Interest 3 1 1  0 3  Down, Depressed, Hopeless 3 0 1 3 3   PHQ - 2 Score 6 1 2 3 6   Altered sleeping 3 0 1 3 3   Tired, decreased energy 3 1 3 2 2   Change in appetite 3 0 1 0 0  Feeling bad or failure about yourself  3 0 1 3 1   Trouble concentrating 3 0 0 3 2  Moving slowly or fidgety/restless 0 0 0 0 0  Suicidal thoughts 0 0 0  0  PHQ-9 Score 21 2 8 14 14   Difficult doing work/chores    Not difficult at all Somewhat difficult      10/05/2023   10:50 AM 07/02/2023    2:36 PM 03/24/2023    4:42 PM 01/23/2016    3:25 PM  GAD 7 : Generalized Anxiety Score  Nervous, Anxious, on Edge 3 0 2 3  Control/stop worrying 3 1 1 3   Worry too much - different things 3 1 1 3   Trouble relaxing 3 0 1 3  Restless 1 0 0 3  Easily annoyed or irritable 3 1 1 3   Afraid - awful might happen 3 0 1 3  Total GAD 7 Score 19 3 7 21         09/22/2023    9:39 AM  Edinburgh Postnatal Depression Scale Screening Tool  I have been able to laugh and see the funny side of things. 1  I have looked forward with enjoyment to things. 0  I have blamed myself unnecessarily when things went wrong. 2  I have been anxious or worried for no good reason. 2  I have felt scared or panicky for no good reason. 2  Things have been getting on top of me. 2  I have been so unhappy that I have had difficulty sleeping. 2  I have felt sad or miserable. 3  I have been so unhappy that I have been  crying. 2  The thought of harming myself has occurred to me. 0  Edinburgh Postnatal Depression Scale Total 16

## 2023-10-05 ENCOUNTER — Telehealth: Payer: Self-pay

## 2023-10-05 ENCOUNTER — Ambulatory Visit (INDEPENDENT_AMBULATORY_CARE_PROVIDER_SITE_OTHER): Payer: MEDICAID | Admitting: Clinical

## 2023-10-05 DIAGNOSIS — F332 Major depressive disorder, recurrent severe without psychotic features: Secondary | ICD-10-CM

## 2023-10-05 DIAGNOSIS — F411 Generalized anxiety disorder: Secondary | ICD-10-CM

## 2023-10-05 NOTE — Patient Instructions (Signed)
 Center for [facility] Healthcare at [facility] for Women [address] [place], [place] [address] [phone] (main office) [phone] [facility] office)  New Parent Support Groups [email or web address]

## 2023-10-05 NOTE — Telephone Encounter (Signed)
   IMPACT Mom Telephone Follow-Up  10/05/2023   Laura Benson is a 33 y.o. G1P1001 who is 2 weeks postpartum and enrolled in the IMPACT study.  Laura Benson was discharged on  09/25/2023  with the following prescriptions: Acetaminophen  Extra Strength 500 MG Tabs, albuterol  108 (90 Base) MCG/ACT inhaler , amLODipine  10 MG tablet, amoxicillin -clavulanate 875-125 MG tablet, BD Pen Needle Nano 2nd Gen 32G X 4 MM Misc, buPROPion  150 MG 24 hr tablet, Dexcom G7 Sensor Misc, enalapril  10 MG tablet, enoxaparin  100 MG/ML injection, Excedrin  Tension Headache 500-65 MG Tabs per tablet, fluticasone  50 MCG/ACT nasal spray, furosemide  40 MG tablet, hydrALAZINE  50 MG tablet, Omnipod 5 DexG7G6 Pods Gen 5 Misc, oxyCODONE  5 MG immediate release tablet, potassium chloride  SA 20 MEQ tablet, Prenatal 28-0.8 MG Tabs, promethazine  25 MG tablet, sertraline  50 MG tablet, Stool Softener/Laxative 50-8.6 MG tablet  IS still taking discharge prescriptions except Acetaminophen  Extra Strength 500 MG Tabs, albuterol  108 (90 Base) MCG/ACT inhaler , amLODipine  10 MG tablet, amoxicillin -clavulanate 875-125 MG tablet, BD Pen Needle Nano 2nd Gen 32G X 4 MM Misc,fluticasone  50 MCG/ACT nasal spray, furosemide  40 MG tablet,  oxyCODONE  5 MG immediate release tablet, potassium chloride  SA 20 MEQ tablet Attended postpartum follow up on: N/A OR has postpartum follow up scheduled for: 10/20/2023 Still has a diagnosis of postpartum hypertension? Yes  Has OB cardiology follow up scheduled? 10/08/2023  Today's BP reading is: 139/90 Next cardiology appointment is scheduled for: 10/08/2023 Message sent to Dr. Aquilla Finder for closer follow up? Yes Sent to MAU for immediate evaluation? No  Has attended postpartum support group? No  Invited to postpartum support group on 10/15/23  Edinburgh score on hospital discharge was:16 Edinburgh score at postpartum follow up was: N/A Has referral for IBH follow up? Yes  Message  sent to [facility] for [facility] follow up? No had appointment today   Feeding plan: How long was breastfeeding attempted?Breastfeeding and formula  If breastfeeding at discharge, but now formula feeding, what was the reason for switching to formula? Other still breastfeeding Need LC follow up? Yes scheduled for 10/06/23 Message sent to [facility] for [facility] scheduling? No  Call completed by Geraldina Louder, BS, IBCLC

## 2023-10-07 ENCOUNTER — Encounter: Payer: Self-pay | Admitting: *Deleted

## 2023-10-08 ENCOUNTER — Other Ambulatory Visit (HOSPITAL_COMMUNITY): Payer: Self-pay

## 2023-10-08 ENCOUNTER — Ambulatory Visit: Attending: Cardiology | Admitting: Cardiology

## 2023-10-09 ENCOUNTER — Encounter: Admitting: Family Medicine

## 2023-10-09 ENCOUNTER — Telehealth: Payer: Self-pay

## 2023-10-09 NOTE — Telephone Encounter (Signed)
 Called pt, left a message for pt to return call to be seen at [facility] Women today by Medford SQUIBB, pharmacist.

## 2023-10-10 ENCOUNTER — Inpatient Hospital Stay (HOSPITAL_COMMUNITY): Admit: 2023-10-10

## 2023-10-13 ENCOUNTER — Telehealth: Payer: Self-pay

## 2023-10-13 NOTE — Telephone Encounter (Signed)
 Called to complete weekly IMPACT Mom Study. No answer. Left VM to return call.

## 2023-10-14 ENCOUNTER — Telehealth: Payer: Self-pay | Admitting: Family Medicine

## 2023-10-14 NOTE — Telephone Encounter (Signed)
 Unable to contact [employer] as not having no contact information.    Guhan Bruington,RN  10/14/23

## 2023-10-14 NOTE — Telephone Encounter (Signed)
 Received a call from a nurse who works with [employer]. She is calling because she is having trouble getting in touch with this patient in regards to her needing oxygen. The nurse says that they have an authorization for it but is not sure who sent it. It may have come from the hospital but there are no orders in. She is calling to see if we are aware if the patient needs this oxygen or not.

## 2023-10-15 NOTE — BH Specialist Note (Unsigned)
 Integrated Behavioral Health via Telemedicine Visit  10/21/2023 Makalah Asberry [id number]  Number of Integrated Behavioral Health Clinician visits: 4- Fourth Visit  Session Start time: 1016   Session End time: 1046  Total time in minutes: 30  Referring Provider: Norleen Rover, MD Patient/Family location: Home [facility] Provider location: Center for [facility] Healthcare at [facility] for Women  All persons participating in visit: [name] and [facility] Marilin Kofman   Types of Service: Individual psychotherapy and Video visit  I connected with Suzen Hurman Chernick and/or Suzen Hurman Handy's n/a via  Telephone or Video Enabled Telemedicine Application  (Video is Caregility application) and verified that I am speaking with the correct person using two identifiers. Discussed confidentiality: Yes   I discussed the limitations of telemedicine and the availability of in person appointments.  Discussed there is a possibility of technology failure and discussed alternative modes of communication if that failure occurs.  I discussed that engaging in this telemedicine visit, they consent to the provision of behavioral healthcare and the services will be billed under their insurance.  Patient and/or legal guardian expressed understanding and consented to Telemedicine visit: Yes   Presenting Concerns: Patient and/or family reports the following symptoms/concerns: Ongoing adjustment to new  motherhood, uncertainty regarding return to work with current back pain and other health issues; requests refills of BH and other medications.  Duration of problem: Postpartum ; Severity of problem: moderate  Patient and/or Family's Strengths/Protective Factors: Social connections, Concrete supports in place (healthy food, safe environments, etc.), and Sense of purpose  Goals Addressed: Patient will:  Reduce symptoms of: anxiety, depression, insomnia, and stress    Increase knowledge and/or ability of: healthy habits   Demonstrate ability to: Increase motivation to adhere to plan of care  Progress towards Goals: Ongoing    Interventions: Interventions utilized:  Motivational Interviewing, Medication Monitoring, and Supportive Reflection Standardized Assessments completed: GAD-7 and PHQ 9    Patient and/or Family Response: Patient agrees with treatment plan.   Clinical Assessment/Diagnosis  Recurrent major depressive disorder, in full remission  GAD (generalized anxiety disorder)    Patient may benefit from continued therapeutic intervention   Plan: Follow up with behavioral health clinician on : Two weeks Behavioral recommendations:  -Continue taking Wellbutrin  and other medications as prescribed -Continue prioritizing healthy self-care (regular meals, adequate rest; allowing practical help from supportive friends and family) until at least postpartum medical appointment -Consider new mom support group as needed at either [email or web address] or [email or web address]  -Discuss medical concerns and birth control at upcoming medical appointment -Continue plan to introduce baby to grandpa  Referral(s): Integrated [employer] (In Clinic)  I discussed the assessment and treatment plan with the patient and/or parent/guardian. They were provided an opportunity to ask questions and all were answered. They agreed with the plan and demonstrated an understanding of the instructions.   They were advised to call back or seek an in-person evaluation if the symptoms worsen or if the condition fails to improve as anticipated.  Warren JAYSON Mering, LCSW     10/19/2023   10:21 AM 10/05/2023   10:43 AM 07/02/2023    2:34 PM 03/24/2023    4:41 PM 12/04/2021   11:23 AM  Depression screen PHQ 2/9  Decreased Interest 0 3 1 1  0  Down, Depressed, Hopeless 1 3 0 1 3  PHQ - 2 Score 1 6 1 2 3   Altered sleeping 0 3 0 1 3  Tired, decreased energy 1  3 1  3 2  Change in appetite 0 3 0 1 0  Feeling bad or failure about yourself  1 3 0 1 3  Trouble concentrating 0 3 0 0 3  Moving slowly or fidgety/restless 0 0 0 0 0  Suicidal thoughts 0 0 0 0   PHQ-9 Score 3 21 2 8 14   Difficult doing work/chores     Not difficult at all       10/19/2023   10:23 AM 10/05/2023   10:50 AM 07/02/2023    2:36 PM 03/24/2023    4:42 PM  GAD 7 : Generalized Anxiety Score  Nervous, Anxious, on Edge 1 3 0 2  Control/stop worrying 3 3 1 1   Worry too much - different things 3 3 1 1   Trouble relaxing 1 3 0 1  Restless 0 1 0 0  Easily annoyed or irritable 1 3 1 1   Afraid - awful might happen 1 3 0 1  Total GAD 7 Score 10 19 3  7

## 2023-10-19 ENCOUNTER — Ambulatory Visit (INDEPENDENT_AMBULATORY_CARE_PROVIDER_SITE_OTHER): Admitting: Clinical

## 2023-10-19 DIAGNOSIS — F411 Generalized anxiety disorder: Secondary | ICD-10-CM

## 2023-10-19 DIAGNOSIS — F3342 Major depressive disorder, recurrent, in full remission: Secondary | ICD-10-CM

## 2023-10-20 ENCOUNTER — Ambulatory Visit: Admitting: Family Medicine

## 2023-10-21 ENCOUNTER — Telehealth: Payer: Self-pay

## 2023-10-21 NOTE — Telephone Encounter (Signed)
 Called to complete weekly IMPACT Mom call. No answer. Left VM to return call. Will reach out at a later time or see at appointment tomorrow.  Hughie Melroy BS, IBCLC

## 2023-10-21 NOTE — Patient Instructions (Signed)
 Center for [facility] Healthcare at [facility] for Women [address] [place], [place] [address] [phone] (main office) [phone] (Adalina Dopson's office)

## 2023-10-22 ENCOUNTER — Other Ambulatory Visit (HOSPITAL_COMMUNITY): Payer: Self-pay

## 2023-10-22 ENCOUNTER — Other Ambulatory Visit: Payer: Self-pay

## 2023-10-22 ENCOUNTER — Ambulatory Visit (INDEPENDENT_AMBULATORY_CARE_PROVIDER_SITE_OTHER): Admitting: Family Medicine

## 2023-10-22 DIAGNOSIS — D62 Acute posthemorrhagic anemia: Secondary | ICD-10-CM

## 2023-10-22 DIAGNOSIS — I82612 Acute embolism and thrombosis of superficial veins of left upper extremity: Secondary | ICD-10-CM | POA: Diagnosis not present

## 2023-10-22 DIAGNOSIS — F339 Major depressive disorder, recurrent, unspecified: Secondary | ICD-10-CM

## 2023-10-22 DIAGNOSIS — Z98891 History of uterine scar from previous surgery: Secondary | ICD-10-CM | POA: Diagnosis not present

## 2023-10-22 DIAGNOSIS — O119 Pre-existing hypertension with pre-eclampsia, unspecified trimester: Secondary | ICD-10-CM

## 2023-10-22 DIAGNOSIS — E1165 Type 2 diabetes mellitus with hyperglycemia: Secondary | ICD-10-CM | POA: Diagnosis not present

## 2023-10-22 DIAGNOSIS — G4733 Obstructive sleep apnea (adult) (pediatric): Secondary | ICD-10-CM

## 2023-10-22 MED ORDER — BUPROPION HCL ER (XL) 150 MG PO TB24
300.0000 mg | ORAL_TABLET | Freq: Every day | ORAL | 3 refills | Status: DC
  Filled 2023-10-22: qty 60, 30d supply, fill #0

## 2023-10-22 MED ORDER — BUPROPION HCL ER (XL) 300 MG PO TB24
300.0000 mg | ORAL_TABLET | Freq: Every day | ORAL | 3 refills | Status: AC
  Filled 2023-10-22: qty 90, 90d supply, fill #0

## 2023-10-22 NOTE — Progress Notes (Unsigned)
 Post Partum Visit Note  Laura Benson is a 33 y.o. G82P1001 female who presents for a postpartum visit. She is 4 weeks postpartum following a primary cesarean section.  I have fully reviewed the prenatal and intrapartum course. The delivery was at 37 gestational weeks.  Anesthesia: epidural. Postpartum course has been uneventful. Baby is doing well. Baby is feeding by both breast and bottle - Similac Neosure. Bleeding no bleeding. Bowel function is normal. Bladder function is normal. Patient is sexually active. Contraception method is condoms. Postpartum depression screening: positive.   The pregnancy intention screening data noted above was reviewed. Potential methods of contraception were discussed. The patient elected to proceed with No data recorded.   Edinburgh Postnatal Depression Scale - 10/22/23 1558       Edinburgh Postnatal Depression Scale:  In the Past 7 Days   I have been able to laugh and see the funny side of things. 3    I have looked forward with enjoyment to things. 0    I have blamed myself unnecessarily when things went wrong. 0    I have been anxious or worried for no good reason. 3    I have felt scared or panicky for no good reason. 2    Things have been getting on top of me. 2    I have been so unhappy that I have had difficulty sleeping. 3    I have felt sad or miserable. 3    I have been so unhappy that I have been crying. 2    The thought of harming myself has occurred to me. 0    Edinburgh Postnatal Depression Scale Total 18         Having lots of trouble sleeping but worried about taking a supplement because she doesn't have help during the night because partner works third shift. *** Wondering about whether she should extend her disability period due to ongoing issues with back pain, still using a walker at home but when out and about uses the stroller for stability. ***   Health Maintenance Due  Topic Date Due   Hepatitis B Vaccines  19-59 Average Risk (1 of 3 - 19+ 3-dose series) Never done   HPV VACCINES (1 - 3-dose SCDM series) Never done   Diabetic kidney evaluation - Urine ACR  01/22/2017   OPHTHALMOLOGY EXAM  11/27/2017   Pneumococcal Vaccine (2 of 2 - PCV) 10/31/2020   FOOT EXAM  07/02/2022   Influenza Vaccine  Never done   COVID-19 Vaccine (1 - 2024-25 season) Never done    The following portions of the patient's history were reviewed and updated as appropriate: allergies, current medications, past family history, past medical history, past social history, past surgical history, and problem list.  Review of Systems Pertinent items noted in HPI and remainder of comprehensive ROS otherwise negative.  Objective:  BP 112/78   Pulse 82   Wt (!) 399 lb 14.4 oz (181.4 kg)   LMP 01/03/2023   BMI 64.55 kg/m    General:  alert, cooperative, and appears stated age   Breasts:  not indicated  Lungs: Comfortalbe on room air  Wound {Wound assessment:11097}  GU exam:  not indicated        Assessment:   Postpartum care and examination  Uncontrolled type 2 diabetes mellitus with hyperglycemia (HCC)  Superficial venous thrombosis of arm, left  S/P cesarean section  Postoperative anemia due to acute blood loss  Obstructive sleep apnea  Morbid  obesity (HCC)  Depression, recurrent (HCC)  Chronic hypertension with superimposed preeclampsia  Normal*** postpartum exam.   Plan:   Essential components of care per ACOG recommendations:  1.  Mood and well being: Patient with positive depression screening today. Reviewed local resources for support.  - Patient tobacco use? {tobacco use:25506}  - hx of drug use? {yes/no:25505}    2. Infant care and feeding:  -Patient currently breastmilk feeding? {yes/no:25502}  -Social determinants of health (SDOH) reviewed in EPIC. No concerns***The following needs were identified***  3. Sexuality, contraception and birth spacing - Patient does not want a pregnancy in  the next year.  Desired family size is 1 children.  - Reviewed reproductive life planning. Reviewed contraceptive methods based on pt preferences and effectiveness.  Patient desired condoms for now but strongly desires BTL.   - Discussed birth spacing of 18 months  4. Sleep and fatigue -Encouraged family/partner/community support of 4 hrs of uninterrupted sleep to help with mood and fatigue  5. Physical Recovery  - Discussed patients delivery and complications. She describes her labor as {description:25511} - Patient had a {CHL AMB [phone]}. Patient had a {laceration:25518} laceration. Perineal healing reviewed. Patient expressed understanding - Patient has urinary incontinence? {yes/no:25515} - Patient {ACTION; IS/IS WNU:[id number]} safe to resume physical and sexual activity  6.  Health Maintenance - HM due items addressed {Yes or If no, why not?:20788} - Last pap smear  Diagnosis  Date Value Ref Range Status  03/24/2023   Final   - Negative for intraepithelial lesion or malignancy (NILM)   Pap smear {done:10129} at today's visit.  -Breast Cancer screening indicated? {indicated:25516}  7. Chronic Disease/Pregnancy Condition follow up: {Follow up:25499} 1. Postpartum care and examination   2. Uncontrolled type 2 diabetes mellitus with hyperglycemia (HCC)   3. Superficial venous thrombosis of arm, left   4. S/P cesarean section   5. Postoperative anemia due to acute blood loss   6. Obstructive sleep apnea   7. Morbid obesity (HCC)   8. Depression, recurrent (HCC)   9. Chronic hypertension with superimposed preeclampsia    3. Referred to heme, still on lovenox  9. BP is beautiful, continue current management 6. Going to pulm   - PCP follow up   Donnice CHRISTELLA Carolus, MD/MPH Attending Family Medicine Physician, [facility] for [facility], [facility]

## 2023-10-22 NOTE — Progress Notes (Addendum)
   IMPACT Mom Telephone Follow-Up  10/22/2023   Marrah Vanevery Carrithers is a 33 y.o. G1P1001 who is 4 weeks postpartum and enrolled in the IMPACT study.  Cate Oravec Wee was discharged on 09/25/2023 with the following prescriptions: IS still taking discharge prescriptions. Attended postpartum follow up on: Yes 10/22/2023 OR has postpartum follow up scheduled for: Still has a diagnosis of postpartum hypertension? Yes Has OB cardiology follow up scheduled? Needs to reschedule  Today's BP reading is: 112/78 Next cardiology appointment is scheduled for: Needs to reschedule Message sent to Dr. Aquilla Finder for closer follow up? No Sent to MAU for immediate evaluation? No  Has attended postpartum support group? No  Invited to postpartum support group on 11/12/2023  Edinburgh score on hospital discharge was: 66 Edinburgh score at postpartum follow up was: 18 Has referral for IBH follow up? Yes Message sent to [facility] for [facility] follow up? Yes   Feeding plan: breastfeeding and formula How long was breastfeeding attempted? Still attempting If breastfeeding at discharge, but now formula feeding, what was the reason for switching to formula? Other Need LC follow up? No Message sent to [facility] for [facility] scheduling? No  Call completed by Geraldina Louder, BS, IBCLC

## 2023-10-23 ENCOUNTER — Other Ambulatory Visit (HOSPITAL_COMMUNITY): Payer: Self-pay

## 2023-10-29 ENCOUNTER — Emergency Department (HOSPITAL_COMMUNITY)

## 2023-10-29 ENCOUNTER — Emergency Department (HOSPITAL_COMMUNITY)
Admission: EM | Admit: 2023-10-29 | Discharge: 2023-10-29 | Disposition: A | Discharge status: Home / Self Care | Attending: Emergency Medicine | Admitting: Emergency Medicine

## 2023-10-29 DIAGNOSIS — R109 Unspecified abdominal pain: Secondary | ICD-10-CM

## 2023-10-29 DIAGNOSIS — Z794 Long term (current) use of insulin: Secondary | ICD-10-CM | POA: Diagnosis not present

## 2023-10-29 DIAGNOSIS — I82611 Acute embolism and thrombosis of superficial veins of right upper extremity: Secondary | ICD-10-CM | POA: Diagnosis not present

## 2023-10-29 DIAGNOSIS — Z79899 Other long term (current) drug therapy: Secondary | ICD-10-CM | POA: Diagnosis not present

## 2023-10-29 DIAGNOSIS — I10 Essential (primary) hypertension: Secondary | ICD-10-CM | POA: Insufficient documentation

## 2023-10-29 DIAGNOSIS — I82890 Acute embolism and thrombosis of other specified veins: Secondary | ICD-10-CM

## 2023-10-29 DIAGNOSIS — M79601 Pain in right arm: Secondary | ICD-10-CM | POA: Diagnosis present

## 2023-10-29 DIAGNOSIS — R1030 Lower abdominal pain, unspecified: Secondary | ICD-10-CM | POA: Insufficient documentation

## 2023-10-29 DIAGNOSIS — E119 Type 2 diabetes mellitus without complications: Secondary | ICD-10-CM | POA: Insufficient documentation

## 2023-10-29 LAB — CBC WITH DIFFERENTIAL/PLATELET
Abs Immature Granulocytes: 0.01 K/uL (ref 0.00–0.07)
Basophils Absolute: 0 K/uL (ref 0.0–0.1)
Basophils Relative: 1 %
Eosinophils Absolute: 0.1 K/uL (ref 0.0–0.5)
Eosinophils Relative: 2 %
HCT: 33.1 % — ABNORMAL LOW (ref 36.0–46.0)
Hemoglobin: 9.7 g/dL — ABNORMAL LOW (ref 12.0–15.0)
Immature Granulocytes: 0 %
Lymphocytes Relative: 41 %
Lymphs Abs: 1.4 K/uL (ref 0.7–4.0)
MCH: 23.4 pg — ABNORMAL LOW (ref 26.0–34.0)
MCHC: 29.3 g/dL — ABNORMAL LOW (ref 30.0–36.0)
MCV: 80 fL (ref 80.0–100.0)
Monocytes Absolute: 0.7 K/uL (ref 0.1–1.0)
Monocytes Relative: 20 %
Neutro Abs: 1.3 K/uL — ABNORMAL LOW (ref 1.7–7.7)
Neutrophils Relative %: 36 %
Platelets: 269 K/uL (ref 150–400)
RBC: 4.14 MIL/uL (ref 3.87–5.11)
RDW: 13.9 % (ref 11.5–15.5)
WBC: 3.4 K/uL — ABNORMAL LOW (ref 4.0–10.5)
nRBC: 0 % (ref 0.0–0.2)

## 2023-10-29 LAB — COMPREHENSIVE METABOLIC PANEL WITH GFR
ALT: 9 U/L (ref 0–44)
AST: 13 U/L — ABNORMAL LOW (ref 15–41)
Albumin: 3.9 g/dL (ref 3.5–5.0)
Alkaline Phosphatase: 59 U/L (ref 38–126)
Anion gap: 9 (ref 5–15)
BUN: 7 mg/dL (ref 6–20)
CO2: 25 mmol/L (ref 22–32)
Calcium: 8.8 mg/dL — ABNORMAL LOW (ref 8.9–10.3)
Chloride: 105 mmol/L (ref 98–111)
Creatinine, Ser: 0.89 mg/dL (ref 0.44–1.00)
GFR, Estimated: >60 mL/min (ref >60)
Glucose, Bld: 107 mg/dL — ABNORMAL HIGH (ref 70–99)
Potassium: 3.8 mmol/L (ref 3.5–5.1)
Sodium: 139 mmol/L (ref 135–145)
Total Bilirubin: 0.4 mg/dL (ref 0.0–1.2)
Total Protein: 7 g/dL (ref 6.5–8.1)

## 2023-10-29 LAB — LIPASE, BLOOD: Lipase: 16 U/L (ref 11–51)

## 2023-10-29 LAB — HCG, SERUM, QUALITATIVE: Preg, Serum: NEGATIVE

## 2023-10-29 MED ORDER — IOHEXOL 300 MG/ML  SOLN
100.0000 mL | Freq: Once | INTRAMUSCULAR | Status: AC | PRN
  Administered 2023-10-29: 100 mL via INTRAVENOUS

## 2023-10-29 MED ORDER — HYDRALAZINE HCL 25 MG PO TABS
50.0000 mg | ORAL_TABLET | Freq: Once | ORAL | Status: AC
  Administered 2023-10-29: 50 mg via ORAL
  Filled 2023-10-29: qty 2

## 2023-10-29 NOTE — ED Notes (Signed)
 Pt taken to CT.

## 2023-10-29 NOTE — Progress Notes (Signed)
 VASCULAR LAB    Right upper extremity venous duplex has been performed.  See CV proc for preliminary results.  Gave verbal report to Dr. Randol LIS, [facility], RVT 10/29/2023, 6:31 PM

## 2023-10-29 NOTE — Discharge Instructions (Addendum)
 The CT scan did not show any signs of acute abnormality.  There was some signs of skin thickening your incision site.  Exam you do not appear to have an infection in that area.  This is likely related to continued healing of the surgical site.  The ultrasound did not show a clot in the deep vein but does show a superficial vein thrombosis.  The Lovenox  that you were previously prescribed should help with that.  Your blood pressure was elevated today.  Make sure to continue your blood pressure medications.  Follow-up with your OB/GYN doctor to be rechecked

## 2023-10-29 NOTE — ED Triage Notes (Signed)
 6 weeks postpartum - various complications during birth including hemorrhaging and blood clot in LEFT arm during that time. C/section. Now having same symptoms in RIGHT arm - pain concerned another blood clot is present.  Incision site healed from c/section, but patient is still experiencing pain.

## 2023-10-29 NOTE — ED Notes (Signed)
Sonographer at bedside.

## 2023-10-29 NOTE — ED Provider Notes (Signed)
 [place] EMERGENCY DEPARTMENT AT [facility] Provider Note   CSN: [id number] Arrival date & time: 10/29/23  1626     Patient presents with: Arm pain and abdominal pain  Laura Benson is a 33 y.o. female.      Patient has a history of hypertension diabetes morbid obesity and is 6 weeks postpartum.  She had a very complicated medical course including C-section, intubation, pulmonary edema, postpartum hemorrhage, superficial DVT, ICU stay.  Patient also had a superficial thrombus in the left cephalic vein patient had been recovering well.  She saw her doctor on the 25th.  Patient states in the last few days she started having pain in her lower abdomen.  She is not having dysuria.  No vomiting or diarrhea.  The pain is in her lower abdomen.  Patient also was experiencing pain and discomfort in her right arm.  That has been ongoing for the last couple of days.  Patient is supposed to be on Lovenox  for 6 weeks following her delivery.  Patient states she has not been compliant with that medication because she was noticed some bruising in her abdomen.  Patient states she did start taking it again when she became concerned about a blood clot in her arm.  She is not having any chest pain or shortness of breath.  Patient called her OB doctor was instructed to come to the ED  Prior to Admission medications   Medication Sig Start Date End Date Taking? Authorizing Provider  acetaminophen  (TYLENOL ) 500 MG tablet Take 2 tablets (1,000 mg total) by mouth every 6 (six) hours. 09/25/23   Erik Kieth BROCKS, MD  acetaminophen -caffeine (EXCEDRIN  TENSION HEADACHE) 500-65 MG TABS per tablet Take 2 tablets by mouth every 8 (eight) hours as needed. Patient not taking: Reported on 09/29/2023 03/05/23   Cresenzo, John V, MD  albuterol  (VENTOLIN  HFA) 108 [phone] Base) MCG/ACT inhaler Inhale 2 puffs into the lungs every 6 (six) hours as needed for wheezing or shortness of breath. 09/29/23   Eveline Lynwood MATSU,  MD  amLODipine  (NORVASC ) 10 MG tablet Take 1 tablet (10 mg total) by mouth daily. 08/14/23   Cresenzo, John V, MD  amoxicillin -clavulanate (AUGMENTIN ) 875-125 MG tablet Take 1 tablet by mouth 2 (two) times daily. 09/25/23   Erik Kieth BROCKS, MD  buPROPion  (WELLBUTRIN  XL) 300 MG 24 hr tablet Take 1 tablet (300 mg total) by mouth daily. 10/22/23   [name], MD  Continuous Glucose Receiver (DEXCOM G7 RECEIVER) DEVI 1 Units by Does not apply route daily. 05/20/23   [name], MD  Continuous Glucose Sensor (DEXCOM G7 SENSOR) MISC Apply one sensor every 10 days ICD10: O24.414 (Gestational diabetes, insulin  dependent) 07/06/23   [name], MD  enalapril  (VASOTEC ) 10 MG tablet Take 0.5 tablets (5 mg total) by mouth daily. 09/26/23   Erik Kieth BROCKS, MD  enoxaparin  (LOVENOX ) 100 MG/ML injection Inject 2 mLs (200 mg total) into the skin every 12 (twelve) hours. 09/26/23 11/07/23  Erik Kieth BROCKS, MD  fluticasone  (FLONASE ) 50 MCG/ACT nasal spray Place 2 sprays into both nostrils daily. Patient not taking: Reported on 09/29/2023 11/22/22   Henderly, Britni A, PA-C  furosemide  (LASIX ) 40 MG tablet Take 1 tablet (40 mg total) by mouth daily for 5 days. 09/26/23 10/01/23  Erik Kieth BROCKS, MD  hydrALAZINE  (APRESOLINE ) 50 MG tablet Take 1 tablet (50 mg total) by mouth 3 (three) times daily. 09/25/23 10/25/23  Erik Kieth BROCKS, MD  Insulin  Disposable Pump (  OMNIPOD 5 DEXG7G6 PODS GEN 5) MISC 1 Device by Does not apply route every 3 (three) days. Patient not taking: Reported on 09/29/2023 07/24/23   Cresenzo, John V, MD  Insulin  Pen Needle (BD PEN NEEDLE NANO 2ND GEN) 32G X 4 MM MISC 1 Units by Does not apply route daily. Patient not taking: Reported on 09/29/2023 06/08/23   [name], MD  oxyCODONE  (OXY IR/ROXICODONE ) 5 MG immediate release tablet Take 1 tablet (5 mg total) by mouth every 4 (four) hours as needed for moderate pain (pain score 4-6). Patient not taking:  Reported on 09/29/2023 09/25/23   Erik Kieth BROCKS, MD  potassium chloride  SA (KLOR-CON  M) 20 MEQ tablet Take 2 tablets (40 mEq total) by mouth daily. 09/26/23   Erik Kieth BROCKS, MD  Prenatal 28-0.8 MG TABS Take 1 tablet by mouth daily. 08/28/23   [name], MD  promethazine  (PHENERGAN ) 25 MG tablet Take 1 tablet (25 mg total) by mouth every 6 (six) hours as needed for nausea or vomiting. Patient not taking: Reported on 09/29/2023 03/04/23   Vannie Cornell SAUNDERS, CNM  senna-docusate (SENOKOT-S) 8.6-50 MG tablet Take 2 tablets by mouth daily. 09/26/23   Erik Kieth BROCKS, MD  sertraline  (ZOLOFT ) 50 MG tablet Take 1 tablet (50 mg total) by mouth daily. Patient not taking: Reported on 09/29/2023 07/06/23   [name], MD    Allergies: Patient has no known allergies.    Review of Systems  Updated Vital Signs BP (!) 165/95   Pulse 84   Temp 98.4 F (36.9 C)   Resp (!) 98   SpO2 99%   Physical Exam Vitals and nursing note reviewed.  Constitutional:      Appearance: She is well-developed. She is not diaphoretic.  HENT:     Head: Normocephalic and atraumatic.     Right Ear: External ear normal.     Left Ear: External ear normal.  Eyes:     General: No scleral icterus.       Right eye: No discharge.        Left eye: No discharge.     Conjunctiva/sclera: Conjunctivae normal.  Neck:     Trachea: No tracheal deviation.  Cardiovascular:     Rate and Rhythm: Normal rate.  Pulmonary:     Effort: Pulmonary effort is normal. No respiratory distress.     Breath sounds: No stridor.  Abdominal:     General: There is no distension.     Tenderness: There is abdominal tenderness.     Comments: Suprapubic region  Musculoskeletal:        General: Tenderness (right upper extremity) present. No swelling or deformity.     Cervical back: Neck supple.  Skin:    General: Skin is warm and dry.     Findings: No rash.  Neurological:     Mental Status: She is alert. Mental status is at  baseline.     Cranial Nerves: No dysarthria or facial asymmetry.     Motor: No seizure activity.     (all labs ordered are listed, but only abnormal results are displayed) Labs Reviewed  COMPREHENSIVE METABOLIC PANEL WITH GFR - Abnormal; Notable for the following components:      Result Value   Glucose, Bld 107 (*)    Calcium 8.8 (*)    AST 13 (*)    All other components within normal limits  CBC WITH DIFFERENTIAL/PLATELET - Abnormal; Notable for the following components:   WBC 3.4 (*)  Hemoglobin 9.7 (*)    HCT 33.1 (*)    MCH 23.4 (*)    MCHC 29.3 (*)    Neutro Abs 1.3 (*)    All other components within normal limits  LIPASE, BLOOD  HCG, SERUM, QUALITATIVE  URINALYSIS, ROUTINE W REFLEX MICROSCOPIC    EKG: None  Radiology: CT ABDOMEN PELVIS W CONTRAST Result Date: 10/29/2023 EXAM: CT ABDOMEN AND PELVIS WITH CONTRAST 10/29/2023 07:52:17 PM TECHNIQUE: CT of the abdomen and pelvis was performed with the administration of 100 mL of iohexol  (OMNIPAQUE ) 300 MG/ML solution. Multiplanar reformatted images are provided for review. Automated exposure control, iterative reconstruction, and/or weight-based adjustment of the mA/kV was utilized to reduce the radiation dose to as low as reasonably achievable. COMPARISON: None available. CLINICAL HISTORY: Abdominal pain, acute, nonlocalized. 6 weeks postpartum - various complications during birth including hemorrhaging and blood clot in LEFT arm during that time. C/section. Now having same symptoms in RIGHT arm - pain concerned another blood clot is present. Incision site healed from c/section, but patient is still experiencing pain. FINDINGS: LOWER CHEST: No acute abnormality. LIVER: The liver is unremarkable. GALLBLADDER AND BILE DUCTS: Gallbladder is unremarkable. No biliary ductal dilatation. SPLEEN: No acute abnormality. PANCREAS: No acute abnormality. ADRENAL GLANDS: No acute abnormality. KIDNEYS, URETERS AND BLADDER: No stones in the  kidneys or ureters. No hydronephrosis. No perinephric or periureteral stranding. Urinary bladder is unremarkable. GI AND BOWEL: Previous gastric sleeve procedure. Normal appendix right lower quadrant. There is no bowel obstruction. PERITONEUM AND RETROPERITONEUM: No ascites. No free air. VASCULATURE: Aorta is normal in caliber. LYMPH NODES: No lymphadenopathy. REPRODUCTIVE ORGANS: Enlarged uterus consistent with recent gravid state. BONES AND SOFT TISSUES: Stable small umbilical hernia containing mesenteric fat and blood vessels. No bowel herniation. Skin thickening and subcutaneous fat stranding within the lower anterior abdominal wall may reflect postsurgical changes after recent cesarean section versus cellulitis. No fluid collection or abdominal wall abscess. No acute osseous abnormality. IMPRESSION: 1. No acute findings in the abdomen or pelvis. 2. Skin thickening and subcutaneous fat stranding in the lower anterior abdominal wall, which may represent postsurgical change or cellulitis. No fluid collection or abscess. Electronically signed by: Ozell Daring MD 10/29/2023 08:05 PM EDT RP Workstation: [id number]   UE VENOUS DUPLEX (7am - 7pm) Result Date: 10/29/2023 UPPER VENOUS STUDY  Patient Name:  Laura Benson  Date of Exam:   10/29/2023 Medical Rec #: [id number]                  Accession #:    [id number] Date of Birth: 11/06/1990                 Patient Gender: F Patient Age:   36 years Exam Location:  [facility] Procedure:      VAS US  UPPER EXTREMITY VENOUS DUPLEX Referring Phys: Sandralee Tarkington --------------------------------------------------------------------------------  Indications: Pain. 6 weeks post partum. Anticoagulation: Lovenox , however patient abruptly stopped taking it as it was giving her knots in her stomach. Restarted it today since pain in arm continued to increase. Limitations: Body habitus (BMI 68.7). Comparison Study: Prior left upper extremity venous done 09/24/23 that  was                   negative for DVT and positive for acute SVT in the left                   cephalic vein. Performing Technologist: Alberta Lis RVS  Examination Guidelines: A complete evaluation  includes B-mode imaging, spectral Doppler, color Doppler, and power Doppler as needed of all accessible portions of each vessel. Bilateral testing is considered an integral part of a complete examination. Limited examinations for reoccurring indications may be performed as noted.  Right Findings: +----------+------------+---------+-----------+----------+---------------------+ RIGHT     CompressiblePhasicitySpontaneousProperties       Summary        +----------+------------+---------+-----------+----------+---------------------+ IJV           Full       Yes       No                                     +----------+------------+---------+-----------+----------+---------------------+ Subclavian               Yes       No                                     +----------+------------+---------+-----------+----------+---------------------+ Axillary                 Yes       No                                     +----------+------------+---------+-----------+----------+---------------------+ Brachial                 Yes       No                                     +----------+------------+---------+-----------+----------+---------------------+ Radial        Full                                                        +----------+------------+---------+-----------+----------+---------------------+ Ulnar         Full                                                        +----------+------------+---------+-----------+----------+---------------------+ Cephalic    Partial      No        No                Acute AC and distal                                                            upper arm        +----------+------------+---------+-----------+----------+---------------------+ Basilic       Full                                                        +----------+------------+---------+-----------+----------+---------------------+  Left Findings: +----------+------------+---------+-----------+----------+-------+ LEFT      CompressiblePhasicitySpontaneousPropertiesSummary +----------+------------+---------+-----------+----------+-------+ Subclavian               Yes       No                       +----------+------------+---------+-----------+----------+-------+  Summary:  Right: No evidence of deep vein thrombosis in the upper extremity. Findings consistent with partially occlusive, acute superficial vein thrombosis involving the right cephalic vein at the [facility] and distal forearm..  Left: No evidence of thrombosis in the subclavian.  *See table(s) above for measurements and observations.     Preliminary      Procedures   Medications Ordered in the ED  hydrALAZINE  (APRESOLINE ) tablet 50 mg (has no administration in time range)  iohexol  (OMNIPAQUE ) 300 MG/ML solution 100 mL (100 mLs Intravenous Contrast Given 10/29/23 1931)    Clinical Course as of 10/29/23 2027  Thu Oct 29, 2023  1722 CBC with Diff(!) Hemoglobin improving compared to previous [JK]  1814 Comprehensive metabolic panel(!) Metabolic panel normal.  Pregnancy test negative.  Lipase normal [JK]  1830 Doppler study shows small clot in the cephalic vein, no deep venous thrombosis.  Patient is supposed to be on Lovenox .  Will have her continue that medication [JK]  2013 CT scan abdomen pelvis does not show any acute abnormality.  Patient has some skin thickening and subcutaneous fat stranding which may be present postsurgical changes or cellulitis [JK]    Clinical Course User Index [JK] Randol Simmonds, MD                                 Medical Decision Making Problems Addressed: Abdominal pain, unspecified  abdominal location: acute illness or injury that poses a threat to life or bodily functions Hypertension, unspecified type: chronic illness or injury Superficial vein thrombosis: acute illness or injury  Amount and/or Complexity of Data Reviewed Labs: ordered. Decision-making details documented in ED Course. Radiology: ordered and independent interpretation performed.  Risk Prescription drug management.   Patient presented to the ED for evaluation of abdominal pain as well as arm pain.  Patient is status post C-section delivery on August 23.  As far as her abdominal pain, other is no evidence of abscess or other acute intra-abdominal abnormality on CT scan.  Patient does have some thickening of the skin noted on CT scan but clinically her exam does not suggest cellulitis.  I did order urinalysis but the patient was not able provide a sample.  She is not having any burning with urination.  I do not feel that catheterization is necessary.  Patient also was complaining right upper extremity pain.  Ultrasound does not show DVT.  Patient is supposed to be taking Lovenox  although she stopped taking it.  Will have her continue that until it is complete  Patient also noted to be hypertensive.  She does carry a diagnosis of hypertension.  I have low suspicion for postpartum eclampsia at this time.  Patient is on several medications her blood pressure.  Will have her continue her home meds follow-up with her primary doctor or OB to be rechecked.     Final diagnoses:  Superficial vein thrombosis  Abdominal pain, unspecified abdominal location  Hypertension, unspecified type    ED Discharge Orders     None          Randol Simmonds, MD 10/29/23 2029

## 2023-10-29 NOTE — ED Notes (Signed)
 PT provided with labeled urine collection cup.

## 2023-10-29 NOTE — ED Notes (Signed)
 Multiple elevated Bps noted on bilateral arms. Dr. Randol was informed of RN concern for post partum eclampsia.

## 2023-10-30 ENCOUNTER — Telehealth: Payer: Self-pay

## 2023-10-30 NOTE — Telephone Encounter (Signed)
   IMPACT Mom Telephone Follow-Up  10/30/2023   Laura Benson is a 33 y.o. G1P1001 who is 5 weeks postpartum and enrolled in the IMPACT study.  [name] was discharged on 09/25/2023 with the following prescriptions:  IS still taking discharge prescriptions. Attended postpartum follow up on: Yes OR has postpartum follow up scheduled for: Still has a diagnosis of postpartum hypertension? Yes Has OB cardiology follow up scheduled? 01/20/24  Today's BP reading is: Unable to check  Next cardiology appointment is scheduled for: 01/20/24 on waiting list  Message sent to Dr. Aquilla Finder for closer follow up? No Sent to MAU for immediate evaluation? No  Has attended postpartum support group? No Invited to postpartum support group on 11/12/2023  Edinburgh score on hospital discharge was:16 Edinburgh score at postpartum follow up was:18 Has referral for IBH follow up? Yes Message sent to [facility] for [facility] follow up? No  Feeding plan:formula How long was breastfeeding attempted? 4 weeks  If breastfeeding at discharge, but now formula feeding, what was the reason for switching to formula? Feels like baby wasn't getting enough Need LC follow up? No Message sent to [facility] for [facility] scheduling? No  Call completed by Geraldina Louder, BS, IBCLC

## 2023-11-04 ENCOUNTER — Ambulatory Visit: Admitting: Clinical

## 2023-11-04 ENCOUNTER — Encounter: Payer: Self-pay | Admitting: *Deleted

## 2023-11-04 DIAGNOSIS — F331 Major depressive disorder, recurrent, moderate: Secondary | ICD-10-CM | POA: Diagnosis not present

## 2023-11-04 DIAGNOSIS — F411 Generalized anxiety disorder: Secondary | ICD-10-CM

## 2023-11-04 NOTE — BH Specialist Note (Unsigned)
 Integrated Behavioral Health via Telemedicine Visit  11/05/2023 Yena Tisby [id number]  Number of Integrated Behavioral Health Clinician visits: 5-Fifth Visit  Session Start time: 1037   Session End time: 1059  Total time in minutes: 22  Referring Provider: Norleen Rover, MD Patient/Family location: Home [facility] Provider location: Center for [facility] Healthcare at [facility]  All persons participating in visit: [name] and [facility] Amana Bouska   Types of Service: Individual psychotherapy and Video visit  I connected with Suzen Hurman Catarino and/or Suzen Hurman Mckiernan's n/a via  Telephone or Video Enabled Telemedicine Application  (Video is Caregility application) and verified that I am speaking with the correct person using two identifiers. Discussed confidentiality: Yes   I discussed the limitations of telemedicine and the availability of in person appointments.  Discussed there is a possibility of technology failure and discussed alternative modes of communication if that failure occurs.  I discussed that engaging in this telemedicine visit, they consent to the provision of behavioral healthcare and the services will be billed under their insurance.  Patient and/or legal guardian expressed understanding and consented to Telemedicine visit: Yes   Presenting Concerns: Patient and/or family reports the following symptoms/concerns: Primary concerns today are worry about baby's fussiness/gassiness, extended family overstepping boundaries regarding baby, current health (recent blood clot in arm), and going back to third shift work earlier than medical recommendation due to financial stress.  Duration of problem: Postpartum; Severity of problem: moderate  Patient and/or Family's Strengths/Protective Factors: Social connections, Concrete supports in place (healthy food, safe environments, etc.), and Sense of purpose  Goals  Addressed: Patient will:  Reduce symptoms of: anxiety, depression, and stress   Increase knowledge and/or ability of: healthy habits and stress reduction   Demonstrate ability to: Increase healthy adjustment to current life circumstances, Increase adequate support systems for patient/family, and Increase motivation to adhere to plan of care  Progress towards Goals: Ongoing    Interventions: Interventions utilized:  Psychoeducation and/or Health Education, Link to [employer], and Supportive Reflection Standardized Assessments completed: GAD-7 and PHQ 9    Patient and/or Family Response: Patient agrees with treatment plan.   Clinical Assessment/Diagnosis  Moderate episode of recurrent major depressive disorder (HCC)  GAD (generalized anxiety disorder)   Patient may benefit from continued therapeutic intervention  .  Plan: Follow up with behavioral health clinician on : Two weeks Behavioral recommendations:  -Continue taking Wellbutrin  (and other prescribed meds) as prescribed -Contact baby's pediatrician regarding any change in formula/recommendations  -Continue setting healthy boundaries regarding others caring for baby, as agreed-upon by yourself and partner -Continue prioritizing healthy self-care (regular meals, adequate rest; allowing practical help from supportive friends and family)  -Consider new mom support group as needed at either www.[email or web address].net or www.[email or web address].com  -Continue to consider psychiatry as needed in the future -Follow medical recommendations to manage medical concerns  Referral(s): Integrated Art gallery manager (In Clinic) and [employer]:  new parent support  I discussed the assessment and treatment plan with the patient and/or parent/guardian. They were provided an opportunity to ask questions and all were answered. They agreed with the plan and demonstrated an understanding of the instructions.   They were advised  to call back or seek an in-person evaluation if the symptoms worsen or if the condition fails to improve as anticipated.  Warren JAYSON Mering, LCSW     10/19/2023   10:21 AM 10/05/2023   10:43 AM 07/02/2023    2:34 PM 03/24/2023  4:41 PM 12/04/2021   11:23 AM  Depression screen PHQ 2/9  Decreased Interest 0 3 1 1  0  Down, Depressed, Hopeless 1 3 0 1 3  PHQ - 2 Score 1 6 1 2 3   Altered sleeping 0 3 0 1 3  Tired, decreased energy 1 3 1 3 2   Change in appetite 0 3 0 1 0  Feeling bad or failure about yourself  1 3 0 1 3  Trouble concentrating 0 3 0 0 3  Moving slowly or fidgety/restless 0 0 0 0 0  Suicidal thoughts 0 0 0 0   PHQ-9 Score 3 21 2 8 14   Difficult doing work/chores     Not difficult at all      10/19/2023   10:23 AM 10/05/2023   10:50 AM 07/02/2023    2:36 PM 03/24/2023    4:42 PM  GAD 7 : Generalized Anxiety Score  Nervous, Anxious, on Edge 1 3 0 2  Control/stop worrying 3 3 1 1   Worry too much - different things 3 3 1 1   Trouble relaxing 1 3 0 1  Restless 0 1 0 0  Easily annoyed or irritable 1 3 1 1   Afraid - awful might happen 1 3 0 1  Total GAD 7 Score 10 19 3 7        10/22/2023    3:58 PM 09/22/2023    9:39 AM  Edinburgh Postnatal Depression Scale Screening Tool  I have been able to laugh and see the funny side of things. 3 1  I have looked forward with enjoyment to things. 0 0  I have blamed myself unnecessarily when things went wrong. 0 2  I have been anxious or worried for no good reason. 3 2  I have felt scared or panicky for no good reason. 2 2  Things have been getting on top of me. 2 2  I have been so unhappy that I have had difficulty sleeping. 3 2  I have felt sad or miserable. 3 3  I have been so unhappy that I have been crying. 2 2  The thought of harming myself has occurred to me. 0 0  Edinburgh Postnatal Depression Scale Total 18 16

## 2023-11-05 NOTE — Patient Instructions (Signed)
 Center for [facility] Healthcare at [facility] [address] [place], [place] [address] [phone] (main office) [phone] [facility] office)  New Parent Support Groups [email or web address].net [email or web address]            Parenting Resources  Zero to Three [email or web address] for [employer] www.[email or web address]   Circle of Security [email or web address]   Happiest Baby on the Block [email or web address]  The [email or web address]

## 2023-11-09 ENCOUNTER — Ambulatory Visit: Admitting: Physician Assistant

## 2023-11-11 ENCOUNTER — Ambulatory Visit

## 2023-11-17 NOTE — BH Specialist Note (Deleted)
 error

## 2023-11-18 ENCOUNTER — Encounter: Admitting: Clinical

## 2023-11-20 ENCOUNTER — Telehealth: Payer: Self-pay | Admitting: Family Medicine

## 2023-11-20 ENCOUNTER — Ambulatory Visit: Admitting: Obstetrics and Gynecology

## 2023-11-20 NOTE — Telephone Encounter (Signed)
 Patient called us  today to cancel her GYN visit because she is no longer interested. I offered to get her rescheduled but she said that at this moment she would like to cancel without rescheduling.

## 2023-11-24 ENCOUNTER — Encounter: Admitting: Family Medicine

## 2023-11-24 ENCOUNTER — Inpatient Hospital Stay: Admitting: Oncology

## 2023-11-27 ENCOUNTER — Other Ambulatory Visit (HOSPITAL_COMMUNITY): Payer: Self-pay

## 2023-11-27 ENCOUNTER — Ambulatory Visit: Admitting: Pharmacist

## 2023-11-27 VITALS — BP 137/92

## 2023-11-27 DIAGNOSIS — O10919 Unspecified pre-existing hypertension complicating pregnancy, unspecified trimester: Secondary | ICD-10-CM

## 2023-11-27 DIAGNOSIS — I10 Essential (primary) hypertension: Secondary | ICD-10-CM

## 2023-11-27 DIAGNOSIS — Z8759 Personal history of other complications of pregnancy, childbirth and the puerperium: Secondary | ICD-10-CM

## 2023-11-27 DIAGNOSIS — O119 Pre-existing hypertension with pre-eclampsia, unspecified trimester: Secondary | ICD-10-CM

## 2023-11-27 DIAGNOSIS — O0990 Supervision of high risk pregnancy, unspecified, unspecified trimester: Secondary | ICD-10-CM

## 2023-11-27 MED ORDER — PRENATAL 27-1 MG PO TABS
1.0000 | ORAL_TABLET | Freq: Every day | ORAL | 12 refills | Status: AC
  Filled 2023-11-27: qty 30, 30d supply, fill #0
  Filled 2023-12-26 – 2024-01-08 (×2): qty 30, 30d supply, fill #1

## 2023-11-27 MED ORDER — AMLODIPINE BESYLATE 10 MG PO TABS
10.0000 mg | ORAL_TABLET | Freq: Every day | ORAL | 1 refills | Status: AC
  Filled 2023-11-27: qty 90, 90d supply, fill #0

## 2023-11-27 NOTE — Progress Notes (Signed)
 Patient ID: Laura Benson                 DOB: [birth year]                      MRN: [id number]     HPI: Laura Benson is a 33 y.o. female referred to Cardio OB clinic. Gave birth in August. PMH is significant for HTN, DM, OSA, PCOS, and obesity.  Presents today to discuss HTN management. Is no longer breastfeeding.   Reports she has not been taking any of her blood pressure medications. Has not checked her blood pressure at home. Feels overwhelmed by dosing schedule. Is seeing IBH for depression and anxiety.  Eats her meals at home and does most of the cooking. Does not add salt to meals. Drinks tea, diet fruit juices, and water .  Denies swelling and SOB. Reports an ache in her chest recently but it resolved quickly.  Current HTN meds:  Amlodipine  10mg  daily (has not been taking) Enalapril  5mg  once daily (has not been taking) Hydralazine  50mg  TID (has not been taking)  BP goal: <130/90   Wt Readings from Last 3 Encounters:  10/22/23 (!) 399 lb 14.4 oz (181.4 kg)  09/29/23 (!) 399 lb 12.8 oz (181.3 kg)  09/17/23 (!) 426 lb 12.8 oz (193.6 kg)   BP Readings from Last 3 Encounters:  10/29/23 (!) 167/112  10/22/23 112/78  09/29/23 133/78   Pulse Readings from Last 3 Encounters:  10/29/23 78  10/22/23 82  09/29/23 (!) 104    Renal function: CrCl cannot be calculated (Patient's most recent lab result is older than the maximum 21 days allowed.).  Past Medical History:  Diagnosis Date   Anxiety    Asthma    Last used rescue inhaler 6mon ago   Complication of anesthesia    Take for a while to wake up   Depression    Diabetes mellitus without complication (HCC)    Hypertension    Morbid obesity (HCC)    PCOS (polycystic ovarian syndrome)    Sleep apnea    CPAP  last used CPAP 2y ago   Tachycardia     Current Outpatient Medications on File Prior to Visit  Medication Sig Dispense Refill   acetaminophen  (TYLENOL ) 500 MG tablet Take 2 tablets  (1,000 mg total) by mouth every 6 (six) hours. 30 tablet 0   acetaminophen -caffeine (EXCEDRIN  TENSION HEADACHE) 500-65 MG TABS per tablet Take 2 tablets by mouth every 8 (eight) hours as needed. (Patient not taking: Reported on 09/29/2023) 60 tablet 0   albuterol  (VENTOLIN  HFA) 108 (90 Base) MCG/ACT inhaler Inhale 2 puffs into the lungs every 6 (six) hours as needed for wheezing or shortness of breath. 6.7 g 0   amLODipine  (NORVASC ) 10 MG tablet Take 1 tablet (10 mg total) by mouth daily. 90 tablet 3   amoxicillin -clavulanate (AUGMENTIN ) 875-125 MG tablet Take 1 tablet by mouth 2 (two) times daily. 9 tablet 0   buPROPion  (WELLBUTRIN  XL) 300 MG 24 hr tablet Take 1 tablet (300 mg total) by mouth daily. 90 tablet 3   Continuous Glucose Receiver (DEXCOM G7 RECEIVER) DEVI 1 Units by Does not apply route daily. 1 each 1   Continuous Glucose Sensor (DEXCOM G7 SENSOR) MISC Apply one sensor every 10 days ICD10: O24.414 (Gestational diabetes, insulin  dependent) 3 each 12   enalapril  (VASOTEC ) 10 MG tablet Take 0.5 tablets (5 mg total) by mouth daily. 30 tablet 0   enoxaparin  (  LOVENOX ) 100 MG/ML injection Inject 2 mLs (200 mg total) into the skin every 12 (twelve) hours. 168 mL 0   fluticasone  (FLONASE ) 50 MCG/ACT nasal spray Place 2 sprays into both nostrils daily. (Patient not taking: Reported on 09/29/2023) 9.9 mL 0   furosemide  (LASIX ) 40 MG tablet Take 1 tablet (40 mg total) by mouth daily for 5 days. 5 tablet 0   hydrALAZINE  (APRESOLINE ) 50 MG tablet Take 1 tablet (50 mg total) by mouth 3 (three) times daily. 90 tablet 0   Insulin  Disposable Pump (OMNIPOD 5 DEXG7G6 PODS GEN 5) MISC 1 Device by Does not apply route every 3 (three) days. (Patient not taking: Reported on 09/29/2023) 3 each 8   Insulin  Pen Needle (BD PEN NEEDLE NANO 2ND GEN) 32G X 4 MM MISC 1 Units by Does not apply route daily. (Patient not taking: Reported on 09/29/2023) 100 each 1   oxyCODONE  (OXY IR/ROXICODONE ) 5 MG immediate release tablet Take 1  tablet (5 mg total) by mouth every 4 (four) hours as needed for moderate pain (pain score 4-6). (Patient not taking: Reported on 09/29/2023) 12 tablet 0   potassium chloride  SA (KLOR-CON  M) 20 MEQ tablet Take 2 tablets (40 mEq total) by mouth daily. 6 tablet 0   Prenatal 28-0.8 MG TABS Take 1 tablet by mouth daily. 30 tablet 12   promethazine  (PHENERGAN ) 25 MG tablet Take 1 tablet (25 mg total) by mouth every 6 (six) hours as needed for nausea or vomiting. (Patient not taking: Reported on 09/29/2023) 30 tablet 0   senna-docusate (SENOKOT-S) 8.6-50 MG tablet Take 2 tablets by mouth daily. 30 tablet 0   sertraline  (ZOLOFT ) 50 MG tablet Take 1 tablet (50 mg total) by mouth daily. (Patient not taking: Reported on 09/29/2023) 90 tablet 4   No current facility-administered medications on file prior to visit.    No Known Allergies   Assessment/Plan:  1. Hypertension -  Patient BP of 137/92 is above goal of <130/80. However patient admits she has not been compliant with medications. Has f/u with LCSW on 11/3.  Emphasized importance of medication compliance. Will restart medications slowly to reduce risk of adverse effects. May also help patient feel less overwhelmed.   Restart amlodipine  10mg  daily Hold enalapril  5mg  daily for now Hold hydralazine  50mg  TID for now Recheck 3 weeks  Medford Bolk, PharmD, BCACP, CDCES, CPP [facility] [address] [place], [place] [address] Phone: [phone]; Fax: [phone] 11/29/2023 1:47 PM

## 2023-11-29 NOTE — Patient Instructions (Signed)
 SABRA

## 2023-11-30 ENCOUNTER — Ambulatory Visit: Admitting: Clinical

## 2023-11-30 DIAGNOSIS — F331 Major depressive disorder, recurrent, moderate: Secondary | ICD-10-CM | POA: Diagnosis not present

## 2023-11-30 DIAGNOSIS — F411 Generalized anxiety disorder: Secondary | ICD-10-CM

## 2023-11-30 NOTE — BH Specialist Note (Signed)
 Integrated Behavioral Health via Telemedicine Visit  11/30/2023 Laura Benson [id number]  Number of Integrated Behavioral Health Clinician visits: 6-Sixth Visit  Session Start time: 0815   Session End time: 0850  Total time in minutes: 35   Referring Provider: Norleen Rover, MD Patient/Family location: Home [facility] Provider location: Center for [facility] Healthcare at [facility]  All persons participating in visit: [name] and Laura Benson   Types of Service: Individual psychotherapy and Video visit  I connected with Laura Benson and/or Laura Benson's n/a via  Telephone or Video Enabled Telemedicine Application  (Video is Caregility application) and verified that I am speaking with the correct person using two identifiers. Discussed confidentiality: Yes   I discussed the limitations of telemedicine and the availability of in person appointments.  Discussed there is a possibility of technology failure and discussed alternative modes of communication if that failure occurs.  I discussed that engaging in this telemedicine visit, they consent to the provision of behavioral healthcare and the services will be billed under their insurance.  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  Duration of problem: Postpartum ; Severity of problem: moderate  Patient and/or Family's Strengths/Protective Factors: Social  connections, Concrete supports in place (healthy food, safe environments, etc.), and Sense of purpose  Goals Addressed: Patient will:  Reduce symptoms of: anxiety and depression   Increase knowledge and/or ability of: healthy habits   Demonstrate ability to: Increase healthy adjustment to current life circumstances, Increase adequate support systems for patient/family, and Increase motivation to adhere to plan of care  Progress towards Goals: Ongoing   Interventions: Interventions utilized:  Motivational Interviewing and Link to [employer] Assessments completed: GAD-7 and PHQ 9   Patient and/or Family Response: Patient agrees with treatment plan. Clinical Assessment/Diagnosis  Moderate episode of recurrent major depressive disorder (HCC)  GAD (generalized anxiety disorder)   Patient may benefit from continued therapeutic intervention .  Plan: Follow up with behavioral health clinician on : Two weeks Behavioral recommendations:  -Continue taking Wellbutrin  as prescribed (continue using phone reminders for as long as needed) -Consider options for completing laundry (ex. Dad to carry laundry up and down stairs or use laundromat) if unable to climb stairs safely: at least one full load completed this week -Discuss work accommodations and referral with medical provider tomorrow (provider has been notified of concerns) -Continue plan to discuss with employer need for shift change (first shift instead of third) -Begin prioritizing healthy self-care (healthy meals, adequate rest; allowing practical help from supportive friends and family) until at least postpartum medical appointment -Consider new mom support group as needed at either [email or web address].net or [email or web address]   Referral(s): Integrated Art Gallery Manager (In Clinic) and [employer]:  new mom support  I discussed the assessment and treatment plan with the patient and/or  parent/guardian. They were provided an opportunity to ask questions and all were answered. They agreed with the plan and demonstrated an understanding of the instructions.   They  were advised to call back or seek an in-person evaluation if the symptoms worsen or if the condition fails to improve as anticipated.  Laura JAYSON Mering, LCSW     11/30/2023    8:21 AM 10/19/2023   10:21 AM 10/05/2023   10:43 AM 07/02/2023    2:34 PM 03/24/2023    4:41 PM  Depression screen PHQ 2/9  Decreased Interest 1 0 3 1 1   Down, Depressed, Hopeless 3 1 3  0 1  PHQ - 2 Score 4 1 6 1 2   Altered sleeping 1 0 3 0 1  Tired, decreased energy 1 1 3 1 3   Change in appetite 1 0 3 0 1  Feeling bad or failure about yourself  1 1 3  0 1  Trouble concentrating 0 0 3 0 0  Moving slowly or fidgety/restless 0 0 0 0 0  Suicidal thoughts 0 0 0 0 0  PHQ-9 Score 8 3 21 2 8       11/30/2023    8:23 AM 10/19/2023   10:23 AM 10/05/2023   10:50 AM 07/02/2023    2:36 PM  GAD 7 : Generalized Anxiety Score  Nervous, Anxious, on Edge 1 1 3  0  Control/stop worrying 3 3 3 1   Worry too much - different things 1 3 3 1   Trouble relaxing 1 1 3  0  Restless 0 0 1 0  Easily annoyed or irritable 1 1 3 1   Afraid - awful might happen 3 1 3  0  Total GAD 7 Score 10 10 19  3

## 2023-11-30 NOTE — Patient Instructions (Signed)
 Center for [facility] Healthcare at [facility] for Women [address] [place], [place] [address] [phone] (main office) [phone] [facility] office)  New Parent Support Groups [email or web address]

## 2023-12-01 ENCOUNTER — Ambulatory Visit: Admitting: Family Medicine

## 2023-12-01 ENCOUNTER — Other Ambulatory Visit: Payer: Self-pay

## 2023-12-01 VITALS — BP 129/94 | HR 89 | Wt 394.1 lb

## 2023-12-01 DIAGNOSIS — I10 Essential (primary) hypertension: Secondary | ICD-10-CM | POA: Diagnosis not present

## 2023-12-01 DIAGNOSIS — F339 Major depressive disorder, recurrent, unspecified: Secondary | ICD-10-CM

## 2023-12-01 DIAGNOSIS — N3946 Mixed incontinence: Secondary | ICD-10-CM

## 2023-12-01 DIAGNOSIS — Z3009 Encounter for other general counseling and advice on contraception: Secondary | ICD-10-CM

## 2023-12-01 DIAGNOSIS — I82612 Acute embolism and thrombosis of superficial veins of left upper extremity: Secondary | ICD-10-CM | POA: Diagnosis not present

## 2023-12-01 NOTE — Progress Notes (Unsigned)
 MOM+BABY COMBINED CARE GYNECOLOGY OFFICE VISIT NOTE  History:   Laura Benson is a 33 y.o. G1P1001 here today for follow up.  *** Having bladder incontinence Does get some leaking occasionally with coughing or laughing Also having lots of strong urges and sometimes doesn't make it in time Very open to pelvic PT  Cancelled her visit with Dr. Jeralyn to discuss BTL She is mainly worried about getting anesthesia again and going through the terrible experience she had postpartum with her baby (ICU stay, CHF***, etc.) ***reschedule  Is interested in getting her body healthy Wanting to lose weight Interested in referral to weight loss clinic***  Reports her mental health is so so Is taking her wellbutrin  Does not want to change anything right now ***  ***oncology visit coming up on 12/08/2023, has finished her blood thinner shots  ***BP being managed by cardiology  Having financial stressors, would really like to go back to work Needs letter***see note from [place] ***would like these accomodations for one year, needs medical rationale  ***incontinence ***pelvic PT ***BTL--cancelled but why ***Work letter ***MH, f/w [employer]  Health Maintenance Due  Topic Date Due   Hepatitis B Vaccines 19-59 Average Risk (1 of 3 - 19+ 3-dose series) Never done   HPV VACCINES (1 - 3-dose SCDM series) Never done   Diabetic kidney evaluation - Urine ACR  01/22/2017   OPHTHALMOLOGY EXAM  11/27/2017   Pneumococcal Vaccine (2 of 2 - PCV) 10/31/2020   FOOT EXAM  07/02/2022   Influenza Vaccine  Never done   COVID-19 Vaccine (1 - 2025-26 season) Never done    Past Medical History:  Diagnosis Date   Anxiety    Asthma    Last used rescue inhaler 6mon ago   Complication of anesthesia    Take for a while to wake up   Depression    Diabetes mellitus without complication (HCC)    Hypertension    Morbid obesity (HCC)    PCOS (polycystic ovarian syndrome)    Sleep apnea    CPAP   last used CPAP 2y ago   Tachycardia     Past Surgical History:  Procedure Laterality Date   ADENOIDECTOMY  2006   CESAREAN SECTION N/A 09/19/2023   Procedure: CESAREAN DELIVERY;  Surgeon: Cleatus Moccasin, MD;  Location: [facility] LD ORS;  Service: Obstetrics;  Laterality: N/A;   INTRAUTERINE DEVICE (IUD) INSERTION N/A 09/19/2023   Procedure: INSERTION, INTRAUTERINE DEVICE;  Surgeon: Cleatus Moccasin, MD;  Location: [facility] LD ORS;  Service: Obstetrics;  Laterality: N/A;   LAPAROSCOPIC GASTRIC SLEEVE RESECTION N/A 04/16/2020   Procedure: LAPAROSCOPIC GASTRIC SLEEVE RESECTION;  Surgeon: Tanda Locus, MD;  Location: [facility] ORS;  Service: General;  Laterality: N/A;   TONSILLECTOMY  2006   UPPER GI ENDOSCOPY N/A 04/16/2020   Procedure: UPPER GI ENDOSCOPY;  Surgeon: Tanda Locus, MD;  Location: [facility] ORS;  Service: General;  Laterality: N/A;    The following portions of the patient's history were reviewed and updated as appropriate: allergies, current medications, past family history, past medical history, past social history, past surgical history and problem list.   Health Maintenance:   Last pap: Lab Results  Component Value Date   DIAGPAP  03/24/2023    - Negative for intraepithelial lesion or malignancy (NILM)   HPVHIGH Negative 03/24/2023   ***  Last mammogram:  ***  ***reminder message  Review of Systems:  Pertinent items noted in HPI and remainder of comprehensive ROS otherwise negative.  Physical Exam:  BP ROLLEN)  129/94   Pulse 89   Wt (!) 394 lb 1 oz (178.7 kg)   LMP 11/20/2023 (Exact Date)   Breastfeeding No   BMI 63.60 kg/m  CONSTITUTIONAL: Well-developed, well-nourished female in no acute distress.  HEENT:  Normocephalic, atraumatic. External right and left ear normal. No scleral icterus.  NECK: Normal range of motion, supple, no masses noted on observation SKIN: No rash noted. Not diaphoretic. No erythema. No pallor. MUSCULOSKELETAL: Normal range of motion. No edema noted. NEUROLOGIC:  Alert and oriented to person, place, and time. Normal muscle tone coordination.  PSYCHIATRIC: Normal mood and affect. Normal behavior. Normal judgment and thought content. RESPIRATORY: Effort normal, no problems with respiration noted ABDOMEN: No masses noted. No other overt distention noted.  *** PELVIC: {Blank single:19197::Deferred,Normal appearing external genitalia; normal appearing vaginal mucosa and cervix.  No abnormal discharge noted.  Normal uterine size, no other palpable masses, no uterine or adnexal tenderness.}  Labs and Imaging No results found for this or any previous visit (from the past week). No results found.    Assessment and Plan:   Problem List Items Addressed This Visit   None   Routine preventative health maintenance measures emphasized. Please refer to After Visit Summary for other counseling recommendations.   No follow-ups on file.    Total face-to-face time with patient: {Blank single:19197::10,15,20,25,30} minutes.  Over 50% of encounter was spent on counseling and coordination of care.   Donnice CHRISTELLA Carolus, MD/MPH Attending Family Medicine Physician, [facility] for [facility], [facility]

## 2023-12-03 ENCOUNTER — Encounter: Payer: Self-pay | Admitting: Family Medicine

## 2023-12-03 ENCOUNTER — Encounter (INDEPENDENT_AMBULATORY_CARE_PROVIDER_SITE_OTHER): Payer: Self-pay

## 2023-12-03 DIAGNOSIS — Z3009 Encounter for other general counseling and advice on contraception: Secondary | ICD-10-CM | POA: Insufficient documentation

## 2023-12-03 DIAGNOSIS — N3946 Mixed incontinence: Secondary | ICD-10-CM | POA: Insufficient documentation

## 2023-12-03 NOTE — Assessment & Plan Note (Signed)
 Oncology visit coming up on 12/08/2023, has finished her blood thinner shots

## 2023-12-03 NOTE — Assessment & Plan Note (Signed)
 Referral made to [facility] clinic, discussed they are very strict with attendance etc.

## 2023-12-03 NOTE — Assessment & Plan Note (Signed)
 Almost at goal, being managed by cardio OB clinic at present.

## 2023-12-03 NOTE — Assessment & Plan Note (Signed)
 Previously discussed wanting BTL given she got extremely sick with her last delivery including intubation/ICU stay/etc. She cancelled visit with Dr. Jeralyn to discuss BTL due to fears about having a similar difficult post op course. We reviewed that postpartum is a totally different situation and that this is much less likely with a low risk procedure like BTL which will not have the massive fluid shifts of delivery.  She will reschedule visit with Dr. Jeralyn.

## 2023-12-03 NOTE — Assessment & Plan Note (Signed)
 Stable on wellbutrin , no dose change at present

## 2023-12-03 NOTE — Assessment & Plan Note (Signed)
 Referred to pelvic PT, if no improvement will trial medications

## 2023-12-08 ENCOUNTER — Telehealth: Payer: Self-pay

## 2023-12-08 ENCOUNTER — Inpatient Hospital Stay: Payer: MEDICAID | Admitting: Oncology

## 2023-12-08 NOTE — Telephone Encounter (Signed)
 Followed up with patient regarding missed appointment today with Dr. Autumn for New Hematology Appointment. Left a voicemail.

## 2023-12-09 ENCOUNTER — Telehealth: Payer: Self-pay | Admitting: Oncology

## 2023-12-10 ENCOUNTER — Encounter: Admitting: Physician Assistant

## 2023-12-14 ENCOUNTER — Ambulatory Visit: Admitting: Clinical

## 2023-12-14 DIAGNOSIS — F331 Major depressive disorder, recurrent, moderate: Secondary | ICD-10-CM

## 2023-12-14 DIAGNOSIS — F411 Generalized anxiety disorder: Secondary | ICD-10-CM

## 2023-12-14 NOTE — BH Specialist Note (Signed)
 Integrated Behavioral Health via Telemedicine Visit  12/14/2023 Laura Benson [id number]  Number of Integrated Behavioral Health Clinician visits: Additional Visit  Session Start time: 0922   Session End time: 0937  Total time in minutes: 15 (Pt only consents to 15 minutes today, due to insurance complication; pt shows both [employer] with $80 copay and [name])  Referring Provider: Norleen Rover, MD Patient/Family location: Home [facility] Provider location: [facility]  All persons participating in visit: [name] and [facility] [name]   Types of Service: Individual psychotherapy and Video visit  I connected with Laura Benson and/or Laura Benson's n/a via  Telephone or Video Enabled Telemedicine Application  (Video is Caregility application) and verified that I am speaking with the correct person using two identifiers. Discussed confidentiality: Yes   I discussed the limitations of telemedicine and the availability of in person appointments.  Discussed there is a possibility of technology failure and discussed alternative modes of communication if that failure occurs.  I discussed that engaging in this telemedicine visit, they consent to the provision of behavioral healthcare and the services will be billed under their insurance.  Patient and/or legal guardian expressed understanding and consented to Telemedicine visit: Yes   Presenting Concerns: Patient and/or family reports the following symptoms/concerns: Recognizing that she's unable to return to work this soon, after continued physical pain (legs cramping, stomach and back hurting badly after physical exertion, having to walk more slowly and continued inability to hold her bladder); pt has applied for disability. Pt was able to get up and down the stairs to do her laundry, but in considerable pain for days  afterwards. Pt has good support at home from her father; her goal is to be pain-free by six months postpartum, and able to complete tasks without pain.  Duration of problem: Postpartum; Severity of problem: moderate  Patient and/or Family's Strengths/Protective Factors: Social connections, Concrete supports in place (healthy food, safe environments, etc.), and Sense of purpose  Goals Addressed: Patient will:  Reduce symptoms of: anxiety, depression, and pain   Increase knowledge and/or ability of: healthy habits   Demonstrate ability to: Increase healthy adjustment to current life circumstances  Progress towards Goals: Ongoing    Interventions: Interventions utilized:  Motivational Interviewing and Supportive Reflection Standardized Assessments completed: Not Needed  Patient and/or Family Response: Patient agrees with treatment plan.  Clinical Assessment/Diagnosis  Moderate episode of recurrent major depressive disorder (HCC)  GAD (generalized anxiety disorder)   Patient may benefit from continued therapeutic intervention.  Plan: Follow up with behavioral health clinician on : One month Behavioral recommendations:  -Continue taking Wellbutrin  as prescribed (with phone reminders as needed) -Continue plan to contact [place] [employer] to help with disability case -Continue prioritizing overall health and emotional wellbeing; allowing practical support from family while healing -Follow PCP and specialist medical provider(s)  regarding any physical activity recommendations and/or limitations -Continue plan to attend all upcoming medical appointments (new PCP, cardiology, oncology, ob/gyn and physical therapy) -Continue to consider new mom support at [email or web address] for additional support during this time Referral(s): Integrated [facility] Manager (In Clinic) and [employer]:  new mom support  I discussed the assessment and treatment plan with the patient and/or  parent/guardian. They were provided an opportunity to ask questions and all were answered. They agreed with the plan and demonstrated an understanding of the instructions.   They were advised to call back or seek an  in-person evaluation if the symptoms worsen or if the condition fails to improve as anticipated.  Warren JAYSON Mering, LCSW     12/01/2023   10:08 AM 11/30/2023    8:21 AM 10/19/2023   10:21 AM 10/05/2023   10:43 AM 07/02/2023    2:34 PM  Depression screen PHQ 2/9  Decreased Interest 2 1 0 3 1  Down, Depressed, Hopeless 2 3 1 3  0  PHQ - 2 Score 4 4 1 6 1   Altered sleeping 2 1 0 3 0  Tired, decreased energy 2 1 1 3 1   Change in appetite 3 1 0 3 0  Feeling bad or failure about yourself  1 1 1 3  0  Trouble concentrating 2 0 0 3 0  Moving slowly or fidgety/restless 1 0 0 0 0  Suicidal thoughts 0 0 0 0 0  PHQ-9 Score 15  8  3  21  2       Data saved with a previous flowsheet row definition      12/01/2023   10:09 AM 11/30/2023    8:23 AM 10/19/2023   10:23 AM 10/05/2023   10:50 AM  GAD 7 : Generalized Anxiety Score  Nervous, Anxious, on Edge 2 1 1 3   Control/stop worrying 2 3 3 3   Worry too much - different things 2 1 3 3   Trouble relaxing 2 1 1 3   Restless 1 0 0 1  Easily annoyed or irritable 2 1 1 3   Afraid - awful might happen 2 3 1 3   Total GAD 7 Score 13 10 10 19   Anxiety Difficulty Not difficult at all

## 2023-12-16 ENCOUNTER — Ambulatory Visit: Attending: Cardiovascular Disease | Admitting: Pharmacist

## 2023-12-16 ENCOUNTER — Ambulatory Visit: Admitting: Physician Assistant

## 2023-12-16 ENCOUNTER — Encounter: Payer: Self-pay | Admitting: Pharmacist

## 2023-12-16 VITALS — BP 136/90 | HR 90

## 2023-12-16 DIAGNOSIS — I10 Essential (primary) hypertension: Secondary | ICD-10-CM | POA: Diagnosis not present

## 2023-12-16 NOTE — Progress Notes (Signed)
 Patient ID: Jaidyn Usery                 DOB: January 25, 1991                      MRN: [id number]     HPI: Laura Benson is a 33 y.o. female referred to Cardio OB clinic. Gave birth in August. PMH is significant for HTN, DM, OSA, PCOS, and obesity.  At first visit patient revealed she was not taking any of her blood pressure medications due to feeling overwhelmed. Was able to get patient to restart amlodipine  10mg  once daily in the evening. Is still following with IBH for depression and anxiety.  Patient presents today for HTN follow up. Has been compliant with amlodipine  once daily. Has not been checking BP. Still expresses feelings of being overwhelmed. Continues to talk with LCSW monthly.  Is not currently breastfeeding. Is considering starting pumping again.  Works at [employer]. Went back to work for 1 day but could not make it through the day due to exhaustion and lew swelling.   Eats her meals at home and does most of the cooking. Does not add salt to meals. Drinks tea, diet fruit juices, and water . Is walking during the day but is limited by SOB.   Current HTN meds:  Amlodipine  10mg  daily  Enalapril  5mg  once daily (has not been taking) Hydralazine  50mg  TID (has not been taking)  BP goal: <130/90   Wt Readings from Last 3 Encounters:  10/22/23 (!) 399 lb 14.4 oz (181.4 kg)  09/29/23 (!) 399 lb 12.8 oz (181.3 kg)  09/17/23 (!) 426 lb 12.8 oz (193.6 kg)   BP Readings from Last 3 Encounters:  10/29/23 (!) 167/112  10/22/23 112/78  09/29/23 133/78   Pulse Readings from Last 3 Encounters:  10/29/23 78  10/22/23 82  09/29/23 (!) 104    Renal function: CrCl cannot be calculated (Patient's most recent lab result is older than the maximum 21 days allowed.).  Past Medical History:  Diagnosis Date   Anxiety    Asthma    Last used rescue inhaler 6mon ago   Complication of anesthesia    Take for a while to wake up   Depression     Diabetes mellitus without complication (HCC)    Hypertension    Morbid obesity (HCC)    PCOS (polycystic ovarian syndrome)    Sleep apnea    CPAP  last used CPAP 2y ago   Tachycardia     Current Outpatient Medications on File Prior to Visit  Medication Sig Dispense Refill   acetaminophen  (TYLENOL ) 500 MG tablet Take 2 tablets (1,000 mg total) by mouth every 6 (six) hours. 30 tablet 0   acetaminophen -caffeine (EXCEDRIN  TENSION HEADACHE) 500-65 MG TABS per tablet Take 2 tablets by mouth every 8 (eight) hours as needed. (Patient not taking: Reported on 09/29/2023) 60 tablet 0   albuterol  (VENTOLIN  HFA) 108 (90 Base) MCG/ACT inhaler Inhale 2 puffs into the lungs every 6 (six) hours as needed for wheezing or shortness of breath. 6.7 g 0   amLODipine  (NORVASC ) 10 MG tablet Take 1 tablet (10 mg total) by mouth daily. 90 tablet 3   amoxicillin -clavulanate (AUGMENTIN ) 875-125 MG tablet Take 1 tablet by mouth 2 (two) times daily. 9 tablet 0   buPROPion  (WELLBUTRIN  XL) 300 MG 24 hr tablet Take 1 tablet (300 mg total) by mouth daily. 90 tablet 3   Continuous Glucose Receiver (DEXCOM  G7 RECEIVER) DEVI 1 Units by Does not apply route daily. 1 each 1   Continuous Glucose Sensor (DEXCOM G7 SENSOR) MISC Apply one sensor every 10 days ICD10: O24.414 (Gestational diabetes, insulin  dependent) 3 each 12   enalapril  (VASOTEC ) 10 MG tablet Take 0.5 tablets (5 mg total) by mouth daily. 30 tablet 0   enoxaparin  (LOVENOX ) 100 MG/ML injection Inject 2 mLs (200 mg total) into the skin every 12 (twelve) hours. 168 mL 0   fluticasone  (FLONASE ) 50 MCG/ACT nasal spray Place 2 sprays into both nostrils daily. (Patient not taking: Reported on 09/29/2023) 9.9 mL 0   furosemide  (LASIX ) 40 MG tablet Take 1 tablet (40 mg total) by mouth daily for 5 days. 5 tablet 0   hydrALAZINE  (APRESOLINE ) 50 MG tablet Take 1 tablet (50 mg total) by mouth 3 (three) times daily. 90 tablet 0   Insulin  Disposable Pump (OMNIPOD 5 DEXG7G6 PODS GEN 5)  MISC 1 Device by Does not apply route every 3 (three) days. (Patient not taking: Reported on 09/29/2023) 3 each 8   Insulin  Pen Needle (BD PEN NEEDLE NANO 2ND GEN) 32G X 4 MM MISC 1 Units by Does not apply route daily. (Patient not taking: Reported on 09/29/2023) 100 each 1   oxyCODONE  (OXY IR/ROXICODONE ) 5 MG immediate release tablet Take 1 tablet (5 mg total) by mouth every 4 (four) hours as needed for moderate pain (pain score 4-6). (Patient not taking: Reported on 09/29/2023) 12 tablet 0   potassium chloride  SA (KLOR-CON  M) 20 MEQ tablet Take 2 tablets (40 mEq total) by mouth daily. 6 tablet 0   Prenatal 28-0.8 MG TABS Take 1 tablet by mouth daily. 30 tablet 12   promethazine  (PHENERGAN ) 25 MG tablet Take 1 tablet (25 mg total) by mouth every 6 (six) hours as needed for nausea or vomiting. (Patient not taking: Reported on 09/29/2023) 30 tablet 0   senna-docusate (SENOKOT-S) 8.6-50 MG tablet Take 2 tablets by mouth daily. 30 tablet 0   sertraline  (ZOLOFT ) 50 MG tablet Take 1 tablet (50 mg total) by mouth daily. (Patient not taking: Reported on 09/29/2023) 90 tablet 4   No current facility-administered medications on file prior to visit.    No Known Allergies   Assessment/Plan:  1. Hypertension -  Patient BP of 136/90 is above goal of <130/80 on amlodipine  10mg  daily. Discussed and recommended restarting other antihypertensives such as enalapril  but patient is still hesitant due to feeling overwhelmed. Recommended she continue to exercise at home as tolerated and continue to follow up with LCSW. Has follow up with Dr Sheena on 12/24.  Continue amlodipine  10mg  daily F/u with Dr Sheena on 12/24   Chris Jadon Ressler, PharmD, BCACP, CDCES, CPP [facility] [address] [place], [place] [address] Phone: [phone]; Fax: [phone] 11/29/2023 1:47 PM

## 2023-12-16 NOTE — Patient Instructions (Signed)
 It was good seeing you again  Your blood pressure is much improved  Please continue our amlodipine  10mg  in the evening  Continue drinking water  and avoiding salt  Follow up with Dr Sheena on Dec 24th  Please call or message with any questions  Medford Bolk, PharmD, BCACP, CDCES, CPP [facility] [address], [place], [place] [address] Phone: [phone]; Fax: [phone] 12/16/2023 3:54 PM

## 2023-12-23 ENCOUNTER — Telehealth: Payer: Self-pay

## 2023-12-23 NOTE — Telephone Encounter (Signed)
 Called to complete monthly IMPACT Mom call. No answer. Left VM to return call.   Arlo Butt BS, IBCLC

## 2023-12-26 ENCOUNTER — Other Ambulatory Visit (HOSPITAL_COMMUNITY): Payer: Self-pay

## 2023-12-29 ENCOUNTER — Telehealth: Payer: Self-pay

## 2023-12-29 ENCOUNTER — Inpatient Hospital Stay: Payer: MEDICAID | Admitting: Oncology

## 2023-12-29 NOTE — Telephone Encounter (Signed)
 Followed up on missed Hematology appointments today including lab work and with Hematologist, Dr. Autumn. Did leave a voicemail letting the patient know someone would most likely reach out to reschedule the missed appointments.

## 2024-01-06 NOTE — BH Specialist Note (Deleted)
 Integrated Behavioral Health via Telemedicine Visit  01/06/2024 Tamee Battin [id number]  Number of Integrated Behavioral Health Clinician visits: Additional Visit  Session Start time: 0922   Session End time: 0937  Total time in minutes: 15    Referring Provider: *** Patient/Family location: [facility] Provider location: *** All persons participating in visit: *** Types of Service: {CHL AMB TYPE OF SERVICE:[phone]}  I connected with Suzen Hurman Tissue and/or Suzen Hurman Coltrin's {family members:20773} via  Telephone or Video Enabled Telemedicine Application  (Video is Caregility application) and verified that I am speaking with the correct person using two identifiers. Discussed confidentiality: {YES/NO:21197}  I discussed the limitations of telemedicine and the availability of in person appointments.  Discussed there is a possibility of technology failure and discussed alternative modes of communication if that failure occurs.  I discussed that engaging in this telemedicine visit, they consent to the provision of behavioral healthcare and the services will be billed under their insurance.  Patient and/or legal guardian expressed understanding and consented to Telemedicine visit: {YES/NO:21197}  Presenting Concerns: Patient and/or family reports the following symptoms/concerns: *** Duration of problem: ***; Severity of problem: {Mild/Moderate/Severe:20260}  Patient and/or Family's Strengths/Protective Factors: {CHL AMB BH PROTECTIVE FACTORS:[phone]}  Goals Addressed: Patient will:  Reduce symptoms of: {IBH Symptoms:21014056}   Increase knowledge and/or ability of: {IBH Patient Tools:21014057}   Demonstrate ability to: {IBH Goals:21014053}  Progress towards Goals: {CHL AMB BH PROGRESS TOWARDS GOALS:[id number]}    Interventions: Interventions utilized:  {IBH Interventions:21014054} Standardized Assessments completed: {IBH Screening  Tools:21014051}    Patient and/or Family Response: ***  Clinical Assessment/Diagnosis  No diagnosis found.    Assessment: Patient currently experiencing ***.   Patient may benefit from ***.  Plan: Follow up with behavioral health clinician on : *** Behavioral recommendations: *** Referral(s): {IBH Referrals:21014055}  I discussed the assessment and treatment plan with the patient and/or parent/guardian. They were provided an opportunity to ask questions and all were answered. They agreed with the plan and demonstrated an understanding of the instructions.   They were advised to call back or seek an in-person evaluation if the symptoms worsen or if the condition fails to improve as anticipated.  Mikaya Bunner C Hedda Crumbley, LCSW

## 2024-01-07 ENCOUNTER — Ambulatory Visit: Admitting: Obstetrics and Gynecology

## 2024-01-07 ENCOUNTER — Other Ambulatory Visit (HOSPITAL_COMMUNITY): Payer: Self-pay

## 2024-01-07 ENCOUNTER — Other Ambulatory Visit: Payer: Self-pay

## 2024-01-07 VITALS — BP 139/95 | HR 82 | Wt 395.8 lb

## 2024-01-07 DIAGNOSIS — M791 Myalgia, unspecified site: Secondary | ICD-10-CM

## 2024-01-07 DIAGNOSIS — N939 Abnormal uterine and vaginal bleeding, unspecified: Secondary | ICD-10-CM | POA: Diagnosis not present

## 2024-01-07 DIAGNOSIS — Z3009 Encounter for other general counseling and advice on contraception: Secondary | ICD-10-CM

## 2024-01-07 MED ORDER — BACLOFEN 5 MG PO TABS
1.0000 | ORAL_TABLET | Freq: Two times a day (BID) | ORAL | 1 refills | Status: AC | PRN
  Filled 2024-01-07: qty 60, 30d supply, fill #0

## 2024-01-07 NOTE — Progress Notes (Signed)
 "   GYNECOLOGY VISIT  Patient name: Laura Benson MRN [id number]  Date of birth: 1990-04-28 Chief Complaint:   Desires BTL Delivered in aug c/b by resp failure and intubated - consult re: BTL vs. Safest contraceptive option, supposed to use CPAP but doesn't tolerate it; first baby and worried about what would happen again   History:  Laura Benson here to discuss BTL.  Was told it was hard to wake up after weight loss, also had complications with tonsillectomy. Lungs collapsed as well, which occurred during childhood (around 35 -43 years old). She reported being scared that she may not wake up from surgery due to her history. Did not want c-section and then ended up needing a c-section. She reports that she knew that her epidural would not take due to her size and that due to her back she was concerned the epidural wouldn't take. Unable to get back surgery due to current size. Scared about IUD - reviewed options of anti-anxiety medications; reports having issues with bleeding and considering hysterectomy.  Menses are heavy and painful due to PCOS. After birth, menses are very painful and passing a lot of blood clots. Wearing depends, 4-5 days of heavy flow during this time.   Reports gettting magnesium  and then everythign went dark.  Does not want hysterectomy due to the duration of pelvic rest  Reports walking bad and not walking straight. Having pain since Friday - abdomen is tender. When trying to sit up in bed will have pain. Had pain in October, and they couldn't do anythign fo rher    [facility] bs with IUD insertion. Cocnerned about how MH could be affected   Scheduled to see PFPT for bladder issues.  Currently out of work.     The following portions of the patient's history were reviewed and updated as appropriate: allergies, current medications, past family history, past medical history, past social history, past surgical history and problem list.   Health  Maintenance:   Last pap     Component Value Date/Time   DIAGPAP  03/24/2023 1535    - Negative for intraepithelial lesion or malignancy (NILM)   DIAGPAP  02/02/2020 1147    - Negative for intraepithelial lesion or malignancy (NILM)   HPVHIGH Negative 03/24/2023 1535   ADEQPAP  03/24/2023 1535    Satisfactory for evaluation; transformation zone component PRESENT.   ADEQPAP  02/02/2020 1147    Satisfactory for evaluation; transformation zone component PRESENT.    Health Maintenance  Topic Date Due   Hepatitis B Vaccine (1 of 3 - 19+ 3-dose series) Never done   HPV Vaccine (1 - 3-dose SCDM series) Never done   Yearly kidney health urinalysis for diabetes  01/22/2017   Eye exam for diabetics  11/27/2017   Pneumococcal Vaccine (2 of 2 - PCV) 10/31/2020   Complete foot exam   07/02/2022   Flu Shot  Never done   COVID-19 Vaccine (1 - 2025-26 season) Never done   Hemoglobin A1C  03/23/2024   Yearly kidney function blood test for diabetes  10/28/2024   Pap with HPV screening  03/23/2028   DTaP/Tdap/Td vaccine (3 - Td or Tdap) 08/13/2033   Hepatitis C Screening  Completed   HIV Screening  Completed   Meningitis B Vaccine  Aged Out      Review of Systems:  Pertinent items are noted in HPI. Comprehensive review of systems was otherwise negative.   Objective:  Physical Exam BP (!) 139/95  Pulse 82   Wt (!) 395 lb 12.8 oz (179.5 kg)   LMP 12/24/2023 (Exact Date)   Breastfeeding No   BMI 63.88 kg/m    Physical Exam Vitals and nursing note reviewed.  Constitutional:      Appearance: Normal appearance.  HENT:     Head: Normocephalic and atraumatic.  Pulmonary:     Effort: Pulmonary effort is normal.  Abdominal:     Comments: Right sided abdominal tenderness with + carnett Well healed cesarean scar  Musculoskeletal:     Comments: antalgic ambulation with right sided pain. Reports sciatic an using walker  Skin:    General: Skin is warm and dry.  Neurological:      General: No focal deficit present.     Mental Status: She is alert.  Psychiatric:        Mood and Affect: Mood normal.        Behavior: Behavior normal.        Thought Content: Thought content normal.        Judgment: Judgment normal.        Assessment & Plan:  1. Unwanted fertility (Primary) Message sent to Dr. Niels regarding possible preop anesthesia visit/review given complicated history. Ideally, if entry is quick, time under anesthesia can be minimized. Sterilization form signed today. Will use condoms until procedure. Would prefer Feb/Mar for surgery.  - Ambulatory Referral For Surgery Scheduling  2. Myalgia Trial of muscle relaxer for pain. Recommend keeping appt with PT to evaluate gait and back pain.  - Baclofen  5 MG TABS; Take 1 tablet (5 mg total) by mouth 2 (two) times daily as needed.  Dispense: 60 tablet; Refill: 1  3. Abnormal uterine bleeding (AUB) AUB labs for work up as well as US  ordered. Offered LNG-IUD at time of [facility] BS to provide control of bleeding and keep lining thin.  - CBC - Hemoglobin A1c - TSH Rfx on Abnormal to Free T4 - Testosterone ,Free and Total - Prolactin - US  PELVIC COMPLETE WITH TRANSVAGINAL; Future   Carter Quarry, MD Minimally Invasive Gynecologic Surgery [facility], [facility] "

## 2024-01-09 LAB — CBC
Hematocrit: 38.9 % (ref 34.0–46.6)
Hemoglobin: 11.6 g/dL (ref 11.1–15.9)
MCH: 22.8 pg — ABNORMAL LOW (ref 26.6–33.0)
MCHC: 29.8 g/dL — ABNORMAL LOW (ref 31.5–35.7)
MCV: 76 fL — ABNORMAL LOW (ref 79–97)
Platelets: 397 x10E3/uL (ref 150–450)
RBC: 5.09 x10E6/uL (ref 3.77–5.28)
RDW: 14.1 % (ref 11.7–15.4)
WBC: 4.2 x10E3/uL (ref 3.4–10.8)

## 2024-01-09 LAB — TESTOSTERONE,FREE AND TOTAL
Testosterone, Free: 1.4 pg/mL (ref 0.0–4.2)
Testosterone: 34 ng/dL (ref 8–60)

## 2024-01-09 LAB — HEMOGLOBIN A1C
Est. average glucose Bld gHb Est-mCnc: 137 mg/dL
Hgb A1c MFr Bld: 6.4 % — ABNORMAL HIGH (ref 4.8–5.6)

## 2024-01-09 LAB — PROLACTIN: Prolactin: 25.4 ng/mL (ref 4.8–33.4)

## 2024-01-09 LAB — TSH RFX ON ABNORMAL TO FREE T4: TSH: 0.762 u[IU]/mL (ref 0.450–4.500)

## 2024-01-11 ENCOUNTER — Encounter

## 2024-01-11 ENCOUNTER — Encounter: Payer: Self-pay | Admitting: *Deleted

## 2024-01-11 ENCOUNTER — Ambulatory Visit: Payer: MEDICAID | Admitting: Pulmonary Disease

## 2024-01-11 ENCOUNTER — Ambulatory Visit: Payer: Self-pay | Admitting: Obstetrics and Gynecology

## 2024-01-13 ENCOUNTER — Ambulatory Visit

## 2024-01-13 DIAGNOSIS — Z91199 Patient's noncompliance with other medical treatment and regimen due to unspecified reason: Secondary | ICD-10-CM

## 2024-01-13 NOTE — BH Specialist Note (Signed)
 Pt did not arrive to video visit and did not answer the phone; Left HIPPA-compliant message to call back Warren from [employer] for [employer] at [facility] at  [phone] [facility] office).  ?; left MyChart message for patient.  ? ?

## 2024-01-14 ENCOUNTER — Telehealth: Payer: Self-pay

## 2024-01-14 NOTE — Telephone Encounter (Signed)
 Called to complete monthly IMPACT Mom call. No answer. Left VM to return call.   Arlo Butt BS, IBCLC

## 2024-01-18 ENCOUNTER — Ambulatory Visit: Payer: MEDICAID | Admitting: Pulmonary Disease

## 2024-01-19 ENCOUNTER — Other Ambulatory Visit (HOSPITAL_COMMUNITY): Payer: Self-pay

## 2024-01-19 ENCOUNTER — Ambulatory Visit (HOSPITAL_COMMUNITY): Payer: MEDICAID

## 2024-01-20 ENCOUNTER — Ambulatory Visit: Payer: MEDICAID | Admitting: Cardiology

## 2024-01-27 ENCOUNTER — Ambulatory Visit: Admitting: Physician Assistant

## 2024-01-29 ENCOUNTER — Telehealth: Payer: Self-pay | Admitting: Clinical

## 2024-01-29 NOTE — Telephone Encounter (Signed)
Attempt to return pt call; Left HIPPA-compliant message to call back Torrin Crihfield from [facility] at [facility] at  [phone] (Sanjuan Sawa's office).   

## 2024-02-01 ENCOUNTER — Telehealth: Payer: Self-pay | Admitting: Clinical

## 2024-02-01 NOTE — Telephone Encounter (Signed)
"  error  "

## 2024-02-02 NOTE — Progress Notes (Deleted)
 " OUTPATIENT PHYSICAL THERAPY FEMALE PELVIC EVALUATION   Patient Name: Laura Benson MRN: [id number] DOB:February 13, 1990, 34 y.o., female Today's Date: 02/02/2024  END OF SESSION:   Past Medical History:  Diagnosis Date   Anxiety    Asthma    Last used rescue inhaler 6mon ago   Complication of anesthesia    Take for a while to wake up   Depression    Diabetes mellitus without complication (HCC)    Hypertension    Morbid obesity (HCC)    PCOS (polycystic ovarian syndrome)    Sleep apnea    CPAP  last used CPAP 2y ago   Tachycardia    Past Surgical History:  Procedure Laterality Date   ADENOIDECTOMY  2006   CESAREAN SECTION N/A 09/19/2023   Procedure: CESAREAN DELIVERY;  Surgeon: Cleatus Moccasin, MD;  Location: [facility];  Service: Obstetrics;  Laterality: N/A;   INTRAUTERINE DEVICE (IUD) INSERTION N/A 09/19/2023   Procedure: INSERTION, INTRAUTERINE DEVICE;  Surgeon: Cleatus Moccasin, MD;  Location: [facility];  Service: Obstetrics;  Laterality: N/A;   LAPAROSCOPIC GASTRIC SLEEVE RESECTION N/A 04/16/2020   Procedure: LAPAROSCOPIC GASTRIC SLEEVE RESECTION;  Surgeon: Tanda Locus, MD;  Location: [facility];  Service: General;  Laterality: N/A;   TONSILLECTOMY  2006   UPPER GI ENDOSCOPY N/A 04/16/2020   Procedure: UPPER GI ENDOSCOPY;  Surgeon: Tanda Locus, MD;  Location: [facility];  Service: General;  Laterality: N/A;   Patient Active Problem List   Diagnosis Date Noted   Mixed urge and stress incontinence 12/03/2023   Unwanted fertility 12/03/2023   S/P cesarean section 09/25/2023   Superficial venous thrombosis of arm, left 09/24/2023   Postoperative anemia due to acute blood loss 09/22/2023   Pneumonia 09/22/2023   Respiratory distress 09/19/2023   Type 2 diabetes mellitus during pregnancy 09/17/2023   Alpha thalassemia silent carrier 04/08/2023   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   Morbid obesity (HCC) 03/17/2023   MDD (major depressive disorder), recurrent, severe, 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   GAD (generalized anxiety disorder) 10/23/2020   Complication of anesthesia 09/04/2020   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  Rationale for Evaluation and Treatment: Rehabilitation  ONSET DATE: ***  SUBJECTIVE:  SUBJECTIVE STATEMENT: *** Fluid intake:   FUNCTIONAL LIMITATIONS: ***  PERTINENT HISTORY:  Medications for current condition: *** Surgeries: Cesarean section 09/19/23; Laparoscopic gastric sleeve resection 2022 Other: Diabetes; Hypertension; PCOS Sexual abuse: {Yes/No:304960894}  PAIN:  Are you having pain? {yes/no:20286} NPRS scale: ***/10 Pain location: {pelvic pain location:27098}  Pain type: {type:313116} Pain description: {PAIN DESCRIPTION:21022940}   Aggravating factors: *** Relieving factors: ***  PRECAUTIONS: None  RED FLAGS: {PT Red Flags:29287}   WEIGHT BEARING RESTRICTIONS: No  FALLS:  Has patient fallen in last 6 months? {fallsyesno:27318}  OCCUPATION: ***  ACTIVITY LEVEL : ***  PLOF: {PLOF:24004}  PATIENT GOALS: ***   BOWEL MOVEMENT: Pain with bowel movement: {yes/no:20286} Type of bowel movement:{PT BM type:27100} Fully empty  rectum: {No/Yes:304960894} Leakage: {Yes/No:304960894}                                                  Caused by: *** Bowel urgency: *** Pads: {Yes/No:304960894} Fiber supplement/laxative {YES/NO AS:20300}  URINATION: Pain with urination: {yes/no:20286} Fully empty bladder: {Yes/No:304960894}***                                         Post-void dribble: {YES/NO AS:20300} Stream: {PT urination:27102} Urgency: {YES/NO AS:20300} Frequency:during the day ***                                                        Nocturia: {Yes/No:304960894}***   Leakage: {PT leakage:27103} Pads/briefs: {Yes/No:304960894}  INTERCOURSE:  Ability to have vaginal penetration {YES/NO:21197} Pain with intercourse: {pain with intercourse PA:27099} Dryness: {YES/NO AS:20300} Climax: *** Marinoff Scale: ***/3 Lubricant:  PREGNANCY: Vaginal deliveries *** Tearing {Yes***/No:304960894} Episiotomy {YES/NO AS:20300} C-section deliveries *** Currently pregnant {Yes***/No:304960894}  PROLAPSE: {PT prolapse:27101}   OBJECTIVE:  Note: Objective measures were completed at Evaluation unless otherwise noted.  DIAGNOSTIC FINDINGS:  Post-void residual: Voiding Cystourethrogram (VCUG):  Ultrasound: ***  PATIENT SURVEYS:  {rehab surveys:24030}  PFIQ-7: *** UIQ-7 *** CRAIG -7 *** POPIQ-7 *** Female Sexual Function Index (FSFI) Questionnaire ***  COGNITION: Overall cognitive status: {cognition:24006}     SENSATION: Light touch: {intact/deficits:24005}  LUMBAR SPECIAL TESTS:  {lumbar special test:25242}  FUNCTIONAL TESTS:  {Functional tests:24029} Single leg stance:  Rt:  Lt: Sit-up test: Squat: Bed mobility:  GAIT: Assistive device utilized: {Assistive devices:23999} Comments: ***  POSTURE: {posture:25561}   LUMBARAROM/PROM:  A/PROM A/PROM  Eval (% available)  Flexion   Extension   Right lateral flexion   Left lateral flexion   Right rotation   Left rotation    (Blank  rows = not tested)  LOWER EXTREMITY ROM:  {AROM/PROM:27142} ROM Right eval Left eval  Hip flexion    Hip extension    Hip abduction    Hip adduction    Hip internal rotation    Hip external rotation    Knee flexion    Knee extension    Ankle dorsiflexion    Ankle plantarflexion    Ankle inversion    Ankle eversion     (Blank rows = not tested)  LOWER EXTREMITY MMT:  MMT Right eval Left eval  Hip flexion  Hip extension    Hip abduction    Hip adduction    Hip internal rotation    Hip external rotation    Knee flexion    Knee extension    Ankle dorsiflexion    Ankle plantarflexion    Ankle inversion    Ankle eversion     (Blank rows = not tested) PALPATION:  General: ***  Pelvic Alignment: ***  Abdominal: ***  Diastasis: {Yes/No:304960894}*** Distortion: {YES/NO AS:20300}  Breathing: *** Scar tissue: {Yes/No:304960894}*** Active Straight Leg Raise: ***                External Perineal Exam: ***                             Internal Pelvic Floor: ***  Patient confirms identification and approves PT to assess internal pelvic floor and treatment {yes/no:20286} All internal or external pelvic floor assessments and/or treatments are completed with proper hand hygiene and gloves hands. If needed gloves are changed with hand hygiene during patient care time.  PELVIC MMT:   MMT eval  Vaginal   Internal Anal Sphincter   External Anal Sphincter   Puborectalis   (Blank rows = not tested)        TONE: ***  PROLAPSE: ***  TODAY'S TREATMENT:                                                                                                                              DATE: ***  EVAL ***   PATIENT EDUCATION:  Education details: *** Person educated: {Person educated:25204} Education method: {Education Method:25205} Education comprehension: {Education Comprehension:25206}  HOME EXERCISE PROGRAM: ***  ASSESSMENT:  CLINICAL IMPRESSION: Patient is a  *** y.o. *** who was seen today for physical therapy evaluation and treatment for ***.   OBJECTIVE IMPAIRMENTS: {opptimpairments:25111}.   ACTIVITY LIMITATIONS: {activitylimitations:27494}  PARTICIPATION LIMITATIONS: {participationrestrictions:25113}  PERSONAL FACTORS: {Personal factors:25162} are also affecting patient's functional outcome.   REHAB POTENTIAL: {rehabpotential:25112}  CLINICAL DECISION MAKING: {clinical decision making:25114}  EVALUATION COMPLEXITY: {Evaluation complexity:25115}   GOALS: Goals reviewed with patient? {yes/no:20286}  SHORT TERM GOALS: Target date: ***  *** Baseline: Goal status: INITIAL  2.  *** Baseline:  Goal status: INITIAL  3.  *** Baseline:  Goal status: INITIAL  4.  *** Baseline:  Goal status: INITIAL  5.  *** Baseline:  Goal status: INITIAL  6.  *** Baseline:  Goal status: INITIAL  LONG TERM GOALS: Target date: ***  *** Baseline:  Goal status: INITIAL  2.  *** Baseline:  Goal status: INITIAL  3.  *** Baseline:  Goal status: INITIAL  4.  *** Baseline:  Goal status: INITIAL  5.  *** Baseline:  Goal status: INITIAL  6.  *** Baseline:  Goal status: INITIAL  PLAN:  PT FREQUENCY: {rehab frequency:25116}  PT DURATION: {rehab duration:25117}  PLANNED INTERVENTIONS: {rehab planned interventions:25118::97110-Therapeutic exercises,97530- Therapeutic [id number]- Neuromuscular re-education,97535-  Self Rjmz,02859- Manual therapy,Patient/Family education}  PLAN FOR NEXT SESSION: ***   Zakhi Dupre, PT 02/02/2024, 2:28 PM  "

## 2024-02-03 ENCOUNTER — Ambulatory Visit: Payer: MEDICAID | Attending: Family Medicine | Admitting: Physical Therapy

## 2024-02-04 ENCOUNTER — Ambulatory Visit (HOSPITAL_COMMUNITY): Payer: MEDICAID

## 2024-02-05 ENCOUNTER — Emergency Department (HOSPITAL_COMMUNITY): Payer: MEDICAID

## 2024-02-05 ENCOUNTER — Other Ambulatory Visit: Payer: Self-pay

## 2024-02-05 ENCOUNTER — Emergency Department (HOSPITAL_COMMUNITY)
Admission: EM | Admit: 2024-02-05 | Discharge: 2024-02-06 | Disposition: A | Payer: MEDICAID | Attending: Emergency Medicine | Admitting: Emergency Medicine

## 2024-02-05 DIAGNOSIS — I10 Essential (primary) hypertension: Secondary | ICD-10-CM | POA: Diagnosis not present

## 2024-02-05 DIAGNOSIS — J45909 Unspecified asthma, uncomplicated: Secondary | ICD-10-CM | POA: Diagnosis not present

## 2024-02-05 DIAGNOSIS — Z79899 Other long term (current) drug therapy: Secondary | ICD-10-CM | POA: Diagnosis not present

## 2024-02-05 DIAGNOSIS — E119 Type 2 diabetes mellitus without complications: Secondary | ICD-10-CM | POA: Diagnosis not present

## 2024-02-05 DIAGNOSIS — N83201 Unspecified ovarian cyst, right side: Secondary | ICD-10-CM | POA: Insufficient documentation

## 2024-02-05 DIAGNOSIS — K8020 Calculus of gallbladder without cholecystitis without obstruction: Secondary | ICD-10-CM | POA: Diagnosis not present

## 2024-02-05 DIAGNOSIS — R109 Unspecified abdominal pain: Secondary | ICD-10-CM | POA: Diagnosis present

## 2024-02-05 DIAGNOSIS — R0602 Shortness of breath: Secondary | ICD-10-CM | POA: Diagnosis not present

## 2024-02-05 LAB — COMPREHENSIVE METABOLIC PANEL WITH GFR
ALT: 12 U/L (ref 0–44)
AST: 19 U/L (ref 15–41)
Albumin: 4.3 g/dL (ref 3.5–5.0)
Alkaline Phosphatase: 68 U/L (ref 38–126)
Anion gap: 10 (ref 5–15)
BUN: 11 mg/dL (ref 6–20)
CO2: 26 mmol/L (ref 22–32)
Calcium: 9.2 mg/dL (ref 8.9–10.3)
Chloride: 103 mmol/L (ref 98–111)
Creatinine, Ser: 0.88 mg/dL (ref 0.44–1.00)
GFR, Estimated: >60 mL/min
Glucose, Bld: 149 mg/dL — ABNORMAL HIGH (ref 70–99)
Potassium: 4.1 mmol/L (ref 3.5–5.1)
Sodium: 138 mmol/L (ref 135–145)
Total Bilirubin: 0.8 mg/dL (ref 0.0–1.2)
Total Protein: 7.9 g/dL (ref 6.5–8.1)

## 2024-02-05 LAB — CBC
HCT: 41.4 % (ref 36.0–46.0)
Hemoglobin: 12.6 g/dL (ref 12.0–15.0)
MCH: 22.4 pg — ABNORMAL LOW (ref 26.0–34.0)
MCHC: 30.4 g/dL (ref 30.0–36.0)
MCV: 73.5 fL — ABNORMAL LOW (ref 80.0–100.0)
Platelets: 349 K/uL (ref 150–400)
RBC: 5.63 MIL/uL — ABNORMAL HIGH (ref 3.87–5.11)
RDW: 15.4 % (ref 11.5–15.5)
WBC: 9.7 K/uL (ref 4.0–10.5)
nRBC: 0 % (ref 0.0–0.2)

## 2024-02-05 LAB — LIPASE, BLOOD: Lipase: 17 U/L (ref 11–51)

## 2024-02-05 LAB — URINALYSIS, ROUTINE W REFLEX MICROSCOPIC
Bacteria, UA: NONE SEEN
Bilirubin Urine: NEGATIVE
Glucose, UA: NEGATIVE mg/dL
Ketones, ur: NEGATIVE mg/dL
Nitrite: NEGATIVE
Protein, ur: NEGATIVE mg/dL
Specific Gravity, Urine: 1.011 (ref 1.005–1.030)
pH: 5 (ref 5.0–8.0)

## 2024-02-05 LAB — WET PREP, GENITAL
Clue Cells Wet Prep HPF POC: NONE SEEN
Sperm: NONE SEEN
Trich, Wet Prep: NONE SEEN
WBC, Wet Prep HPF POC: <10
Yeast Wet Prep HPF POC: NONE SEEN

## 2024-02-05 LAB — PRO BRAIN NATRIURETIC PEPTIDE: Pro Brain Natriuretic Peptide: <50 pg/mL

## 2024-02-05 LAB — BLOOD GAS, VENOUS
Acid-Base Excess: 1.3 mmol/L (ref 0.0–2.0)
Bicarbonate: 27.6 mmol/L (ref 20.0–28.0)
O2 Saturation: 30 %
Patient temperature: 37
pCO2, Ven: 50 mmHg (ref 44–60)
pH, Ven: 7.35 (ref 7.25–7.43)
pO2, Ven: <31 mmHg — CL (ref 32–45)

## 2024-02-05 LAB — HCG, SERUM, QUALITATIVE: Preg, Serum: NEGATIVE

## 2024-02-05 MED ORDER — IOHEXOL 300 MG/ML  SOLN
100.0000 mL | Freq: Once | INTRAMUSCULAR | Status: AC | PRN
  Administered 2024-02-05: 100 mL via INTRAVENOUS

## 2024-02-05 MED ORDER — FENTANYL CITRATE (PF) 50 MCG/ML IJ SOSY
50.0000 ug | PREFILLED_SYRINGE | Freq: Once | INTRAMUSCULAR | Status: AC
  Administered 2024-02-05: 50 ug via INTRAVENOUS
  Filled 2024-02-05: qty 1

## 2024-02-05 MED ORDER — ONDANSETRON HCL 4 MG/2ML IJ SOLN
4.0000 mg | Freq: Once | INTRAMUSCULAR | Status: AC
  Administered 2024-02-05: 4 mg via INTRAVENOUS
  Filled 2024-02-05: qty 2

## 2024-02-05 NOTE — ED Triage Notes (Addendum)
 SOB and abd pain x 1 hour, pt falling asleep in triage, denies drugs and ETOH, pt becoming more sedated during triage, reports extensive heath hx. Pt reports pink discharge tp EMS prior to leaving home, denies pregnancy, also used inhaler with no relief

## 2024-02-05 NOTE — ED Provider Notes (Signed)
 " [place] AT [facility] Provider Note   CSN: [id number] Arrival date & time: 02/05/24  1716     Patient presents with: Shortness of Breath and Abdominal Pain   Laura Benson is a 34 y.o. female.   Patient is a 34 year old female with history of hypertension, sleep apnea, diabetes.  She also had preeclampsia during her last pregnancy.  She delivered in August.  This was complicated by pulmonary edema requiring intubation.  She says she has not really had much breathing problems since then although from time to time she will get short of breath.  Today she developed some abdominal pain.  She says she has pretty much had abdominal pain daily since she had her C-section in August but seem to get a little worse today.  She has pain in her right upper abdomen as well as in her lower abdomen.  She denies any nausea or vomiting.  She said she got short of breath with the bad pain.  She does not know what is from that or something else.  She has not had any recent cough or cold symptoms.  No fevers.  No associated chest pain.  No leg swelling or calf pain.  No recent fevers.  No urinary symptoms.  She had a little bit of reddish discharge when she wiped this morning after she urinated but no other vaginal discharge or bleeding.       Prior to Admission medications  Medication Sig Start Date End Date Taking? Authorizing Provider  albuterol  (VENTOLIN  HFA) 108 (90 Base) MCG/ACT inhaler Inhale 2 puffs into the lungs every 6 (six) hours as needed for wheezing or shortness of breath. 09/29/23   Eveline Lynwood MATSU, MD  amLODipine  (NORVASC ) 10 MG tablet Take 1 tablet (10 mg total) by mouth daily. 11/27/23   Tobb, Kardie, DO  Baclofen  5 MG TABS Take 1 tablet (5 mg total) by mouth 2 (two) times daily as needed. 01/07/24   Ajewole, Christana, MD  buPROPion  (WELLBUTRIN  XL) 300 MG 24 hr tablet Take 1 tablet (300 mg total) by mouth daily. 10/22/23   [name], MD   Prenatal 27-1 MG TABS Take 1 tablet by mouth daily. 11/27/23   Tobb, Kardie, DO    Allergies: Patient has no known allergies.    Review of Systems  Constitutional:  Negative for chills, diaphoresis, fatigue and fever.  HENT:  Negative for congestion, rhinorrhea and sneezing.   Eyes: Negative.   Respiratory:  Positive for shortness of breath. Negative for cough and chest tightness.   Cardiovascular:  Negative for chest pain and leg swelling.  Gastrointestinal:  Positive for abdominal pain. Negative for diarrhea, nausea and vomiting.  Genitourinary:  Negative for difficulty urinating, flank pain, frequency, vaginal bleeding and vaginal discharge.  Musculoskeletal:  Negative for arthralgias and back pain.  Skin:  Negative for rash.  Neurological:  Negative for dizziness, speech difficulty, weakness, numbness and headaches.    Updated Vital Signs BP (!) 169/110 (BP Location: Left Arm)   Pulse (!) 104   Temp 99 F (37.2 C) (Oral)   Resp (!) 32   LMP 12/24/2023 (Exact Date)   SpO2 100%   Physical Exam Constitutional:      Appearance: She is well-developed.  HENT:     Head: Normocephalic and atraumatic.  Eyes:     Pupils: Pupils are equal, round, and reactive to light.  Cardiovascular:     Rate and Rhythm: Normal rate and regular rhythm.  Heart sounds: Normal heart sounds.  Pulmonary:     Effort: Pulmonary effort is normal. No respiratory distress.     Breath sounds: Normal breath sounds. No wheezing or rales.  Chest:     Chest wall: No tenderness.  Abdominal:     General: Bowel sounds are normal.     Palpations: Abdomen is soft.     Tenderness: There is abdominal tenderness (Positive tenderness which is mostly generalized but more focused in the right upper quadrant and across the lower abdomen). There is no guarding or rebound.  Musculoskeletal:        General: Normal range of motion.     Cervical back: Normal range of motion and neck supple.     Comments: No pitting  edema or calf tenderness  Lymphadenopathy:     Cervical: No cervical adenopathy.  Skin:    General: Skin is warm and dry.     Findings: No rash.  Neurological:     Mental Status: She is alert and oriented to person, place, and time.     (all labs ordered are listed, but only abnormal results are displayed) Labs Reviewed  COMPREHENSIVE METABOLIC PANEL WITH GFR - Abnormal; Notable for the following components:      Result Value   Glucose, Bld 149 (*)    All other components within normal limits  CBC - Abnormal; Notable for the following components:   RBC 5.63 (*)    MCV 73.5 (*)    MCH 22.4 (*)    All other components within normal limits  URINALYSIS, ROUTINE W REFLEX MICROSCOPIC - Abnormal; Notable for the following components:   APPearance HAZY (*)    Hgb urine dipstick MODERATE (*)    Leukocytes,Ua SMALL (*)    All other components within normal limits  BLOOD GAS, VENOUS - Abnormal; Notable for the following components:   pO2, Ven <31 (*)    All other components within normal limits  WET PREP, GENITAL  LIPASE, BLOOD  HCG, SERUM, QUALITATIVE  PRO BRAIN NATRIURETIC PEPTIDE  GC/CHLAMYDIA PROBE AMP ([facility]isa) NOT AT [facility]    EKG: EKG Interpretation Date/Time:  Friday February 05 2024 18:03:45 EST Ventricular Rate:  91 PR Interval:  151 QRS Duration:  90 QT Interval:  341 QTC Calculation: 420 R Axis:   65  Text Interpretation: Sinus rhythm Nonspecific T abnormalities, diffuse leads since last tracing no significant change Confirmed by Lenor Hollering [phone]) on 02/05/2024 6:55:34 PM  Radiology: US  PELVIC COMPLETE W TRANSVAGINAL AND TORSION R/O Result Date: 02/05/2024 EXAM: US  Pelvis, Complete Transvaginal and Transabdominal without Doppler TECHNIQUE: Transabdominal and transvaginal pelvic duplex ultrasound using B-mode/gray scaled imaging without Doppler spectral analysis and color flow was obtained. COMPARISON: None provided CLINICAL HISTORY: [id number] Ovarian cyst [id number]  Ovarian cyst. FINDINGS: CERVIX: Trace fluid in the cervix. UTERUS: Uterus measures 10.3 x 5.2 x 5.3 cm, volume 138 ml. The uterus is retroverted. Uterus demonstrates normal myometrial echotexture. ENDOMETRIAL STRIPE: Endometrium measures 10 mm. Endometrial stripe is within normal limits. RIGHT OVARY: The right ovary is not visualized due to body habitus and likely overlying cyst. There is a simple right adnexa cyst measuring 8 x 5.1 x 6.3 cm. LEFT OVARY: The left ovary is not visualized due to body habitus. FREE FLUID: No free fluid. IMPRESSION: 1. Probable benign simple right adnexal cyst measuring 8 x 5.1 x 6.3 cm, with follow-up ultrasound recommended in 3-6 months. 2. Left ovary not visualized due to body habitus. 3. Right ovary not visualized due  to body habitus and likely overlying cyst. Electronically signed by: Kate Plummer MD MD 02/05/2024 10:56 PM EST RP Workstation: [id number]   CT ABDOMEN PELVIS W CONTRAST Result Date: 02/05/2024 CLINICAL DATA:  Abdominal pain EXAM: CT ABDOMEN AND PELVIS WITH CONTRAST TECHNIQUE: Multidetector CT imaging of the abdomen and pelvis was performed using the standard protocol following bolus administration of intravenous contrast. RADIATION DOSE REDUCTION: This exam was performed according to the departmental dose-optimization program which includes automated exposure control, adjustment of the mA and/or kV according to patient size and/or use of iterative reconstruction technique. CONTRAST:  OMNIPAQUE  IOHEXOL  300 MG/ML  SOLN COMPARISON:  10/29/2023 FINDINGS: Lower chest: Nodular airspace disease within the medial left lower lobe reference image 5/3 likely reflects focal inflammation or infection, and is new since prior study. Hepatobiliary: Cholelithiasis without evidence of acute cholecystitis. The liver is unremarkable. No biliary duct dilation. No evidence of choledocholithiasis. Pancreas: Unremarkable. No pancreatic ductal dilatation or surrounding inflammatory  changes. Spleen: Normal in size without focal abnormality. Adrenals/Urinary Tract: Adrenal glands are unremarkable. Kidneys are normal, without renal calculi, focal lesion, or hydronephrosis. Bladder is decompressed, limiting its evaluation. Stomach/Bowel: Prior bariatric surgery. No bowel obstruction or ileus. Normal appendix right lower quadrant. No bowel wall thickening or inflammatory change. Vascular/Lymphatic: No significant vascular findings are present. No enlarged abdominal or pelvic lymph nodes. Reproductive: Right adnexa is enlarged, measuring 4.2 x 5.9 x 6.5 cm. Mild surrounding fat stranding and free fluid. Differential diagnosis could include inflammatory/infectious etiologies such as tubo-ovarian abscess, versus ovarian torsion. Further evaluation with pelvic ultrasound is recommended. The uterus and left adnexa appear unremarkable. Other: Trace free fluid within the right hemiabdomen and lower pelvis. No free intraperitoneal gas. No abdominal wall hernia. Musculoskeletal: No acute or destructive bony abnormalities. Reconstructed images demonstrate no additional findings. IMPRESSION: 1. Asymmetric enlargement of the right adnexa with adjacent fat stranding and trace free fluid. Differential diagnosis could include inflammatory/infectious etiologies versus is ovarian torsion. Further evaluation with pelvic ultrasound is recommended. 2. Cholelithiasis without evidence of acute cholecystitis. 3. Nodular airspace disease within the left lower lobe, likely focal inflammation or infection. Electronically Signed   By: Ozell Daring M.D.   On: 02/05/2024 21:07   DG Chest 2 View Result Date: 02/05/2024 EXAM: 2 VIEW(S) XRAY OF THE CHEST 02/05/2024 06:24:00 PM COMPARISON: 09/25/2023 CLINICAL HISTORY: sob FINDINGS: LUNGS AND PLEURA: Low lung volumes. No focal pulmonary opacity. No pleural effusion. No pneumothorax. HEART AND MEDIASTINUM: No acute abnormality of the cardiac and mediastinal silhouettes. BONES  AND SOFT TISSUES: No acute osseous abnormality. IMPRESSION: 1. Low lung volumes. Electronically signed by: Dorethia Molt MD MD 02/05/2024 06:54 PM EST RP Workstation: [id number]     Procedures   Medications Ordered in the ED  fentaNYL  (SUBLIMAZE ) injection 50 mcg (50 mcg Intravenous Given 02/05/24 1925)  ondansetron  (ZOFRAN ) injection 4 mg (4 mg Intravenous Given 02/05/24 1925)  iohexol  (OMNIPAQUE ) 300 MG/ML solution 100 mL (100 mLs Intravenous Contrast Given 02/05/24 2037)  fentaNYL  (SUBLIMAZE ) injection 50 mcg (50 mcg Intravenous Given 02/05/24 2333)                                    Medical Decision Making Amount and/or Complexity of Data Reviewed Labs: ordered. Radiology: ordered.  Risk Prescription drug management.   This patient presents to the ED for concern of abdominal pain, this involves an extensive number of treatment options, and is a complaint that carries  with it a high risk of complications and morbidity.  I considered the following differential and admission for this acute, potentially life threatening condition.  The differential diagnosis includes gallbladder disease, appendicitis, bowel obstruction, colitis, ovarian torsion, ovarian cyst, endometriosis, fibroids, infection, pyelonephritis  MDM:    Patient is a 34 year old who presents with abdominal pain.  She said she has had it chronically for the last few months but it got worse today.  She has known gallstones and says she has pain in that upper abdomen.  She also has pain across her lower abdomen which she says she has had since August.  However it seems to be worse today.  She denies any vaginal discharge.  No urinary symptoms.  No fevers.  No vomiting.  She did get a little short of breath today but says it was related to the pain.  No cough or cold symptoms.  Her labs are nonconcerning.  She is a little bit tachycardic.  Was given some medicine for pain and some IV fluids.  CT scan shows cholelithiasis without signs  of cholecystitis.  There is also a cystic structure on the right adnexa.  There are some stranding around the right ovary concerning for possible ovarian torsion or other etiology.  Pelvic ultrasound was performed.  I discussed these findings with the radiologist.  She was not able to see the right ovary.  She cannot rule out torsion based on the exam.  Discussed with OB/GYN, Dr. Zina who recommends trying to repeat the ultrasound.  Discussed with ultrasound tech and they will take the patient over to the ultrasound suite and try to get better images.  Care turned over to Dr. Trine pending repeat ultrasound.  She does not have any ongoing shortness of breath.  Chest x-ray does not show any signs of pneumonia or other concerns.  No pulmonary edema.  She does not have hypoxia, chest pain or other symptoms that would more concerning for PE.  Her BNP is normal.  Her CT scan questioned area of inflammation versus infiltrate in her left lung.  However she is not having any symptoms that sound more concerning for pneumonia.  (Labs, imaging, consults)  Labs: I Ordered, and personally interpreted labs.  The pertinent results include: Normal WBC count, negative pregnancy, normal BNP,  Imaging Studies ordered: I ordered imaging studies including pelvic ultrasound, CT abdomen pelvis I independently visualized and interpreted imaging. I agree with the radiologist interpretation  Additional history obtained from  .  External records from outside source obtained and reviewed including prior notes  Cardiac Monitoring: The patient was maintained on a cardiac monitor.  If on the cardiac monitor, I personally viewed and interpreted the cardiac monitored which showed an underlying rhythm of: Sinus tachycardia  Reevaluation: After the interventions noted above, I reevaluated the patient and found that they have :improved  Social Determinants of Health:    Disposition: Pending  Co morbidities that complicate  the patient evaluation  Past Medical History:  Diagnosis Date   Anxiety    Asthma    Last used rescue inhaler 6mon ago   Complication of anesthesia    Take for a while to wake up   Depression    Diabetes mellitus without complication (HCC)    Hypertension    Morbid obesity (HCC)    PCOS (polycystic ovarian syndrome)    Sleep apnea    CPAP  last used CPAP 2y ago   Tachycardia      Medicines Meds ordered  this encounter  Medications   fentaNYL  (SUBLIMAZE ) injection 50 mcg   ondansetron  (ZOFRAN ) injection 4 mg   iohexol  (OMNIPAQUE ) 300 MG/ML solution 100 mL   fentaNYL  (SUBLIMAZE ) injection 50 mcg    I have reviewed the patients home medicines and have made adjustments as needed  Problem List / ED Course: Problem List Items Addressed This Visit   None Visit Diagnoses       Abdominal pain, unspecified abdominal location    -  Primary     Calculus of gallbladder without cholecystitis without obstruction         Cyst of right ovary                    Final diagnoses:  Abdominal pain, unspecified abdominal location  Calculus of gallbladder without cholecystitis without obstruction  Cyst of right ovary    ED Discharge Orders     None          Lenor Hollering, MD 02/05/24 2350  "

## 2024-02-06 LAB — RESP PANEL BY RT-PCR (RSV, FLU A&B, COVID)  RVPGX2
Influenza A by PCR: NEGATIVE
Influenza B by PCR: NEGATIVE
Resp Syncytial Virus by PCR: NEGATIVE
SARS Coronavirus 2 by RT PCR: NEGATIVE

## 2024-02-06 MED ORDER — ACETAMINOPHEN 500 MG PO TABS
1000.0000 mg | ORAL_TABLET | Freq: Once | ORAL | Status: AC
  Administered 2024-02-06: 1000 mg via ORAL
  Filled 2024-02-06: qty 2

## 2024-02-06 MED ORDER — KETOROLAC TROMETHAMINE 15 MG/ML IJ SOLN
15.0000 mg | Freq: Once | INTRAMUSCULAR | Status: AC
  Administered 2024-02-06: 15 mg via INTRAVENOUS
  Filled 2024-02-06: qty 1

## 2024-02-06 MED ORDER — NAPROXEN 500 MG PO TABS: 500.0000 mg | ORAL_TABLET | Freq: Two times a day (BID) | ORAL | 0 refills | Status: AC

## 2024-02-06 MED ORDER — FENTANYL CITRATE (PF) 50 MCG/ML IJ SOSY
50.0000 ug | PREFILLED_SYRINGE | Freq: Once | INTRAMUSCULAR | Status: AC
  Administered 2024-02-06: 50 ug via INTRAVENOUS
  Filled 2024-02-06: qty 1

## 2024-02-06 NOTE — ED Provider Notes (Signed)
 I assumed care of this patient from previous provider.  Please see their note for further details of history, exam, and MDM.   Briefly patient is a 34 y.o. female who presented right sided abd pain pending repeat pelvic US  to rule out torsion since there was poor visualization on prior US . Rest of W/u reassuring including CT.  US  with hydrosalpinx. No torsion. Patient developed fever and tachycardia during stay. No signs of UTI, PNA. Resp panel negative for COVID/Flu/RSV  Given tylenol  and toradol .  The patient appears reasonably screened and/or stabilized for discharge and I doubt any other medical condition or other [facility] requiring further screening, evaluation, or treatment in the ED at this time. I have discussed the findings, Dx and Tx plan with the patient/family who expressed understanding and agree(s) with the plan. Discharge instructions discussed at length. The patient/family was given strict return precautions who verbalized understanding of the instructions. No further questions at time of discharge.  Disposition: Discharge  Condition: Good  ED Discharge Orders          Ordered    naproxen  (NAPROSYN ) 500 MG tablet  2 times daily        02/06/24 0533            { Follow Up: [facility] [address] [place]  [address] [phone] Schedule an appointment as soon as possible for a visit    Surgery, [facility] [address] [place] [address] [phone]  Schedule an appointment as soon as possible for a visit            Ronin Rehfeldt, Raynell Moder, MD 02/06/24 [phone]

## 2024-02-08 LAB — GC/CHLAMYDIA PROBE AMP (~~LOC~~) NOT AT ARMC
Chlamydia: NEGATIVE
Comment: NEGATIVE
Comment: NORMAL
Neisseria Gonorrhea: NEGATIVE

## 2024-02-10 ENCOUNTER — Telehealth: Payer: Self-pay | Admitting: Obstetrics and Gynecology

## 2024-02-10 ENCOUNTER — Encounter: Payer: Self-pay | Admitting: Obstetrics and Gynecology

## 2024-02-10 DIAGNOSIS — N739 Female pelvic inflammatory disease, unspecified: Secondary | ICD-10-CM

## 2024-02-10 DIAGNOSIS — R509 Fever, unspecified: Secondary | ICD-10-CM

## 2024-02-10 NOTE — Telephone Encounter (Signed)
 Called, no answer, LVM and will send mychart message

## 2024-02-11 ENCOUNTER — Telehealth: Payer: Self-pay | Admitting: Obstetrics and Gynecology

## 2024-02-11 ENCOUNTER — Ambulatory Visit: Payer: MEDICAID | Admitting: Pulmonary Disease

## 2024-02-11 MED ORDER — DOXYCYCLINE HYCLATE 100 MG PO CAPS: 100.0000 mg | ORAL_CAPSULE | Freq: Two times a day (BID) | ORAL | 0 refills | Status: AC

## 2024-02-11 MED ORDER — METRONIDAZOLE 500 MG PO TABS: 500.0000 mg | ORAL_TABLET | Freq: Two times a day (BID) | ORAL | 0 refills | Status: AC

## 2024-02-11 NOTE — Telephone Encounter (Addendum)
 Pt responded via Mychart to Dr. Jeralyn last evening. Dr. Jeralyn has not yet read the message. I attempted to contact pt to further discuss her symptoms and she did not answer.

## 2024-02-11 NOTE — Telephone Encounter (Signed)
 Spoke with the patient about scheduling with Dr. Jeralyn. Laura Benson reported significant pain with even light touch. Dr. Jonathan requested she be contacted  to assess symptoms. She is scheduled for an appointment in February, if appointment not needed we will cancel. She stated she understood. Message sent to clinical staff.

## 2024-02-26 ENCOUNTER — Telehealth: Payer: Self-pay

## 2024-02-26 NOTE — Telephone Encounter (Signed)
 Called to complete monthly IMPACT Mom call. No answer. Left VM to return call.   Arlo Butt BS, IBCLC

## 2024-03-14 ENCOUNTER — Ambulatory Visit: Admitting: Family Medicine

## 2024-03-18 ENCOUNTER — Ambulatory Visit: Payer: Self-pay | Admitting: Obstetrics and Gynecology

## 2024-03-21 ENCOUNTER — Institutional Professional Consult (permissible substitution) (INDEPENDENT_AMBULATORY_CARE_PROVIDER_SITE_OTHER): Payer: MEDICAID | Admitting: Physician Assistant
